# Patient Record
Sex: Male | Born: 1955 | Race: White | Hispanic: No | Smoking: Never smoker
Health system: Southern US, Community
[De-identification: ages and names within clinical notes are randomized; demographics above are authoritative.]

## PROBLEM LIST (undated history)

## (undated) DIAGNOSIS — G893 Neoplasm related pain (acute) (chronic): Secondary | ICD-10-CM

## (undated) DIAGNOSIS — I10 Essential (primary) hypertension: Secondary | ICD-10-CM

## (undated) DIAGNOSIS — M1990 Unspecified osteoarthritis, unspecified site: Secondary | ICD-10-CM

## (undated) DIAGNOSIS — E785 Hyperlipidemia, unspecified: Secondary | ICD-10-CM

## (undated) DIAGNOSIS — G20A1 Parkinson's disease without dyskinesia, without mention of fluctuations: Secondary | ICD-10-CM

## (undated) DIAGNOSIS — F419 Anxiety disorder, unspecified: Secondary | ICD-10-CM

## (undated) DIAGNOSIS — D649 Anemia, unspecified: Secondary | ICD-10-CM

## (undated) DIAGNOSIS — G5600 Carpal tunnel syndrome, unspecified upper limb: Secondary | ICD-10-CM

## (undated) DIAGNOSIS — F32A Depression, unspecified: Secondary | ICD-10-CM

## (undated) DIAGNOSIS — K08199 Complete loss of teeth due to other specified cause, unspecified class: Secondary | ICD-10-CM

## (undated) DIAGNOSIS — E119 Type 2 diabetes mellitus without complications: Secondary | ICD-10-CM

## (undated) DIAGNOSIS — G20 Parkinson's disease: Secondary | ICD-10-CM

## (undated) HISTORY — PX: MANDIBLE FRACTURE SURGERY: SHX706

## (undated) HISTORY — DX: Carpal tunnel syndrome, unspecified upper limb: G56.00

## (undated) HISTORY — PX: KNEE ARTHROSCOPY: SUR90

## (undated) HISTORY — DX: Neoplasm related pain (acute) (chronic): G89.3

## (undated) HISTORY — DX: Hyperlipidemia, unspecified: E78.5

## (undated) HISTORY — DX: Type 2 diabetes mellitus without complications: E11.9

## (undated) HISTORY — DX: Essential (primary) hypertension: I10

---

## 2003-11-19 DIAGNOSIS — G510 Bell's palsy: Secondary | ICD-10-CM | POA: Insufficient documentation

## 2010-11-09 DEATH — deceased

## 2012-10-26 DIAGNOSIS — L219 Seborrheic dermatitis, unspecified: Secondary | ICD-10-CM | POA: Insufficient documentation

## 2012-10-26 DIAGNOSIS — F419 Anxiety disorder, unspecified: Secondary | ICD-10-CM | POA: Insufficient documentation

## 2012-10-26 DIAGNOSIS — F4010 Social phobia, unspecified: Secondary | ICD-10-CM | POA: Insufficient documentation

## 2017-04-23 DIAGNOSIS — D0461 Carcinoma in situ of skin of right upper limb, including shoulder: Secondary | ICD-10-CM | POA: Insufficient documentation

## 2018-06-27 DIAGNOSIS — L309 Dermatitis, unspecified: Secondary | ICD-10-CM | POA: Insufficient documentation

## 2019-03-15 DIAGNOSIS — G5601 Carpal tunnel syndrome, right upper limb: Secondary | ICD-10-CM | POA: Insufficient documentation

## 2019-05-31 DIAGNOSIS — E785 Hyperlipidemia, unspecified: Secondary | ICD-10-CM | POA: Insufficient documentation

## 2019-05-31 DIAGNOSIS — Z872 Personal history of diseases of the skin and subcutaneous tissue: Secondary | ICD-10-CM | POA: Insufficient documentation

## 2019-05-31 DIAGNOSIS — N521 Erectile dysfunction due to diseases classified elsewhere: Secondary | ICD-10-CM | POA: Insufficient documentation

## 2019-10-30 ENCOUNTER — Other Ambulatory Visit: Payer: Self-pay | Admitting: Physical Medicine & Rehabilitation

## 2019-10-30 DIAGNOSIS — M546 Pain in thoracic spine: Secondary | ICD-10-CM

## 2019-10-30 DIAGNOSIS — M5414 Radiculopathy, thoracic region: Secondary | ICD-10-CM

## 2019-10-30 DIAGNOSIS — G8929 Other chronic pain: Secondary | ICD-10-CM

## 2019-11-11 ENCOUNTER — Ambulatory Visit: Payer: Self-pay

## 2019-12-14 DIAGNOSIS — M542 Cervicalgia: Secondary | ICD-10-CM | POA: Insufficient documentation

## 2019-12-14 DIAGNOSIS — M5412 Radiculopathy, cervical region: Secondary | ICD-10-CM | POA: Insufficient documentation

## 2020-01-18 DIAGNOSIS — M4804 Spinal stenosis, thoracic region: Secondary | ICD-10-CM | POA: Insufficient documentation

## 2020-03-06 DIAGNOSIS — F331 Major depressive disorder, recurrent, moderate: Secondary | ICD-10-CM | POA: Insufficient documentation

## 2020-08-21 LAB — COLOGUARD: COLOGUARD: NEGATIVE

## 2020-09-18 ENCOUNTER — Other Ambulatory Visit: Payer: Self-pay | Admitting: Thoracic Surgery

## 2020-09-18 DIAGNOSIS — M5136 Other intervertebral disc degeneration, lumbar region: Secondary | ICD-10-CM

## 2020-09-23 NOTE — Progress Notes (Signed)
Virtual Visit via Video Note  I connected with Ricky [name] on 09/24/20 at  2:00 PM EDT by a video enabled telemedicine application and verified that I am speaking with the correct person using two identifiers.  Location: Patient: home Provider: office Persons participated in the visit- patient, provider   I discussed the limitations of evaluation and management by telemedicine and the availability of in person appointments. The patient expressed understanding and agreed to proceed.   I discussed the assessment and treatment plan with the patient. The patient was provided an opportunity to ask questions and all were answered. The patient agreed with the plan and demonstrated an understanding of the instructions.   The patient was advised to call back or seek an in-person evaluation if the symptoms worsen or if the condition fails to improve as anticipated.  I provided 45 minutes of non-face-to-face time during this encounter.   Neysa Hotter, MD     Psychiatric Initial Adult Assessment   Patient Identification: Ricky [name] MRN:  [id number] Date of Evaluation:  09/24/2020 Referral Source: [name], MD Chief Complaint:   Chief Complaint    Follow-up; Depression    "I'm depressed all my life" Visit Diagnosis:    ICD-10-CM   1. MDD (major depressive disorder), recurrent episode, moderate (HCC)  F33.1     History of Present Illness:   [name] is a 65 y.o. year old male with a history of depression, anxiety, diabetes, hypertension, hyperlipidemia, carpal tunnel syndrome, s/p knee arthroscopy who is referred for depression.   Ricky [name], his fiance presents to the interview. Ricky [name] helps him to elaborate his history.  Ricky [name] states that Ricky [name] has been suffering from depression since 1979. (Ricky [name] later states that Ricky [name] is suffering from depression since born)  It got worse lately, although Ricky [name] cannot tell the reason.  Ricky [name] tends to stay in the house as Ricky [name] does not want to do anything.  Ricky [name] also  complains of back, knee pain, and pain secondary to carpal tunnel syndrome.  Ricky [name] had to quit his work last December due to pain.  Ricky [name] has been feeling frustrated about the situation.  Although Ricky [name] used to enjoy fishing and collecting guns in the past, Ricky [name] does not have any joy from those anymore.  Although Ricky [name] does go to a family gathering, Ricky [name] feels that everybody else has good time.  Ricky [name] has depressive symptoms as in PHQ-9.  Ricky [name] feels anxious and tense.  Ricky [name] feels like Ricky [name] will have panic attacks if Ricky [name] is unable to take clonazepam.  Although Ricky [name] voiced that Ricky [name] wishes to take clonazepam up to 4 times a day, Ricky [name] agrees that this medication would not be uptitrated due to the risk of dependence. Ricky [name] used to drink 12 beers a day. Ricky [name] "just quit" in 2019. Ricky [name] denies craving. Ricky [name] denies drug use or overuse of pain medication.   Ricky [name], his fianc?e presents to the interview.  Ricky [name] states that although Ricky [name] was doing better when Ricky [name] was started on duloxetine and Abilify, this medication almost stopped working.  His family could not believe it when Ricky [name] was able to do things such as planting plants, mowing the yard when Ricky [name] was doing better.  Ricky [name] also feels frustrated as Ricky [name] feels that his doctor is making fun of his pain condition.   Medication- duloxetine 60 mg daily for over an year, bupropion 150 mg daily, Abilify 30 mg daily (at least a few months), clonazepam 1 mg tid, trazodone 50 mg qhs  Daily routine: sit in the chair Exercise: none Employment: unemployed since Dec 2021. Quit due to pain, used to work as a Lawyer since age 58 Support: Ricky [name]/fiance, older son, his parents/age 20s, who live next door Household: fiance Marital status: married (divorced twice) Number of children: 3 children, age 94-41 Education: 12 th grade Ricky [name] was born and grew up in [place]. Ricky [name] has good relationship with his parents as a child, although Ricky [name] suffers from depression and anxiety "since born."  Associated  Signs/Symptoms: Depression Symptoms:  depressed mood, anhedonia, hypersomnia, fatigue, difficulty concentrating, (Hypo) Manic Symptoms:  denies decreased need for sleep, euphoria Anxiety Symptoms:  Excessive Worry, Psychotic Symptoms:  denies AH, VH, paranoia PTSD Symptoms: Negative  Past Psychiatric History:  Outpatient: depression since 1979. Never seen psychiatrist Psychiatry admission: denies Previous suicide attempt: denies  Past trials of medication: duloxetine, venlafaxine, Abilify, clonazepam,  History of violence:   Previous Psychotropic Medications: Yes   Substance Abuse History in the last 12 months:  No.  Consequences of Substance Abuse: NA  Past Medical History:  Past Medical History:  Diagnosis Date  . Carpal tunnel syndrome   . Diabetes (HCC)   . Hyperlipidemia   . Hypertension    History reviewed. No pertinent surgical history.  Family Psychiatric History: as below  Family History:  Family History  Problem Relation Age of Onset  . Depression Sister   . Depression Brother     Social History:   Social History   Socioeconomic History  . Marital status: Single    Spouse name: Not on file  . Number of children: Not on file  . Years of education: Not on file  . Highest education level: Not on file  Occupational History  . Not on file  Tobacco Use  . Smoking status: Not on file  . Smokeless tobacco: Not on file  Substance and Sexual Activity  . Alcohol use: Not on file  . Drug use: Not on file  . Sexual activity: Not on file  Other Topics Concern  . Not on file  Social History Narrative  . Not on file   Social Determinants of Health   Financial Resource Strain: Not on file  Food Insecurity: Not on file  Transportation Needs: Not on file  Physical Activity: Not on file  Stress: Not on file  Social Connections: Not on file    Additional Social History: as above  Allergies:  Not on File  Metabolic Disorder Labs: No results found  for: HGBA1C, MPG No results found for: PROLACTIN No results found for: CHOL, TRIG, HDL, CHOLHDL, VLDL, LDLCALC No results found for: TSH  Therapeutic Level Labs: No results found for: LITHIUM No results found for: CBMZ No results found for: VALPROATE  Current Medications: Current Outpatient Medications  Medication Sig Dispense Refill  . ARIPiprazole (ABILIFY) 30 MG tablet Take 30 mg by mouth daily.    Marland Kitchen buPROPion (WELLBUTRIN XL) 300 MG 24 hr tablet Take 1 tablet (300 mg total) by mouth daily. 30 tablet 1  . clonazePAM (KLONOPIN) 1 MG tablet Take 1 mg by mouth in the morning, at noon, and at bedtime.    . diclofenac (CATAFLAM) 50 MG tablet Take 75 mg by mouth daily as needed.    . DULoxetine (CYMBALTA) 60 MG capsule Take 60 mg by mouth daily.    Marland Kitchen etodolac (LODINE) 500 MG tablet Take 500 mg by mouth 2 (two) times daily.    . sildenafil (VIAGRA) 50 MG tablet Take  50 mg by mouth daily as needed.    . traZODone (DESYREL) 50 MG tablet Take 50 mg by mouth at bedtime.     No current facility-administered medications for this visit.    Musculoskeletal: Strength & Muscle Tone: Poor Gait & Station: N/A Patient leans: N/A  Psychiatric Specialty Exam: Review of Systems  Psychiatric/Behavioral: Positive for decreased concentration, dysphoric mood and sleep disturbance. Negative for agitation, behavioral problems, confusion, hallucinations, self-injury and suicidal ideas. The patient is nervous/anxious. The patient is not hyperactive.   All other systems reviewed and are negative.   There were no vitals taken for this visit.There is no height or weight on file to calculate BMI.  General Appearance: Fairly Groomed  Eye Contact:  Good  Speech:  Clear and Coherent  Volume:  Normal  Mood:  Depressed  Affect:  Appropriate, Congruent and Restricted  Thought Process:  Coherent  Orientation:  Full (Time, Place, and Person)  Thought Content:  Logical  Suicidal Thoughts:  No  Homicidal  Thoughts:  No  Memory:  Immediate;   Good  Judgement:  Good  Insight:  Fair  Psychomotor Activity:  Normal  Concentration:  Concentration: Good and Attention Span: Good  Recall:  Good  Fund of Knowledge:Good  Language: Good  Akathisia:  No  Handed:  Right  AIMS (if indicated):  not done  Assets:  Communication Skills Desire for Improvement  ADL's:  Intact  Cognition: WNL  Sleep:  Poor   Screenings: PHQ2-9   Flowsheet Row [facility]  PHQ-2 Total Score 6  PHQ-9 Total Score 19      Assessment and Plan:  Ricky LoopJames Hauser is a 65 y.o. year old male with a history of depression, anxiety, diabetes, hypertension, hyperlipidemia, carpal tunnel syndrome, s/p knee arthroscopy who is referred for depression.   1. MDD (major depressive disorder), recurrent episode, moderate (HCC) Ricky [name] reports worsening in depressive symptoms in the context of demoralization secondary to pain and unemployment.  Will uptitrate bupropion to optimize treatment for depression.  Discussed potential risk of headache and participation.  Will continue duloxetine and Abilify at the current dose at this time given Ricky [name] originally had good benefit from both of the medication.  Discussed potential risk from Abilify, which includes EPS and metabolic side effect.  Will continue clonazepam at the current dose with the plan to taper it off in the future.  Discussed potential risk of oversedation, dependence, tolerance especially given his history of alcohol use.  Ricky [name] will greatly benefit from CBT; will make referral.   # Insomnia Ricky [name] complains of hypersomnia, fatigue and occasional snoring.  Although Ricky [name] was recommended for sleep evaluation, Ricky [name] wants to hold this at this time.  Will continue trazodone at this time for insomnia.   Plan 1. Continue duloxeitne 60 mg daily  2. Increase bupropion 300 mg daily 3. Continue Abilify 30 mg daily  4. Continue clonazepam 1 mg three  times a day for anxiety- prescribed by PCP 5. Continue trazodone 50 mg at night  6. Next appointment: 6/21 at 10:30 for 30 mins  7. Referral to therapy   The patient demonstrates the following risk factors for suicide: Chronic risk factors for suicide include: psychiatric disorder of depression and chronic pain. Acute risk factors for suicide include: unemployment. Protective factors for this patient include: positive social support, coping skills and hope for the future. Considering these factors, the overall suicide risk at this point appears to be low. Patient is  appropriate for outpatient follow up.   Neysa Hotter, MD 5/17/20222:58 PM

## 2020-09-24 ENCOUNTER — Other Ambulatory Visit: Payer: Self-pay

## 2020-09-24 ENCOUNTER — Ambulatory Visit (INDEPENDENT_AMBULATORY_CARE_PROVIDER_SITE_OTHER): Payer: 59 | Admitting: Psychiatry

## 2020-09-24 ENCOUNTER — Encounter: Payer: Self-pay | Admitting: Psychiatry

## 2020-09-24 DIAGNOSIS — F331 Major depressive disorder, recurrent, moderate: Secondary | ICD-10-CM

## 2020-09-24 MED ORDER — BUPROPION HCL ER (XL) 300 MG PO TB24: 300.0000 mg | ORAL_TABLET | Freq: Every day | ORAL | 1 refills | Status: DC

## 2020-09-24 NOTE — Patient Instructions (Signed)
1. Continue duloxeitne 60 mg daily  2. Increase bupropion 300 mg daily 3. Continue Abilify 30 mg daily  4. Continue clonazepam 1 mg three times a day as needed for anxiety 5. Continue trazodone 50 mg at night  6. Next appointment: 6/21 at 10:30

## 2020-10-04 ENCOUNTER — Ambulatory Visit
Admission: RE | Admit: 2020-10-04 | Discharge: 2020-10-04 | Disposition: A | Discharge status: Home / Self Care | Payer: Disability Insurance | Attending: Thoracic Surgery | Admitting: Thoracic Surgery

## 2020-10-04 ENCOUNTER — Ambulatory Visit
Admission: RE | Admit: 2020-10-04 | Discharge: 2020-10-04 | Disposition: A | Discharge status: Home / Self Care | Payer: Disability Insurance | Source: Ambulatory Visit | Attending: Thoracic Surgery | Admitting: Thoracic Surgery

## 2020-10-04 DIAGNOSIS — M5136 Other intervertebral disc degeneration, lumbar region: Secondary | ICD-10-CM

## 2020-10-09 DIAGNOSIS — R200 Anesthesia of skin: Secondary | ICD-10-CM | POA: Insufficient documentation

## 2020-10-15 ENCOUNTER — Telehealth: Payer: Self-pay | Admitting: Student in an Organized Health Care Education/Training Program

## 2020-10-15 ENCOUNTER — Other Ambulatory Visit: Payer: Self-pay

## 2020-10-15 ENCOUNTER — Encounter: Payer: Self-pay | Admitting: Student in an Organized Health Care Education/Training Program

## 2020-10-15 ENCOUNTER — Ambulatory Visit
Payer: 59 | Attending: Student in an Organized Health Care Education/Training Program | Admitting: Student in an Organized Health Care Education/Training Program

## 2020-10-15 VITALS — BP 123/77 | HR 79 | Temp 97.2°F | Resp 14 | Ht 69.0 in | Wt 190.0 lb

## 2020-10-15 DIAGNOSIS — G8929 Other chronic pain: Secondary | ICD-10-CM | POA: Insufficient documentation

## 2020-10-15 DIAGNOSIS — G894 Chronic pain syndrome: Secondary | ICD-10-CM | POA: Diagnosis present

## 2020-10-15 DIAGNOSIS — M5414 Radiculopathy, thoracic region: Secondary | ICD-10-CM | POA: Diagnosis present

## 2020-10-15 DIAGNOSIS — M545 Low back pain, unspecified: Secondary | ICD-10-CM | POA: Diagnosis not present

## 2020-10-15 DIAGNOSIS — M546 Pain in thoracic spine: Secondary | ICD-10-CM | POA: Diagnosis present

## 2020-10-15 MED ORDER — GABAPENTIN 100 MG PO CAPS: ORAL_CAPSULE | ORAL | 0 refills | Status: DC

## 2020-10-15 NOTE — Progress Notes (Signed)
Safety precautions to be maintained throughout the outpatient stay will include: orient to surroundings, keep bed in low position, maintain call bell within reach at all times, provide assistance with transfer out of bed and ambulation.  

## 2020-10-15 NOTE — Telephone Encounter (Signed)
Patient's Fiance called asking about the gabapentin sent to pharmacy. States the patient tried this before with no results. I did explain that Dr. Cherylann Ratel has written this script to increase dosage as he goes along. She said they would try it. I ask her to call if any further questions.

## 2020-10-15 NOTE — Patient Instructions (Signed)

## 2020-10-15 NOTE — Progress Notes (Signed)
Patient: Ricky [name]  Service Category: E/M  Provider: [name], MD  DOB: 07-03-1955  DOS: 10/15/2020  Referring Provider: Adin Hector, MD  MRN: [id number]  Setting: Ambulatory outpatient  PCP: Adin Hector, MD  Type: New Patient  Specialty: Interventional Pain Management    Location: Office  Delivery: Face-to-face     Primary Reason(s) for Visit: Encounter for initial evaluation of one or more chronic problems (new to examiner) potentially causing chronic pain, and posing a threat to normal musculoskeletal function. (Level of risk: High) CC: Back Pain (Upper, lower)  HPI  Ricky [name] is a 65 y.o. year old, male patient, who comes for the first time to our practice referred by Adin Hector, MD for our initial evaluation of his chronic pain. He has Chronic bilateral thoracic back pain; Lumbar spine pain; Thoracic radiculitis; and Chronic pain syndrome on their problem list. Today he comes in for evaluation of his Back Pain (Upper, lower)  Pain Assessment: Location: Upper,Lower Back Radiating: both upper legs Onset: More than a month ago Duration: Chronic pain Quality: Aching,Dull Severity: 7 /10 (subjective, self-reported pain score)  Effect on ADL: difficulty performing daily activities Timing: Intermittent Modifying factors: sitting in recliner BP: 123/77  HR: 79  Onset and Duration: Gradual and Date of onset: more than 2 years ago Cause of pain: Work related accident or event Severity: Getting worse, NAS-11 at its worse: 10/10, NAS-11 at its best: 8/10, NAS-11 now: 8/10 and NAS-11 on the average: 8/10 Timing: Not influenced by the time of the day, During activity or exercise and After activity or exercise Aggravating Factors: Bending, Climbing, Intercourse (sex), Kneeling, Lifiting, Motion, Prolonged sitting, Prolonged standing, Squatting, Stooping , Twisting, Walking and Working Alleviating Factors: none Associated Problems: Depression, Erectile dysfunction, Fatigue,  Impotence, Inability to concentrate, Personality changes, Sadness, Weakness, Pain that wakes patient up and Pain that does not allow patient to sleep Quality of Pain: Agonizing, Constant, Disabling, Distressing, Exhausting, Getting longer, Horrible and Toothache-like Previous Examinations or Tests: EMG/PNCV, MRI scan and X-rays Previous Treatments: Epidural steroid injections, Physical Therapy and Stretching exercises  Ricky [name] is a pleasant 65 year old male who presents with a chief complaint of mid thoracic and upper lumbar spine pain that occasionally radiates out bilaterally.  Of note he has not done physical therapy in the past.  He has had 3 series of thoracic epidural steroid injections with Dr. Alba Destine with limited response.  He has tried meloxicam, Tylenol, naproxen, tramadol with limited benefit.  He does not recall having tried gabapentin in the past.  He does have a history of depression and anxiety for which she is on a host of psychotropic medications.  I advised against chronic opioid therapy in the context of him being on daily Klonopin which is a benzodiazepine that I explained to him.  I explained to the patient that I do not prescribe opioid medications to patients that are on benzodiazepines given risk of respiratory depression.  He denies any bowel or bladder issues.  Historic Controlled Substance Pharmacotherapy Review  Historical Monitoring: The patient  reports no history of drug use. List of all UDS Test(s): No results found for: MDMA, COCAINSCRNUR, [place], [place], CANNABQUANT, [place], [place] List of other Serum/Urine Drug Screening Test(s):  No results found for: AMPHSCRSER, BARBSCRSER, BENZOSCRSER, COCAINSCRSER, COCAINSCRNUR, PCPSCRSER, PCPQUANT, THCSCRSER, THCU, CANNABQUANT, OPIATESCRSER, OXYSCRSER, PROPOXSCRSER, ETH Historical Background Evaluation: [place] PMP: PDMP not reviewed this encounter. Online review of the past 57-monthperiod conducted.  [place] Department of  public safety, offender search: Editor, commissioning Information) Non-contributory Risk Assessment Profile: Aberrant behavior: None observed or detected today Risk factors for fatal opioid overdose: concomitant use of Benzodiazepines Fatal overdose hazard ratio (HR): Calculation deferred Non-fatal overdose hazard ratio (HR): Calculation deferred Risk of opioid abuse or dependence: 0.7-3.0% with doses ? 36 MME/day and 6.1-26% with doses ? 120 MME/day. Substance use disorder (SUD) risk level: See below Personal History of Substance Abuse (SUD-Substance use disorder):  Alcohol: Negative  Illegal Drugs: Negative  Rx Drugs: Negative  ORT Risk Level calculation: Low Risk  Opioid Risk Tool - 10/15/20 1355      Family History of Substance Abuse   Alcohol Negative    Illegal Drugs Negative    Rx Drugs Negative      Personal History of Substance Abuse   Alcohol Negative    Illegal Drugs Negative    Rx Drugs Negative      Age   Age between 53-45 years  No      History of Preadolescent Sexual Abuse   History of Preadolescent Sexual Abuse Negative or Male      Psychological Disease   Psychological Disease Negative    Depression Negative      Total Score   Opioid Risk Tool Scoring 0    Opioid Risk Interpretation Low Risk          ORT Scoring interpretation table:  Score <3 = Low Risk for SUD  Score between 4-7 = Moderate Risk for SUD  Score >8 = High Risk for Opioid Abuse   PHQ-2 Depression Scale:  Total score: 0  PHQ-2 Scoring interpretation table: (Score and probability of major depressive disorder)  Score 0 = No depression  Score 1 = 15.4% Probability  Score 2 = 21.1% Probability  Score 3 = 38.4% Probability  Score 4 = 45.5% Probability  Score 5 = 56.4% Probability  Score 6 = 78.6% Probability   PHQ-9 Depression Scale:  Total score: 0  PHQ-9 Scoring interpretation table:  Score 0-4 = No depression  Score 5-9 = Mild depression  Score 10-14 = Moderate depression  Score 15-19 =  Moderately severe depression  Score 20-27 = Severe depression (2.4 times higher risk of SUD and 2.89 times higher risk of overuse)   Pharmacologic Plan: As per protocol, I have not taken over any controlled substance management, pending the results of ordered tests and/or consults.            Initial impression: Inform patient that I do not prescribe opioid therapy to patients that are on benzodiazepine therapy.  Patient is currently on Klonopin 3 times a day.  In addition to other psychotropic medications.  Meds   Current Outpatient Medications:  .  ARIPiprazole (ABILIFY) 30 MG tablet, Take 30 mg by mouth daily., Disp: , Rfl:  .  atenolol (TENORMIN) 50 MG tablet, Take 50 mg by mouth daily., Disp: , Rfl:  .  buPROPion (WELLBUTRIN XL) 300 MG 24 hr tablet, Take 1 tablet (300 mg total) by mouth daily., Disp: 30 tablet, Rfl: 1 .  buPROPion (ZYBAN) 150 MG 12 hr tablet, Take 150 mg by mouth daily., Disp: , Rfl:  .  chlorthalidone (HYGROTON) 25 MG tablet, Take 25 mg by mouth daily., Disp: , Rfl:  .  clonazePAM (KLONOPIN) 1 MG tablet, Take 1 mg by mouth in the morning, at noon, and at bedtime., Disp: , Rfl:  .  DULoxetine (CYMBALTA) 60 MG capsule, Take 60 mg by mouth daily.,  Disp: , Rfl:  .  enalapril (VASOTEC) 10 MG tablet, Take by mouth., Disp: , Rfl:  .  etodolac (LODINE) 500 MG tablet, Take 500 mg by mouth 2 (two) times daily., Disp: , Rfl:  .  ezetimibe (ZETIA) 10 MG tablet, Take 10 mg by mouth daily., Disp: , Rfl:  .  gabapentin (NEURONTIN) 100 MG capsule, Take 1 capsule (100 mg total) by mouth at bedtime for 7 days, THEN 2 capsules (200 mg total) at bedtime for 7 days, THEN 3 capsules (300 mg total) at bedtime. Follow written titration schedule., Disp: 159 capsule, Rfl: 0 .  ketoconazole (NIZORAL) 2 % shampoo, Apply topically., Disp: , Rfl:  .  metFORMIN (GLUCOPHAGE) 500 MG tablet, Take 2 tablets by mouth 2 (two) times daily., Disp: , Rfl:  .  simvastatin (ZOCOR) 20 MG tablet, Take 20 mg by  mouth at bedtime., Disp: , Rfl:  .  traZODone (DESYREL) 50 MG tablet, Take 50 mg by mouth at bedtime., Disp: , Rfl:  .  VICTOZA 18 MG/3ML SOPN, Inject 1.2 mg into the skin daily., Disp: , Rfl:  .  diclofenac (CATAFLAM) 50 MG tablet, Take 75 mg by mouth daily as needed. (Patient not taking: Reported on 10/15/2020), Disp: , Rfl:  .  sildenafil (VIAGRA) 50 MG tablet, Take 50 mg by mouth daily as needed. (Patient not taking: Reported on 10/15/2020), Disp: , Rfl:   Imaging Review  Narrative CLINICAL DATA:  Degenerative disc disease changes lumbar spine  EXAM: LUMBAR SPINE - 2-3 VIEW  COMPARISON:  None  FINDINGS: Mild osseous demineralization.  Five non-rib-bearing lumbar vertebra.  Slight disc space narrowing L5-S1 with minimal retrolisthesis.  Scattered tiny endplate spurs.  Vertebral body heights maintained without fracture or additional subluxation.  No bone destruction or gross spondylolysis.  SI joints symmetric.  IMPRESSION: Mild degenerative disc disease changes, greatest at L5-S1 with minimal retrolisthesis.   Electronically Signed By: Lavonia Dana M.D. On: 10/06/2020 11:13   Complexity Note: Imaging results reviewed. Results shared with Ricky [name], using Layman's terms.                         ROS  Cardiovascular: High blood pressure Pulmonary or Respiratory: No reported pulmonary signs or symptoms such as wheezing and difficulty taking a deep full breath (Asthma), difficulty blowing air out (Emphysema), coughing up mucus (Bronchitis), persistent dry cough, or temporary stoppage of breathing during sleep Neurological: No reported neurological signs or symptoms such as seizures, abnormal skin sensations, urinary and/or fecal incontinence, being born with an abnormal open spine and/or a tethered spinal cord Psychological-Psychiatric: Anxiousness, Depressed, Prone to panicking and Difficulty sleeping and or falling asleep Gastrointestinal: No reported gastrointestinal  signs or symptoms such as vomiting or evacuating blood, reflux, heartburn, alternating episodes of diarrhea and constipation, inflamed or scarred liver, or pancreas or irrregular and/or infrequent bowel movements Genitourinary: No reported renal or genitourinary signs or symptoms such as difficulty voiding or producing urine, peeing blood, non-functioning kidney, kidney stones, difficulty emptying the bladder, difficulty controlling the flow of urine, or chronic kidney disease Hematological: No reported hematological signs or symptoms such as prolonged bleeding, low or poor functioning platelets, bruising or bleeding easily, hereditary bleeding problems, low energy levels due to low hemoglobin or being anemic Endocrine: High blood sugar requiring insulin (IDDM) Rheumatologic: Joint aches and or swelling due to excess weight (Osteoarthritis) Musculoskeletal: Negative for myasthenia gravis, muscular dystrophy, multiple sclerosis or malignant hyperthermia Work History: Disabled  Allergies  Ricky [name] is allergic to mirtazapine.  Laboratory Chemistry Profile   Renal No results found for: BUN, CREATININE, LABCREA, BCR, GFR, GFRAA, GFRNONAA, SPECGRAV, PHUR, PROTEINUR   Electrolytes No results found for: NA, K, CL, CALCIUM, MG, PHOS   Hepatic No results found for: AST, ALT, ALBUMIN, ALKPHOS, AMYLASE, LIPASE, AMMONIA   ID No results found for: LYMEIGGIGMAB, HIV, SARSCOV2NAA, STAPHAUREUS, MRSAPCR, HCVAB, PREGTESTUR, RMSFIGG, QFVRPH1IGG, QFVRPH2IGG, LYMEIGGIGMAB   Bone No results found for: VD25OH, [id number], IZ1245YK9, [id number], 25OHVITD1, 25OHVITD2, 25OHVITD3, TESTOFREE, TESTOSTERONE   Endocrine No results found for: GLUCOSE, GLUCOSEU, HGBA1C, TSH, FREET4, TESTOFREE, TESTOSTERONE, SHBG, ESTRADIOL, ESTRADIOLPCT, ESTRADIOLFRE, LABPREG, ACTH, CRTSLPL, UCORFRPERLTR, UCORFRPERDAY, CORTISOLBASE, LABPREG   Neuropathy No results found for: VITAMINB12, FOLATE, HGBA1C, HIV   CNS No results found for:  COLORCSF, APPEARCSF, RBCCOUNTCSF, WBCCSF, POLYSCSF, LYMPHSCSF, EOSCSF, PROTEINCSF, GLUCCSF, JCVIRUS, CSFOLI, IGGCSF, LABACHR, ACETBL, LABACHR, ACETBL   Inflammation (CRP: Acute  ESR: Chronic) No results found for: CRP, ESRSEDRATE, LATICACIDVEN   Rheumatology No results found for: RF, ANA, LABURIC, URICUR, LYMEIGGIGMAB, LYMEABIGMQN, HLAB27   Coagulation No results found for: INR, LABPROT, APTT, PLT, DDIMER, LABHEMA, VITAMINK1, AT3   Cardiovascular No results found for: BNP, CKTOTAL, CKMB, TROPONINI, HGB, HCT, LABVMA, EPIRU, EPINEPH24HUR, NOREPRU, NOREPI24HUR, DOPARU, DOPAM24HRUR   Screening No results found for: SARSCOV2NAA, COVIDSOURCE, STAPHAUREUS, MRSAPCR, HCVAB, HIV, PREGTESTUR   Cancer No results found for: CEA, CA125, LABCA2   Allergens No results found for: ALMOND, APPLE, ASPARAGUS, AVOCADO, BANANA, BARLEY, BASIL, B[place], GREENBEAN, LIMABEAN, WHITEBEAN, BEEFIGE, REDBEET, BLUEBERRY, BROCCOLI, CABBAGE, MELON, CARROT, CASEIN, CASHEWNUT, CAULIFLOWER, CELERY     Note: Lab results reviewed.  [place]  Drug: Ricky [name]  reports no history of drug use. Alcohol:  reports no history of alcohol use. Tobacco:  reports that he has never smoked. He has never used smokeless tobacco. Medical:  has a past medical history of Carpal tunnel syndrome, Diabetes ([place]), Hyperlipidemia, and Hypertension. Family: family history includes Depression in his brother and sister.  No past surgical history on file. Active Ambulatory Problems    Diagnosis Date Noted  . Chronic bilateral thoracic back pain 10/15/2020  . Lumbar spine pain 10/15/2020  . Thoracic radiculitis 10/15/2020  . Chronic pain syndrome 10/15/2020   Resolved Ambulatory Problems    Diagnosis Date Noted  . No Resolved Ambulatory Problems   Past Medical History:  Diagnosis Date  . Carpal tunnel syndrome   . Diabetes ([place])   . Hyperlipidemia   . Hypertension    Constitutional Exam  General appearance: Well nourished, well  developed, and well hydrated. In no apparent acute distress Vitals:   10/15/20 1346  BP: 123/77  Pulse: 79  Resp: 14  Temp: (!) 97.2 F (36.2 C)  TempSrc: Temporal  SpO2: 96%  Weight: 190 lb (86.2 kg)  Height: '5\' 9"'  (1.753 m)   BMI Assessment: Estimated body mass index is 28.06 kg/m as calculated from the following:   Height as of this encounter: '5\' 9"'  (1.753 m).   Weight as of this encounter: 190 lb (86.2 kg).  BMI interpretation table: BMI level Category Range association with higher incidence of chronic pain  <18 kg/m2 Underweight   18.5-24.9 kg/m2 Ideal body weight   25-29.9 kg/m2 Overweight Increased incidence by 20%  30-34.9 kg/m2 Obese (Class I) Increased incidence by 68%  35-39.9 kg/m2 Severe obesity (Class II) Increased incidence by 136%  >40 kg/m2 Extreme obesity (Class III) Increased incidence by 254%   Patient's current BMI Ideal Body weight  Body mass index is 28.06 kg/m. Ideal body  weight: 70.7 kg (155 lb 13.8 oz) Adjusted ideal body weight: 76.9 kg (169 lb 8.3 oz)   BMI Readings from Last 4 Encounters:  10/15/20 28.06 kg/m   Wt Readings from Last 4 Encounters:  10/15/20 190 lb (86.2 kg)    Psych/Mental status: Alert, oriented x 3 (person, place, & time)       Eyes: PERLA Respiratory: No evidence of acute respiratory distress  Mid thoracic pain most pronounced in T10-T12 region Pain with lumbar extension and facet loading 5 out of 5 strength bilateral lower extremity: Plantar flexion, dorsiflexion, knee flexion, knee extension.   Assessment  Primary Diagnosis & Pertinent Problem List: The primary encounter diagnosis was Chronic bilateral thoracic back pain. Diagnoses of Lumbar spine pain, Thoracic radiculitis, and Chronic pain syndrome were also pertinent to this visit.  Visit Diagnosis (New problems to examiner): 1. Chronic bilateral thoracic back pain   2. Lumbar spine pain   3. Thoracic radiculitis   4. Chronic pain syndrome    Plan of Care  (Initial workup plan)  Note: Ricky [name] was reminded that as per protocol, today's visit has been an evaluation only. We have not taken over the patient's controlled substance management.   Lab Orders     Compliance Drug Analysis, Ur  Referral Orders     Ambulatory referral to Physical Therapy Pharmacotherapy (current): Medications ordered:  Meds ordered this encounter  Medications  . gabapentin (NEURONTIN) 100 MG capsule    Sig: Take 1 capsule (100 mg total) by mouth at bedtime for 7 days, THEN 2 capsules (200 mg total) at bedtime for 7 days, THEN 3 capsules (300 mg total) at bedtime. Follow written titration schedule.    Dispense:  159 capsule    Refill:  0    Fill one day early if pharmacy is closed on scheduled refill date. May substitute for generic if available.   Medications administered during this visit: Ricky [name] "Phil" had no medications administered during this visit.   Pharmacological management options:  Opioid Analgesics: Not a candidate, patient is on Klonopin  Membrane stabilizer: Trial of gabapentin.  Future considerations include Lyrica  Muscle relaxant: To be determined at a later time  NSAID: Has tried etodolac, naproxen, ibuprofen  Other analgesic(s): To be determined at a later time   Interventional management options: Ricky [name] was informed that there is no guarantee that he would be a candidate for interventional therapies. The decision will be based on the results of diagnostic studies, as well as Ricky [name]'s risk profile.  Procedure(s) under consideration:  Thoracic facet medial branch nerve blocks  Duration of clinical encounter: 45 minutes Provider-requested follow-up: Return in about 8 weeks (around 12/10/2020) for Medication Management, in person.  Future Appointments  Date Time Provider [place]  10/29/2020 10:30 AM Norman Clay, MD [facility] None  11/07/2020 10:00 AM Conception Chancy [facility]  11/14/2020  9:00 AM Conception Chancy  [facility]  12/05/2020  2:40 PM [name], MD [facility] None    Note by: [name], MD Date: 10/15/2020; Time: 2:46 PM

## 2020-10-22 ENCOUNTER — Other Ambulatory Visit: Payer: Self-pay | Admitting: Student in an Organized Health Care Education/Training Program

## 2020-10-22 ENCOUNTER — Other Ambulatory Visit: Payer: Self-pay | Admitting: *Deleted

## 2020-10-26 NOTE — Progress Notes (Signed)
Virtual Visit via Telephone Note  I connected with Ricky [name] on 10/29/20 at 10:30 AM EDT by telephone and verified that I am speaking with the correct person using two identifiers.  Location: Patient: home Provider: office Persons participated in the visit- patient, provider    I discussed the limitations, risks, security and privacy concerns of performing an evaluation and management service by telephone and the availability of in person appointments. I also discussed with the patient that there may be a patient responsible charge related to this service. The patient expressed understanding and agreed to proceed.    I discussed the assessment and treatment plan with the patient. The patient was provided an opportunity to ask questions and all were answered. The patient agreed with the plan and demonstrated an understanding of the instructions.   The patient was advised to call back or seek an in-person evaluation if the symptoms worsen or if the condition fails to improve as anticipated.  I provided 13 minutes of non-face-to-face time during this encounter.   Neysa Hotter, MD    [facility] MD/PA/NP OP Progress Note  10/29/2020 11:10 AM Ricky [name]  MRN:  [id number]  Chief Complaint:  Chief Complaint   Follow-up; Depression    HPI:  - He was seen by Dr. Cherylann Ratel. He was started on gabapentin for pain This is a follow-up appointment for depression.  He states that he is not quite as depressed, and thinks bupropion might have been helping some.  However, he still does not want to get out.  He tends to just sit down at night during the day.  He denies significant concerns about his pain at this time, stating that he does not have it a lot when he does not do anything.  Although he had slight worsening in pain when he worked in the yard, he was able to manage it well.  He sleeps better.  He has fair appetite.  He denies any weight loss since the last visit, although he did have 10 pounds  weight loss in the past.  He feels fatigue.  He denies SI.  He denies alcohol use or drug use.  He is willing to try a higher dose of bupropion.     Daily routine: sit in the chair Exercise: none Employment: unemployed since Dec 2021. Quit due to pain, used to work as a Lawyer since age 18 Support: [name]/fiance, older son, his parents/age 60s, who live next door Household: fiance Marital status: married (divorced twice) Number of children: 3 children, age 30-41 Education: 12 th grade He was born and grew up in [place]. He has good relationship with his parents as a child, although he suffers from depression and anxiety "since born."  Visit Diagnosis:    ICD-10-CM   1. MDD (major depressive disorder), recurrent episode, moderate (HCC)  F33.1     2. Alcohol use disorder, moderate, in sustained remission (HCC)  F10.21       Past Psychiatric History: Please see initial evaluation for full details. I have reviewed the history. No updates at this time.     Past Medical History:  Past Medical History:  Diagnosis Date   Carpal tunnel syndrome    Diabetes (HCC)    Hyperlipidemia    Hypertension    No past surgical history on file.  Family Psychiatric History: Please see initial evaluation for full details. I have reviewed the history. No updates at this time.     Family History:  Family  History  Problem Relation Age of Onset   Depression Sister    Depression Brother     Social History:  Social History   Socioeconomic History   Marital status: Single    Spouse name: Not on file   Number of children: Not on file   Years of education: Not on file   Highest education level: Not on file  Occupational History   Not on file  Tobacco Use   Smoking status: Never   Smokeless tobacco: Never  Substance and Sexual Activity   Alcohol use: Never   Drug use: Never   Sexual activity: Not on file  Other Topics Concern   Not on file  Social History Narrative    Not on file   Social Determinants of Health   Financial Resource Strain: Not on file  Food Insecurity: Not on file  Transportation Needs: Not on file  Physical Activity: Not on file  Stress: Not on file  Social Connections: Not on file    Allergies:  Allergies  Allergen Reactions   Mirtazapine Rash    Metabolic Disorder Labs: No results found for: HGBA1C, MPG No results found for: PROLACTIN No results found for: CHOL, TRIG, HDL, CHOLHDL, VLDL, LDLCALC No results found for: TSH  Therapeutic Level Labs: No results found for: LITHIUM No results found for: VALPROATE No components found for:  CBMZ  Current Medications: Current Outpatient Medications  Medication Sig Dispense Refill   buPROPion (WELLBUTRIN XL) 150 MG 24 hr tablet Total of 450 mg daily. Take along with 300 mg tab 30 tablet 0   ARIPiprazole (ABILIFY) 30 MG tablet Take 30 mg by mouth daily.     atenolol (TENORMIN) 50 MG tablet Take 50 mg by mouth daily.     buPROPion (WELLBUTRIN XL) 300 MG 24 hr tablet Take 1 tablet (300 mg total) by mouth daily. 30 tablet 1   chlorthalidone (HYGROTON) 25 MG tablet Take 25 mg by mouth daily.     clonazePAM (KLONOPIN) 1 MG tablet Take 1 mg by mouth in the morning, at noon, and at bedtime.     diclofenac (CATAFLAM) 50 MG tablet Take 75 mg by mouth daily as needed. (Patient not taking: Reported on 10/15/2020)     DULoxetine (CYMBALTA) 60 MG capsule Take 120 mg by mouth daily.     enalapril (VASOTEC) 10 MG tablet Take by mouth.     etodolac (LODINE) 500 MG tablet Take 500 mg by mouth 2 (two) times daily.     ezetimibe (ZETIA) 10 MG tablet Take 10 mg by mouth daily.     gabapentin (NEURONTIN) 100 MG capsule Take 1 capsule (100 mg total) by mouth at bedtime for 7 days, THEN 2 capsules (200 mg total) at bedtime for 7 days, THEN 3 capsules (300 mg total) at bedtime. Follow written titration schedule. 159 capsule 0   ketoconazole (NIZORAL) 2 % shampoo Apply topically.     metFORMIN  (GLUCOPHAGE) 500 MG tablet Take 2 tablets by mouth 2 (two) times daily.     sildenafil (VIAGRA) 50 MG tablet Take 50 mg by mouth daily as needed. (Patient not taking: Reported on 10/15/2020)     simvastatin (ZOCOR) 20 MG tablet Take 20 mg by mouth at bedtime.     traZODone (DESYREL) 50 MG tablet Take 50 mg by mouth at bedtime.     VICTOZA 18 MG/3ML SOPN Inject 1.2 mg into the skin daily.     No current facility-administered medications for this visit.  Musculoskeletal: Strength & Muscle Tone:  N/A Gait & Station:  N/A Patient leans: N/A  Psychiatric Specialty Exam: Review of Systems  Psychiatric/Behavioral:  Positive for dysphoric mood. Negative for agitation, behavioral problems, confusion, decreased concentration, hallucinations, self-injury, sleep disturbance and suicidal ideas. The patient is nervous/anxious. The patient is not hyperactive.   All other systems reviewed and are negative.  There were no vitals taken for this visit.There is no height or weight on file to calculate BMI.  General Appearance: NA  Eye Contact:  NA  Speech:  Clear and Coherent  Volume:  Normal  Mood:  Depressed  Affect:  NA  Thought Process:  Coherent  Orientation:  Full (Time, Place, and Person)  Thought Content: Logical   Suicidal Thoughts:  No  Homicidal Thoughts:  No  Memory:  Immediate;   Good  Judgement:  Good  Insight:  Present  Psychomotor Activity:  Normal  Concentration:  Concentration: Good and Attention Span: Good  Recall:  Good  Fund of Knowledge: Good  Language: Good  Akathisia:  No  Handed:  Right  AIMS (if indicated): not done  Assets:  Communication Skills Desire for Improvement  ADL's:  Intact  Cognition: WNL  Sleep:  Good   Screenings: PHQ2-9    Flowsheet Row Office Visit from 10/15/2020 in [facility] Office Visit from 09/24/2020 in [facility]  PHQ-2 Total Score 0 6  PHQ-9 Total Score -- 19       Flowsheet Row Video Visit from 10/29/2020 in [facility]  C-SSRS RISK CATEGORY No Risk        Assessment and Plan:  Ricky [name] is a 65 y.o. year old male with a history of depression, anxiety, diabetes, hypertension, hyperlipidemia, carpal tunnel syndrome, s/p knee arthroscopy , who presents for follow up appointment for below.   1. MDD (major depressive disorder), recurrent episode, moderate (HCC) Although he continues to struggle with depressive symptoms, there has been slight improvement since up titration of bupropion.  Psychosocial stressors includes demoralization secondary to pain and unemployment.  We do further up titration of bupropion to optimize treatment for depression.  Will continue duloxetine and Abilify for depression given he reports good benefit from both of the medication.  Noted that he has been on clonazepam, prescribed by his PCP.  He is recommended to be tapered off this medication in the future especially given his history of alcohol use.   # Alcohol use disorder in sustained remission He denies any alcohol use/craving for alcohol.  Will continue motivational interview.   # Insomnia He complains of hypersomnia, fatigue and occasional snoring.  Although he was recommended for sleep evaluation, he wants to hold this at this time.  Will continue trazodone at this time for insomnia.   This clinician has discussed the side effect associated with medication prescribed during this encounter. Please refer to notes in the previous encounters for more details.     Plan 1. Continue duloxeitne 120 mg daily  2. Increase bupropion 450 mg daily 3. Continue Abilify 30 mg daily 4. Continue clonazepam 1 mg three times a day for anxiety- prescribed by PCP 5. Continue trazodone 50 mg at night 6. Next appointment: 7/18 at 8 AM for in person visit  7. Referral to therapy    The patient demonstrates the following risk factors for suicide: Chronic  risk factors for suicide include: psychiatric disorder of depression and chronic pain. Acute risk factors for suicide include: unemployment.  Protective factors for this patient include: positive social support, coping skills and hope for the future. Considering these factors, the overall suicide risk at this point appears to be low. Patient is appropriate for outpatient follow up.       Neysa Hottereina Shenique Childers, MD 10/29/2020, 11:10 AM

## 2020-10-29 ENCOUNTER — Other Ambulatory Visit: Payer: Self-pay

## 2020-10-29 ENCOUNTER — Telehealth (INDEPENDENT_AMBULATORY_CARE_PROVIDER_SITE_OTHER): Payer: 59 | Admitting: Psychiatry

## 2020-10-29 ENCOUNTER — Encounter: Payer: Self-pay | Admitting: Psychiatry

## 2020-10-29 DIAGNOSIS — F1021 Alcohol dependence, in remission: Secondary | ICD-10-CM

## 2020-10-29 DIAGNOSIS — F331 Major depressive disorder, recurrent, moderate: Secondary | ICD-10-CM

## 2020-10-29 MED ORDER — BUPROPION HCL ER (XL) 150 MG PO TB24: ORAL_TABLET | ORAL | 0 refills | Status: DC

## 2020-10-29 NOTE — Patient Instructions (Signed)
1. Continue duloxeitne 120 mg daily  2. Increase bupropion 450 mg daily 3. Continue Abilify 30 mg daily 4. Continue clonazepam 1 mg three times a day for anxiety 5. Continue trazodone 50 mg at night 6. Next appointment: 7/18 at 8 AM for in person visit

## 2020-10-30 LAB — COMPLIANCE DRUG ANALYSIS, UR

## 2020-11-07 ENCOUNTER — Ambulatory Visit (INDEPENDENT_AMBULATORY_CARE_PROVIDER_SITE_OTHER): Payer: 59 | Admitting: Psychologist

## 2020-11-07 ENCOUNTER — Other Ambulatory Visit: Payer: Self-pay

## 2020-11-07 DIAGNOSIS — F331 Major depressive disorder, recurrent, moderate: Secondary | ICD-10-CM | POA: Diagnosis not present

## 2020-11-07 DIAGNOSIS — F411 Generalized anxiety disorder: Secondary | ICD-10-CM | POA: Diagnosis not present

## 2020-11-08 DIAGNOSIS — G5602 Carpal tunnel syndrome, left upper limb: Secondary | ICD-10-CM | POA: Insufficient documentation

## 2020-11-14 ENCOUNTER — Ambulatory Visit (INDEPENDENT_AMBULATORY_CARE_PROVIDER_SITE_OTHER): Payer: 59 | Admitting: Psychologist

## 2020-11-14 ENCOUNTER — Other Ambulatory Visit: Payer: Self-pay | Admitting: Surgery

## 2020-11-14 DIAGNOSIS — F411 Generalized anxiety disorder: Secondary | ICD-10-CM | POA: Diagnosis not present

## 2020-11-14 DIAGNOSIS — F331 Major depressive disorder, recurrent, moderate: Secondary | ICD-10-CM | POA: Diagnosis not present

## 2020-11-19 NOTE — Progress Notes (Signed)
[facility] MD/PA/NP OP Progress Note  11/25/2020 8:49 AM Ricky [name]  MRN:  [id number]  Chief Complaint:  Chief Complaint   Follow-up; Depression    HPI:  This is a follow up appointment for depression, anxiety.  He states that his mood has been better. However, he is not able to return to work yet due to his anxiety. He complains of random racing thoughts when he tries to do something. Although he used to enjoy planting flowers, he does not have any desire to do this with his wife. Although he does have pain, he hopes those would improve after surgery for his carpel tunnel. He has fair sleep. He continues to feel depressed and has anhedonia. He denies change in appetite. He has difficulty in concentration. He denies SI. He reports anxiety,and feeling tense at times. He denies panic attacks. He denies SI. He denies alcohol use/craving.   Wt Readings from Last 3 Encounters:  11/25/20 196 lb 6.4 oz (89.1 kg)  10/15/20 190 lb (86.2 kg)      Daily routine: sit in the chair Exercise: none Employment: unemployed since Dec 2021. 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 He has good relationship with his parents as a child, although he suffers from depression and anxiety "since born."  Wt Readings from Last 3 Encounters:  11/25/20 196 lb 6.4 oz (89.1 kg)  10/15/20 190 lb (86.2 kg)     Visit Diagnosis:    ICD-10-CM   1. MDD (major depressive disorder), recurrent episode, moderate (HCC)  F33.1     2. Insomnia, unspecified type  G47.00     3. Anxiety state  F41.1       Past Psychiatric History: Please see initial evaluation for full details. I have reviewed the history. No updates at this time.     Past Medical History:   Past Medical History:  Diagnosis Date   Anxiety    Carpal tunnel syndrome    Depression    Diabetes (HCC)    Hyperlipidemia    Hypertension    History reviewed. No pertinent surgical history.  Family Psychiatric History: Please see initial evaluation for full details. I have reviewed the history. No updates at this time.     Family History:  Family History  Problem Relation Age of Onset   Depression Sister    Depression Brother     Social History:  Social History   Socioeconomic History   Marital status: Significant Other    Spouse name: [name]   Number of children: Not on file   Years of education: Not on file   Highest education level: Not on file  Occupational History   Not on file  Tobacco Use   Smoking status: Never   Smokeless tobacco: Never  Substance and Sexual Activity   Alcohol use: Never   Drug use: Never   Sexual activity: Not on file  Other Topics Concern   Not on file  Social History Narrative   Lives with friend   Social Determinants of Health   Financial Resource Strain: Not on file  Food Insecurity: Not on file  Transportation Needs: Not on file  Physical Activity: Not on file  Stress: Not on file  Social Connections: Not on  file    Allergies:  Allergies  Allergen Reactions   Mirtazapine Rash    Metabolic Disorder Labs: No results found for: HGBA1C, MPG No results found for: PROLACTIN No results found for: CHOL, TRIG, HDL, CHOLHDL, VLDL, LDLCALC No results found for: TSH  Therapeutic Level Labs: No results found for: LITHIUM No results found for: VALPROATE No components found for:  CBMZ  Current Medications: Current Outpatient Medications  Medication Sig Dispense Refill   ARIPiprazole (ABILIFY) 30 MG tablet Take 30 mg by mouth daily.     atenolol (TENORMIN) 50 MG tablet Take 50 mg by mouth daily.     chlorthalidone (HYGROTON) 25 MG tablet Take 25 mg by mouth daily.     clonazePAM (KLONOPIN) 1 MG tablet Take 1 mg by mouth in  the morning, at noon, and at bedtime.     DULoxetine (CYMBALTA) 60 MG capsule Take 60 mg by mouth daily.     enalapril (VASOTEC) 10 MG tablet Take 10 mg by mouth daily.     etodolac (LODINE) 500 MG tablet Take 500 mg by mouth 2 (two) times daily.     ezetimibe (ZETIA) 10 MG tablet Take 10 mg by mouth daily.     gabapentin (NEURONTIN) 100 MG capsule Take 1 capsule (100 mg total) by mouth at bedtime for 7 days, THEN 2 capsules (200 mg total) at bedtime for 7 days, THEN 3 capsules (300 mg total) at bedtime. Follow written titration schedule. (Patient taking differently: Take 300 mg at bedtime) 159 capsule 0   ketoconazole (NIZORAL) 2 % shampoo Apply 1 application topically every 3 (three) days.     metFORMIN (GLUCOPHAGE) 500 MG tablet Take 500 mg by mouth 2 (two) times daily.     sildenafil (VIAGRA) 50 MG tablet Take 50 mg by mouth daily as needed for erectile dysfunction.     simvastatin (ZOCOR) 20 MG tablet Take 20 mg by mouth daily.     traZODone (DESYREL) 50 MG tablet Take 50 mg by mouth at bedtime.     VICTOZA 18 MG/3ML SOPN Inject 1.2 mg into the skin daily.     buPROPion (WELLBUTRIN XL) 150 MG 24 hr tablet Total of 450 mg daily. Take along with 300 mg tab 30 tablet 2   buPROPion (WELLBUTRIN XL) 300 MG 24 hr tablet Total of 450 mg daily. Take along with 150 mg tab 30 tablet 2   No current facility-administered medications for this visit.     Musculoskeletal: Strength & Muscle Tone: within normal limits Gait & Station: normal Patient leans: N/A  Psychiatric Specialty Exam: Review of Systems  Psychiatric/Behavioral:  Positive for dysphoric mood. Negative for agitation, behavioral problems, confusion, decreased concentration, hallucinations, self-injury, sleep disturbance and suicidal ideas. The patient is nervous/anxious. The patient is not hyperactive.   All other systems reviewed and are negative.  Blood pressure 123/78, pulse 80, temperature 97.9 F (36.6 C), temperature source  Temporal, weight 196 lb 6.4 oz (89.1 kg).Body mass index is 29 kg/m.  General Appearance: Fairly Groomed  Eye Contact:  Good  Speech:  Clear and Coherent  Volume:  Normal  Mood:   better  Affect:  Appropriate, Congruent, and slightly restricted  Thought Process:  Coherent  Orientation:  Full (Time, Place, and Person)  Thought Content: Logical   Suicidal Thoughts:  No  Homicidal Thoughts:  No  Memory:  Immediate;   Good  Judgement:  Good  Insight:  Good  Psychomotor Activity:  EPS. Cogwheel rigidity on left arm  Concentration:  Concentration: Good and Attention Span: Good  Recall:  Good  Fund of Knowledge: Good  Language: Good  Akathisia:  No  Handed:  Right  AIMS (if indicated): not done  Assets:  Communication Skills Desire for Improvement  ADL's:  Intact  Cognition: WNL  Sleep:  Fair   Screenings: PHQ2-9    Flowsheet Row Office Visit from 10/15/2020 in [facility] Office Visit from 09/24/2020 in [facility]  PHQ-2 Total Score 0 6  PHQ-9 Total Score -- 19      Flowsheet Row Pre-Admission Testing 45 from 11/22/2020 in [facility] Video Visit from 10/29/2020 in [facility]  C-SSRS RISK CATEGORY No Risk No Risk        Assessment and Plan:  Rudolpho Claxton is a 65 y.o. year old male with a history of depression, anxiety, diabetes, hypertension, hyperlipidemia, carpal tunnel syndrome, s/p knee arthroscopy , who presents for follow up appointment for below.   1. MDD (major depressive disorder), recurrent episode, moderate (HCC) # Unspecified anxiety disorder Although he continues to report depressive symptoms and anxiety, there has been overall improvement since uptitration of bupropion.  Psychosocial stressors includes demoralization secondary to pain and unemployment.He reports fair relationship with his wife, and would like to go  back to work when his mood improves. Will continue duloxetine for depression anxiety. Will continue current dose of bupropion to see if it exerts its full benefit for depression. Will continue Abilify for depression at this time. Noted that he has cogwheel rigidity on exam; will plan to lower the dose in the near future as his mood improves. Discussed potential metabolic side effect and EPS from Abilify. Noted that he has been on clonazepam, prescribed by his PCP.  He is recommended to be tapered off this medication in the future especially given his history of alcohol use.  2. Insomnia, unspecified type Improving. He complains of hypersomnia, fatigue and occasional snoring.  Although he was recommended for sleep evaluation, he wants to hold this at this time. Will continue Trazodone at this time for insomnia.   # Alcohol use disorder in sustained remission He denies any alcohol use/craving for alcohol.  Will continue motivational interview.   Plan 1. Continue duloxetine 120 mg daily  2. Continue bupropion 450 mg daily 3. Continue Abilify 30 mg daily- monitor weight gain, cogwheel rigidity on left arm 4. Continue clonazepam 1 mg three times a day for anxiety- prescribed by PCP 5. Continue trazodone 50 mg at night 6. Next appointment: 8/26 at 11:30 for 30 mins, video - he sees a therapist  Past trials of medication: duloxetine, venlafaxine, Abilify, clonazepam,    The patient demonstrates the following risk factors for suicide: Chronic risk factors for suicide include: psychiatric disorder of depression and chronic pain. Acute risk factors for suicide include: unemployment. Protective factors for this patient include: positive social support, coping skills and hope for the future. Considering these factors, the overall suicide risk at this point appears to be low. Patient is appropriate for outpatient follow up.       Neysa Hotter, MD 11/25/2020, 8:49 AM

## 2020-11-22 ENCOUNTER — Other Ambulatory Visit: Payer: Self-pay

## 2020-11-22 ENCOUNTER — Other Ambulatory Visit
Admission: RE | Admit: 2020-11-22 | Discharge: 2020-11-22 | Disposition: A | Discharge status: Home / Self Care | Payer: Disability Insurance | Source: Ambulatory Visit | Attending: Surgery | Admitting: Surgery

## 2020-11-22 HISTORY — DX: Depression, unspecified: F32.A

## 2020-11-22 HISTORY — DX: Anxiety disorder, unspecified: F41.9

## 2020-11-22 NOTE — Patient Instructions (Addendum)
Your procedure is scheduled on: 11/28/20 - Thursday Report to the Registration Desk on the 1st floor of the [facility]. To find out your arrival time, please call [phone] between 1PM - 3PM on: 11/27/20- Wednesday Report to [facility] for Labs/EKG and Bag on 11/26/20 at 11:00 am.  REMEMBER: Instructions that are not followed completely may result in serious medical risk, up to and including death; or upon the discretion of your surgeon and anesthesiologist your surgery may need to be rescheduled.  Do not eat food after midnight the night before surgery.  No gum chewing, lozengers or hard candies.  You may however, drink CLEAR liquids up to 2 hours before you are scheduled to arrive for your surgery. Do not drink anything within 2 hours of your scheduled arrival time. Type 1 and Type 2 diabetics should only drink water.  In addition, your doctor has ordered for you to drink the provided , Gatorade G2 Drinking this carbohydrate drink up to two hours before surgery helps to reduce insulin resistance and improve patient outcomes. Please complete drinking 2 hours prior to scheduled arrival time.  TAKE THESE MEDICATIONS THE MORNING OF SURGERY WITH A SIP OF WATER:  - ARIPiprazole (ABILIFY) 30 MG tablet - atenolol (TENORMIN) 50 MG tablet - buPROPion (WELLBUTRIN XL) 150 MG 24 hr tablet - buPROPion (WELLBUTRIN XL) 300 MG 24 hr tablet - clonazePAM (KLONOPIN) 1 MG tablet - DULoxetine (CYMBALTA) 60 MG capsule - ezetimibe (ZETIA) 10 MG tablet - simvastatin (ZOCOR) 20 MG tablet   STOP Etodolac (LODINE) 500 MG tablet, stop taking 1 week prior to surgery.  Stop Metformin 2 days prior to surgery. Do not take 07/19, 07/20, and do not take the day of surgery.  STOP One week prior to surgery: Stop Anti-inflammatories (NSAIDS) such as Advil, Aleve, Ibuprofen, Motrin, Naproxen, Naprosyn and Aspirin based products such as Excedrin, Goodys Powder, BC Powder.  Stop ANY OVER THE COUNTER  supplements until after surgery.  You may take Tylenol as directed if needed for pain up until the day of surgery.  No Alcohol for 24 hours before or after surgery.  No Smoking including e-cigarettes for 24 hours prior to surgery.  No chewable tobacco products for at least 6 hours prior to surgery.  No nicotine patches on the day of surgery.  Do not use any "recreational" drugs for at least a week prior to your surgery.  Please be advised that the combination of cocaine and anesthesia may have negative outcomes, up to and including death. If you test positive for cocaine, your surgery will be cancelled.  On the morning of surgery brush your teeth with toothpaste and water, you may rinse your mouth with mouthwash if you wish. Do not swallow any toothpaste or mouthwash.  Do not wear jewelry, make-up, hairpins, clips or nail polish.  Do not wear lotions, powders, or perfumes.   Do not shave body from the neck down 48 hours prior to surgery just in case you cut yourself which could leave a site for infection.  Also, freshly shaved skin may become irritated if using the CHG soap.  Contact lenses, hearing aids and dentures may not be worn into surgery.  Do not bring valuables to the hospital. [facility] is not responsible for any missing/lost belongings or valuables.   Use CHG Soap or wipes as directed on instruction sheet.  Notify your doctor if there is any change in your medical condition (cold, fever, infection).  Wear comfortable clothing (specific to your  surgery type) to the hospital.  After surgery, you can help prevent lung complications by doing breathing exercises.  Take deep breaths and cough every 1-2 hours. Your doctor may order a device called an Incentive Spirometer to help you take deep breaths. When coughing or sneezing, hold a pillow firmly against your incision with both hands. This is called "splinting." Doing this helps protect your incision. It also decreases  belly discomfort.  If you are being admitted to the hospital overnight, leave your suitcase in the car. After surgery it may be brought to your room.  If you are being discharged the day of surgery, you will not be allowed to drive home. You will need a responsible adult (18 years or older) to drive you home and stay with you that night.   If you are taking public transportation, you will need to have a responsible adult (18 years or older) with you. Please confirm with your physician that it is acceptable to use public transportation.   Please call the Pre-admissions Testing Dept. at [phone] if you have any questions about these instructions.  Surgery Visitation Policy:  Patients undergoing a surgery or procedure may have one family member or support person with them as long as that person is not COVID-19 positive or experiencing its symptoms.  That person may remain in the waiting area during the procedure.  Inpatient Visitation:    Visiting hours are 7 a.m. to 8 p.m. Inpatients will be allowed two visitors daily. The visitors may change each day during the patient's stay. No visitors under the age of 36. Any visitor under the age of 42 must be accompanied by an adult. The visitor must pass COVID-19 screenings, use hand sanitizer when entering and exiting the patient's room and wear a mask at all times, including in the patient's room. Patients must also wear a mask when staff or their visitor are in the room. Masking is required regardless of vaccination status.

## 2020-11-25 ENCOUNTER — Encounter: Payer: Self-pay | Admitting: Psychiatry

## 2020-11-25 ENCOUNTER — Ambulatory Visit (INDEPENDENT_AMBULATORY_CARE_PROVIDER_SITE_OTHER): Payer: 59 | Admitting: Psychiatry

## 2020-11-25 ENCOUNTER — Other Ambulatory Visit: Payer: Self-pay

## 2020-11-25 VITALS — BP 123/78 | HR 80 | Temp 97.9°F | Wt 196.4 lb

## 2020-11-25 DIAGNOSIS — F411 Generalized anxiety disorder: Secondary | ICD-10-CM

## 2020-11-25 DIAGNOSIS — F331 Major depressive disorder, recurrent, moderate: Secondary | ICD-10-CM

## 2020-11-25 DIAGNOSIS — G4700 Insomnia, unspecified: Secondary | ICD-10-CM

## 2020-11-25 MED ORDER — BUPROPION HCL ER (XL) 300 MG PO TB24: ORAL_TABLET | ORAL | 2 refills | Status: DC

## 2020-11-25 MED ORDER — BUPROPION HCL ER (XL) 150 MG PO TB24: ORAL_TABLET | ORAL | 2 refills | Status: DC

## 2020-11-25 NOTE — Patient Instructions (Signed)
1. Continue duloxeitne 120 mg daily  2. Continue bupropion 450 mg daily 3. Continue Abilify 30 mg daily 5. Continue trazodone 50 mg at night 6. Next appointment: 8/26 at 11:30 for video

## 2020-11-26 ENCOUNTER — Encounter
Admission: RE | Admit: 2020-11-26 | Discharge: 2020-11-26 | Disposition: A | Discharge status: Home / Self Care | Payer: Commercial Managed Care - PPO | Source: Ambulatory Visit | Attending: Surgery | Admitting: Surgery

## 2020-11-26 DIAGNOSIS — Z01818 Encounter for other preprocedural examination: Secondary | ICD-10-CM | POA: Diagnosis present

## 2020-11-26 DIAGNOSIS — I10 Essential (primary) hypertension: Secondary | ICD-10-CM | POA: Diagnosis not present

## 2020-11-26 LAB — CBC
HCT: 42 % (ref 39.0–52.0)
Hemoglobin: 15.7 g/dL (ref 13.0–17.0)
MCH: 32.4 pg (ref 26.0–34.0)
MCHC: 37.4 g/dL — ABNORMAL HIGH (ref 30.0–36.0)
MCV: 86.8 fL (ref 80.0–100.0)
Platelets: 147 10*3/uL — ABNORMAL LOW (ref 150–400)
RBC: 4.84 MIL/uL (ref 4.22–5.81)
RDW: 12.5 % (ref 11.5–15.5)
WBC: 7.8 10*3/uL (ref 4.0–10.5)
nRBC: 0 % (ref 0.0–0.2)

## 2020-11-26 LAB — BASIC METABOLIC PANEL
Anion gap: 10 (ref 5–15)
BUN: 17 mg/dL (ref 8–23)
CO2: 27 mmol/L (ref 22–32)
Calcium: 9.5 mg/dL (ref 8.9–10.3)
Chloride: 96 mmol/L — ABNORMAL LOW (ref 98–111)
Creatinine, Ser: 0.77 mg/dL (ref 0.61–1.24)
GFR, Estimated: >60 mL/min (ref >60)
Glucose, Bld: 145 mg/dL — ABNORMAL HIGH (ref 70–99)
Potassium: 4.1 mmol/L (ref 3.5–5.1)
Sodium: 133 mmol/L — ABNORMAL LOW (ref 135–145)

## 2020-11-27 ENCOUNTER — Ambulatory Visit: Payer: Self-pay | Admitting: Psychologist

## 2020-11-27 MED ORDER — ORAL CARE MOUTH RINSE: 15.0000 mL | Freq: Once | OROMUCOSAL | Status: AC

## 2020-11-27 MED ORDER — CHLORHEXIDINE GLUCONATE 0.12 % MT SOLN
15.0000 mL | Freq: Once | OROMUCOSAL | Status: AC
Start: 2020-11-27 — End: 2020-11-28

## 2020-11-27 MED ORDER — SODIUM CHLORIDE 0.9 % IV SOLN: INTRAVENOUS | Status: DC

## 2020-11-27 MED ORDER — CEFAZOLIN SODIUM-DEXTROSE 2-4 GM/100ML-% IV SOLN
2.0000 g | INTRAVENOUS | Status: AC
  Administered 2020-11-28: 2 g via INTRAVENOUS

## 2020-11-27 MED ORDER — FAMOTIDINE 20 MG PO TABS: 20.0000 mg | ORAL_TABLET | Freq: Once | ORAL | Status: AC

## 2020-11-28 ENCOUNTER — Ambulatory Visit: Payer: Commercial Managed Care - PPO | Admitting: Certified Registered"

## 2020-11-28 ENCOUNTER — Ambulatory Visit
Admission: RE | Admit: 2020-11-28 | Discharge: 2020-11-28 | Disposition: A | Discharge status: Home / Self Care | Payer: Commercial Managed Care - PPO | Attending: Surgery | Admitting: Surgery

## 2020-11-28 ENCOUNTER — Other Ambulatory Visit: Payer: Self-pay

## 2020-11-28 ENCOUNTER — Encounter
Admission: RE | Disposition: A | Discharge status: Home / Self Care | Payer: Self-pay | Source: Home / Self Care | Attending: Surgery

## 2020-11-28 ENCOUNTER — Encounter: Payer: Self-pay | Admitting: Surgery

## 2020-11-28 DIAGNOSIS — Z79899 Other long term (current) drug therapy: Secondary | ICD-10-CM | POA: Insufficient documentation

## 2020-11-28 DIAGNOSIS — Z7984 Long term (current) use of oral hypoglycemic drugs: Secondary | ICD-10-CM | POA: Diagnosis not present

## 2020-11-28 DIAGNOSIS — Z888 Allergy status to other drugs, medicaments and biological substances status: Secondary | ICD-10-CM | POA: Diagnosis not present

## 2020-11-28 DIAGNOSIS — G5603 Carpal tunnel syndrome, bilateral upper limbs: Secondary | ICD-10-CM | POA: Insufficient documentation

## 2020-11-28 DIAGNOSIS — Z791 Long term (current) use of non-steroidal anti-inflammatories (NSAID): Secondary | ICD-10-CM | POA: Diagnosis not present

## 2020-11-28 HISTORY — PX: CARPAL TUNNEL RELEASE: SHX101

## 2020-11-28 LAB — GLUCOSE, CAPILLARY
Glucose-Capillary: 151 mg/dL — ABNORMAL HIGH (ref 70–99)
Glucose-Capillary: 118 mg/dL — ABNORMAL HIGH (ref 70–99)

## 2020-11-28 SURGERY — RELEASE, CARPAL TUNNEL, ENDOSCOPIC
Anesthesia: General | Site: Wrist | Laterality: Right

## 2020-11-28 MED ORDER — FENTANYL CITRATE (PF) 100 MCG/2ML IJ SOLN
INTRAMUSCULAR | Status: AC
  Filled 2020-11-28: qty 2

## 2020-11-28 MED ORDER — GABAPENTIN 100 MG PO CAPS: ORAL_CAPSULE | ORAL | Status: DC

## 2020-11-28 MED ORDER — ONDANSETRON HCL 4 MG/2ML IJ SOLN
INTRAMUSCULAR | Status: DC | PRN
  Administered 2020-11-28: 4 mg via INTRAVENOUS

## 2020-11-28 MED ORDER — 0.9 % SODIUM CHLORIDE (POUR BTL) OPTIME
TOPICAL | Status: DC | PRN
  Administered 2020-11-28: 100 mL

## 2020-11-28 MED ORDER — LIDOCAINE HCL (PF) 2 % IJ SOLN
INTRAMUSCULAR | Status: AC
  Filled 2020-11-28: qty 5

## 2020-11-28 MED ORDER — FAMOTIDINE 20 MG PO TABS
ORAL_TABLET | ORAL | Status: AC
  Administered 2020-11-28: 20 mg via ORAL
  Filled 2020-11-28: qty 1

## 2020-11-28 MED ORDER — FENTANYL CITRATE (PF) 100 MCG/2ML IJ SOLN: 25.0000 ug | INTRAMUSCULAR | Status: DC | PRN

## 2020-11-28 MED ORDER — PROPOFOL 10 MG/ML IV BOLUS
INTRAVENOUS | Status: AC
  Filled 2020-11-28: qty 20

## 2020-11-28 MED ORDER — LIDOCAINE HCL (CARDIAC) PF 100 MG/5ML IV SOSY
PREFILLED_SYRINGE | INTRAVENOUS | Status: DC | PRN
  Administered 2020-11-28: 80 mg via INTRAVENOUS

## 2020-11-28 MED ORDER — MIDAZOLAM HCL 2 MG/2ML IJ SOLN
INTRAMUSCULAR | Status: DC | PRN
  Administered 2020-11-28: 2 mg via INTRAVENOUS

## 2020-11-28 MED ORDER — PROMETHAZINE HCL 25 MG/ML IJ SOLN: 6.2500 mg | INTRAMUSCULAR | Status: DC | PRN

## 2020-11-28 MED ORDER — BUPIVACAINE HCL (PF) 0.5 % IJ SOLN
INTRAMUSCULAR | Status: AC
  Filled 2020-11-28: qty 30

## 2020-11-28 MED ORDER — FENTANYL CITRATE (PF) 100 MCG/2ML IJ SOLN
INTRAMUSCULAR | Status: DC | PRN
  Administered 2020-11-28 (×2): 25 ug via INTRAVENOUS

## 2020-11-28 MED ORDER — BUPIVACAINE HCL (PF) 0.5 % IJ SOLN
INTRAMUSCULAR | Status: DC | PRN
  Administered 2020-11-28: 10 mL

## 2020-11-28 MED ORDER — MIDAZOLAM HCL 2 MG/2ML IJ SOLN
INTRAMUSCULAR | Status: AC
  Filled 2020-11-28: qty 2

## 2020-11-28 MED ORDER — PROPOFOL 10 MG/ML IV BOLUS
INTRAVENOUS | Status: DC | PRN
  Administered 2020-11-28: 170 mg via INTRAVENOUS

## 2020-11-28 MED ORDER — CEFAZOLIN SODIUM-DEXTROSE 2-4 GM/100ML-% IV SOLN
INTRAVENOUS | Status: AC
  Filled 2020-11-28: qty 100

## 2020-11-28 MED ORDER — CHLORHEXIDINE GLUCONATE 0.12 % MT SOLN
OROMUCOSAL | Status: AC
  Administered 2020-11-28: 15 mL via OROMUCOSAL
  Filled 2020-11-28: qty 15

## 2020-11-28 SURGICAL SUPPLY — 33 items
APL PRP STRL LF DISP 70% ISPRP (MISCELLANEOUS) ×1
BNDG COHESIVE 4X5 TAN STRL (GAUZE/BANDAGES/DRESSINGS) ×2 IMPLANT
BNDG ELASTIC 2X5.8 VLCR STR LF (GAUZE/BANDAGES/DRESSINGS) ×2 IMPLANT
BNDG ESMARK 4X12 TAN STRL LF (GAUZE/BANDAGES/DRESSINGS) ×2 IMPLANT
CANISTER SUCT 1200ML W/VALVE (MISCELLANEOUS) IMPLANT
CHLORAPREP W/TINT 26 (MISCELLANEOUS) ×2 IMPLANT
CORD BIP STRL DISP 12FT (MISCELLANEOUS) ×2 IMPLANT
CUFF TOURN SGL QUICK 18X4 (TOURNIQUET CUFF) ×2 IMPLANT
DRAPE SURG 17X11 SM STRL (DRAPES) ×2 IMPLANT
FORCEPS JEWEL BIP 4-3/4 STR (INSTRUMENTS) ×2 IMPLANT
GAUZE 4X4 16PLY ~~LOC~~+RFID DBL (SPONGE) ×2 IMPLANT
GAUZE SPONGE 4X4 12PLY STRL (GAUZE/BANDAGES/DRESSINGS) ×2 IMPLANT
GAUZE XEROFORM 1X8 LF (GAUZE/BANDAGES/DRESSINGS) ×2 IMPLANT
GLOVE SURG ENC MOIS LTX SZ8 (GLOVE) ×2 IMPLANT
GLOVE SURG UNDER LTX SZ8 (GLOVE) ×2 IMPLANT
GOWN STRL REUS W/ TWL LRG LVL3 (GOWN DISPOSABLE) ×1 IMPLANT
GOWN STRL REUS W/ TWL XL LVL3 (GOWN DISPOSABLE) ×1 IMPLANT
GOWN STRL REUS W/TWL LRG LVL3 (GOWN DISPOSABLE) ×2
GOWN STRL REUS W/TWL XL LVL3 (GOWN DISPOSABLE) ×2
KIT CARPAL TUNNEL (MISCELLANEOUS) ×2
KIT ESCP INSRT D SLOT CANN KN (MISCELLANEOUS) ×1 IMPLANT
KIT TURNOVER KIT A (KITS) ×2 IMPLANT
MANIFOLD NEPTUNE II (INSTRUMENTS) ×2 IMPLANT
NS IRRIG 500ML POUR BTL (IV SOLUTION) ×2 IMPLANT
PACK EXTREMITY ARMC (MISCELLANEOUS) ×2 IMPLANT
SPLINT WRIST LG LT TX990309 (SOFTGOODS) IMPLANT
SPLINT WRIST LG RT TX900304 (SOFTGOODS) IMPLANT
SPLINT WRIST M LT TX990308 (SOFTGOODS) IMPLANT
SPLINT WRIST M RT TX990303 (SOFTGOODS) IMPLANT
SPLINT WRIST XL LT TX990310 (SOFTGOODS) IMPLANT
SPLINT WRIST XL RT TX990305 (SOFTGOODS) ×2 IMPLANT
STOCKINETTE IMPERVIOUS 9X36 MD (GAUZE/BANDAGES/DRESSINGS) ×2 IMPLANT
SUT PROLENE 4 0 PS 2 18 (SUTURE) ×2 IMPLANT

## 2020-11-28 NOTE — H&P (Signed)
History of Present Illness:  Ricky [name]. is a 65 y.o. male who presents for evaluation and treatment of his bilateral hand and wrist pain and paresthesias, right more symptomatic than left. The symptoms have been present for several years and developed without any specific cause or injury. The patient has been diagnosed with bilateral carpal tunnel syndrome as confirmed by EMG studies performed in December, 2020 which demonstrated moderate carpal tunnel syndrome on the right and mild carpal tunnel syndrome on the left. He underwent a carpal tunnel injection on the right in [place] without significant benefit. The symptoms have worsened over the past year or so, prompting him to see Van Clines, PA, in April who ordered repeat EMGs and referred him to me for further evaluation and treatment.  The patient complains of constant pain, especially in the palmar aspect of his right hand, with occasional paresthesias to the thumb, index, and long fingers. He is having difficulty making a full fist and grasping or holding objects. He also feels that his hand strength has weakened. His symptoms keep him awake at night. 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  Current Outpatient Medications:  etodolac (LODINE) 500 MG tablet Take 1 tablet (500 mg total) by mouth 2 (two) times daily as needed   ARIPiprazole (ABILIFY) 30 MG tablet Take 1 tablet (30 mg total) by mouth once daily 30 tablet 11   atenolol (TENORMIN) 50 MG tablet Take 50 mg by mouth once daily   buPROPion (WELLBUTRIN SR) 150 MG SR tablet Take 1 tablet (150 mg total) by mouth 2 (two) times daily 180 tablet 3   chlorthalidone 25 MG tablet Take 25 mg by mouth once daily   clonazePAM (KLONOPIN) 1 MG tablet Take 1 tablet (1 mg total) by mouth 3 (three) times daily as needed for Anxiety 90 tablet 5   diclofenac potassium (CATAFLAM) 50 mg tablet TAKE 1.5 TABLETS (75 MG TOTAL) BY MOUTH 2 (TWO) TIMES  DAILY WITH MEALS 60 tablet 0   DULoxetine (CYMBALTA) 30 MG DR capsule Take 2 capsules (60 mg total) by mouth once daily 180 capsule 3   enalapril (VASOTEC) 10 MG tablet Take 10 mg by mouth once daily   ezetimibe (ZETIA) 10 mg tablet Take 10 mg by mouth once daily   gabapentin (NEURONTIN) 100 MG capsule TAKE 1 CAPSULE AT BEDTIME FOR 7 DAYS,2 CAPS AT BEDTIME FOR 7 DAYS,3 CAPSULES AT BEDTIME.   ketoconazole (NIZORAL) 2 % shampoo Apply topically once daily as needed (facial rash) Leave on for 5 minutes, then rinse. 120 mL 5   metFORMIN (GLUCOPHAGE) 500 MG tablet Take 500 mg by mouth 2 (two) times daily with meals 1 tablets by mouth in morning and 1 tablets at night   sildenafiL (VIAGRA) 50 MG tablet Take 1 tablet (50 mg total) by mouth once daily as needed for Erectile Dysfunction 10 tablet 11   simvastatin (ZOCOR) 20 MG tablet Take 20 mg by mouth every evening   traZODone (DESYREL) 50 MG tablet Take 50 mg by mouth nightly   VICTOZA 3-PAK 0.6 mg/0.1 mL (18 mg/3 mL) pen injector INJECT 1.2 MG UNDER THE SKIN ONCE DAILY   Allergies:   Mirtazapine (Bulk) Rash   Past Medical History:   Anxiety disorder   Depression   Diabetes mellitus type 2, uncomplicated (CMS-HCC)   Hyperlipidemia   Hypertension   Past Surgical History:   FRACTURE SURGERY Right Index finger (had  pins inserted and then removed)   KNEE ARTHROSCOPY Right 1975 x 2   Family History:   High blood pressure (Hypertension) Mother   Diabetes type II Mother   Depression Mother   High blood pressure (Hypertension) Father   Depression Sister   Depression Brother   Diabetes type II Brother   Social History:   Socioeconomic History:   Marital status: Single  Occupational History   Occupation: Curator  Tobacco Use   Smoking status: Never Smoker   Smokeless tobacco: Never Used  Building services engineer Use: Never used  Substance and Sexual Activity   Alcohol use: Yes  Alcohol/week: 15.0 standard drinks  Types: 15 Cans of beer  per week   Drug use: No   Sexual activity: Defer   Review of Systems:  A comprehensive 14 point ROS was performed, reviewed, and the pertinent orthopaedic findings are documented in the HPI.  Physical Exam: Vitals:  11/08/20 0858  BP: 120/84  Weight: 89.9 kg (198 lb 3.2 oz)  Height: 175.3 cm (5\' 9" )  PainSc: 6  PainLoc: Hand   General/Constitutional: The patient appears to be well-nourished, well-developed, and in no acute distress. Neuro/Psych: Normal mood and affect, oriented to person, place and time. Eyes: Non-icteric. Pupils are equal, round, and reactive to light, and exhibit synchronous movement. ENT: Unremarkable. Lymphatic: No palpable adenopathy. Respiratory: Lungs clear to auscultation, Normal chest excursion, No wheezes and Non-labored breathing Cardiovascular: Regular rate and rhythm. No murmurs. and No edema, swelling or tenderness, except as noted in detailed exam. Integumentary: No impressive skin lesions present, except as noted in detailed exam. Musculoskeletal: Unremarkable, except as noted in detailed exam.  Right wrist/hand exam: Skin inspection of the right wrist and hand is unremarkable. No swelling, erythema, ecchymosis, abrasions, or other skin abnormalities are identified. He has no tenderness to palpation over the dorsal or palmar aspects of the hand. Wrist motion is smooth and pain-free. He is able to actively flex and extend all digits without any pain or triggering. However, he has difficulty making a full fist, lacking several centimeters from achieving his fingertips to the proximal palmar crease. He is neurovascularly intact to all digits, although grip strength is decreased at 4/5, primarily due to his inability to close his hand fully. Intrinsic muscle testing also is decreased at 4-4+/5. He has intact sensation to the tips of all digits. He has a negative Phalen's test and a positive Tinel's over the right carpal tunnel.  EMG results:  A recent  EMG/NCV of both hands is available for review. By report, the study demonstrates evidence of severe carpal tunnel syndrome of the right wrist and mild carpal tunnel syndrome of the left wrist. These results were reviewed by myself and discussed with the patient and his wife.  Assessment:  Carpal tunnel syndrome, right  Plan: The treatment options were discussed with the patient and his wife. In addition, patient educational materials were provided regarding the diagnosis and treatment options. The patient is quite frustrated by his symptoms and functional limitations, especially as they pertain to his right hand, and would like to consider more aggressive treatment options. Therefore, I have recommended a surgical procedure, specifically an endoscopic right carpal tunnel release. The procedure was discussed with the patient, as were the potential risks (including bleeding, infection, nerve and/or blood vessel injury, persistent or recurrent pain/paresthesias, weakness of grip, persistence and/or worsening of his tremors, need for further surgery, blood clots, strokes, heart attacks and/or arhythmias, pneumonia, etc.) and benefits. The  patient states his/her understanding and wishes to proceed. All of the patient's questions and concerns were answered. He can call any time with further concerns. He will follow up post-surgery, routine.   H&P reviewed and patient re-examined. No changes.

## 2020-11-28 NOTE — Discharge Instructions (Addendum)
Orthopedic discharge instructions: Keep dressing dry and intact. Keep hand elevated above heart level. May shower after dressing removed on postop day 4 (Monday). Cover sutures with Band-Aids after drying off then reapply Velcro splint. Apply ice to affected area frequently. Take etodolac 500 mg BID with meals for 7-10 days, then as necessary. Take ES Tylenol when needed.  Return for follow-up in 10-14 days or as scheduled.AMBULATORY SURGERY  DISCHARGE INSTRUCTIONS   The drugs that you were given will stay in your system until tomorrow so for the next 24 hours you should not:  Drive an automobile Make any legal decisions Drink any alcoholic beverage   You may resume regular meals tomorrow.  Today it is better to start with liquids and gradually work up to solid foods.  You may eat anything you prefer, but it is better to start with liquids, then soup and crackers, and gradually work up to solid foods.   Please notify your doctor immediately if you have any unusual bleeding, trouble breathing, redness and pain at the surgery site, drainage, fever, or pain not relieved by medication.    Additional Instructions:        Please contact your physician with any problems or Same Day Surgery at [phone], Monday through Friday 6 am to 4 pm, or [place] at [facility] number at [phone].

## 2020-11-28 NOTE — Transfer of Care (Signed)
Immediate Anesthesia Transfer of Care Note  Patient: Ricky [name]  Procedure(s) Performed: CARPAL TUNNEL RELEASE ENDOSCOPIC (Right: Wrist)  Patient Location: PACU  Anesthesia Type:General  Level of Consciousness: drowsy  Airway & Oxygen Therapy: Patient spontaneous breathing and connected to face mask  Post-op Assessment: Report given to RN and Post -op Vital signs reviewed and stable  Post vital signs: Reviewed and stable  Last Vitals:  Vitals Value Taken Time  BP 124/74 11/28/20 1356  Temp    Pulse 66 11/28/20 1359  Resp 15 11/28/20 1359  SpO2 98 % 11/28/20 1359  Vitals shown include unvalidated device data.  Last Pain:  Vitals:   11/28/20 1120  TempSrc: Temporal  PainSc: 3          Complications: No notable events documented.

## 2020-11-28 NOTE — Anesthesia Procedure Notes (Signed)
Procedure Name: LMA Insertion Date/Time: 11/28/2020 1:14 PM Performed by: Elmarie Mainland, CRNA Pre-anesthesia Checklist: Patient identified, Emergency Drugs available, Suction available and Patient being monitored Patient Re-evaluated:Patient Re-evaluated prior to induction Oxygen Delivery Method: Circle system utilized Preoxygenation: Pre-oxygenation with 100% oxygen Induction Type: IV induction Ventilation: Mask ventilation without difficulty LMA: LMA inserted LMA Size: 4.5 Number of attempts: 1 Placement Confirmation: positive ETCO2 and breath sounds checked- equal and bilateral Tube secured with: Tape Dental Injury: Teeth and Oropharynx as per pre-operative assessment

## 2020-11-28 NOTE — Anesthesia Postprocedure Evaluation (Signed)
Anesthesia Post Note  Patient: Ricky [name]  Procedure(s) Performed: CARPAL TUNNEL RELEASE ENDOSCOPIC (Right: Wrist)  Patient location during evaluation: PACU Anesthesia Type: General Level of consciousness: awake and alert Pain management: pain level controlled Vital Signs Assessment: post-procedure vital signs reviewed and stable Respiratory status: spontaneous breathing, nonlabored ventilation, respiratory function stable and patient connected to nasal cannula oxygen Cardiovascular status: blood pressure returned to baseline and stable Postop Assessment: no apparent nausea or vomiting Anesthetic complications: no   No notable events documented.   Last Vitals:  Vitals:   11/28/20 1430 11/28/20 1455  BP: 126/77 134/86  Pulse: 72 63  Resp:  20  Temp: (!) 36.2 C (!) 36.3 C  SpO2: 94% 96%    Last Pain:  Vitals:   11/28/20 1455  TempSrc: Temporal  PainSc: 0-No pain                 Lenard Simmer

## 2020-11-28 NOTE — Anesthesia Preprocedure Evaluation (Signed)
Anesthesia Evaluation  Patient identified by MRN, date of birth, ID band Patient awake    Reviewed: Allergy & Precautions, H&P , NPO status , Patient's Chart, lab work & pertinent test results, reviewed documented beta blocker date and time   History of Anesthesia Complications Negative for: history of anesthetic complications  Airway Mallampati: I  TM Distance: >3 FB Neck ROM: full    Dental  (+) Dental Advidsory Given, Teeth Intact   Pulmonary neg pulmonary ROS,    Pulmonary exam normal breath sounds clear to auscultation       Cardiovascular Exercise Tolerance: Good hypertension, (-) angina(-) Past MI and (-) Cardiac Stents Normal cardiovascular exam(-) dysrhythmias (-) Valvular Problems/Murmurs Rhythm:regular Rate:Normal     Neuro/Psych PSYCHIATRIC DISORDERS Anxiety Depression negative neurological ROS     GI/Hepatic negative GI ROS, Neg liver ROS,   Endo/Other  diabetes  Renal/GU negative Renal ROS  negative genitourinary   Musculoskeletal   Abdominal   Peds  Hematology negative hematology ROS (+)   Anesthesia Other Findings Past Medical History: No date: Anxiety No date: Carpal tunnel syndrome No date: Depression No date: Diabetes (HCC) No date: Hyperlipidemia No date: Hypertension   Reproductive/Obstetrics negative OB ROS                             Anesthesia Physical Anesthesia Plan  ASA: 2  Anesthesia Plan: General   Post-op Pain Management:    Induction: Intravenous  PONV Risk Score and Plan: 2 and Ondansetron, Dexamethasone and Treatment may vary due to age or medical condition  Airway Management Planned: LMA  Additional Equipment:   Intra-op Plan:   Post-operative Plan: Extubation in OR  Informed Consent: I have reviewed the patients History and Physical, chart, labs and discussed the procedure including the risks, benefits and alternatives for the  proposed anesthesia with the patient or authorized representative who has indicated his/her understanding and acceptance.     Dental Advisory Given  Plan Discussed with: Anesthesiologist, CRNA and Surgeon  Anesthesia Plan Comments:         Anesthesia Quick Evaluation

## 2020-11-28 NOTE — Op Note (Signed)
11/28/2020  1:53 PM  Patient:   Ricky [name]  Pre-Op Diagnosis:   Right carpal tunnel syndrome.  Post-Op Diagnosis:   Same.  Procedure:   Endoscopic right carpal tunnel release.  Surgeon:   Maryagnes Amos, MD  Assistant:   Amedeo Plenty, PA-S  Anesthesia:   General LMA  Findings:   As above.  Complications:   None  EBL:   0 cc  Fluids:   900 cc crystalloid  TT:   18 minutes at 250 mmHg  Drains:   None  Closure:   4-0 Prolene interrupted sutures  Brief Clinical Note:   The patient is a 65 year old male with a long history of progressively worsening pain, weakness, and paresthesias to his right hand. His symptoms have progressed despite medications, activity modification, etc. His history and examination are consistent with carpal tunnel syndrome, confirmed by EMG. The patient presents at this time for an endoscopic right carpal tunnel release.   Procedure:   The patient was brought into the operating room and lain in the supine position. After adequate general laryngeal mask anesthesia was obtained, the right hand and upper extremity were prepped with ChloraPrep solution before being draped sterilely. Preoperative antibiotics were administered. A timeout was performed to verify the appropriate surgical site before the limb was exsanguinated with an Esmarch and the tourniquet inflated to 250 mmHg. An approximately 1.5-2 cm incision was made over the volar wrist flexion crease, centered over the palmaris longus tendon. The incision was carried down through the subcutaneous tissues with care taken to identify and protect any neurovascular structures. The distal forearm fascia was penetrated just proximal to the transverse carpal ligament. The soft tissues were released off the superficial and deep surfaces of the distal forearm fascia and this was released proximally for 3-4 cm under direct visualization.  Attention was directed distally. The Therapist, nutritional was passed beneath the  transverse carpal ligament along the ulnar aspect of the carpal tunnel and used to release any adhesions as well as to remove any adherent synovial tissue before first the smaller then the larger of the two dilators were passed beneath the transverse carpal ligament along the ulnar margin of the carpal tunnel. The slotted cannula was introduced and the endoscope was placed into the slotted cannula and the undersurface of the transverse carpal ligament visualized. The distal margin of the transverse carpal ligament was marked by placing a 25-gauge needle percutaneously at Kaplan's cardinal point so that it entered the distal portion of the slotted cannula. Under endoscopic visualization, the transverse carpal ligament was released from proximal to distal using the end-cutting blade. A second pass was performed to ensure complete release of the ligament. The adequacy of release was verified both endoscopically and by palpation using the freer elevator.  The wound was irrigated thoroughly with sterile saline solution before being closed using 4-0 Prolene interrupted sutures. A total of 10 cc of 0.5% plain Sensorcaine was injected in and around the incision before a sterile bulky dressing was applied to the wound. The patient was placed into a volar wrist splint before being awakened, extubated, and returned to the recovery room in satisfactory condition after tolerating the procedure well.

## 2020-11-29 ENCOUNTER — Encounter: Payer: Self-pay | Admitting: Surgery

## 2020-12-05 ENCOUNTER — Encounter: Payer: 59 | Admitting: Student in an Organized Health Care Education/Training Program

## 2020-12-11 ENCOUNTER — Other Ambulatory Visit: Payer: Self-pay | Admitting: Student in an Organized Health Care Education/Training Program

## 2020-12-31 ENCOUNTER — Encounter: Payer: Self-pay | Admitting: Student in an Organized Health Care Education/Training Program

## 2020-12-31 ENCOUNTER — Ambulatory Visit
Payer: Commercial Managed Care - PPO | Attending: Student in an Organized Health Care Education/Training Program | Admitting: Student in an Organized Health Care Education/Training Program

## 2020-12-31 ENCOUNTER — Other Ambulatory Visit: Payer: Self-pay

## 2020-12-31 VITALS — BP 107/78 | HR 79 | Temp 97.4°F | Resp 16 | Ht 69.0 in | Wt 190.0 lb

## 2020-12-31 DIAGNOSIS — M5414 Radiculopathy, thoracic region: Secondary | ICD-10-CM | POA: Diagnosis not present

## 2020-12-31 DIAGNOSIS — M546 Pain in thoracic spine: Secondary | ICD-10-CM | POA: Insufficient documentation

## 2020-12-31 DIAGNOSIS — G894 Chronic pain syndrome: Secondary | ICD-10-CM | POA: Diagnosis not present

## 2020-12-31 DIAGNOSIS — G8929 Other chronic pain: Secondary | ICD-10-CM | POA: Insufficient documentation

## 2020-12-31 DIAGNOSIS — M545 Low back pain, unspecified: Secondary | ICD-10-CM | POA: Diagnosis present

## 2020-12-31 MED ORDER — GABAPENTIN 300 MG PO CAPS: 300.0000 mg | ORAL_CAPSULE | Freq: Two times a day (BID) | ORAL | 3 refills | Status: DC

## 2020-12-31 NOTE — Progress Notes (Deleted)
Nursing Pain Medication Assessment:  Safety precautions to be maintained throughout the outpatient stay will include: orient to surroundings, keep bed in low position, maintain call bell within reach at all times, provide assistance with transfer out of bed and ambulation.  Medication Inspection Compliance: Mr. Mclees did not comply with our request to bring his pills to be counted. He was reminded that bringing the medication bottles, even when empty, is a requirement.  Medication: None brought in. Pill/Patch Count: None available to be counted. Bottle Appearance: No container available. Did not bring bottle(s) to appointment. Filled Date: N/A Last Medication intake:  Yesterday

## 2020-12-31 NOTE — Progress Notes (Signed)
PROVIDER NOTE: Information contained herein reflects review and annotations entered in association with encounter. Interpretation of such information and data should be left to medically-trained personnel. Information provided to patient can be located elsewhere in the medical record under "Patient Instructions". Document created using STT-dictation technology, any transcriptional errors that may result from process are unintentional.    Patient: Ricky [name]  Service Category: E/M  Provider: [name], MD  DOB: [birth year]  DOS: 12/31/2020  Specialty: Interventional Pain Management  MRN: [id number]  Setting: Ambulatory outpatient  PCP: Adin Hector, MD  Type: Established Patient    Referring Provider: Adin Hector, MD  Location: Office  Delivery: Face-to-face     HPI  Mr. Ricky [name], a 65 y.o. year old male, is here today because of his Chronic bilateral thoracic back pain [M54.6, G89.29]. Mr. Remmel primary complain today is Back Pain (Upper between shoulder blades. ) Last encounter: My last encounter with him was on 12/11/2020. Pertinent problems: Mr. Rademaker has Chronic bilateral thoracic back pain; Lumbar spine pain; and Thoracic radiculitis on their pertinent problem list. Pain Assessment: Severity of Chronic pain is reported as a 4 /10. Location: Back Upper/denies. Onset: More than a month ago. Quality: Discomfort, Constant, Dull, Aching. Timing: Constant. Modifying factor(s): sitting in recliner wil ease it off.. Vitals:  height is '5\' 9"'  (1.753 m) and weight is 190 lb (86.2 kg). His temporal temperature is 97.4 F (36.3 C) (abnormal). His blood pressure is 107/78 and his pulse is 79. His respiration is 16 and oxygen saturation is 99%.   Reason for encounter: (Second patient visit)  Is finding benefit with gabapentin at 300 mg nightly.  Discussed increase to 300 mg twice daily.  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  Given that patient's pain is overall well managed at this time, can hold off on this.    HPI from initial clinic visit Ricky [name] is a pleasant 65 year old male who presents with a chief complaint of mid thoracic and upper lumbar spine pain that occasionally radiates out bilaterally.  Of note he has not done physical therapy in the past.  He has had 3 series of thoracic epidural steroid injections with Dr. Alba Destine with limited response.  He has tried meloxicam, Tylenol, naproxen, tramadol with limited benefit.  He does not recall having tried gabapentin in the past.  He does have a history of depression and anxiety for which she is on a host of psychotropic medications.  I advised against chronic opioid therapy in the context of him being on daily Klonopin which is a benzodiazepine that I explained to him.  I explained to the patient that I do not prescribe opioid medications to patients that are on benzodiazepines given risk of respiratory depression.  He denies any bowel or bladder issues.  ROS  Constitutional: Denies any fever or chills Gastrointestinal: No reported hemesis, hematochezia, vomiting, or acute GI distress Musculoskeletal: Denies any acute onset joint swelling, redness, loss of ROM, or weakness Neurological: No reported episodes of acute onset apraxia, aphasia, dysarthria, agnosia, amnesia, paralysis, loss of coordination, or loss of consciousness  Medication Review  ARIPiprazole, DULoxetine, atenolol, buPROPion, carbidopa-levodopa, chlorthalidone, clonazePAM, enalapril, etodolac, ezetimibe, gabapentin, ketoconazole, liraglutide, metFORMIN, sildenafil, simvastatin, and traZODone  History Review  Allergy: Mr. Ricky [name] is allergic to mirtazapine. Drug: Mr. Perleberg  reports no history of drug use. Alcohol:  reports no history of alcohol use. Tobacco:  reports that he has  never smoked. He has never used smokeless tobacco. Social: Mr. Ricky [name]  reports that he has never smoked. He  has never used smokeless tobacco. He reports that he does not drink alcohol and does not use drugs. Medical:  has a past medical history of Anxiety, Carpal tunnel syndrome, Depression, Diabetes ([place]), Hyperlipidemia, and Hypertension. Surgical: Mr. Ricky [name]  has a past surgical history that includes Carpal tunnel release (Right, 11/28/2020). Family: family history includes Depression in his brother and sister.  Laboratory Chemistry Profile   Renal Lab Results  Component Value Date   BUN 17 11/26/2020   CREATININE 0.77 11/26/2020   GFRNONAA >60 11/26/2020    Hepatic No results found for: AST, ALT, ALBUMIN, ALKPHOS, HCVAB, AMYLASE, LIPASE, AMMONIA  Electrolytes Lab Results  Component Value Date   NA 133 (L) 11/26/2020   K 4.1 11/26/2020   CL 96 (L) 11/26/2020   CALCIUM 9.5 11/26/2020    Bone No results found for: VD25OH, QM578IO9GEX, BM8413KG4, WN0272ZD6, 25OHVITD1, 25OHVITD2, 25OHVITD3, TESTOFREE, TESTOSTERONE  Inflammation (CRP: Acute Phase) (ESR: Chronic Phase) No results found for: CRP, ESRSEDRATE, LATICACIDVEN       Note: Above Lab results reviewed.  Recent Imaging Review  DG Lumbar Spine 2-3 Views CLINICAL DATA:  Degenerative disc disease changes lumbar spine  EXAM: LUMBAR SPINE - 2-3 VIEW  COMPARISON:  None  FINDINGS: Mild osseous demineralization.  Five non-rib-bearing lumbar vertebra.  Slight disc space narrowing L5-S1 with minimal retrolisthesis.  Scattered tiny endplate spurs.  Vertebral body heights maintained without fracture or additional subluxation.  No bone destruction or gross spondylolysis.  SI joints symmetric.  IMPRESSION: Mild degenerative disc disease changes, greatest at L5-S1 with minimal retrolisthesis.  Electronically Signed   By: Lavonia Dana M.D.   On: 10/06/2020 11:13 Note: Reviewed        Physical Exam  General appearance: Well nourished, well developed, and well hydrated. In no apparent acute distress Mental status: Alert,  oriented x 3 (person, place, & time)       Respiratory: No evidence of acute respiratory distress Eyes: PERLA Vitals: BP 107/78 (BP Location: Right Arm, Patient Position: Sitting, Cuff Size: Normal)   Pulse 79   Temp (!) 97.4 F (36.3 C) (Temporal)   Resp 16   Ht '5\' 9"'  (1.753 m)   Wt 190 lb (86.2 kg)   SpO2 99%   BMI 28.06 kg/m  BMI: Estimated body mass index is 28.06 kg/m as calculated from the following:   Height as of this encounter: '5\' 9"'  (1.753 m).   Weight as of this encounter: 190 lb (86.2 kg). Ideal: Ideal body weight: 70.7 kg (155 lb 13.8 oz) Adjusted ideal body weight: 76.9 kg (169 lb 8.3 oz)  Assessment   Status Diagnosis  Controlled Controlled Controlled 1. Chronic bilateral thoracic back pain   2. Lumbar spine pain   3. Thoracic radiculitis   4. Chronic pain syndrome      Updated Problems: Problem  Chronic Bilateral Thoracic Back Pain  Lumbar Spine Pain  Thoracic Radiculitis  Chronic Pain Syndrome    Plan of Care    Mr. Tryce Surratt has a current medication list which includes the following long-term medication(s): aripiprazole, atenolol, bupropion, bupropion, carbidopa-levodopa, chlorthalidone, clonazepam, duloxetine, enalapril, ezetimibe, gabapentin, metformin, sildenafil, simvastatin, trazodone, and victoza.  Pharmacotherapy (Medications Ordered): Meds ordered this encounter  Medications   gabapentin (NEURONTIN) 300 MG capsule    Sig: Take 1 capsule (300 mg total) by mouth 2 (two) times daily.    Dispense:  60  capsule    Refill:  3   PRN thoracic facet medial branch nerve blocks with thoracic spondylosis Continue with neurology Continue with physical therapy stretching exercises.  Orders:  No orders of the defined types were placed in this encounter.  Follow-up plan:   Return in about 4 months (around 05/02/2021) for Medication Management, in person.        Recent Visits Date Type Provider Dept  10/15/20 Office Visit [name], MD  [facility]  Showing recent visits within past 90 days and meeting all other requirements Today's Visits Date Type Provider Dept  12/31/20 Office Visit [name], MD [facility]  Showing today's visits and meeting all other requirements Future Appointments No visits were found meeting these conditions. Showing future appointments within next 90 days and meeting all other requirements I discussed the assessment and treatment plan with the patient. The patient was provided an opportunity to ask questions and all were answered. The patient agreed with the plan and demonstrated an understanding of the instructions.  Patient advised to call back or seek an in-person evaluation if the symptoms or condition worsens.  Duration of encounter: 89mnutes.  Note by: BGillis Santa MD Date: 12/31/2020; Time: 9:53 AM

## 2020-12-31 NOTE — Progress Notes (Signed)
Safety precautions to be maintained throughout the outpatient stay will include: orient to surroundings, keep bed in low position, maintain call bell within reach at all times, provide assistance with transfer out of bed and ambulation.  

## 2021-01-02 NOTE — Progress Notes (Deleted)
BH MD/PA/NP OP Progress Note  01/02/2021 3:14 PM Ricky [name]  MRN:  [id number]  Chief Complaint:  HPI: *** Visit Diagnosis: No diagnosis found.  Past Psychiatric History: Please see initial evaluation for full details. I have reviewed the history. No updates at this time.     Past Medical History:  Past Medical History:  Diagnosis Date   Anxiety    Carpal tunnel syndrome    Depression    Diabetes (HCC)    Hyperlipidemia    Hypertension     Past Surgical History:  Procedure Laterality Date   CARPAL TUNNEL RELEASE Right 11/28/2020   Procedure: CARPAL TUNNEL RELEASE ENDOSCOPIC;  Surgeon: Christena Flake, MD;  Location: [facility] ORS;  Service: Orthopedics;  Laterality: Right;    Family Psychiatric History: Please see initial evaluation for full details. I have reviewed the history. No updates at this time.     Family History:  Family History  Problem Relation Age of Onset   Depression Sister    Depression Brother     Social History:  Social History   Socioeconomic History   Marital status: Significant Other    Spouse name: [name]   Number of children: Not on file   Years of education: Not on file   Highest education level: Not on file  Occupational History   Not on file  Tobacco Use   Smoking status: Never   Smokeless tobacco: Never  Substance and Sexual Activity   Alcohol use: Never   Drug use: Never   Sexual activity: Not on file  Other Topics Concern   Not on file  Social History Narrative   Lives with friend   Social Determinants of Health   Financial Resource Strain: Not on file  Food Insecurity: Not on file  Transportation Needs: Not on file  Physical Activity: Not on file  Stress: Not on file  Social Connections: Not on file    Allergies:  Allergies  Allergen Reactions   Mirtazapine Rash    Metabolic Disorder Labs: No results found for: HGBA1C, MPG No results found for: PROLACTIN No results found for: CHOL, TRIG, HDL, CHOLHDL, VLDL,  LDLCALC No results found for: TSH  Therapeutic Level Labs: No results found for: LITHIUM No results found for: VALPROATE No components found for:  CBMZ  Current Medications: Current Outpatient Medications  Medication Sig Dispense Refill   ARIPiprazole (ABILIFY) 30 MG tablet Take 30 mg by mouth daily.     atenolol (TENORMIN) 50 MG tablet Take 50 mg by mouth daily.     buPROPion (WELLBUTRIN XL) 150 MG 24 hr tablet Total of 450 mg daily. Take along with 300 mg tab 30 tablet 2   buPROPion (WELLBUTRIN XL) 300 MG 24 hr tablet Total of 450 mg daily. Take along with 150 mg tab 30 tablet 2   carbidopa-levodopa (SINEMET IR) 25-100 MG tablet Take 1 tablet by mouth in the morning, at noon, and at bedtime.     chlorthalidone (HYGROTON) 25 MG tablet Take 25 mg by mouth daily.     clonazePAM (KLONOPIN) 1 MG tablet Take 1 mg by mouth in the morning, at noon, and at bedtime.     DULoxetine (CYMBALTA) 60 MG capsule Take 60 mg by mouth daily.     enalapril (VASOTEC) 10 MG tablet Take 10 mg by mouth daily.     etodolac (LODINE) 500 MG tablet Take 500 mg by mouth 2 (two) times daily.     ezetimibe (ZETIA) 10 MG tablet Take 10  mg by mouth daily.     gabapentin (NEURONTIN) 300 MG capsule Take 1 capsule (300 mg total) by mouth 2 (two) times daily. 60 capsule 3   ketoconazole (NIZORAL) 2 % shampoo Apply 1 application topically every 3 (three) days.     metFORMIN (GLUCOPHAGE) 500 MG tablet Take 500 mg by mouth 2 (two) times daily.     sildenafil (VIAGRA) 50 MG tablet Take 50 mg by mouth daily as needed for erectile dysfunction.     simvastatin (ZOCOR) 20 MG tablet Take 20 mg by mouth daily.     traZODone (DESYREL) 50 MG tablet Take 50 mg by mouth at bedtime.     VICTOZA 18 MG/3ML SOPN Inject 1.2 mg into the skin daily.     No current facility-administered medications for this visit.     Musculoskeletal: Strength & Muscle Tone:  N/A Gait & Station:  N/A Patient leans: N/A  Psychiatric Specialty  Exam: Review of Systems  There were no vitals taken for this visit.There is no height or weight on file to calculate BMI.  General Appearance: {Appearance:22683}  Eye Contact:  {BHH EYE CONTACT:22684}  Speech:  Clear and Coherent  Volume:  Normal  Mood:  {BHH MOOD:22306}  Affect:  {Affect (PAA):22687}  Thought Process:  Coherent  Orientation:  Full (Time, Place, and Person)  Thought Content: Logical   Suicidal Thoughts:  {ST/HT (PAA):22692}  Homicidal Thoughts:  {ST/HT (PAA):22692}  Memory:  Immediate;   Good  Judgement:  {Judgement (PAA):22694}  Insight:  {Insight (PAA):22695}  Psychomotor Activity:  Normal  Concentration:  Concentration: Good and Attention Span: Good  Recall:  Good  Fund of Knowledge: Good  Language: Good  Akathisia:  No  Handed:  Right  AIMS (if indicated): not done  Assets:  Communication Skills Desire for Improvement  ADL's:  Intact  Cognition: WNL  Sleep:  {BHH GOOD/FAIR/POOR:22877}   Screenings: Oceanographer Row Office Visit from 10/15/2020 in [facility] Office Visit from 09/24/2020 in [facility]  PHQ-2 Total Score 0 6  PHQ-9 Total Score -- 19      Flowsheet Row Pre-Admission Testing 45 from 11/22/2020 in [facility] Video Visit from 10/29/2020 in [facility]  C-SSRS RISK CATEGORY No Risk No Risk        Assessment and Plan:  Ricky [name] is a 65 y.o. year old male with a history of depression, anxiety, diabetes, hypertension, hyperlipidemia, carpal tunnel syndrome, s/p knee arthroscopy, who presents for follow up appointment for below.     1. MDD (major depressive disorder), recurrent episode, moderate (HCC) # Unspecified anxiety disorder Although he continues to report depressive symptoms and anxiety, there has been overall improvement since uptitration of bupropion.  Psychosocial stressors  includes demoralization secondary to pain and unemployment.He reports fair relationship with his wife, and would like to go back to work when his mood improves. Will continue duloxetine for depression anxiety. Will continue current dose of bupropion to see if it exerts its full benefit for depression. Will continue Abilify for depression at this time. Noted that he has cogwheel rigidity on exam; will plan to lower the dose in the near future as his mood improves. Discussed potential metabolic side effect and EPS from Abilify. Noted that he has been on clonazepam, prescribed by his PCP.  He is recommended to be tapered off this medication in the future especially given his history of alcohol use.  2. Insomnia, unspecified type Improving. He complains of hypersomnia, fatigue and occasional snoring.  Although he was recommended for sleep evaluation, he wants to hold this at this time. Will continue Trazodone at this time for insomnia.    # Alcohol use disorder in sustained remission He denies any alcohol use/craving for alcohol.  Will continue motivational interview.   Plan 1. Continue duloxetine 120 mg daily  2. Continue bupropion 450 mg daily 3. Continue Abilify 30 mg daily- monitor weight gain, cogwheel rigidity on left arm 4. Continue clonazepam 1 mg three times a day for anxiety- prescribed by PCP 5. Continue trazodone 50 mg at night 6. Next appointment: 8/26 at 11:30 for 30 mins, video - he sees a therapist   Past trials of medication: duloxetine, venlafaxine, Abilify, clonazepam,    The patient demonstrates the following risk factors for suicide: Chronic risk factors for suicide include: psychiatric disorder of depression and chronic pain. Acute risk factors for suicide include: unemployment. Protective factors for this patient include: positive social support, coping skills and hope for the future. Considering these factors, the overall suicide risk at this point appears to be low. Patient  is appropriate for outpatient follow up.              Neysa Hotter, MD 01/02/2021, 3:14 PM

## 2021-01-03 ENCOUNTER — Telehealth: Payer: Self-pay | Admitting: Psychiatry

## 2021-01-03 ENCOUNTER — Other Ambulatory Visit: Payer: Self-pay

## 2021-01-16 ENCOUNTER — Other Ambulatory Visit: Payer: Self-pay | Admitting: Surgery

## 2021-01-21 ENCOUNTER — Encounter
Admission: RE | Admit: 2021-01-21 | Discharge: 2021-01-21 | Disposition: A | Discharge status: Home / Self Care | Payer: Disability Insurance | Source: Ambulatory Visit | Attending: Surgery | Admitting: Surgery

## 2021-01-21 ENCOUNTER — Other Ambulatory Visit: Payer: Self-pay

## 2021-01-21 NOTE — Patient Instructions (Signed)
Your procedure is scheduled on: tomorrow Report to registration desk in the medical mall then Day Surgery. To find out your arrival time please call [phone] between 1PM - 3PM on today.  Remember: Instructions that are not followed completely may result in serious medical risk,  up to and including death, or upon the discretion of your surgeon and anesthesiologist your  surgery may need to be rescheduled.     _X__ 1. Do not eat food after midnight the night before your procedure.                 No chewing gum or hard candies. You may drink clear liquids up to 2 hours                 before you are scheduled to arrive for your surgery- DO not drink clear                 liquids within 2 hours of the start of your surgery.                 Clear Liquids include:  water, Gatorade, G2 or                  Gatorade Zero (avoid Red/Purple/Blue), Black Coffee or Tea (Do not add                 anything to coffee or tea). __x__2.  On the morning of surgery brush your teeth with toothpaste and water, you                may rinse your mouth with mouthwash if you wish.  Do not swallow any toothpaste of mouthwash.     ___ 3.  No Alcohol for 24 hours before or after surgery.   ___ 4.  Do Not Smoke or use e-cigarettes For 24 Hours Prior to Your Surgery.                 Do not use any chewable tobacco products for at least 6 hours prior to                 Surgery.  ___  5.  Do not use any recreational drugs (marijuana, cocaine, heroin, ecstasy, MDMA or other) For at least one week prior to your surgery.    Combination of these drugs with anesthesia may have life threatening results.  ____  6.  Bring all medications with you on the day of surgery if instructed.   _x___  7.  Notify your doctor if there is any change in your medical condition      (cold, fever, infections).     Do not wear jewelry, Do not wear lotions, You may wear deodorant. Do not shave 48 hours prior to  surgery. Men may shave face and neck. Do not bring valuables to the hospital.    [facility] is not responsible for any belongings or valuables.  Contacts, dentures or bridgework may not be worn into surgery. Leave your suitcase in the car. After surgery it may be brought to your room. For patients admitted to the hospital, discharge time is determined by your treatment team.   Patients discharged the day of surgery will not be allowed to drive home.   Make arrangements for someone to be with you for the first 24 hours of your Same Day Discharge.    Please read over the following fact sheets that you were given:  __x__ Take these medicines the morning of surgery with A SIP OF WATER:    1. atenolol (TENORMIN) 50 MG tablet  2. buPROPion (WELLBUTRIN XL) 150 MG 24 hr tabl  3. carbidopa-levodopa (SINEMET IR) 25-100 MG tablet  4.clonazePAM (KLONOPIN) 1 MG tablet  5.DULoxetine (CYMBALTA) 60 MG capsule  6.ezetimibe (ZETIA) 10 MG tablet             7.gabapentin (NEURONTIN) 300 MG capsule             8.simvastatin (ZOCOR) 20 MG tablet  Do not take Enalapril or Chlorthalidone tomorrow  ____ Fleet Enema (as directed)   __x__ shower tonight and tomorrow morning put clean sheets on your bed.Use CHG Soap (or wipes) as directed  ____ Use Benzoyl Peroxide Gel as instructed  ____ Use inhalers on the day of surgery  __x__ Stop metformin now.  May take Victoza today but not tomorrow   ____ Take 1/2 of usual insulin dose the night before surgery. No insulin the morning          of surgery.   ____ Stop Coumadin/Plavix/aspirin on   __x__ Stop Anti-inflammatories etodolac (LODINE) 500 MG tablet, ibuprofen and aleve today  May take tylenol    ____ Stop supplements until after surgery.    ____ Bring C-Pap to the hospital.   If you have any questions regarding your pre-procedure instructions,  Please call Pre-admit Testing at  [phone]

## 2021-01-22 ENCOUNTER — Ambulatory Visit: Payer: Commercial Managed Care - PPO | Admitting: Anesthesiology

## 2021-01-22 ENCOUNTER — Ambulatory Visit
Admission: RE | Admit: 2021-01-22 | Discharge: 2021-01-22 | Disposition: A | Discharge status: Home / Self Care | Payer: Commercial Managed Care - PPO | Attending: Surgery | Admitting: Surgery

## 2021-01-22 ENCOUNTER — Encounter
Admission: RE | Disposition: A | Discharge status: Home / Self Care | Payer: Self-pay | Source: Home / Self Care | Attending: Surgery

## 2021-01-22 ENCOUNTER — Encounter: Payer: Self-pay | Admitting: Surgery

## 2021-01-22 DIAGNOSIS — G5602 Carpal tunnel syndrome, left upper limb: Secondary | ICD-10-CM | POA: Diagnosis not present

## 2021-01-22 DIAGNOSIS — Z79899 Other long term (current) drug therapy: Secondary | ICD-10-CM | POA: Diagnosis not present

## 2021-01-22 DIAGNOSIS — Z7984 Long term (current) use of oral hypoglycemic drugs: Secondary | ICD-10-CM | POA: Diagnosis not present

## 2021-01-22 HISTORY — PX: CARPAL TUNNEL RELEASE: SHX101

## 2021-01-22 LAB — GLUCOSE, CAPILLARY
Glucose-Capillary: 176 mg/dL — ABNORMAL HIGH (ref 70–99)
Glucose-Capillary: 171 mg/dL — ABNORMAL HIGH (ref 70–99)

## 2021-01-22 SURGERY — RELEASE, CARPAL TUNNEL, ENDOSCOPIC
Anesthesia: General | Site: Wrist | Laterality: Left

## 2021-01-22 MED ORDER — KETOROLAC TROMETHAMINE 30 MG/ML IJ SOLN
INTRAMUSCULAR | Status: AC
  Filled 2021-01-22: qty 1

## 2021-01-22 MED ORDER — CHLORHEXIDINE GLUCONATE 0.12 % MT SOLN: 15.0000 mL | Freq: Once | OROMUCOSAL | Status: AC

## 2021-01-22 MED ORDER — LIDOCAINE HCL (CARDIAC) PF 100 MG/5ML IV SOSY
PREFILLED_SYRINGE | INTRAVENOUS | Status: DC | PRN
  Administered 2021-01-22: 60 mg via INTRAVENOUS

## 2021-01-22 MED ORDER — FAMOTIDINE 20 MG PO TABS: 20.0000 mg | ORAL_TABLET | Freq: Once | ORAL | Status: AC

## 2021-01-22 MED ORDER — FAMOTIDINE 20 MG PO TABS
ORAL_TABLET | ORAL | Status: AC
  Administered 2021-01-22: 20 mg via ORAL
  Filled 2021-01-22: qty 1

## 2021-01-22 MED ORDER — MIDAZOLAM HCL 2 MG/2ML IJ SOLN
INTRAMUSCULAR | Status: AC
  Filled 2021-01-22: qty 2

## 2021-01-22 MED ORDER — PROPOFOL 10 MG/ML IV BOLUS
INTRAVENOUS | Status: DC | PRN
  Administered 2021-01-22: 150 mg via INTRAVENOUS
  Administered 2021-01-22: 30 mg via INTRAVENOUS

## 2021-01-22 MED ORDER — KETOROLAC TROMETHAMINE 30 MG/ML IJ SOLN
INTRAMUSCULAR | Status: DC | PRN
  Administered 2021-01-22: 30 mg via INTRAVENOUS

## 2021-01-22 MED ORDER — FENTANYL CITRATE (PF) 100 MCG/2ML IJ SOLN
INTRAMUSCULAR | Status: AC
  Filled 2021-01-22: qty 2

## 2021-01-22 MED ORDER — SODIUM CHLORIDE 0.9 % IV SOLN: INTRAVENOUS | Status: DC

## 2021-01-22 MED ORDER — ONDANSETRON HCL 4 MG/2ML IJ SOLN
INTRAMUSCULAR | Status: DC | PRN
  Administered 2021-01-22: 4 mg via INTRAVENOUS

## 2021-01-22 MED ORDER — DEXAMETHASONE SODIUM PHOSPHATE 10 MG/ML IJ SOLN
INTRAMUSCULAR | Status: AC
  Filled 2021-01-22: qty 1

## 2021-01-22 MED ORDER — 0.9 % SODIUM CHLORIDE (POUR BTL) OPTIME
TOPICAL | Status: DC | PRN
  Administered 2021-01-22: 250 mL

## 2021-01-22 MED ORDER — ONDANSETRON HCL 4 MG PO TABS: 4.0000 mg | ORAL_TABLET | Freq: Four times a day (QID) | ORAL | Status: DC | PRN

## 2021-01-22 MED ORDER — ONDANSETRON HCL 4 MG/2ML IJ SOLN
INTRAMUSCULAR | Status: AC
  Filled 2021-01-22: qty 2

## 2021-01-22 MED ORDER — METOCLOPRAMIDE HCL 5 MG/ML IJ SOLN: 5.0000 mg | Freq: Three times a day (TID) | INTRAMUSCULAR | Status: DC | PRN

## 2021-01-22 MED ORDER — MIDAZOLAM HCL 2 MG/2ML IJ SOLN
INTRAMUSCULAR | Status: DC | PRN
  Administered 2021-01-22: 2 mg via INTRAVENOUS

## 2021-01-22 MED ORDER — PROPOFOL 10 MG/ML IV BOLUS
INTRAVENOUS | Status: AC
  Filled 2021-01-22: qty 20

## 2021-01-22 MED ORDER — CEFAZOLIN SODIUM-DEXTROSE 2-4 GM/100ML-% IV SOLN
INTRAVENOUS | Status: AC
  Filled 2021-01-22: qty 100

## 2021-01-22 MED ORDER — ORAL CARE MOUTH RINSE: 15.0000 mL | Freq: Once | OROMUCOSAL | Status: AC

## 2021-01-22 MED ORDER — METOCLOPRAMIDE HCL 10 MG PO TABS: 5.0000 mg | ORAL_TABLET | Freq: Three times a day (TID) | ORAL | Status: DC | PRN

## 2021-01-22 MED ORDER — CHLORHEXIDINE GLUCONATE 0.12 % MT SOLN
OROMUCOSAL | Status: AC
  Administered 2021-01-22: 15 mL via OROMUCOSAL
  Filled 2021-01-22: qty 15

## 2021-01-22 MED ORDER — LIDOCAINE HCL (PF) 2 % IJ SOLN
INTRAMUSCULAR | Status: AC
  Filled 2021-01-22: qty 5

## 2021-01-22 MED ORDER — DEXAMETHASONE SODIUM PHOSPHATE 10 MG/ML IJ SOLN
INTRAMUSCULAR | Status: DC | PRN
  Administered 2021-01-22: 10 mg via INTRAVENOUS

## 2021-01-22 MED ORDER — BUPIVACAINE HCL (PF) 0.5 % IJ SOLN
INTRAMUSCULAR | Status: AC
  Filled 2021-01-22: qty 30

## 2021-01-22 MED ORDER — CEFAZOLIN SODIUM-DEXTROSE 2-4 GM/100ML-% IV SOLN
2.0000 g | INTRAVENOUS | Status: AC
  Administered 2021-01-22: 2 g via INTRAVENOUS

## 2021-01-22 MED ORDER — ONDANSETRON HCL 4 MG/2ML IJ SOLN: 4.0000 mg | Freq: Four times a day (QID) | INTRAMUSCULAR | Status: DC | PRN

## 2021-01-22 MED ORDER — BUPIVACAINE HCL (PF) 0.5 % IJ SOLN
INTRAMUSCULAR | Status: DC | PRN
  Administered 2021-01-22: 10 mL

## 2021-01-22 MED ORDER — FENTANYL CITRATE (PF) 100 MCG/2ML IJ SOLN
INTRAMUSCULAR | Status: DC | PRN
  Administered 2021-01-22 (×4): 25 ug via INTRAVENOUS

## 2021-01-22 SURGICAL SUPPLY — 33 items
APL PRP STRL LF DISP 70% ISPRP (MISCELLANEOUS) ×1
BNDG COHESIVE 4X5 TAN ST LF (GAUZE/BANDAGES/DRESSINGS) ×2 IMPLANT
BNDG ELASTIC 2X5.8 VLCR STR LF (GAUZE/BANDAGES/DRESSINGS) ×2 IMPLANT
BNDG ESMARK 4X12 TAN STRL LF (GAUZE/BANDAGES/DRESSINGS) ×2 IMPLANT
CHLORAPREP W/TINT 26 (MISCELLANEOUS) ×2 IMPLANT
CORD BIP STRL DISP 12FT (MISCELLANEOUS) ×2 IMPLANT
CUFF TOURN SGL QUICK 18X4 (TOURNIQUET CUFF) IMPLANT
DRAPE SURG 17X11 SM STRL (DRAPES) ×2 IMPLANT
FORCEPS JEWEL BIP 4-3/4 STR (INSTRUMENTS) ×2 IMPLANT
GAUZE 4X4 16PLY ~~LOC~~+RFID DBL (SPONGE) ×2 IMPLANT
GAUZE SPONGE 4X4 12PLY STRL (GAUZE/BANDAGES/DRESSINGS) ×2 IMPLANT
GAUZE XEROFORM 1X8 LF (GAUZE/BANDAGES/DRESSINGS) ×2 IMPLANT
GLOVE SURG ENC MOIS LTX SZ8 (GLOVE) ×12 IMPLANT
GLOVE SURG UNDER LTX SZ8 (GLOVE) ×6 IMPLANT
GOWN STRL REUS W/ TWL LRG LVL3 (GOWN DISPOSABLE) ×1 IMPLANT
GOWN STRL REUS W/ TWL XL LVL3 (GOWN DISPOSABLE) ×1 IMPLANT
GOWN STRL REUS W/TWL LRG LVL3 (GOWN DISPOSABLE) ×2
GOWN STRL REUS W/TWL XL LVL3 (GOWN DISPOSABLE) ×2
KIT CARPAL TUNNEL (MISCELLANEOUS) ×2
KIT ESCP INSRT D SLOT CANN KN (MISCELLANEOUS) ×1 IMPLANT
KIT TURNOVER KIT A (KITS) ×2 IMPLANT
MANIFOLD NEPTUNE II (INSTRUMENTS) ×2 IMPLANT
NS IRRIG 500ML POUR BTL (IV SOLUTION) ×2 IMPLANT
PACK EXTREMITY ARMC (MISCELLANEOUS) ×2 IMPLANT
SPLINT WRIST LG LT TX990309 (SOFTGOODS) ×2 IMPLANT
SPLINT WRIST LG RT TX900304 (SOFTGOODS) IMPLANT
SPLINT WRIST M LT TX990308 (SOFTGOODS) IMPLANT
SPLINT WRIST M RT TX990303 (SOFTGOODS) IMPLANT
SPLINT WRIST XL LT TX990310 (SOFTGOODS) IMPLANT
SPLINT WRIST XL RT TX990305 (SOFTGOODS) IMPLANT
STOCKINETTE IMPERVIOUS 9X36 MD (GAUZE/BANDAGES/DRESSINGS) ×2 IMPLANT
SUT PROLENE 4 0 PS 2 18 (SUTURE) ×2 IMPLANT
WATER STERILE IRR 500ML POUR (IV SOLUTION) ×2 IMPLANT

## 2021-01-22 NOTE — Discharge Instructions (Addendum)
Orthopedic discharge instructions: Keep dressing dry and intact. Keep hand elevated above heart level. May shower after dressing removed on postop day 4 (Sunday). Cover sutures with Band-Aids after drying off then reapply Velcro splint. Apply ice to affected area frequently. Take ibuprofen 600-800 mg TID with meals for 7-10 days, then as necessary. Take ES Tylenol or pain medication as prescribed when needed.  Return for follow-up in 10-14 days or as scheduled.   AMBULATORY SURGERY  DISCHARGE INSTRUCTIONS   The drugs that you were given will stay in your system until tomorrow so for the next 24 hours you should not:  Drive an automobile Make any legal decisions Drink any alcoholic beverage   You may resume regular meals tomorrow.  Today it is better to start with liquids and gradually work up to solid foods.  You may eat anything you prefer, but it is better to start with liquids, then soup and crackers, and gradually work up to solid foods.   Please notify your doctor immediately if you have any unusual bleeding, trouble breathing, redness and pain at the surgery site, drainage, fever, or pain not relieved by medication.    Additional Instructions:        Please contact your physician with any problems or Same Day Surgery at [phone], Monday through Friday 6 am to 4 pm, or [place] at [facility] number at [phone].

## 2021-01-22 NOTE — Transfer of Care (Signed)
Immediate Anesthesia Transfer of Care Note  Patient: Ricky [name]  Procedure(s) Performed: CARPAL TUNNEL RELEASE ENDOSCOPIC (Left: Wrist)  Patient Location: PACU  Anesthesia Type:General  Level of Consciousness: drowsy and responds to stimulation  Airway & Oxygen Therapy: Patient Spontanous Breathing  Post-op Assessment: Report given to RN  Post vital signs: Reviewed  Last Vitals:  Vitals Value Taken Time  BP 116/76 01/22/21 0954  Temp    Pulse 72 01/22/21 0957  Resp 16 01/22/21 0957  SpO2 98 % 01/22/21 0957  Vitals shown include unvalidated device data.  Last Pain:  Vitals:   01/22/21 0714  TempSrc: Temporal  PainSc: 0-No pain         Complications: No notable events documented.

## 2021-01-22 NOTE — H&P (Signed)
History of Present Illness:  Ricky [name]. is a 65 y.o. male who presents for evaluation and treatment of persistent numbness and paresthesias to his left hand. The symptoms will awaken him from sleep at night. He has been taking Lodine on a daily basis with limited benefit. He has difficulty with repetitive grasping activities and with grasping objects. He is ready to consider more aggressive treatment options for these symptoms.  Current Outpatient Medications:  albuterol 90 mcg/actuation inhaler Inhale 2 inhalations into the lungs every 6 (six) hours as needed for Wheezing 18 g 1   ARIPiprazole (ABILIFY) 30 MG tablet Take 1 tablet (30 mg total) by mouth once daily 30 tablet 11   atenolol (TENORMIN) 50 MG tablet Take 50 mg by mouth once daily   buPROPion (WELLBUTRIN SR) 150 MG SR tablet Take 1 tablet (150 mg total) by mouth 2 (two) times daily 180 tablet 3   carbidopa-levodopa (SINEMET) 25-100 mg tablet Take 1 tablet three time a day 270 tablet 0   chlorthalidone 25 MG tablet Take 25 mg by mouth once daily   clonazePAM (KLONOPIN) 1 MG tablet TAKE 1 TABLET BY MOUTH 3 TIMES DAILY AS NEEDED FOR ANXIETY. 90 tablet 5   diclofenac potassium (CATAFLAM) 50 mg tablet TAKE 1.5 TABLETS (75 MG TOTAL) BY MOUTH 2 (TWO) TIMES DAILY WITH MEALS 60 tablet 0   DULoxetine (CYMBALTA) 30 MG DR capsule Take 2 capsules (60 mg total) by mouth once daily 180 capsule 3   enalapril (VASOTEC) 10 MG tablet Take 10 mg by mouth once daily   etodolac (LODINE) 500 MG tablet Take 1 tablet (500 mg total) by mouth 2 (two) times daily as needed   ezetimibe (ZETIA) 10 mg tablet Take 10 mg by mouth once daily   gabapentin (NEURONTIN) 100 MG capsule TAKE 1 CAPSULE AT BEDTIME FOR 7 DAYS,2 CAPS AT BEDTIME FOR 7 DAYS,3 CAPSULES AT BEDTIME.   hydrocodone-chlorpheniramine (TUSSIONEX) 10-8 mg/5 mL ER suspension Take 5 mLs by mouth every 12 (twelve) hours as needed for Cough (Patient not taking: Reported on 12/26/2020) 115 mL 0    ketoconazole (NIZORAL) 2 % shampoo Apply topically once daily as needed (facial rash) Leave on for 5 minutes, then rinse. 120 mL 5   metFORMIN (GLUCOPHAGE) 500 MG tablet Take 500 mg by mouth 2 (two) times daily with meals 1 tablets by mouth in morning and 1 tablets at night   sildenafiL (VIAGRA) 50 MG tablet Take 1 tablet (50 mg total) by mouth once daily as needed for Erectile Dysfunction 10 tablet 11   simvastatin (ZOCOR) 20 MG tablet Take 20 mg by mouth every evening   traZODone (DESYREL) 50 MG tablet Take 50 mg by mouth nightly   VICTOZA 3-PAK 0.6 mg/0.1 mL (18 mg/3 mL) pen injector INJECT 1.2 MG UNDER THE SKIN ONCE DAILY   Allergies:   Mirtazapine (Bulk) Rash   Past Medical History:   Anxiety disorder   Depression   Diabetes mellitus type 2, uncomplicated (CMS-HCC)   Hyperlipidemia   Hypertension   Past Surgical History:   Endoscopic right carpal tunnel release Right 11/28/2020 (Dr. Joice Lofts)   FRACTURE SURGERY Right (Index finger had pins inserted then removed)   KNEE ARTHROSCOPY Right 1975 x 2   Family History:   High blood pressure (Hypertension) Mother   Diabetes type II Mother   Depression Mother   High blood pressure (Hypertension) Father   Depression Sister   Depression Brother   Diabetes type II Brother   Social  History:   Socioeconomic History:   Marital status: Single  Occupational History   Occupation: Curator  Tobacco Use   Smoking status: Never Smoker   Smokeless tobacco: Never Used  Building services engineer Use: Never used  Substance and Sexual Activity   Alcohol use: Yes  Alcohol/week: 15.0 standard drinks  Types: 15 Cans of beer per week   Drug use: No   Sexual activity: Defer   Review of Systems:  A comprehensive 14 point ROS was performed, reviewed, and the pertinent orthopaedic findings are documented in the HPI.  Physical Exam: Vitals:  01/15/21 1014  BP: 128/86  Weight: 87.3 kg (192 lb 6.4 oz)  Height: 175.3 cm (5\' 9" )  PainSc: 1  PainLoc:  Wrist   General/Constitutional: The patient appears to be well-nourished, well-developed, and in no acute distress. Neuro/Psych: Normal mood and affect, oriented to person, place and time.  Eyes: Non-icteric. Pupils are equal, round, and reactive to light, and exhibit synchronous movement. ENT: Unremarkable. Lymphatic: No palpable adenopathy. Respiratory: Lungs clear to auscultation, Normal chest excursion, No wheezes and Non-labored breathing Cardiovascular: Regular rate and rhythm. No murmurs. and No edema, swelling or tenderness, except as noted in detailed exam. Integumentary: No impressive skin lesions present, except as noted in detailed exam. Musculoskeletal: Unremarkable, except as noted in detailed exam.  Left wrist/hand exam: Skin inspection of the left wrist and hand remains unremarkable. No swelling, erythema, ecchymosis, abrasions, or other skin abnormalities are identified. There is no tenderness to palpation over the dorsal or palmar aspects of the hand. Wrist motion is smooth and pain-free. He is able to actively flex and extend all digits without any pain or triggering. He experiences difficulty making a full fist with this hand, lacking 1-2 centimeters from achieving his fingertips to the proximal palmar crease. He again is neurovascularly intact to all digits, although grip strength is decreased at 4/5, primarily due to his inability to close his hand fully. Intrinsic muscle testing also is decreased at 4-4+/5. He has intact sensation to the tips of all digits. He exhibits a negative Phalen's test and a negative Tinel's over the left carpal tunnel.  Assessment:   Carpal tunnel syndrome, left   Plan: The treatment options were discussed with the patient. In addition, patient educational materials were provided regarding the diagnosis and treatment options. Regarding his left wrist and hand symptoms, the patient is frustrated by the persistent symptoms and functional limitations,  and is ready to consider more aggressive treatment options. Therefore, I have recommended a surgical procedure, specifically an endoscopic left carpal tunnel release. The procedure was discussed with the patient, as were the potential risks (including bleeding, infection, nerve and/or blood vessel injury, persistent or recurrent pain/paresthesias, weakness of grip, need for further surgery, blood clots, strokes, heart attacks and/or arhythmias, pneumonia, etc.) and benefits. The patient states his/her understanding and wishes to proceed. All of the patient's questions and concerns were answered. He can call any time with further concerns. He will follow up post-surgery, routine.   H&P reviewed and patient re-examined. No changes.

## 2021-01-22 NOTE — Anesthesia Preprocedure Evaluation (Signed)
Anesthesia Evaluation  Patient identified by MRN, date of birth, ID band Patient awake    Reviewed: Allergy & Precautions, H&P , NPO status , Patient's Chart, lab work & pertinent test results, reviewed documented beta blocker date and time   Airway Mallampati: II  TM Distance: >3 FB Neck ROM: full    Dental  (+) Teeth Intact   Pulmonary neg pulmonary ROS,    Pulmonary exam normal        Cardiovascular Exercise Tolerance: Good hypertension, On Medications negative cardio ROS Normal cardiovascular exam Rate:Normal     Neuro/Psych negative neurological ROS  negative psych ROS   GI/Hepatic negative GI ROS, Neg liver ROS,   Endo/Other  negative endocrine ROSdiabetes  Renal/GU negative Renal ROS  negative genitourinary   Musculoskeletal   Abdominal   Peds  Hematology negative hematology ROS (+)   Anesthesia Other Findings   Reproductive/Obstetrics negative OB ROS                             Anesthesia Physical Anesthesia Plan  ASA: 2  Anesthesia Plan: General LMA   Post-op Pain Management:    Induction:   PONV Risk Score and Plan:   Airway Management Planned:   Additional Equipment:   Intra-op Plan:   Post-operative Plan:   Informed Consent: I have reviewed the patients History and Physical, chart, labs and discussed the procedure including the risks, benefits and alternatives for the proposed anesthesia with the patient or authorized representative who has indicated his/her understanding and acceptance.       Plan Discussed with: CRNA  Anesthesia Plan Comments:         Anesthesia Quick Evaluation

## 2021-01-22 NOTE — Op Note (Signed)
01/22/2021  10:14 AM  Patient:   Ricky [name]  Pre-Op Diagnosis:   Left carpal tunnel syndrome.  Post-Op Diagnosis:   Same.  Procedure:   Endoscopic left carpal tunnel release.  Surgeon:   Maryagnes Amos, MD  Assistant:   Frederic Jericho, PA-S  Anesthesia:   General LMA  Findings:   As above.  Complications:   None  EBL:   0 cc  Fluids:   400 cc crystalloid  TT:   28 minutes at 250 mmHg  Drains:   None  Closure:   4-0 Prolene interrupted sutures  Brief Clinical Note:   The patient is a 65 year old male with a long history of progressively worsening pain and paresthesias to his left hand. His symptoms have progressed despite medications, activity modification, etc. His history and examination are consistent with carpal tunnel syndrome, confirmed by EMG. He is status post an endoscopic right carpal tunnel release several months ago, from which she has done quite well. The patient presents at this time for an endoscopic left carpal tunnel release.   Procedure:   The patient was brought into the operating room and lain in the supine position. After adequate general laryngeal mask anesthesia was obtained, the left hand and upper extremity were prepped with ChloraPrep solution before being draped sterilely. Preoperative antibiotics were administered. A timeout was performed to verify the appropriate surgical site before the limb was exsanguinated with an Esmarch and the tourniquet inflated to 250 mmHg. An approximately 1.5-2 cm incision was made over the volar wrist flexion crease, centered over the palmaris longus tendon. The incision was carried down through the subcutaneous tissues with care taken to identify and protect any neurovascular structures. The distal forearm fascia was penetrated just proximal to the transverse carpal ligament. The soft tissues were released off the superficial and deep surfaces of the distal forearm fascia and this was released proximally for 3-4 cm under direct  visualization.  Attention was directed distally. The Therapist, nutritional was passed beneath the transverse carpal ligament along the ulnar aspect of the carpal tunnel and used to release any adhesions as well as to remove any adherent synovial tissue before first the smaller then the larger of the two dilators were passed beneath the transverse carpal ligament along the ulnar margin of the carpal tunnel. The slotted cannula was introduced and the endoscope was placed into the slotted cannula and the undersurface of the transverse carpal ligament visualized. The distal margin of the transverse carpal ligament was marked by placing a 25-gauge needle percutaneously at Kaplan's cardinal point so that it entered the distal portion of the slotted cannula. Under endoscopic visualization, the transverse carpal ligament was released from proximal to distal using the end-cutting blade. A second pass was performed to ensure complete release of the ligament. The adequacy of release was verified both endoscopically and by palpation using the freer elevator.  The wound was irrigated thoroughly with sterile saline solution before being closed using 4-0 Prolene interrupted sutures. A total of 10 cc of 0.5% plain Sensorcaine was injected in and around the incision before a sterile bulky dressing was applied to the wound. The patient was placed into a volar wrist splint before being awakened, extubated, and returned to the recovery room in satisfactory condition after tolerating the procedure well.

## 2021-01-22 NOTE — Anesthesia Procedure Notes (Signed)
Procedure Name: LMA Insertion Date/Time: 01/22/2021 9:01 AM Performed by: Weber Cooks, CRNA Pre-anesthesia Checklist: Patient identified, Patient being monitored, Timeout performed, Emergency Drugs available and Suction available Patient Re-evaluated:Patient Re-evaluated prior to induction Oxygen Delivery Method: Circle system utilized Preoxygenation: Pre-oxygenation with 100% oxygen Induction Type: IV induction Ventilation: Mask ventilation without difficulty LMA: LMA inserted LMA Size: 4.5 Tube type: Oral Number of attempts: 1 Placement Confirmation: positive ETCO2 and breath sounds checked- equal and bilateral Tube secured with: Tape Dental Injury: Teeth and Oropharynx as per pre-operative assessment

## 2021-01-24 NOTE — Anesthesia Postprocedure Evaluation (Signed)
Anesthesia Post Note  Patient: Ricky [name]  Procedure(s) Performed: CARPAL TUNNEL RELEASE ENDOSCOPIC (Left: Wrist)  Patient location during evaluation: PACU Anesthesia Type: General Level of consciousness: awake and alert Pain management: pain level controlled Vital Signs Assessment: post-procedure vital signs reviewed and stable Respiratory status: spontaneous breathing, nonlabored ventilation, respiratory function stable and patient connected to nasal cannula oxygen Cardiovascular status: blood pressure returned to baseline and stable Postop Assessment: no apparent nausea or vomiting Anesthetic complications: no   No notable events documented.   Last Vitals:  Vitals:   01/22/21 1030 01/22/21 1038  BP: 133/86 (!) 143/75  Pulse: 78 75  Resp: 14 16  Temp: (!) 36.3 C (!) 36.2 C  SpO2: 95% 95%    Last Pain:  Vitals:   01/23/21 0843  TempSrc:   PainSc: 0-No pain                 Yevette Edwards

## 2021-02-14 NOTE — Progress Notes (Deleted)
BH MD/PA/NP OP Progress Note  02/14/2021 12:05 PM Ricky [name]  MRN:  [id number]  Chief Complaint:  HPI: *** Visit Diagnosis: No diagnosis found.  Past Psychiatric History: Please see initial evaluation for full details. I have reviewed the history. No updates at this time.     Past Medical History:  Past Medical History:  Diagnosis Date   Anxiety    Carpal tunnel syndrome    Depression    Diabetes (HCC)    Hyperlipidemia    Hypertension     Past Surgical History:  Procedure Laterality Date   CARPAL TUNNEL RELEASE Right 11/28/2020   Procedure: CARPAL TUNNEL RELEASE ENDOSCOPIC;  Surgeon: Christena Flake, MD;  Location: [facility] ORS;  Service: Orthopedics;  Laterality: Right;   CARPAL TUNNEL RELEASE Left 01/22/2021   Procedure: CARPAL TUNNEL RELEASE ENDOSCOPIC;  Surgeon: Christena Flake, MD;  Location: [facility] ORS;  Service: Orthopedics;  Laterality: Left;   KNEE ARTHROSCOPY Right     Family Psychiatric History: Please see initial evaluation for full details. I have reviewed the history. No updates at this time.     Family History:  Family History  Problem Relation Age of Onset   Depression Sister    Depression Brother     Social History:  Social History   Socioeconomic History   Marital status: Significant Other    Spouse name: [name]   Number of children: Not on file   Years of education: Not on file   Highest education level: Not on file  Occupational History   Not on file  Tobacco Use   Smoking status: Never   Smokeless tobacco: Never  Vaping Use   Vaping Use: Never used  Substance and Sexual Activity   Alcohol use: Never   Drug use: Never   Sexual activity: Not on file  Other Topics Concern   Not on file  Social History Narrative   Lives with friend   Social Determinants of Health   Financial Resource Strain: Not on file  Food Insecurity: Not on file  Transportation Needs: Not on file  Physical Activity: Not on file  Stress: Not on file  Social  Connections: Not on file    Allergies:  Allergies  Allergen Reactions   Mirtazapine Rash    Metabolic Disorder Labs: No results found for: HGBA1C, MPG No results found for: PROLACTIN No results found for: CHOL, TRIG, HDL, CHOLHDL, VLDL, LDLCALC No results found for: TSH  Therapeutic Level Labs: No results found for: LITHIUM No results found for: VALPROATE No components found for:  CBMZ  Current Medications: Current Outpatient Medications  Medication Sig Dispense Refill   ARIPiprazole (ABILIFY) 30 MG tablet Take 30 mg by mouth daily.     atenolol (TENORMIN) 50 MG tablet Take 50 mg by mouth daily.     buPROPion (WELLBUTRIN XL) 150 MG 24 hr tablet Total of 450 mg daily. Take along with 300 mg tab 30 tablet 2   buPROPion (WELLBUTRIN XL) 300 MG 24 hr tablet Total of 450 mg daily. Take along with 150 mg tab 30 tablet 2   carbidopa-levodopa (SINEMET IR) 25-100 MG tablet Take 1 tablet by mouth in the morning, at noon, and at bedtime.     chlorthalidone (HYGROTON) 25 MG tablet Take 25 mg by mouth daily.     clonazePAM (KLONOPIN) 1 MG tablet Take 1 mg by mouth in the morning, at noon, and at bedtime.     DULoxetine (CYMBALTA) 60 MG capsule Take 60 mg by mouth  daily.     enalapril (VASOTEC) 10 MG tablet Take 10 mg by mouth daily.     etodolac (LODINE) 500 MG tablet Take 500 mg by mouth 2 (two) times daily.     ezetimibe (ZETIA) 10 MG tablet Take 10 mg by mouth daily.     gabapentin (NEURONTIN) 300 MG capsule Take 1 capsule (300 mg total) by mouth 2 (two) times daily. 60 capsule 3   ketoconazole (NIZORAL) 2 % shampoo Apply 1 application topically every other day.     metFORMIN (GLUCOPHAGE) 500 MG tablet Take 500 mg by mouth 2 (two) times daily.     sildenafil (VIAGRA) 50 MG tablet Take 50 mg by mouth daily as needed for erectile dysfunction.     simvastatin (ZOCOR) 20 MG tablet Take 20 mg by mouth daily.     traZODone (DESYREL) 50 MG tablet Take 50 mg by mouth at bedtime.     VICTOZA 18  MG/3ML SOPN Inject 1.2 mg into the skin daily.     No current facility-administered medications for this visit.     Musculoskeletal: Strength & Muscle Tone:  N/A Gait & Station:  N/A Patient leans: N/A  Psychiatric Specialty Exam: Review of Systems  There were no vitals taken for this visit.There is no height or weight on file to calculate BMI.  General Appearance: Fairly Groomed  Eye Contact:  Good  Speech:  Clear and Coherent  Volume:  Normal  Mood:  {BHH MOOD:22306}  Affect:  {Affect (PAA):22687}  Thought Process:  Coherent  Orientation:  Full (Time, Place, and Person)  Thought Content: Logical   Suicidal Thoughts:  {ST/HT (PAA):22692}  Homicidal Thoughts:  {ST/HT (PAA):22692}  Memory:  Immediate;   Good  Judgement:  {Judgement (PAA):22694}  Insight:  {Insight (PAA):22695}  Psychomotor Activity:  Normal  Concentration:  Concentration: Good and Attention Span: Good  Recall:  Good  Fund of Knowledge: Good  Language: Good  Akathisia:  No  Handed:  Right  AIMS (if indicated): not done  Assets:  Communication Skills Desire for Improvement  ADL's:  Intact  Cognition: WNL  Sleep:  {BHH GOOD/FAIR/POOR:22877}   Screenings: Oceanographer Row Office Visit from 10/15/2020 in [facility] Office Visit from 09/24/2020 in [facility]  PHQ-2 Total Score 0 6  PHQ-9 Total Score -- 19      Flowsheet Row Pre-Admission Testing 45 from 01/21/2021 in [facility] PRE [facility]MISS[facility] TEST[facility] Pre-Admission Testing 45 from 11/22/2020 in [facility] Video Visit from 10/29/2020 in [facility]  C-SSRS RISK CATEGORY No Risk No Risk No Risk        Assessment and Plan:  Ricky [name] is a 65 y.o. year old male with a history of depression, anxiety, diabetes, hypertension, hyperlipidemia, carpal tunnel syndrome, s/p knee arthroscopy,  who presents for follow up appointment for below.     1. MDD (major depressive disorder), recurrent episode, moderate (HCC) # Unspecified anxiety disorder Although he continues to report depressive symptoms and anxiety, there has been overall improvement since uptitration of bupropion.  Psychosocial stressors includes demoralization secondary to pain and unemployment.He reports fair relationship with his wife, and would like to go back to work when his mood improves. Will continue duloxetine for depression anxiety. Will continue current dose of bupropion to see if it exerts its full benefit for depression. Will continue Abilify for depression at this time. Noted that he has  cogwheel rigidity on exam; will plan to lower the dose in the near future as his mood improves. Discussed potential metabolic side effect and EPS from Abilify. Noted that he has been on clonazepam, prescribed by his PCP.  He is recommended to be tapered off this medication in the future especially given his history of alcohol use.   2. Insomnia, unspecified type Improving. He complains of hypersomnia, fatigue and occasional snoring.  Although he was recommended for sleep evaluation, he wants to hold this at this time. Will continue Trazodone at this time for insomnia.    # Alcohol use disorder in sustained remission He denies any alcohol use/craving for alcohol.  Will continue motivational interview.   Plan 1. Continue duloxetine 120 mg daily  2. Continue bupropion 450 mg daily 3. Continue Abilify 30 mg daily- monitor weight gain, cogwheel rigidity on left arm 4. Continue clonazepam 1 mg three times a day for anxiety- prescribed by PCP 5. Continue trazodone 50 mg at night 6. Next appointment: 8/26 at 11:30 for 30 mins, video - he sees a therapist   Past trials of medication: duloxetine, venlafaxine, Abilify, clonazepam,    The patient demonstrates the following risk factors for suicide: Chronic risk factors for suicide  include: psychiatric disorder of depression and chronic pain. Acute risk factors for suicide include: unemployment. Protective factors for this patient include: positive social support, coping skills and hope for the future. Considering these factors, the overall suicide risk at this point appears to be low. Patient is appropriate for outpatient follow up.            Neysa Hotter, MD 02/14/2021, 12:05 PM

## 2021-02-17 ENCOUNTER — Telehealth: Payer: Commercial Managed Care - PPO | Admitting: Psychiatry

## 2021-02-25 ENCOUNTER — Telehealth: Payer: Self-pay

## 2021-02-25 ENCOUNTER — Other Ambulatory Visit: Payer: Self-pay | Admitting: Psychiatry

## 2021-02-25 MED ORDER — BUPROPION HCL ER (XL) 300 MG PO TB24: 300.0000 mg | ORAL_TABLET | Freq: Every day | ORAL | 0 refills | Status: DC

## 2021-02-25 MED ORDER — BUPROPION HCL ER (XL) 150 MG PO TB24: 150.0000 mg | ORAL_TABLET | Freq: Every day | ORAL | 0 refills | Status: DC

## 2021-02-25 NOTE — Telephone Encounter (Signed)
Ordered both bupropion. What are the other medication? I believe I have not prescribed other than these meds.

## 2021-02-25 NOTE — Telephone Encounter (Signed)
received fax to refill the burpropion xl 300mg 

## 2021-02-25 NOTE — Telephone Encounter (Signed)
pt needs refils on all his medications

## 2021-02-25 NOTE — Telephone Encounter (Signed)
received fax that burpion 150mg  needed refills   so both the bupripion 300 and 150 needs to be refilled. And the others ones also

## 2021-03-05 NOTE — Progress Notes (Deleted)
BH MD/PA/NP OP Progress Note  03/05/2021 4:22 PM Ricky [name]  MRN:  [id number]  Chief Complaint:  HPI: *** Visit Diagnosis: No diagnosis found.  Past Psychiatric History: Please see initial evaluation for full details. I have reviewed the history. No updates at this time.     Past Medical History:  Past Medical History:  Diagnosis Date   Anxiety    Carpal tunnel syndrome    Depression    Diabetes (HCC)    Hyperlipidemia    Hypertension     Past Surgical History:  Procedure Laterality Date   CARPAL TUNNEL RELEASE Right 11/28/2020   Procedure: CARPAL TUNNEL RELEASE ENDOSCOPIC;  Surgeon: Christena Flake, MD;  Location: [facility];  Service: Orthopedics;  Laterality: Right;   CARPAL TUNNEL RELEASE Left 01/22/2021   Procedure: CARPAL TUNNEL RELEASE ENDOSCOPIC;  Surgeon: Christena Flake, MD;  Location: [facility];  Service: Orthopedics;  Laterality: Left;   KNEE ARTHROSCOPY Right     Family Psychiatric History: Please see initial evaluation for full details. I have reviewed the history. No updates at this time.     Family History:  Family History  Problem Relation Age of Onset   Depression Sister    Depression Brother     Social History:  Social History   Socioeconomic History   Marital status: Significant Other    Spouse name: Ricky [name]   Number of children: Not on file   Years of education: Not on file   Highest education level: Not on file  Occupational History   Not on file  Tobacco Use   Smoking status: Never   Smokeless tobacco: Never  Vaping Use   Vaping Use: Never used  Substance and Sexual Activity   Alcohol use: Never   Drug use: Never   Sexual activity: Not on file  Other Topics Concern   Not on file  Social History Narrative   Lives with friend   Social Determinants of Health   Financial Resource Strain: Not on file  Food Insecurity: Not on file  Transportation Needs: Not on file  Physical Activity: Not on file  Stress: Not on file  Social  Connections: Not on file    Allergies:  Allergies  Allergen Reactions   Mirtazapine Rash    Metabolic Disorder Labs: No results found for: HGBA1C, MPG No results found for: PROLACTIN No results found for: CHOL, TRIG, HDL, CHOLHDL, VLDL, LDLCALC No results found for: TSH  Therapeutic Level Labs: No results found for: LITHIUM No results found for: VALPROATE No components found for:  CBMZ  Current Medications: Current Outpatient Medications  Medication Sig Dispense Refill   ARIPiprazole (ABILIFY) 30 MG tablet Take 30 mg by mouth daily.     atenolol (TENORMIN) 50 MG tablet Take 50 mg by mouth daily.     buPROPion (WELLBUTRIN XL) 150 MG 24 hr tablet Take 1 tablet (150 mg total) by mouth daily. Total of 450 mg daily. Take along with 300 mg tab 30 tablet 0   buPROPion (WELLBUTRIN XL) 300 MG 24 hr tablet Take 1 tablet (300 mg total) by mouth daily. Total of 450 mg daily. Take along with 150 mg tab 30 tablet 0   carbidopa-levodopa (SINEMET IR) 25-100 MG tablet Take 1 tablet by mouth in the morning, at noon, and at bedtime.     chlorthalidone (HYGROTON) 25 MG tablet Take 25 mg by mouth daily.     clonazePAM (KLONOPIN) 1 MG tablet Take 1 mg by mouth in the morning, at  noon, and at bedtime.     DULoxetine (CYMBALTA) 60 MG capsule Take 60 mg by mouth daily.     enalapril (VASOTEC) 10 MG tablet Take 10 mg by mouth daily.     etodolac (LODINE) 500 MG tablet Take 500 mg by mouth 2 (two) times daily.     ezetimibe (ZETIA) 10 MG tablet Take 10 mg by mouth daily.     gabapentin (NEURONTIN) 300 MG capsule Take 1 capsule (300 mg total) by mouth 2 (two) times daily. 60 capsule 3   ketoconazole (NIZORAL) 2 % shampoo Apply 1 application topically every other day.     metFORMIN (GLUCOPHAGE) 500 MG tablet Take 500 mg by mouth 2 (two) times daily.     sildenafil (VIAGRA) 50 MG tablet Take 50 mg by mouth daily as needed for erectile dysfunction.     simvastatin (ZOCOR) 20 MG tablet Take 20 mg by mouth  daily.     traZODone (DESYREL) 50 MG tablet Take 50 mg by mouth at bedtime.     VICTOZA 18 MG/3ML SOPN Inject 1.2 mg into the skin daily.     No current facility-administered medications for this visit.     Musculoskeletal: Strength & Muscle Tone:  N/A Gait & Station:  N/A Patient leans: N/A  Psychiatric Specialty Exam: Review of Systems  There were no vitals taken for this visit.There is no height or weight on file to calculate BMI.  General Appearance: {Appearance:22683}  Eye Contact:  {BHH EYE CONTACT:22684}  Speech:  Clear and Coherent  Volume:  Normal  Mood:  {BHH MOOD:22306}  Affect:  {Affect (PAA):22687}  Thought Process:  Coherent  Orientation:  Full (Time, Place, and Person)  Thought Content: Logical   Suicidal Thoughts:  {ST/HT (PAA):22692}  Homicidal Thoughts:  {ST/HT (PAA):22692}  Memory:  Immediate;   Good  Judgement:  {Judgement (PAA):22694}  Insight:  {Insight (PAA):22695}  Psychomotor Activity:  Normal  Concentration:  Concentration: Good and Attention Span: Good  Recall:  Good  Fund of Knowledge: Good  Language: Good  Akathisia:  No  Handed:  Right  AIMS (if indicated): not done  Assets:  Communication Skills Desire for Improvement  ADL's:  Intact  Cognition: WNL  Sleep:  {BHH GOOD/FAIR/POOR:22877}   Screenings: Oceanographer Row Office Visit from 10/15/2020 in [facility] Office Visit from 09/24/2020 in [facility]  PHQ-2 Total Score 0 6  PHQ-9 Total Score -- 19      Flowsheet Row Pre-Admission Testing 45 from 01/21/2021 in [facility] Pre-Admission Testing 45 from 11/22/2020 in [facility] Video Visit from 10/29/2020 in [facility]  C-SSRS RISK CATEGORY No Risk No Risk No Risk        Assessment and Plan:  Ricky [name] is a 65 y.o. year old male with a  history of depression, anxiety, diabetes, hypertension, hyperlipidemia, carpal tunnel syndrome, s/p knee arthroscopy, who presents for follow up appointment for below.    1. MDD (major depressive disorder), recurrent episode, moderate (HCC) # Unspecified anxiety disorder Although he continues to report depressive symptoms and anxiety, there has been overall improvement since uptitration of bupropion.  Psychosocial stressors includes demoralization secondary to pain and unemployment.He reports fair relationship with his wife, and would like to go back to work when his mood improves. Will continue duloxetine for depression anxiety. Will continue current dose of bupropion to see if it  exerts its full benefit for depression. Will continue Abilify for depression at this time. Noted that he has cogwheel rigidity on exam; will plan to lower the dose in the near future as his mood improves. Discussed potential metabolic side effect and EPS from Abilify. Noted that he has been on clonazepam, prescribed by his PCP.  He is recommended to be tapered off this medication in the future especially given his history of alcohol use.   2. Insomnia, unspecified type Improving. He complains of hypersomnia, fatigue and occasional snoring.  Although he was recommended for sleep evaluation, he wants to hold this at this time. Will continue Trazodone at this time for insomnia.    # Alcohol use disorder in sustained remission He denies any alcohol use/craving for alcohol.  Will continue motivational interview.   Plan 1. Continue duloxetine 120 mg daily  2. Continue bupropion 450 mg daily 3. Continue Abilify 30 mg daily- monitor weight gain, cogwheel rigidity on left arm 4. Continue clonazepam 1 mg three times a day for anxiety- prescribed by PCP 5. Continue trazodone 50 mg at night 6. Next appointment: 8/26 at 11:30 for 30 mins, video - he sees a therapist   Past trials of medication: duloxetine, venlafaxine, Abilify,  clonazepam,    The patient demonstrates the following risk factors for suicide: Chronic risk factors for suicide include: psychiatric disorder of depression and chronic pain. Acute risk factors for suicide include: unemployment. Protective factors for this patient include: positive social support, coping skills and hope for the future. Considering these factors, the overall suicide risk at this point appears to be low. Patient is appropriate for outpatient follow up.  Neysa Hotter, MD 03/05/2021, 4:22 PM

## 2021-03-10 ENCOUNTER — Ambulatory Visit: Payer: Commercial Managed Care - PPO | Admitting: Psychiatry

## 2021-03-31 NOTE — Progress Notes (Signed)
BH MD/PA/NP OP Progress Note  04/07/2021 11:32 AM Ricky [name]  MRN:  [id number]  Chief Complaint:  Chief Complaint   Follow-up    HPI:  This is a follow-up appointment for depression.  He has not seen since July.  - per chart review,  he was seen by neurologist for hand tremors. "symptoms possibly consistent with Parkinsonian tremor vs drug induced Parkinson's. Less likely essential tremor." He was started on Sinemet by his neurologist.   He states that he does not want to do anything.  He has depressive symptoms as in PHQ-9.  He sits in a chair most of the time.  Although he thought he was doing better on bupropion, he tends to feel depressed lately.  He feels anxious at times.  He denies panic attacks as long as he takes clonazepam.  He denies alcohol use or drug use.   [name], his wife presents to the interview, and helps majority of the history.  He is depressed, and stays in the house most of the time.  [name] states that he was doing very well when he was first started on Abilify. He was diagnosed with Parkinson disease by his neurologist.  He also underwent surgery for carpal tunnel syndrome.  [name] tries to help him so that everything is ready when he needs while she is trying to give him a safe space.  Upon reviewing his medication, he has been taking duloxetine 60 mg instead of 120 mg.   Daily routine: sit in the chair Exercise: none Employment: unemployed since Dec 2021. Quit due to pain, used to work as a Lawyer since age 59 Support: [name]/fiance, older son, his parents/age 66s, who live next door Household: fiance Marital status: married (divorced twice) Number of children: 3 children, age 15-41 Education: 12 th grade He was born and grew up in [place]. He has good relationship with his parents as a child, although he suffers from depression and anxiety "since born."    Wt Readings from Last 3 Encounters:  04/07/21 190 lb 6.4 oz (86.4 kg)  01/22/21 190  lb 0.6 oz (86.2 kg)  01/21/21 190 lb 0.6 oz (86.2 kg)     Daily routine: sit in the chair Exercise: none Employment: unemployed since Dec 2021. Quit due to pain, used to work as a Lawyer since age 71 Support: [name]/fiance, older son, his parents/age 67s, who live next door Household: fiance Marital status: married (divorced twice) Number of children: 3 children, age 28-41 Education: 12 th grade He was born and grew up in [place]. He has good relationship with his parents as a child, although he suffers from depression and anxiety "since born."  Visit Diagnosis:    ICD-10-CM   1. MDD (major depressive disorder), recurrent episode, moderate (HCC)  F33.1     2. Insomnia, unspecified type  G47.00       Past Psychiatric History: Please see initial evaluation for full details. I have reviewed the history. No updates at this time.     Past Medical History:  Past Medical History:  Diagnosis Date   Anxiety    Carpal tunnel syndrome    Depression    Diabetes (HCC)    Hyperlipidemia    Hypertension     Past Surgical History:  Procedure Laterality Date   CARPAL TUNNEL RELEASE Right 11/28/2020   Procedure: CARPAL TUNNEL RELEASE ENDOSCOPIC;  Surgeon: Christena Flake, MD;  Location: [facility];  Service: Orthopedics;  Laterality: Right;  CARPAL TUNNEL RELEASE Left 01/22/2021   Procedure: CARPAL TUNNEL RELEASE ENDOSCOPIC;  Surgeon: Christena Flake, MD;  Location: [facility];  Service: Orthopedics;  Laterality: Left;   KNEE ARTHROSCOPY Right     Family Psychiatric History: Please see initial evaluation for full details. I have reviewed the history. No updates at this time.     Family History:  Family History  Problem Relation Age of Onset   Depression Sister    Depression Brother     Social History:  Social History   Socioeconomic History   Marital status: Significant Other    Spouse name: [name]   Number of children: Not on file   Years of education: Not on  file   Highest education level: Not on file  Occupational History   Not on file  Tobacco Use   Smoking status: Never   Smokeless tobacco: Never  Vaping Use   Vaping Use: Never used  Substance and Sexual Activity   Alcohol use: Never   Drug use: Never   Sexual activity: Not on file  Other Topics Concern   Not on file  Social History Narrative   Lives with friend   Social Determinants of Health   Financial Resource Strain: Not on file  Food Insecurity: Not on file  Transportation Needs: Not on file  Physical Activity: Not on file  Stress: Not on file  Social Connections: Not on file    Allergies:  Allergies  Allergen Reactions   Mirtazapine Rash    Metabolic Disorder Labs: No results found for: HGBA1C, MPG No results found for: PROLACTIN No results found for: CHOL, TRIG, HDL, CHOLHDL, VLDL, LDLCALC No results found for: TSH  Therapeutic Level Labs: No results found for: LITHIUM No results found for: VALPROATE No components found for:  CBMZ  Current Medications: Current Outpatient Medications  Medication Sig Dispense Refill   amoxicillin (AMOXIL) 500 MG capsule Take by mouth.     ARIPiprazole (ABILIFY) 30 MG tablet Take 30 mg by mouth daily.     atenolol (TENORMIN) 50 MG tablet Take 50 mg by mouth daily.     carbidopa-levodopa (SINEMET IR) 25-100 MG tablet Take 1 tablet by mouth in the morning, at noon, and at bedtime.     chlorthalidone (HYGROTON) 25 MG tablet Take 25 mg by mouth daily.     clonazePAM (KLONOPIN) 1 MG tablet Take 1 mg by mouth in the morning, at noon, and at bedtime.     DULoxetine (CYMBALTA) 60 MG capsule Take 60 mg by mouth daily.     enalapril (VASOTEC) 10 MG tablet Take 10 mg by mouth daily.     etodolac (LODINE) 500 MG tablet Take 500 mg by mouth 2 (two) times daily.     ezetimibe (ZETIA) 10 MG tablet Take 10 mg by mouth daily.     gabapentin (NEURONTIN) 100 MG capsule 300 mg at bedtime     ketoconazole (NIZORAL) 2 % shampoo Apply 1  application topically every other day.     metFORMIN (GLUCOPHAGE) 500 MG tablet Take 500 mg by mouth 2 (two) times daily.     metFORMIN (GLUCOPHAGE) 500 MG tablet Take by mouth.     rosuvastatin (CRESTOR) 10 MG tablet Take 1 tablet by mouth daily.     sildenafil (VIAGRA) 50 MG tablet Take 50 mg by mouth daily as needed for erectile dysfunction.     simvastatin (ZOCOR) 20 MG tablet Take 20 mg by mouth daily.     traZODone (DESYREL) 50  MG tablet Take 50 mg by mouth at bedtime.     VICTOZA 18 MG/3ML SOPN Inject 1.2 mg into the skin daily.     buPROPion (WELLBUTRIN XL) 150 MG 24 hr tablet Take 1 tablet (150 mg total) by mouth daily. Total of 450 mg daily. Take along with 300 mg tab 30 tablet 0   buPROPion (WELLBUTRIN XL) 300 MG 24 hr tablet Take 1 tablet (300 mg total) by mouth daily. Total of 450 mg daily. Take along with 150 mg tab 30 tablet 0   No current facility-administered medications for this visit.     Musculoskeletal: Strength & Muscle Tone:  normal Gait & Station: normal Patient leans: N/A  Psychiatric Specialty Exam: Review of Systems  Psychiatric/Behavioral:  Positive for decreased concentration, dysphoric mood and sleep disturbance. Negative for agitation, behavioral problems, confusion, hallucinations, self-injury and suicidal ideas. The patient is nervous/anxious. The patient is not hyperactive.   All other systems reviewed and are negative.  Blood pressure 126/84, pulse 70, temperature 98.1 F (36.7 C), temperature source Temporal, weight 190 lb 6.4 oz (86.4 kg).Body mass index is 28.12 kg/m.  General Appearance: Fairly Groomed  Eye Contact:  Good  Speech:  Clear and Coherent  Volume:  Normal  Mood:  Depressed  Affect:  Appropriate, Congruent, and Restricted  Thought Process:  Coherent  Orientation:  Full (Time, Place, and Person)  Thought Content: Logical   Suicidal Thoughts:  No  Homicidal Thoughts:  No  Memory:  Immediate;   Good  Judgement:  Good  Insight:   Good  Psychomotor Activity:  Normal to slow, significant rigidity on left>right arm. Occasional resting tremors, dysdiadochokinesis+. No oral dyskinesia  Concentration:  Concentration: Good and Attention Span: Good  Recall:  Good  Fund of Knowledge: Good  Language: Good  Akathisia:  No  Handed:  Right  AIMS (if indicated): 4.   Assets:  Communication Skills Desire for Improvement  ADL's:  Intact  Cognition: WNL  Sleep:  Poor   Screenings: PHQ2-9    Flowsheet Row Office Visit from 04/07/2021 in [facility] Office Visit from 10/15/2020 in [facility] Office Visit from 09/24/2020 in [facility]  PHQ-2 Total Score 6 0 6  PHQ-9 Total Score 22 -- 19      Flowsheet Row Office Visit from 04/07/2021 in [facility] Pre-Admission Testing 45 from 01/21/2021 in [facility] Pre-Admission Testing 45 from 11/22/2020 in [facility]  C-SSRS RISK CATEGORY No Risk No Risk No Risk        Assessment and Plan:  Shannan Emanuel is a 65 y.o. year old male with a history of depression, anxiety, diabetes, hypertension, hyperlipidemia, carpal tunnel syndrome, s/p knee arthroscopy , who presents for follow up appointment for below.    1. MDD (major depressive disorder), recurrent episode, moderate (HCC)  There has been slight worsening in depressive symptoms since the last visit. Psychosocial stressors includes demoralization secondary to pain and unemployment.  Upon reviewing his medication , he has been taking duloxetine at lower dose. Will do further up titration of duloxetine to optimize treatment for depression.  Will decrease Abilify to see if it helps to mitigate his parkinsonian symptoms/while monitoring for any worsening in his mood symptoms.  Discussed treatment option of switching to  Lexapro/referral to ECT in the future if he has limited benefit from up titration of duloxetine.  He continues to  be on clonazepam for anxiety, prescribed by his PCP.   # Parkinsonism Exam is notable for significant rigidity on his arms, and this is significant worsening since in person visit in July.  Although Abilify could certainly cause his current symptoms, he was diagnosed with Parkinson disease by neurologist as well.  Will taper down Abilify as described above, and will plan to discontinue this medication in the near future.  Will not try other antipsychotics at this time to avoid any potential side effect.   2. Insomnia, unspecified type Improvement in his insomnia.  We will continue trazodone as needed for insomnia.  Noted that although he reports hypersomnia/fatigue and an occasional snoring, he is not interested in a sleep evaluation.   # Alcohol use disorder in sustained remission Unchanged. He denies any alcohol use/craving for alcohol.  Will continue motivational interview.   Plan Increase duloxetine 90 mg daily Decrease Abilify 15 mg daily  3.  Continue bupropion 450 mg daily 4.  Continue clonazepam 1 mg three times a day for anxiety- prescribed by PCP 5. Continue trazodone 50 mg at night 6. Next appointment: 12/15 at 8 AM for 30 mins, video and 1/30 at 9 Am for in person - he sees a therapist   Past trials of medication: duloxetine, venlafaxine, Abilify, clonazepam,    The patient demonstrates the following risk factors for suicide: Chronic risk factors for suicide include: psychiatric disorder of depression and chronic pain. Acute risk factors for suicide include: unemployment. Protective factors for this patient include: positive social support, coping skills and hope for the future. Considering these factors, the overall suicide risk at this point appears to be low. Patient is appropriate for outpatient follow up.      Neysa Hotter, MD 04/07/2021, 11:32 AM

## 2021-04-07 ENCOUNTER — Other Ambulatory Visit: Payer: Self-pay

## 2021-04-07 ENCOUNTER — Encounter: Payer: Self-pay | Admitting: Psychiatry

## 2021-04-07 ENCOUNTER — Ambulatory Visit (INDEPENDENT_AMBULATORY_CARE_PROVIDER_SITE_OTHER): Payer: Commercial Managed Care - PPO | Admitting: Psychiatry

## 2021-04-07 VITALS — BP 126/84 | HR 70 | Temp 98.1°F | Wt 190.4 lb

## 2021-04-07 DIAGNOSIS — G20 Parkinson's disease: Secondary | ICD-10-CM

## 2021-04-07 DIAGNOSIS — G4700 Insomnia, unspecified: Secondary | ICD-10-CM

## 2021-04-07 DIAGNOSIS — F331 Major depressive disorder, recurrent, moderate: Secondary | ICD-10-CM

## 2021-04-07 MED ORDER — DULOXETINE HCL 30 MG PO CPEP: 90.0000 mg | ORAL_CAPSULE | Freq: Every day | ORAL | 1 refills | Status: DC

## 2021-04-07 NOTE — Patient Instructions (Addendum)
Increase duloxetine 90 mg daily Decrease abilify 15 mg daily  3.  Continue bupropion 450 mg daily 4.  Continue clonazepam 1 mg three times a day for anxiety 5. Continue trazodone 50 mg at night 6. Next appointment: 12/15 at 8 AM, video and 1/30 at 9 Am for in person

## 2021-04-16 ENCOUNTER — Other Ambulatory Visit: Payer: Self-pay | Admitting: Student in an Organized Health Care Education/Training Program

## 2021-04-16 ENCOUNTER — Other Ambulatory Visit: Payer: Self-pay | Admitting: Psychiatry

## 2021-04-17 NOTE — Progress Notes (Signed)
Virtual Visit via Video Note  I connected with Debroah Loop on 04/24/21 at  8:00 AM EST by a video enabled telemedicine application and verified that I am speaking with the correct person using two identifiers.  Location: Patient: home Provider: office Persons participated in the visit- patient, provider    I discussed the limitations of evaluation and management by telemedicine and the availability of in person appointments. The patient expressed understanding and agreed to proceed.    I discussed the assessment and treatment plan with the patient. The patient was provided an opportunity to ask questions and all were answered. The patient agreed with the plan and demonstrated an understanding of the instructions.   The patient was advised to call back or seek an in-person evaluation if the symptoms worsen or if the condition fails to improve as anticipated.  I provided 21 minutes of non-face-to-face time during this encounter.   Neysa Hotter, MD    [facility] MD/PA/NP OP Progress Note  04/24/2021 8:40 AM Debroah Loop  MRN:  [id number]  Chief Complaint:  Chief Complaint   Follow-up; Depression    HPI:  This is a follow-up appointment for depression and insomnia.  He states that he has not noticed any difference since tapering down Abilify.  He feels about the same, feeling depressed.  He continues to stay in the house most of the time, "not doing anything." He sleeps at 10:30 pm and wakes up at lunch time. He talks with his mother almost every day, and enjoys the time. He had a "fine" day on Thanksgiving at his mother's house.  He states that he does not think he has been keeping collection with his family as much as he should , and is willing to work on this .he is also willing to take a walk regularly.  He has good appetite. He denies SI. He feels less anxious, and it has been more manageable lately.  He denies alcohol use or any craving.   [name], his wife presents to the interview.   She states that he tends to be in the bed most of the time.  While discussing the referral for sleep evaluation due to his symptoms, she states that she does not think he has it.  She hears snoring only when he is asleep deeply.  She also states that he used to be seen by a professor at [facility], and this provider never mentioned about sleep issues.  [name] agrees to contact this clinician if they are not interested in sleep evaluation.  [name] thinks he is not anxious as he does not go outside lately.    Daily routine: sit in the chair Exercise: none Employment: unemployed since Dec 2021. Quit due to pain, used to work as a Lawyer since age 24 Support: [name]/fiance, older son, his parents/age 16s, who live next door Household: fiance Marital status: married (divorced twice) Number of children: 3 children, age 86-41 Education: 12 th grade  Visit Diagnosis:    ICD-10-CM   1. MDD (major depressive disorder), recurrent episode, moderate (HCC)  F33.1     2. Insomnia, unspecified type  G47.00     3. Parkinsonism, unspecified Parkinsonism type (HCC)  G20       Past Psychiatric History: Please see initial evaluation for full details. I have reviewed the history. No updates at this time.     Past Medical History:  Past Medical History:  Diagnosis Date   Anxiety    Carpal tunnel syndrome  Depression    Diabetes (HCC)    Hyperlipidemia    Hypertension     Past Surgical History:  Procedure Laterality Date   CARPAL TUNNEL RELEASE Right 11/28/2020   Procedure: CARPAL TUNNEL RELEASE ENDOSCOPIC;  Surgeon: Christena Flake, MD;  Location: [facility] ORS;  Service: Orthopedics;  Laterality: Right;   CARPAL TUNNEL RELEASE Left 01/22/2021   Procedure: CARPAL TUNNEL RELEASE ENDOSCOPIC;  Surgeon: Christena Flake, MD;  Location: [facility] ORS;  Service: Orthopedics;  Laterality: Left;   KNEE ARTHROSCOPY Right     Family Psychiatric History: Please see initial evaluation for full details. I have  reviewed the history. No updates at this time.     Family History:  Family History  Problem Relation Age of Onset   Depression Sister    Depression Brother     Social History:  Social History   Socioeconomic History   Marital status: Significant Other    Spouse name: [name]   Number of children: Not on file   Years of education: Not on file   Highest education level: Not on file  Occupational History   Not on file  Tobacco Use   Smoking status: Never   Smokeless tobacco: Never  Vaping Use   Vaping Use: Never used  Substance and Sexual Activity   Alcohol use: Never   Drug use: Never   Sexual activity: Not on file  Other Topics Concern   Not on file  Social History Narrative   Lives with friend   Social Determinants of Health   Financial Resource Strain: Not on file  Food Insecurity: Not on file  Transportation Needs: Not on file  Physical Activity: Not on file  Stress: Not on file  Social Connections: Not on file    Allergies:  Allergies  Allergen Reactions   Mirtazapine Rash    Metabolic Disorder Labs: No results found for: HGBA1C, MPG No results found for: PROLACTIN No results found for: CHOL, TRIG, HDL, CHOLHDL, VLDL, LDLCALC No results found for: TSH  Therapeutic Level Labs: No results found for: LITHIUM No results found for: VALPROATE No components found for:  CBMZ  Current Medications: Current Outpatient Medications  Medication Sig Dispense Refill   ARIPiprazole (ABILIFY) 10 MG tablet Take 1 tablet (10 mg total) by mouth daily. 30 tablet 1   amoxicillin (AMOXIL) 500 MG capsule Take by mouth.     atenolol (TENORMIN) 50 MG tablet Take 50 mg by mouth daily.     buPROPion (WELLBUTRIN XL) 150 MG 24 hr tablet Take 1 tablet (150 mg total) by mouth daily. Total of 450 mg daily. Take along with 300 mg tab 30 tablet 0   buPROPion (WELLBUTRIN XL) 300 MG 24 hr tablet Take 1 tablet (300 mg total) by mouth daily. Total of 450 mg daily. Take along with 150 mg  tab 30 tablet 0   carbidopa-levodopa (SINEMET IR) 25-100 MG tablet Take 1 tablet by mouth 5 (five) times daily.     chlorthalidone (HYGROTON) 25 MG tablet Take 25 mg by mouth daily.     clonazePAM (KLONOPIN) 1 MG tablet Take 1 mg by mouth in the morning, at noon, and at bedtime.     DULoxetine (CYMBALTA) 30 MG capsule Take 3 capsules (90 mg total) by mouth daily. 90 capsule 1   enalapril (VASOTEC) 10 MG tablet Take 10 mg by mouth daily.     etodolac (LODINE) 500 MG tablet Take 500 mg by mouth 2 (two) times daily.  ezetimibe (ZETIA) 10 MG tablet Take 10 mg by mouth daily.     gabapentin (NEURONTIN) 100 MG capsule 300 mg at bedtime     ketoconazole (NIZORAL) 2 % shampoo Apply 1 application topically every other day.     metFORMIN (GLUCOPHAGE) 500 MG tablet Take 500 mg by mouth 2 (two) times daily.     metFORMIN (GLUCOPHAGE) 500 MG tablet Take by mouth.     rosuvastatin (CRESTOR) 10 MG tablet Take 1 tablet by mouth daily.     sildenafil (VIAGRA) 50 MG tablet Take 50 mg by mouth daily as needed for erectile dysfunction.     simvastatin (ZOCOR) 20 MG tablet Take 20 mg by mouth daily.     traZODone (DESYREL) 50 MG tablet Take 50 mg by mouth at bedtime.     VICTOZA 18 MG/3ML SOPN Inject 1.2 mg into the skin daily.     No current facility-administered medications for this visit.     Musculoskeletal: Strength & Muscle Tone:  N/A Gait & Station:  N/A Patient leans: N/A  Psychiatric Specialty Exam: Review of Systems  Psychiatric/Behavioral:  Positive for decreased concentration, dysphoric mood and sleep disturbance. Negative for agitation, behavioral problems, confusion, hallucinations, self-injury and suicidal ideas. The patient is nervous/anxious. The patient is not hyperactive.   All other systems reviewed and are negative.  There were no vitals taken for this visit.There is no height or weight on file to calculate BMI.  General Appearance: Fairly Groomed  Eye Contact:  Good  Speech:   Clear and Coherent  Volume:  Normal  Mood:   "same"  Affect:  Appropriate, Congruent, and Restricted  Thought Process:  Coherent  Orientation:  Full (Time, Place, and Person)  Thought Content: Logical   Suicidal Thoughts:  No  Homicidal Thoughts:  No  Memory:  Immediate;   Good  Judgement:  Good  Insight:  Good  Psychomotor Activity:  Normal  Concentration:  Concentration: Good and Attention Span: Good  Recall:  Good  Fund of Knowledge: Good  Language: Good  Akathisia:  No  Handed:  Right  AIMS (if indicated): not done  Assets:  Communication Skills Desire for Improvement  ADL's:  Intact  Cognition: WNL  Sleep:  Poor   Screenings: PHQ2-9    Flowsheet Row Office Visit from 04/07/2021 in [facility] Office Visit from 10/15/2020 in [facility] Office Visit from 09/24/2020 in [facility]  PHQ-2 Total Score 6 0 6  PHQ-9 Total Score 22 -- 19      Flowsheet Row Office Visit from 04/07/2021 in [facility] Pre-Admission Testing 45 from 01/21/2021 in [facility] Pre-Admission Testing 45 from 11/22/2020 in [facility]  C-SSRS RISK CATEGORY No Risk No Risk No Risk        Assessment and Plan:  Kiran Carline is a 65 y.o. year old male with a history of depression, anxiety, diabetes, hypertension, hyperlipidemia, carpal tunnel syndrome, s/p knee arthroscopy, who presents for follow up appointment for below.    1. MDD (major depressive disorder), recurrent episode, moderate (HCC) Although he continues to report depressive symptoms, he denies any significant change since lowering the medication due to concern of parkinsonian symptoms. Psychosocial stressors includes demoralization secondary to pain and unemployment.  Will do further tapering down to avoid its potential impact on  his Parkinson symptoms.  Will continue current dose of duloxetine at this time  to see if it exerts its full benefit given it has been recently uptitrated.  Previously discussed treatment option of switching to Lexapro/referral to ECT in the future if he has limited benefit from current medication regimen.  Noted that he has been on clonazepam for anxiety, prescribed by his PCP.   2. Insomnia, unspecified type He has profound fatigue, hypersomnia.  Although he was reportedly having snoring when he sleeps deeply, his wife is not interested in having sleep evaluation with the thought that he does not have this condition.  They are informed to notify this clinician if interested in making a referral.   3. Parkinsonism, unspecified Parkinsonism type (HCC) He was diagnosed with Parkinson disease by his neurologist.  Will try to taper off Abilify as described above due to its potential side effect.    # Alcohol use disorder in sustained remission Unchanged. He denies any alcohol use/craving for alcohol.  Will continue motivational interview.   Plan Continue duloxetine 90 mg daily (filled on 12/1) Decrease Abilify 10 mg daily  3.  Continue bupropion 450 mg daily 4.  Continue clonazepam 1 mg three times a day for anxiety- prescribed by PCP 5. Continue trazodone 50 mg at night 6. Next appointment: 12/15 at 8 AM for 30 mins, video and 1/30 at 9 Am for in person - he sees a therapist  Addendum: contacted the pharmacy. Bupropion 150/300 mg was filled on 10/18 for 30 days With the information above, this clinician contacted the patient again.  They stated that they have been taking the bupropion regularly.  There is a bottle of medication, which was prescribed in September for a month.  Both agreed to continue taking the current dose of bupropion regularly.    Past trials of medication: duloxetine, venlafaxine, Abilify, clonazepam,    The patient demonstrates the following risk factors for suicide:  Chronic risk factors for suicide include: psychiatric disorder of depression and chronic pain. Acute risk factors for suicide include: unemployment. Protective factors for this patient include: positive social support, coping skills and hope for the future. Considering these factors, the overall suicide risk at this point appears to be low. Patient is appropriate for outpatient follow up.  Neysa Hotter, MD 04/24/2021, 8:40 AM

## 2021-04-24 ENCOUNTER — Encounter: Payer: Self-pay | Admitting: Psychiatry

## 2021-04-24 ENCOUNTER — Other Ambulatory Visit: Payer: Self-pay

## 2021-04-24 ENCOUNTER — Telehealth (INDEPENDENT_AMBULATORY_CARE_PROVIDER_SITE_OTHER): Payer: Self-pay | Admitting: Psychiatry

## 2021-04-24 DIAGNOSIS — G20 Parkinson's disease: Secondary | ICD-10-CM

## 2021-04-24 DIAGNOSIS — F331 Major depressive disorder, recurrent, moderate: Secondary | ICD-10-CM

## 2021-04-24 DIAGNOSIS — G4700 Insomnia, unspecified: Secondary | ICD-10-CM

## 2021-04-24 MED ORDER — BUPROPION HCL ER (XL) 150 MG PO TB24: 150.0000 mg | ORAL_TABLET | Freq: Every day | ORAL | 3 refills | Status: DC

## 2021-04-24 MED ORDER — ARIPIPRAZOLE 10 MG PO TABS: 10.0000 mg | ORAL_TABLET | Freq: Every day | ORAL | 1 refills | Status: DC

## 2021-04-24 MED ORDER — BUPROPION HCL ER (XL) 300 MG PO TB24: 300.0000 mg | ORAL_TABLET | Freq: Every day | ORAL | 3 refills | Status: DC

## 2021-04-24 NOTE — Patient Instructions (Signed)
Continue duloxetine 90 mg daily  Decrease Abilify 10 mg daily  3.  Continue bupropion 450 mg daily 4.  Continue clonazepam 1 mg three times a day for anxiety 5. Continue trazodone 50 mg at night 6. Next appointment: 1/30 at 9 Am for in person  The next visit will be in person visit. Please arrive 15 mins before the scheduled time.   [facility]  Address: [address], [place], [place] [address]

## 2021-04-29 ENCOUNTER — Encounter: Payer: Disability Insurance | Admitting: Student in an Organized Health Care Education/Training Program

## 2021-05-02 ENCOUNTER — Other Ambulatory Visit: Payer: Self-pay | Admitting: Psychiatry

## 2021-05-19 ENCOUNTER — Other Ambulatory Visit: Payer: Self-pay | Admitting: Psychiatry

## 2021-05-27 ENCOUNTER — Other Ambulatory Visit: Payer: Self-pay | Admitting: Psychiatry

## 2021-06-04 NOTE — Progress Notes (Signed)
[facility] MD/PA/NP OP Progress Note  06/09/2021 10:27 AM Ricky [name]  MRN:  [id number]  Chief Complaint:  Chief Complaint   Follow-up    HPI:  This is a follow-up appointment for depression.  The majority of the history is obtained by his wife, who presents to the interview.  He states that there has been no change since the last visit.  He tends to stay in the house, watching TV.  He does not have any motivation to do anything.  He had a good holiday with his family.  He has not noticed any difference since lowering the dose of Abilify.  He denies any change in his Parkinson symptoms.  His wife is concerned about him as he does not do anything.  She states that he does not have any life.  He had extraction of his 9 tooth, and will have a denture.  He has fair sleep.  He has good appetite.  He has difficulty in concentration when he is not interested.  He denies SI.  He denies any hallucinations.  He and his wife does not recognize any memory issues.  Although he tends to feel anxious when he tries to go outside, he does not think it is related to his motivation.  He denies panic attacks.  His wife is willing to pursue ECT referral.   Daily routine: sit in the chair, watching TV Exercise: none Employment: unemployed since Dec 2021. Quit due to pain, used to work as a Lawyerbody shop technician since age 66 Support: [name]/fiance, older son, his parents/age 9680s, who live next door Household: fiance Marital status: married (divorced twice) Number of children: 3 children, age 64-41 Education: 12 th grade   Wt Readings from Last 3 Encounters:  06/09/21 189 lb 3.2 oz (85.8 kg)  04/07/21 190 lb 6.4 oz (86.4 kg)  01/22/21 190 lb 0.6 oz (86.2 kg)     Visit Diagnosis:    ICD-10-CM   1. MDD (major depressive disorder), recurrent episode, moderate (HCC)  F33.1     2. Insomnia, unspecified type  G47.00       Past Psychiatric History: Please see initial evaluation for full details. I have reviewed the  history. No updates at this time.     Past Medical History:  Past Medical History:  Diagnosis Date   Anxiety    Carpal tunnel syndrome    Depression    Diabetes (HCC)    Hyperlipidemia    Hypertension     Past Surgical History:  Procedure Laterality Date   CARPAL TUNNEL RELEASE Right 11/28/2020   Procedure: CARPAL TUNNEL RELEASE ENDOSCOPIC;  Surgeon: Christena FlakePoggi, John J, MD;  Location: [facility];  Service: Orthopedics;  Laterality: Right;   CARPAL TUNNEL RELEASE Left 01/22/2021   Procedure: CARPAL TUNNEL RELEASE ENDOSCOPIC;  Surgeon: Christena FlakePoggi, John J, MD;  Location: [facility];  Service: Orthopedics;  Laterality: Left;   KNEE ARTHROSCOPY Right     Family Psychiatric History: Please see initial evaluation for full details. I have reviewed the history. No updates at this time.     Family History:  Family History  Problem Relation Age of Onset   Depression Sister    Depression Brother     Social History:  Social History   Socioeconomic History   Marital status: Significant Other    Spouse name: [name]   Number of children: Not on file   Years of education: Not on file   Highest education level: Not on file  Occupational History  Not on file  Tobacco Use   Smoking status: Never   Smokeless tobacco: Never  Vaping Use   Vaping Use: Never used  Substance and Sexual Activity   Alcohol use: Never   Drug use: Never   Sexual activity: Not on file  Other Topics Concern   Not on file  Social History Narrative   Lives with friend   Social Determinants of Health   Financial Resource Strain: Not on file  Food Insecurity: Not on file  Transportation Needs: Not on file  Physical Activity: Not on file  Stress: Not on file  Social Connections: Not on file    Allergies:  Allergies  Allergen Reactions   Mirtazapine Rash    Metabolic Disorder Labs: No results found for: HGBA1C, MPG No results found for: PROLACTIN No results found for: CHOL, TRIG, HDL, CHOLHDL, VLDL,  LDLCALC No results found for: TSH  Therapeutic Level Labs: No results found for: LITHIUM No results found for: VALPROATE No components found for:  CBMZ  Current Medications: Current Outpatient Medications  Medication Sig Dispense Refill   amoxicillin (AMOXIL) 500 MG capsule Take by mouth.     atenolol (TENORMIN) 50 MG tablet Take 50 mg by mouth daily.     buPROPion (WELLBUTRIN XL) 150 MG 24 hr tablet Take 1 tablet (150 mg total) by mouth daily. Total of 450 mg daily. Take along with 300 mg tab 90 tablet 0   buPROPion (WELLBUTRIN XL) 300 MG 24 hr tablet Take 1 tablet (300 mg total) by mouth daily. Total of 450 mg daily. Take along with 150 mg tab 90 tablet 0   carbidopa-levodopa (SINEMET IR) 25-100 MG tablet Take 1 tablet by mouth 5 (five) times daily.     chlorthalidone (HYGROTON) 25 MG tablet Take 25 mg by mouth daily.     clonazePAM (KLONOPIN) 1 MG tablet Take 1 mg by mouth in the morning, at noon, and at bedtime.     DULoxetine (CYMBALTA) 30 MG capsule Take 3 capsules (90 mg total) by mouth daily. 270 capsule 0   enalapril (VASOTEC) 10 MG tablet Take 10 mg by mouth daily.     etodolac (LODINE) 500 MG tablet Take 500 mg by mouth 2 (two) times daily.     ezetimibe (ZETIA) 10 MG tablet Take 10 mg by mouth daily.     gabapentin (NEURONTIN) 100 MG capsule 300 mg at bedtime     ketoconazole (NIZORAL) 2 % shampoo Apply 1 application topically every other day.     metFORMIN (GLUCOPHAGE) 500 MG tablet Take 500 mg by mouth 2 (two) times daily.     metFORMIN (GLUCOPHAGE) 500 MG tablet Take by mouth.     rosuvastatin (CRESTOR) 10 MG tablet Take 1 tablet by mouth daily.     sildenafil (VIAGRA) 50 MG tablet Take 50 mg by mouth daily as needed for erectile dysfunction.     simvastatin (ZOCOR) 20 MG tablet Take 20 mg by mouth daily.     traZODone (DESYREL) 50 MG tablet Take 50 mg by mouth at bedtime.     VICTOZA 18 MG/3ML SOPN Inject 1.2 mg into the skin daily.     No current facility-administered  medications for this visit.     Musculoskeletal: Strength & Muscle Tone:  N/A Gait & Station:  N/A Patient leans: N/A  Psychiatric Specialty Exam: Review of Systems  Psychiatric/Behavioral:  Positive for decreased concentration and dysphoric mood. Negative for agitation, behavioral problems, confusion, hallucinations, self-injury, sleep disturbance and suicidal ideas.  The patient is nervous/anxious. The patient is not hyperactive.   All other systems reviewed and are negative.  Blood pressure 124/80, pulse 85, temperature 98.3 F (36.8 C), temperature source Temporal, weight 189 lb 3.2 oz (85.8 kg).Body mass index is 27.94 kg/m.  General Appearance: Fairly Groomed  Eye Contact:  Good  Speech:  Clear and Coherent  Volume:  Normal  Mood:  Depressed  Affect:  Appropriate, Congruent, and Restricted  Thought Process:  Coherent  Orientation:  Full (Time, Place, and Person)  Thought Content: Logical   Suicidal Thoughts:  No  Homicidal Thoughts:  No  Memory:  Immediate;   Good  Judgement:  Good  Insight:  Good  Psychomotor Activity:  Normal cogwheel rigidity on bilateral arms, no resting tremors  Concentration:  Concentration: Good and Attention Span: Good  Recall:  Good  Fund of Knowledge: Good  Language: Good  Akathisia:  No  Handed:  Right  AIMS (if indicated): not done  Assets:  Communication Skills Desire for Improvement  ADL's:  Intact  Cognition: WNL  Sleep:  Fair   Screenings: PHQ2-9    Flowsheet Row Office Visit from 04/07/2021 in [facility] Office Visit from 10/15/2020 in [facility] Office Visit from 09[facility]24/2020 in [facility]  PHQ-2 Total Score 6 0 6  PHQ-9 Total Score 22 -- 19      Flowsheet Row Office Visit from 04/07/2021 in [facility] Pre-Admission Testing 45 from 01/21/2021 in [facility] Pre-Admission Testing 45 from 11/22/2020 in [facility]  C-SSRS RISK CATEGORY No Risk No Risk No Risk        Assessment and Plan:  Ricky [name] is a 66 y.o. year old male with a history of depression, anxiety, diabetes, hypertension, hyperlipidemia, carpal tunnel syndrome, s/p knee arthroscopy, who presents for follow up appointment for below.    1. MDD (major depressive disorder), recurrent episode, moderate (HCC) He continues to report depressive symptoms since the last visit.  Psychosocial stressors includes demoralization secondary to pain and unemployment.  Will switch from Abilify to quetiapine to see if it would be more beneficial for depression, anxiety.  Discussed potential risk of worsening in EPS and metabolic side effect.  Will continue current dose of duloxetine at this time for depression.  Will consider switching to other antidepressant in the future if he has limited benefit from quetiapine.  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  Will make referral.  Noted that although his depressive symptoms are more prominent with apathy, both the patient and his wife denies any apparent cognitive issues to be concerned of neuropsychiatric symptoms related to cognitive impairment. Will continue to monitor.   2. Insomnia, unspecified type He has profound fatigue, hypersomnia.  Although he was reportedly having snoring when he sleeps deeply, his wife is not interested in having sleep evaluation with the thought that he does not have this condition.  They are informed to notify this clinician if interested in making a referral.    # Alcohol use disorder in sustained remission Unchanged. He denies any alcohol use/craving for alcohol.  Will continue motivational interview.   Plan Continue duloxetine 90 mg daily  (filled on 12/1) Decrease Abilify 5 mg daily for one week, then discontinue Start quetiapine 25 mg at night  Continue bupropion 450 mg daily Continue trazodone 50 mg at night  Continue clonazepam 1 mg three times a day for anxiety- prescribed by PCP Next appointment- 2/28 at 4 PM for 30 mins, video, and 3/27 at 10 AM for 30 mins, in person Referral to ECT - he sees a therapist   Addendum: contacted the pharmacy. Bupropion 150/300 mg was filled on 10/18 for 30 days With the information above, this clinician contacted the patient again.  They stated that they have been taking the bupropion regularly.  There is a bottle of medication, which was prescribed in September for a month.  Both agreed to continue taking the current dose of bupropion regularly.    Past trials of medication: duloxetine, venlafaxine, Abilify, clonazepam,    The patient demonstrates the following risk factors for suicide: Chronic risk factors for suicide include: psychiatric disorder of depression and chronic pain. Acute risk factors for suicide include: unemployment. Protective factors for this patient include: positive social support, coping skills and hope for the future. Considering these factors, the overall suicide risk at this point appears to be low. Patient is appropriate for outpatient follow up.  Neysa Hotter, MD 06/09/2021, 10:27 AM

## 2021-06-09 ENCOUNTER — Encounter: Payer: Self-pay | Admitting: Psychiatry

## 2021-06-09 ENCOUNTER — Other Ambulatory Visit: Payer: Self-pay

## 2021-06-09 ENCOUNTER — Ambulatory Visit (INDEPENDENT_AMBULATORY_CARE_PROVIDER_SITE_OTHER): Payer: 59 | Admitting: Psychiatry

## 2021-06-09 VITALS — BP 124/80 | HR 85 | Temp 98.3°F | Wt 189.2 lb

## 2021-06-09 DIAGNOSIS — G4700 Insomnia, unspecified: Secondary | ICD-10-CM | POA: Diagnosis not present

## 2021-06-09 DIAGNOSIS — F331 Major depressive disorder, recurrent, moderate: Secondary | ICD-10-CM | POA: Diagnosis not present

## 2021-06-09 NOTE — Patient Instructions (Signed)
Continue duloxetine 90 mg daily  Decrease Abilify 5 mg daily for one week, then discontinue Start quetiapine 25 mg at night  Continue bupropion 450 mg daily Continue trazodone 50 mg at night  Continue clonazepam 1 mg three times a day for anxiety- prescribed by PCP Next appointment- 2/28 at 4 PM, video, and 3/27 at 10 AM, in person Referral to ECT

## 2021-06-11 ENCOUNTER — Telehealth: Payer: Self-pay

## 2021-06-11 ENCOUNTER — Other Ambulatory Visit: Payer: Self-pay | Admitting: Psychiatry

## 2021-06-11 MED ORDER — QUETIAPINE FUMARATE 25 MG PO TABS: 25.0000 mg | ORAL_TABLET | Freq: Every day | ORAL | 0 refills | Status: DC

## 2021-06-11 NOTE — Telephone Encounter (Signed)
I apologize. Ordered quetiapine.

## 2021-06-11 NOTE — Telephone Encounter (Signed)
pt wife called states that the new medication he was suppose to start was not sent to the pharmacy. pharmacy did not receive.

## 2021-07-03 ENCOUNTER — Other Ambulatory Visit: Payer: Self-pay | Admitting: Psychiatry

## 2021-07-07 NOTE — Progress Notes (Signed)
Virtual Visit via Video Note  I connected with Ricky [name] on 07/08/21 at  4:00 PM EST by a video enabled telemedicine application and verified that I am speaking with the correct person using two identifiers.  Location: Patient: home Provider: office Persons participated in the visit- patient, provider    I discussed the limitations of evaluation and management by telemedicine and the availability of in person appointments. The patient expressed understanding and agreed to proceed.   I discussed the assessment and treatment plan with the patient. The patient was provided an opportunity to ask questions and all were answered. The patient agreed with the plan and demonstrated an understanding of the instructions.   The patient was advised to call back or seek an in-person evaluation if the symptoms worsen or if the condition fails to improve as anticipated.  I provided 16 minutes of non-face-to-face time during this encounter.   Neysa Hotter, MD   [facility] MD/PA/NP OP Progress Note  07/08/2021 4:34 PM Ricky [name]  MRN:  [id number]  Chief Complaint:  Chief Complaint  Patient presents with   Follow-up   Depression   HPI:  This is a follow-up appointment for depression.  The majority of the history was obtained with the help of his wife.  He states that he feels about the same.  He has not been able to start quetiapine as he could not get medication at the pharmacy.  He tends to stay in the bed most of the time.  He stopped taking Abilify as instructed.  He feels "no worse, no better" since then.  There is slight improvement in tremor since discontinuation of Abilify, although this was a subtle change.  Noted that he has temporary denture, and he is trying to get used to talking while having this denture.  He feels tired and depressed.  Although he may enjoy watching TV, he feels tired of doing this as well.  His wife brings him out for his appointments.  He denies change in appetite or  weight.  He denies SI.  He denies anxiety or hallucinations.  He is willing to try quetiapine at this time.    Daily routine: sit in the chair, watching TV Exercise: none Employment: unemployed since Dec 2021. Quit due to pain, used to work as a Lawyer since age 32 Support: [name]/fiance, older son, his parents/age 38s, who live next door Household: fiance Marital status: married (divorced twice) Number of children: 3 children, age 4-41 Education: 12 th grade    Visit Diagnosis:    ICD-10-CM   1. MDD (major depressive disorder), recurrent episode, moderate (HCC)  F33.1     2. Insomnia, unspecified type  G47.00       Past Psychiatric History: Please see initial evaluation for full details. I have reviewed the history. No updates at this time.     Past Medical History:  Past Medical History:  Diagnosis Date   Anxiety    Carpal tunnel syndrome    Depression    Diabetes (HCC)    Hyperlipidemia    Hypertension     Past Surgical History:  Procedure Laterality Date   CARPAL TUNNEL RELEASE Right 11/28/2020   Procedure: CARPAL TUNNEL RELEASE ENDOSCOPIC;  Surgeon: Christena Flake, MD;  Location: [facility] ORS;  Service: Orthopedics;  Laterality: Right;   CARPAL TUNNEL RELEASE Left 01/22/2021   Procedure: CARPAL TUNNEL RELEASE ENDOSCOPIC;  Surgeon: Christena Flake, MD;  Location: [facility] ORS;  Service: Orthopedics;  Laterality: Left;  KNEE ARTHROSCOPY Right     Family Psychiatric History: Please see initial evaluation for full details. I have reviewed the history. No updates at this time.     Family History:  Family History  Problem Relation Age of Onset   Depression Sister    Depression Brother     Social History:  Social History   Socioeconomic History   Marital status: Significant Other    Spouse name: [name]   Number of children: Not on file   Years of education: Not on file   Highest education level: Not on file  Occupational History   Not on file  Tobacco  Use   Smoking status: Never   Smokeless tobacco: Never  Vaping Use   Vaping Use: Never used  Substance and Sexual Activity   Alcohol use: Never   Drug use: Never   Sexual activity: Not on file  Other Topics Concern   Not on file  Social History Narrative   Lives with friend   Social Determinants of Health   Financial Resource Strain: Not on file  Food Insecurity: Not on file  Transportation Needs: Not on file  Physical Activity: Not on file  Stress: Not on file  Social Connections: Not on file    Allergies:  Allergies  Allergen Reactions   Mirtazapine Rash    Metabolic Disorder Labs: No results found for: HGBA1C, MPG No results found for: PROLACTIN No results found for: CHOL, TRIG, HDL, CHOLHDL, VLDL, LDLCALC No results found for: TSH  Therapeutic Level Labs: No results found for: LITHIUM No results found for: VALPROATE No components found for:  CBMZ  Current Medications: Current Outpatient Medications  Medication Sig Dispense Refill   QUEtiapine (SEROQUEL) 25 MG tablet Take 1 tablet (25 mg total) by mouth at bedtime. 30 tablet 0   amoxicillin (AMOXIL) 500 MG capsule Take by mouth.     atenolol (TENORMIN) 50 MG tablet Take 50 mg by mouth daily.     buPROPion (WELLBUTRIN XL) 150 MG 24 hr tablet Take 1 tablet (150 mg total) by mouth daily. Total of 450 mg daily. Take along with 300 mg tab 90 tablet 0   buPROPion (WELLBUTRIN XL) 300 MG 24 hr tablet Take 1 tablet (300 mg total) by mouth daily. Total of 450 mg daily. Take along with 150 mg tab 90 tablet 0   carbidopa-levodopa (SINEMET IR) 25-100 MG tablet Take 1 tablet by mouth 5 (five) times daily.     chlorthalidone (HYGROTON) 25 MG tablet Take 25 mg by mouth daily.     clonazePAM (KLONOPIN) 1 MG tablet Take 1 mg by mouth in the morning, at noon, and at bedtime.     DULoxetine (CYMBALTA) 30 MG capsule Take 3 capsules (90 mg total) by mouth daily. 270 capsule 0   enalapril (VASOTEC) 10 MG tablet Take 10 mg by mouth  daily.     etodolac (LODINE) 500 MG tablet Take 500 mg by mouth 2 (two) times daily.     ezetimibe (ZETIA) 10 MG tablet Take 10 mg by mouth daily.     gabapentin (NEURONTIN) 100 MG capsule 300 mg at bedtime     ketoconazole (NIZORAL) 2 % shampoo Apply 1 application topically every other day.     metFORMIN (GLUCOPHAGE) 500 MG tablet Take 500 mg by mouth 2 (two) times daily.     metFORMIN (GLUCOPHAGE) 500 MG tablet Take by mouth.     QUEtiapine (SEROQUEL) 25 MG tablet Take 1 tablet (25 mg total) by  mouth at bedtime. 30 tablet 0   rosuvastatin (CRESTOR) 10 MG tablet Take 1 tablet by mouth daily.     sildenafil (VIAGRA) 50 MG tablet Take 50 mg by mouth daily as needed for erectile dysfunction.     simvastatin (ZOCOR) 20 MG tablet Take 20 mg by mouth daily.     traZODone (DESYREL) 50 MG tablet Take 50 mg by mouth at bedtime.     VICTOZA 18 MG/3ML SOPN Inject 1.2 mg into the skin daily.     No current facility-administered medications for this visit.     Musculoskeletal: Strength & Muscle Tone:  N/A Gait & Station:  N/A Patient leans: N/A  Psychiatric Specialty Exam: Review of Systems  Psychiatric/Behavioral:  Positive for decreased concentration, dysphoric mood and sleep disturbance. Negative for agitation, behavioral problems, confusion, hallucinations, self-injury and suicidal ideas. The patient is nervous/anxious. The patient is not hyperactive.   All other systems reviewed and are negative.  There were no vitals taken for this visit.There is no height or weight on file to calculate BMI.  General Appearance: Fairly Groomed  Eye Contact:  Good  Speech:  Clear and Coherent  Volume:  Normal  Mood:  Depressed  Affect:  Appropriate, Congruent, and Restricted  Thought Process:  Coherent  Orientation:  Full (Time, Place, and Person)  Thought Content: Logical   Suicidal Thoughts:  No  Homicidal Thoughts:  No  Memory:  Immediate;   Good  Judgement:  Good  Insight:  Fair  Psychomotor  Activity:  Normal  Concentration:  Concentration: Good and Attention Span: Good  Recall:  Good  Fund of Knowledge: Good  Language: Good  Akathisia:  No  Handed:  Right  AIMS (if indicated): not done  Assets:  Communication Skills Desire for Improvement  ADL's:  Intact  Cognition: WNL  Sleep:  Fair   Screenings: PHQ2-9    Flowsheet Row Office Visit from 04/07/20[facility] Office Visit from 09/24/2020 in [facility]  PHQ-2 Total Score 6 0 6  PHQ-9 Total Score 22 -- 19      Flowsheet Row Office Visit from 04/07/2021 in [facility] Pre-Admission Testing 45 from 01/21/2021 in [facility]MISS[facility] TEST[facility] Pre-Admission Testing 45 from [facility]22/2020 [facility]  C-SSRS RISK CATEGORY No Risk No Risk No Risk        Assessment and Plan:  Ricky LoopJames [name] is a 66 y.o. year old male with a history of depression, anxiety, parkinsonism on levodopa, diabetes, hypertension, hyperlipidemia, carpal tunnel syndrome, s/p knee arthroscopy , who presents for follow up appointment for below.   1. MDD (major depressive disorder), recurrent episode, moderate (HCC) He continues to report depressive symptoms since the last visit. Psychosocial stressors includes demoralization secondary to pain and unemployment.  He was unable to receive and quetiapine, and has not been able to start this medication as discussed.  He is willing to try this medication as adjunctive treatment for depression.  Discussed potential metabolic side effect, and the worsening in and EPS.  Will continue current dose of duloxetine to target depression.  Will consider switching this to other antidepressant if he has limited benefit from quetiapine.  Referral for ECT was done given patient/his wife's  strongly interest.  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  Noted that although his depressive symptoms are more  prominent with apathy, both the patient and his wife denies any apparent cognitive issues to be concerned of neuropsychiatric symptoms related to cognitive impairment. Will continue to monitor.   2. Insomnia, unspecified type Unchanged. He reports profound fatigue and hypersomnia. Although he was reportedly having snoring when he sleeps deeply, his wife is not interested in having sleep evaluation with the thought that he does not have this condition.  They are informed to notify this clinician if interested in making a referral.    # Alcohol use disorder in sustained remission Unchanged. He denies any alcohol use/craving for alcohol.  Will continue motivational interview.   Plan Continue duloxetine 90 mg daily (filled on 12/1) Start quetiapine 25 mg at night  Continue bupropion 450 mg daily Continue trazodone 50 mg at night  Continue clonazepam 1 mg three times a day for anxiety- prescribed by PCP Next appointment- 3/27 at 10 AM for 30 mins, in person Referred to ECT - he sees a therapist   Addendum: contacted the pharmacy. Bupropion 150/300 mg was filled on 10/18 for 30 days With the information above, this clinician contacted the patient again.  They stated that they have been taking the bupropion regularly.  There is a bottle of medication, which was prescribed in September for a month.  Both agreed to continue taking the current dose of bupropion regularly.    Past trials of medication: duloxetine, venlafaxine, Abilify, clonazepam,    The patient demonstrates the following risk factors for suicide: Chronic risk factors for suicide include: psychiatric disorder of depression and chronic pain. Acute risk factors for suicide include: unemployment. Protective factors for this patient include: positive social support, coping skills and hope for the  future. Considering these factors, the overall suicide risk at this point appears to be low. Patient is appropriate for outpatient follow up.       Collaboration of Care: Collaboration of Care: Other N/A   Consent: Patient/Guardian gives verbal consent for treatment and assignment of benefits for services provided during this visit. Patient/Guardian expressed understanding and agreed to proceed.    Neysa Hotter, MD 07/08/2021, 4:34 PM

## 2021-07-08 ENCOUNTER — Other Ambulatory Visit: Payer: Self-pay

## 2021-07-08 ENCOUNTER — Telehealth (INDEPENDENT_AMBULATORY_CARE_PROVIDER_SITE_OTHER): Payer: 59 | Admitting: Psychiatry

## 2021-07-08 ENCOUNTER — Encounter: Payer: Self-pay | Admitting: Psychiatry

## 2021-07-08 DIAGNOSIS — F331 Major depressive disorder, recurrent, moderate: Secondary | ICD-10-CM

## 2021-07-08 DIAGNOSIS — G4700 Insomnia, unspecified: Secondary | ICD-10-CM

## 2021-07-08 MED ORDER — QUETIAPINE FUMARATE 25 MG PO TABS: 25.0000 mg | ORAL_TABLET | Freq: Every day | ORAL | 0 refills | Status: DC

## 2021-07-08 NOTE — Patient Instructions (Signed)
Continue duloxetine 90 mg daily Start quetiapine 25 mg at night  Continue bupropion 450 mg daily Continue trazodone 50 mg at night  Continue clonazepam 1 mg three times a day for anxiety Next appointment- 3/27 at 10 AM

## 2021-07-10 ENCOUNTER — Telehealth: Payer: 59 | Admitting: Psychiatry

## 2021-07-10 ENCOUNTER — Other Ambulatory Visit: Payer: Self-pay

## 2021-07-14 ENCOUNTER — Other Ambulatory Visit: Payer: Self-pay

## 2021-07-14 ENCOUNTER — Telehealth (INDEPENDENT_AMBULATORY_CARE_PROVIDER_SITE_OTHER): Payer: 59 | Admitting: Psychiatry

## 2021-07-14 DIAGNOSIS — F331 Major depressive disorder, recurrent, moderate: Secondary | ICD-10-CM

## 2021-07-14 NOTE — Progress Notes (Signed)
Virtual Visit via Telephone Note ? ?I connected with Ricky [name] on 07/14/21 at  2:00 PM EST by telephone and verified that I am speaking with the correct person using two identifiers. ? ?Location: ?Patient: Home ?Provider: Hospital ?  ?I discussed the limitations, risks, security and privacy concerns of performing an evaluation and management service by telephone and the availability of in person appointments. I also discussed with the patient that there may be a patient responsible charge related to this service. The patient expressed understanding and agreed to proceed. ? ? ?History of Present Illness: Patient reached by telephone.  Spoke with the patient and Ricky [name] at Ricky request together.  66 year old man referred to me by Dr. Vanetta [name] for ECT consult.  Patient reports that he has had depression since he was a teenager but the last 2 years it has been much worse.  Ricky chief complaint is that he has no energy and no motivation and no desire to do anything.  [name] confirms this.  Sits around the house doing nothing all the time.  Mood feels down and negative and hopeless.  Sleeps excessively.  This despite the fact that Ricky diabetes is actually under better control.  Appetite is decreased.  Patient denies suicidal ideation although later clarifies that at times has hopeless thoughts like that but would never act on it because of religious believes.  Denies any psychosis or hallucinations.  Currently on 450 mg a day of Wellbutrin.  Has recently been switched from Cymbalta which was not effective.  Not drinking or using any drugs. ? ?Patient has a past history of depression going back to age 64.  Feels like symptoms have been present all the time although admits there have been extended periods in the past when he was functioning much better.  Denies ever having had psychotic symptoms.  Denies manic symptoms.  Denies ever being hospitalized.  Denies any suicide attempts.  Multiple medicines recalled including  Wellbutrin and Cymbalta Abilify ? ?Patient has diabetes very well controlled on oral medicine and Victoza.  Elevated cholesterol.  No history of heart disease.  No history of stroke and no history of cranial surgery.  Has had general anesthesia in the past for orthopedic surgery with no complication to the anesthesia. ? ?Patient is married and lives with Ricky [name].  Reports good relationship at home. ? ?  ?Observations/Objective: Patient was alert oriented and appropriate during the interview.  Used [name] to fill in information as well.  The 2 of them appeared to be an appropriate agreement.  Endorse depressed mood but denied any suicidal ideation.  Denied hallucinations or paranoia.  Did not sound disorganized or confused or psychotic. ? ? ?Assessment and Plan: This is a 66 year old man with recurrent severe chronic depression worse the last 2 years nonresponsive to medication and therapy.  No contraindications to ECT.  Patient presents as an appropriate candidate for ECT treatment.  I spent time describing in detail ECT both as a general procedure and the procedure here at our hospital.  Discussed risks and benefits including side effects such as headache nausea short-term memory impairment confusion.  Patient and [name] given full opportunity to ask questions as much as they wanted.  Patient states that he would like to pursue ECT.  We agreed that I will pass on Ricky information to the ECT team and utilization review to begin work on possible scheduling for right unilateral treatment soon. ? ? ?Follow Up Instructions: ? ?  ?I discussed the assessment  and treatment plan with the patient. The patient was provided an opportunity to ask questions and all were answered. The patient agreed with the plan and demonstrated an understanding of the instructions. ?  ?The patient was advised to call back or seek an in-person evaluation if the symptoms worsen or if the condition fails to improve as anticipated. ? ?I provided 45  minutes of non-face-to-face time during this encounter. ? ? ?Ricky Rasmussen, MD  ?

## 2021-07-18 ENCOUNTER — Other Ambulatory Visit: Payer: Self-pay | Admitting: Psychiatry

## 2021-07-22 ENCOUNTER — Encounter
Admission: RE | Admit: 2021-07-22 | Discharge: 2021-07-22 | Disposition: A | Discharge status: Home / Self Care | Payer: 59 | Source: Ambulatory Visit | Attending: Psychiatry | Admitting: Psychiatry

## 2021-07-22 HISTORY — DX: Parkinson's disease: G20

## 2021-07-22 HISTORY — DX: Unspecified osteoarthritis, unspecified site: M19.90

## 2021-07-22 HISTORY — DX: Complete loss of teeth due to other specified cause, unspecified class: K08.199

## 2021-07-22 HISTORY — DX: Parkinson's disease without dyskinesia, without mention of fluctuations: G20.A1

## 2021-07-22 NOTE — Pre-Procedure Instructions (Signed)
Pre admission called done with Ricky [name],Ricky [name], allergies, medications and medical history reviewed. Patient will come for labs 07/23/21 as scheduled. ?

## 2021-07-23 ENCOUNTER — Other Ambulatory Visit: Payer: Self-pay

## 2021-07-23 ENCOUNTER — Ambulatory Visit
Admission: RE | Admit: 2021-07-23 | Discharge: 2021-07-23 | Disposition: A | Discharge status: Home / Self Care | Payer: 59 | Source: Ambulatory Visit | Attending: Psychiatry | Admitting: Psychiatry

## 2021-07-23 ENCOUNTER — Other Ambulatory Visit
Admission: RE | Admit: 2021-07-23 | Discharge: 2021-07-23 | Disposition: A | Discharge status: Home / Self Care | Payer: 59 | Source: Ambulatory Visit | Attending: Psychiatry | Admitting: Psychiatry

## 2021-07-23 ENCOUNTER — Other Ambulatory Visit: Payer: Self-pay | Admitting: Psychiatry

## 2021-07-23 DIAGNOSIS — Z01818 Encounter for other preprocedural examination: Secondary | ICD-10-CM | POA: Insufficient documentation

## 2021-07-23 DIAGNOSIS — R200 Anesthesia of skin: Secondary | ICD-10-CM

## 2021-07-23 DIAGNOSIS — Z5181 Encounter for therapeutic drug level monitoring: Secondary | ICD-10-CM | POA: Diagnosis not present

## 2021-07-23 LAB — BASIC METABOLIC PANEL
Anion gap: 7 (ref 5–15)
BUN: 13 mg/dL (ref 8–23)
CO2: 28 mmol/L (ref 22–32)
Calcium: 9.6 mg/dL (ref 8.9–10.3)
Chloride: 100 mmol/L (ref 98–111)
Creatinine, Ser: 0.97 mg/dL (ref 0.61–1.24)
GFR, Estimated: >60 mL/min (ref >60)
Glucose, Bld: 163 mg/dL — ABNORMAL HIGH (ref 70–99)
Potassium: 3.7 mmol/L (ref 3.5–5.1)
Sodium: 135 mmol/L (ref 135–145)

## 2021-07-23 LAB — CBC
HCT: 43.5 % (ref 39.0–52.0)
Hemoglobin: 14.7 g/dL (ref 13.0–17.0)
MCH: 29.9 pg (ref 26.0–34.0)
MCHC: 33.8 g/dL (ref 30.0–36.0)
MCV: 88.6 fL (ref 80.0–100.0)
Platelets: 165 10*3/uL (ref 150–400)
RBC: 4.91 MIL/uL (ref 4.22–5.81)
RDW: 12.8 % (ref 11.5–15.5)
WBC: 6.9 10*3/uL (ref 4.0–10.5)
nRBC: 0 % (ref 0.0–0.2)

## 2021-07-25 ENCOUNTER — Other Ambulatory Visit: Payer: Self-pay | Admitting: Psychiatry

## 2021-07-28 ENCOUNTER — Ambulatory Visit: Payer: Self-pay | Admitting: Certified Registered"

## 2021-07-28 ENCOUNTER — Encounter: Payer: Self-pay | Admitting: Certified Registered"

## 2021-07-28 ENCOUNTER — Encounter
Admission: RE | Admit: 2021-07-28 | Discharge: 2021-07-28 | Disposition: A | Discharge status: Home / Self Care | Payer: 59 | Source: Ambulatory Visit | Attending: Psychiatry | Admitting: Psychiatry

## 2021-07-28 ENCOUNTER — Other Ambulatory Visit: Payer: Self-pay

## 2021-07-28 DIAGNOSIS — F332 Major depressive disorder, recurrent severe without psychotic features: Secondary | ICD-10-CM | POA: Diagnosis present

## 2021-07-28 DIAGNOSIS — F419 Anxiety disorder, unspecified: Secondary | ICD-10-CM | POA: Insufficient documentation

## 2021-07-28 DIAGNOSIS — R413 Other amnesia: Secondary | ICD-10-CM | POA: Diagnosis not present

## 2021-07-28 LAB — GLUCOSE, CAPILLARY
Glucose-Capillary: 106 mg/dL — ABNORMAL HIGH (ref 70–99)
Glucose-Capillary: 111 mg/dL — ABNORMAL HIGH (ref 70–99)

## 2021-07-28 MED ORDER — SUCCINYLCHOLINE CHLORIDE 200 MG/10ML IV SOSY
PREFILLED_SYRINGE | INTRAVENOUS | Status: DC | PRN
Start: 2021-07-28 — End: 2021-07-28
  Administered 2021-07-28: 100 mg via INTRAVENOUS

## 2021-07-28 MED ORDER — MIDAZOLAM HCL 2 MG/2ML IJ SOLN: 2.0000 mg | Freq: Once | INTRAMUSCULAR | Status: DC

## 2021-07-28 MED ORDER — METHOHEXITAL SODIUM 100 MG/10ML IV SOSY
PREFILLED_SYRINGE | INTRAVENOUS | Status: DC | PRN
  Administered 2021-07-28: 80 mg via INTRAVENOUS

## 2021-07-28 MED ORDER — GLYCOPYRROLATE 0.2 MG/ML IJ SOLN: 0.1000 mg | Freq: Once | INTRAMUSCULAR | Status: DC

## 2021-07-28 MED ORDER — SODIUM CHLORIDE 0.9 % IV SOLN: 500.0000 mL | Freq: Once | INTRAVENOUS | Status: AC

## 2021-07-28 NOTE — H&P (Signed)
Ricky [name] is an 66 y.o. male.   ?Chief Complaint: Patient with recurrent severe depression and anxiety unresponsive to medication but not psychotic ?HPI: History of recurrent depression ? ?Past Medical History:  ?Diagnosis Date  ? Anxiety   ? Arthritis   ? Carpal tunnel syndrome   ? Depression   ? Diabetes (HCC)   ? Hyperlipidemia   ? Hypertension   ? Loss of teeth due to extraction   ? Parkinson's disease (HCC)   ? ? ?Past Surgical History:  ?Procedure Laterality Date  ? CARPAL TUNNEL RELEASE Right 11/28/2020  ? Procedure: CARPAL TUNNEL RELEASE ENDOSCOPIC;  Surgeon: Christena Flake, MD;  Location: [facility] ORS;  Service: Orthopedics;  Laterality: Right;  ? CARPAL TUNNEL RELEASE Left 01/22/2021  ? Procedure: CARPAL TUNNEL RELEASE ENDOSCOPIC;  Surgeon: Christena Flake, MD;  Location: [facility] ORS;  Service: Orthopedics;  Laterality: Left;  ? KNEE ARTHROSCOPY Right   ? ? ?Family History  ?Problem Relation Age of Onset  ? Depression Sister   ? Depression Brother   ? ?Social History:  reports that he has never smoked. He has never used smokeless tobacco. He reports that he does not drink alcohol and does not use drugs. ? ?Allergies:  ?Allergies  ?Allergen Reactions  ? Mirtazapine Rash  ? ? ?(Not in a hospital admission) ? ? ?Results for orders placed or performed during the hospital encounter of 07/28/21 (from the past 48 hour(s))  ?Glucose, capillary     Status: Abnormal  ? Collection Time: 07/28/21  7:00 AM  ?Result Value Ref Range  ? Glucose-Capillary 106 (H) 70 - 99 mg/dL  ?  Comment: Glucose reference range applies only to samples taken after fasting for at least 8 hours.  ? ?No results found. ? ?Review of Systems  ?Constitutional: Negative.   ?HENT: Negative.    ?Eyes: Negative.   ?Respiratory: Negative.    ?Cardiovascular: Negative.   ?Gastrointestinal: Negative.   ?Musculoskeletal: Negative.   ?Skin: Negative.   ?Neurological: Negative.   ?Psychiatric/Behavioral:  Positive for dysphoric mood. The patient is  nervous/anxious.   ? ?Blood pressure 131/79, pulse (!) 59, temperature 97.6 ?F (36.4 ?C), temperature source Tympanic, resp. rate 16, height 5\' 9"  (1.753 m), weight 81.6 kg, SpO2 98 %. ?Physical Exam ?Vitals and nursing note reviewed.  ?Constitutional:   ?   Appearance: He is well-developed.  ?HENT:  ?   Head: Normocephalic and atraumatic.  ?Eyes:  ?   Conjunctiva/sclera: Conjunctivae normal.  ?   Pupils: Pupils are equal, round, and reactive to light.  ?Cardiovascular:  ?   Heart sounds: Normal heart sounds.  ?Pulmonary:  ?   Effort: Pulmonary effort is normal.  ?Abdominal:  ?   Palpations: Abdomen is soft.  ?Musculoskeletal:     ?   General: Normal range of motion.  ?   Cervical back: Normal range of motion.  ?Skin: ?   General: Skin is warm and dry.  ?Neurological:  ?   General: No focal deficit present.  ?   Mental Status: He is alert.  ?Psychiatric:     ?   Attention and Perception: Attention normal.     ?   Mood and Affect: Mood is anxious and depressed.     ?   Speech: Speech normal.     ?   Behavior: Behavior is cooperative.     ?   Thought Content: Thought content does not include suicidal ideation.     ?   Cognition and Memory:  Cognition normal.     ?   Judgment: Judgment normal.  ?  ? ?Assessment/Plan ?Initiating ECT treatment starting today using right unilateral treatment for depression ? ?Mordecai Rasmussen, MD ?07/28/2021, 7:20 AM ? ? ? ?

## 2021-07-28 NOTE — Anesthesia Postprocedure Evaluation (Signed)
Anesthesia Post Note ? ?Patient: Ricky [name] ? ?Procedure(s) Performed: ECT TX ? ?Patient location during evaluation: PACU ?Anesthesia Type: General ?Level of consciousness: awake and alert ?Pain management: pain level controlled ?Vital Signs Assessment: post-procedure vital signs reviewed and stable ?Respiratory status: spontaneous breathing, nonlabored ventilation, respiratory function stable and patient connected to nasal cannula oxygen ?Cardiovascular status: blood pressure returned to baseline and stable ?Postop Assessment: no apparent nausea or vomiting ?Anesthetic complications: no ? ? ?No notable events documented. ? ? ?Last Vitals:  ?Vitals:  ? 07/28/21 0810 07/28/21 0900  ?BP: 116/78 123/74  ?Pulse: 66 64  ?Resp: 14 16  ?Temp:  36.5 ?C  ?SpO2: 100%   ?  ?Last Pain:  ?Vitals:  ? 07/28/21 0900  ?TempSrc: Oral  ?PainSc: 0-No pain  ? ? ?  ?  ?  ?  ?  ?  ? ?Cleda Mccreedy Haitham Dolinsky ? ? ? ? ?

## 2021-07-28 NOTE — Anesthesia Preprocedure Evaluation (Signed)
Anesthesia Evaluation  ?Patient identified by MRN, date of birth, ID band ?Patient awake ? ? ? ?Reviewed: ?Allergy & Precautions, NPO status , Patient's Chart, lab work & pertinent test results ? ?History of Anesthesia Complications ?Negative for: history of anesthetic complications ? ?Airway ?Mallampati: III ? ?TM Distance: >3 FB ?Neck ROM: full ? ? ? Dental ? ?(+) Chipped, Poor Dentition, Missing ?  ?Pulmonary ?neg shortness of breath,  ?  ?Pulmonary exam normal ? ? ? ? ? ? ? Cardiovascular ?hypertension, (-) angina(-) Past MI and (-) DOE Normal cardiovascular exam ? ? ?  ?Neuro/Psych ? Neuromuscular disease negative psych ROS  ? GI/Hepatic ?negative GI ROS, Neg liver ROS, neg GERD  ,  ?Endo/Other  ?diabetes, Type 2 ? Renal/GU ?negative Renal ROS  ?negative genitourinary ?  ?Musculoskeletal ? ? Abdominal ?  ?Peds ? Hematology ?negative hematology ROS ?(+)   ?Anesthesia Other Findings ?Past Medical History: ?No date: Anxiety ?No date: Arthritis ?No date: Carpal tunnel syndrome ?No date: Depression ?No date: Diabetes [facility]) ?No date: Hyperlipidemia ?No date: Hypertension ?No date: Loss of teeth due to extraction ?No date: Parkinson's disease (HCC) ? ?Past Surgical History: ?11/28/2020: CARPAL TUNNEL RELEASE; Right ?    Comment:  Procedure: CARPAL TUNNEL RELEASE ENDOSCOPIC;  Surgeon:  ?             Christena Flake, MD;  Location: [facility] ORS;  Service:  ?             Orthopedics;  Laterality: Right; ?01/22/2021: CARPAL TUNNEL RELEASE; Left ?    Comment:  Procedure: CARPAL TUNNEL RELEASE ENDOSCOPIC;  Surgeon:  ?             Christena Flake, MD;  Location: [facility] ORS;  Service:  ?             Orthopedics;  Laterality: Left; ?No date: KNEE ARTHROSCOPY; Right ? ?BMI   ? Body Mass Index: 26.58 kg/m?  ?  ? ? Reproductive/Obstetrics ?negative OB ROS ? ?  ? ? ? ? ? ? ? ? ? ? ? ? ? ?  ?  ? ? ? ? ? ? ? ? ?Anesthesia Physical ?Anesthesia Plan ? ?ASA: 3 ? ?Anesthesia Plan: General  ? ?Post-op Pain  Management:   ? ?Induction: Intravenous ? ?PONV Risk Score and Plan: Propofol infusion and TIVA ? ?Airway Management Planned: Mask ? ?Additional Equipment:  ? ?Intra-op Plan:  ? ?Post-operative Plan:  ? ?Informed Consent: I have reviewed the patients History and Physical, chart, labs and discussed the procedure including the risks, benefits and alternatives for the proposed anesthesia with the patient or authorized representative who has indicated his/her understanding and acceptance.  ? ? ? ?Dental Advisory Given ? ?Plan Discussed with: Anesthesiologist, CRNA and Surgeon ? ?Anesthesia Plan Comments: (Patient consented for risks of anesthesia including but not limited to:  ?- adverse reactions to medications ?- risk of airway placement if required ?- damage to eyes, teeth, lips or other oral mucosa ?- nerve damage due to positioning  ?- sore throat or hoarseness ?- Damage to heart, brain, nerves, lungs, other parts of body or loss of life ? ?Patient voiced understanding.)  ? ? ? ? ? ? ?Anesthesia Quick Evaluation ? ?

## 2021-07-28 NOTE — Procedures (Signed)
ECT SERVICES ?Physician?s Interval Evaluation & Treatment Note ? ?Patient Identification: Ricky [name] ?MRN:  [id number] ?Date of Evaluation:  07/28/2021 ?TX #: 1 ? ?MADRS:  ? ?MMSE:  ? ?P.E. Findings: ? ?Appears to be largely healthy.  Heart and lungs normal.  Normal physical. ? ?Psychiatric Interval Note: ? ?Depressed withdrawn and flat ? ?Subjective: ? ?Patient is a 66 y.o. male seen for evaluation for Electroconvulsive Therapy. ?Feeling anxious and depressed ? ?Treatment Summary: ? ? [x]   Right Unilateral             []  Bilateral ?  ?% Energy : 0.3 ms 65% ? ? Impedance: 1750 ohms ? ?Seizure Energy Index: 1058 ?V squared ? ?Postictal Suppression Index: No reading ? ?Seizure Concordance Index: 81% ? ?Medications ? ?Pre Shock: Brevital 80 mg succinylcholine 100 mg ? ?Post Shock:   ? ?Seizure Duration: 10 seconds EMG 11 seconds EEG ? ? ?Comments: ?Extremely short seizure.  We will need to double energy next time or at least go up to 100% if this is not working he will need to switch to ketamine ? ?Lungs: ? ?[x]   Clear to auscultation              ? ?[]  Other:  ? ?Heart: ?  ? [x]   Regular rhythm             []  irregular rhythm ? ? ? [x]   Previous H&P reviewed, patient examined and there are NO CHANGES              ?  ? []   Previous H&P reviewed, patient examined and there are changes noted. ? ? ? , MD ?3/20/20234:26 PM ? ?  ?

## 2021-07-28 NOTE — Transfer of Care (Signed)
Immediate Anesthesia Transfer of Care Note ? ?Patient: Ricky [name] ? ?Procedure(s) Performed: ECT TX ? ?Patient Location: PACU ? ?Anesthesia Type:General ? ?Level of Consciousness: drowsy ? ?Airway & Oxygen Therapy: Patient Spontanous Breathing and Patient connected to nasal cannula oxygen ? ?Post-op Assessment: Report given to RN ? ?Post vital signs: stable ? ?Last Vitals:  ?Vitals Value Taken Time  ?BP    ?Temp    ?Pulse 71 07/28/21 0754  ?Resp    ?SpO2 98 % 07/28/21 0754  ?Vitals shown include unvalidated device data. ? ?Last Pain:  ?Vitals:  ? 07/28/21 0700  ?TempSrc:   ?PainSc: 0-No pain  ?   ? ?  ? ?Complications: No notable events documented. ?

## 2021-07-29 ENCOUNTER — Other Ambulatory Visit: Payer: Self-pay | Admitting: Psychiatry

## 2021-07-30 ENCOUNTER — Encounter: Payer: Self-pay | Admitting: Anesthesiology

## 2021-07-30 ENCOUNTER — Other Ambulatory Visit: Payer: Self-pay | Admitting: Psychiatry

## 2021-07-30 ENCOUNTER — Encounter (HOSPITAL_BASED_OUTPATIENT_CLINIC_OR_DEPARTMENT_OTHER)
Admission: RE | Admit: 2021-07-30 | Discharge: 2021-07-30 | Disposition: A | Discharge status: Home / Self Care | Payer: 59 | Source: Ambulatory Visit | Attending: Psychiatry | Admitting: Psychiatry

## 2021-07-30 ENCOUNTER — Ambulatory Visit: Payer: Self-pay | Admitting: Anesthesiology

## 2021-07-30 DIAGNOSIS — F332 Major depressive disorder, recurrent severe without psychotic features: Secondary | ICD-10-CM | POA: Diagnosis not present

## 2021-07-30 LAB — GLUCOSE, CAPILLARY
Glucose-Capillary: 134 mg/dL — ABNORMAL HIGH (ref 70–99)
Glucose-Capillary: 134 mg/dL — ABNORMAL HIGH (ref 70–99)

## 2021-07-30 MED ORDER — SUCCINYLCHOLINE CHLORIDE 200 MG/10ML IV SOSY
PREFILLED_SYRINGE | INTRAVENOUS | Status: DC | PRN
  Administered 2021-07-30: 100 mg via INTRAVENOUS

## 2021-07-30 MED ORDER — ACETAMINOPHEN 160 MG/5ML PO SOLN
325.0000 mg | ORAL | Status: DC | PRN
  Filled 2021-07-30: qty 20.3

## 2021-07-30 MED ORDER — ACETAMINOPHEN 325 MG PO TABS: 650.0000 mg | ORAL_TABLET | Freq: Once | ORAL | Status: DC | PRN

## 2021-07-30 MED ORDER — METHOHEXITAL SODIUM 100 MG/10ML IV SOSY
PREFILLED_SYRINGE | INTRAVENOUS | Status: DC | PRN
  Administered 2021-07-30: 80 mg via INTRAVENOUS

## 2021-07-30 MED ORDER — SODIUM CHLORIDE 0.9 % IV SOLN: INTRAVENOUS | Status: DC | PRN

## 2021-07-30 MED ORDER — KETOROLAC TROMETHAMINE 30 MG/ML IJ SOLN: 30.0000 mg | Freq: Once | INTRAMUSCULAR | Status: DC

## 2021-07-30 MED ORDER — SODIUM CHLORIDE 0.9 % IV SOLN
500.0000 mL | Freq: Once | INTRAVENOUS | Status: AC
  Administered 2021-07-30: 500 mL via INTRAVENOUS

## 2021-07-30 MED ORDER — ONDANSETRON HCL 4 MG/2ML IJ SOLN: 4.0000 mg | Freq: Once | INTRAMUSCULAR | Status: DC | PRN

## 2021-07-30 MED ORDER — KETOROLAC TROMETHAMINE 30 MG/ML IJ SOLN
INTRAMUSCULAR | Status: AC
  Administered 2021-07-30: 30 mg
  Filled 2021-07-30: qty 1

## 2021-07-30 NOTE — Transfer of Care (Signed)
Immediate Anesthesia Transfer of Care Note ? ?Patient: Ricky [name] ? ?Procedure(s) Performed: ECT TX ? ?Patient Location: PACU ? ?Anesthesia Type:General ? ?Level of Consciousness: awake and sedated ? ?Airway & Oxygen Therapy: Patient Spontanous Breathing and Patient connected to face mask oxygen ? ?Post-op Assessment: Report given to RN and Post -op Vital signs reviewed and stable ? ?Post vital signs: Reviewed and stable ? ?Last Vitals:  ?Vitals Value Taken Time  ?BP 169/95 07/30/21 1234  ?Temp    ?Pulse 71 07/30/21 1236  ?Resp 15 07/30/21 1236  ?SpO2 100 % 07/30/21 1236  ?Vitals shown include unvalidated device data. ? ?Last Pain:  ?Vitals:  ? 07/30/21 1000  ?TempSrc: Tympanic  ?PainSc: 0-No pain  ?   ? ?  ? ?Complications: No notable events documented. ?

## 2021-07-30 NOTE — Progress Notes (Deleted)
BH MD/PA/NP OP Progress Note ? ?07/30/2021 8:57 AM ?Ricky [name]  ?MRN:  [id number] ? ?Chief Complaint: No chief complaint on file. ? ?HPI:  ?- started ECT ? ?Visit Diagnosis: No diagnosis found. ? ?Past Psychiatric History: Please see initial evaluation for full details. I have reviewed the history. No updates at this time.  ?  ? ?Past Medical History:  ?Past Medical History:  ?Diagnosis Date  ? Anxiety   ? Arthritis   ? Carpal tunnel syndrome   ? Depression   ? Diabetes ([place])   ? Hyperlipidemia   ? Hypertension   ? Loss of teeth due to extraction   ? Parkinson's disease ([place])   ?  ?Past Surgical History:  ?Procedure Laterality Date  ? CARPAL TUNNEL RELEASE Right 11/28/2020  ? Procedure: CARPAL TUNNEL RELEASE ENDOSCOPIC;  Surgeon: Corky Mull, MD;  Location: [facility] ORS;  Service: Orthopedics;  Laterality: Right;  ? CARPAL TUNNEL RELEASE Left 01/22/2021  ? Procedure: CARPAL TUNNEL RELEASE ENDOSCOPIC;  Surgeon: Corky Mull, MD;  Location: [facility] ORS;  Service: Orthopedics;  Laterality: Left;  ? KNEE ARTHROSCOPY Right   ? ? ?Family Psychiatric History: Please see initial evaluation for full details. I have reviewed the history. No updates at this time.  ?  ? ?Family History:  ?Family History  ?Problem Relation Age of Onset  ? Depression Sister   ? Depression Brother   ? ? ?Social History:  ?Social History  ? ?Socioeconomic History  ? Marital status: Significant Other  ?  Spouse name: Butch Penny  ? Number of children: Not on file  ? Years of education: Not on file  ? Highest education level: Not on file  ?Occupational History  ? Not on file  ?Tobacco Use  ? Smoking status: Never  ? Smokeless tobacco: Never  ?Vaping Use  ? Vaping Use: Never used  ?Substance and Sexual Activity  ? Alcohol use: Never  ? Drug use: Never  ? Sexual activity: Not on file  ?Other Topics Concern  ? Not on file  ?Social History Narrative  ? Lives with friend  ? ?Social Determinants of Health  ? ?Financial Resource Strain: Not on file  ?Food Insecurity:  Not on file  ?Transportation Needs: Not on file  ?Physical Activity: Not on file  ?Stress: Not on file  ?Social Connections: Not on file  ? ? ?Allergies:  ?Allergies  ?Allergen Reactions  ? Mirtazapine Rash  ? ? ?Metabolic Disorder Labs: ?No results found for: HGBA1C, MPG ?No results found for: PROLACTIN ?No results found for: CHOL, TRIG, HDL, CHOLHDL, VLDL, LDLCALC ?No results found for: TSH ? ?Therapeutic Level Labs: ?No results found for: LITHIUM ?No results found for: VALPROATE ?No components found for:  CBMZ ? ?Current Medications: ?Current Outpatient Medications  ?Medication Sig Dispense Refill  ? amoxicillin (AMOXIL) 500 MG capsule Take by mouth.    ? atenolol (TENORMIN) 50 MG tablet Take 50 mg by mouth daily.    ? buPROPion (WELLBUTRIN XL) 150 MG 24 hr tablet Take 1 tablet (150 mg total) by mouth daily. Total of 450 mg daily. Take along with 300 mg tab 90 tablet 0  ? buPROPion (WELLBUTRIN XL) 300 MG 24 hr tablet Take 1 tablet (300 mg total) by mouth daily. Total of 450 mg daily. Take along with 150 mg tab 90 tablet 0  ? carbidopa-levodopa (SINEMET IR) 25-100 MG tablet Take 1 tablet by mouth 5 (five) times daily.    ? chlorthalidone (HYGROTON) 25 MG tablet Take 25 mg  by mouth daily.    ? clonazePAM (KLONOPIN) 1 MG tablet Take 1 mg by mouth in the morning, at noon, and at bedtime.    ? DULoxetine (CYMBALTA) 30 MG capsule Take 3 capsules (90 mg total) by mouth daily. 270 capsule 0  ? enalapril (VASOTEC) 10 MG tablet Take 10 mg by mouth daily.    ? etodolac (LODINE) 500 MG tablet Take 500 mg by mouth as needed.    ? ezetimibe (ZETIA) 10 MG tablet Take 10 mg by mouth daily.    ? gabapentin (NEURONTIN) 100 MG capsule 300 mg at bedtime    ? ketoconazole (NIZORAL) 2 % shampoo Apply 1 application topically every other day.    ? metFORMIN (GLUCOPHAGE) 500 MG tablet Take 500 mg by mouth 2 (two) times daily.    ? metFORMIN (GLUCOPHAGE) 500 MG tablet Take by mouth.    ? QUEtiapine (SEROQUEL) 25 MG tablet Take 1 tablet  (25 mg total) by mouth at bedtime. 30 tablet 0  ? QUEtiapine (SEROQUEL) 25 MG tablet Take 1 tablet (25 mg total) by mouth at bedtime. 30 tablet 0  ? rosuvastatin (CRESTOR) 10 MG tablet Take 1 tablet by mouth daily.    ? sildenafil (VIAGRA) 50 MG tablet Take 50 mg by mouth daily as needed for erectile dysfunction.    ? simvastatin (ZOCOR) 20 MG tablet Take 20 mg by mouth daily.    ? traZODone (DESYREL) 50 MG tablet Take 50 mg by mouth at bedtime.    ? VICTOZA 18 MG/3ML SOPN Inject 1.2 mg into the skin daily.    ? ?No current facility-administered medications for this visit.  ? ? ? ?Musculoskeletal: ?Strength & Muscle Tone: within normal limits ?Gait & Station: {PE GAIT ED NATL:22525} ?Patient leans: N/A ? ?Psychiatric Specialty Exam: ?Review of Systems  ?There were no vitals taken for this visit.There is no height or weight on file to calculate BMI.  ?General Appearance: {Appearance:22683}  ?Eye Contact:  {BHH EYE CONTACT:22684}  ?Speech:  Clear and Coherent  ?Volume:  Normal  ?Mood:  {BHH MOOD:22306}  ?Affect:  {Affect (PAA):22687}  ?Thought Process:  Coherent  ?Orientation:  Full (Time, Place, and Person)  ?Thought Content: Logical   ?Suicidal Thoughts:  {ST/HT (PAA):22692}  ?Homicidal Thoughts:  {ST/HT (PAA):22692}  ?Memory:  Immediate;   Good  ?Judgement:  {Judgement (PAA):22694}  ?Insight:  {Insight (PAA):22695}  ?Psychomotor Activity:  Normal  ?Concentration:  Concentration: Good and Attention Span: Good  ?Recall:  Good  ?Fund of Knowledge: Good  ?Language: Good  ?Akathisia:  No  ?Handed:  Right  ?AIMS (if indicated): not done  ?Assets:  Communication Skills ?Desire for Improvement  ?ADL's:  Intact  ?Cognition: WNL  ?Sleep:  {BHH GOOD/FAIR/POOR:22877}  ? ?Screenings: ?ECT-MADRS   ? ?Flowsheet Row ECT Treatment from 07/28/2021 in [place]  ?MADRS Total Score 41  ? ?  ? ?Mini-Mental   ? ?Flowsheet Row ECT Treatment from 07/28/2021 in [place]   ?Total Score (max 30 points ) 30  ? ?  ? ?PHQ2-9   ? ?[place] Office Visit from 04/07/2021 in [place] Office Visit from 10/15/2020 in [place] Office Visit from 09/24/2020 in [place]  ?PHQ-2 Total Score 6 0 6  ?PHQ-9 Total Score 22 -- 19  ? ?  ? ?Flowsheet Row ECT Treatment from 07/28/2021 in [place] Office Visit from  04/07/2021 in [place] [facility] from 01/21/2021 in [place]  ?C-SSRS RISK CATEGORY No Risk No Risk No Risk  ? ?  ? ? ? ?Assessment and Plan:  ?Ricky [name] is a 66 y.o. year old male with a history of depression, anxiety, parkinsonism on levodopa, diabetes, hypertension, hyperlipidemia, carpal tunnel syndrome, s/p knee arthroscopy, who presents for follow up appointment for below.  ?  ?1. MDD (major depressive disorder), recurrent episode, moderate ([place]) ?He continues to report depressive symptoms since the last visit. Psychosocial stressors includes demoralization secondary to pain and unemployment.  He was unable to receive and quetiapine, and has not been able to start this medication as discussed.  He is willing to try this medication as adjunctive treatment for depression.  Discussed potential metabolic side effect, and the worsening in and EPS.  Will continue current dose of duloxetine to target depression.  Will consider switching this to other antidepressant if he has limited benefit from quetiapine.  Referral for ECT was done given patient/his wife's strongly interest.  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  Noted that although his depressive symptoms are more prominent with apathy, both the patient and his wife denies any apparent cognitive issues to be concerned of neuropsychiatric symptoms related to cognitive  impairment. Will continue to monitor.  ?  ?2. Insomnia, unspecified type ?Unchanged. He reports profound fatigue and hypersomnia. Although he was reportedly having snoring when he sleeps deeply, his wife is not in

## 2021-07-30 NOTE — Anesthesia Preprocedure Evaluation (Addendum)
Anesthesia Evaluation  ?Patient identified by MRN, date of birth, ID band ?Patient awake ? ? ? ?Reviewed: ?Allergy & Precautions, NPO status , Patient's Chart, lab work & pertinent test results ? ?History of Anesthesia Complications ?Negative for: history of anesthetic complications ? ?Airway ?Mallampati: IV ? ? ?Neck ROM: Full ? ? ? Dental ? ?(+) Edentulous Upper, Edentulous Lower ?  ?Pulmonary ?neg pulmonary ROS,  ?  ?Pulmonary exam normal ?breath sounds clear to auscultation ? ? ? ? ? ? Cardiovascular ?hypertension, Normal cardiovascular exam ?Rhythm:Regular Rate:Normal ? ?ECG 07/23/21: NSR, inferolateral T wave flattening ?  ?Neuro/Psych ?PSYCHIATRIC DISORDERS Anxiety Depression  Neuromuscular disease (Parkinson disease)   ? GI/Hepatic ?negative GI ROS,   ?Endo/Other  ?diabetes, Type 2 ? Renal/GU ?negative Renal ROS  ? ?  ?Musculoskeletal ? ?(+) Arthritis ,  ? Abdominal ?  ?Peds ? Hematology ?negative hematology ROS ?(+)   ?Anesthesia Other Findings ? ? Reproductive/Obstetrics ? ?  ? ? ? ? ? ? ? ? ? ? ? ? ? ?  ?  ? ? ? ? ? ? ? ?Anesthesia Physical ?Anesthesia Plan ? ?ASA: 2 ? ?Anesthesia Plan: General  ? ?Post-op Pain Management:   ? ?Induction: Intravenous ? ?PONV Risk Score and Plan: 2 and TIVA and Treatment may vary due to age or medical condition ? ?Airway Management Planned: Natural Airway ? ?Additional Equipment:  ? ?Intra-op Plan:  ? ?Post-operative Plan:  ? ?Informed Consent: I have reviewed the patients History and Physical, chart, labs and discussed the procedure including the risks, benefits and alternatives for the proposed anesthesia with the patient or authorized representative who has indicated his/her understanding and acceptance.  ? ? ? ? ? ?Plan Discussed with: CRNA ? ?Anesthesia Plan Comments: (LMA/GETA backup discussed.  Serial consent on chart.  Patient consented for risks of anesthesia including but not limited to:  ?- adverse reactions to medications ?-  damage to eyes, teeth, lips or other oral mucosa ?- nerve damage due to positioning  ?- sore throat or hoarseness ?- damage to heart, brain, nerves, lungs, other parts of body or loss of life ? ?Informed patient about role of CRNA in peri- and intra-operative care.  Patient voiced understanding.)  ? ? ? ? ? ? ?Anesthesia Quick Evaluation ? ?

## 2021-07-30 NOTE — H&P (Signed)
Ricky [name] is an 66 y.o. male.   ?Chief Complaint: Continues depressed no change from previously ?HPI: History of recurrent severe depression ? ?Past Medical History:  ?Diagnosis Date  ? Anxiety   ? Arthritis   ? Carpal tunnel syndrome   ? Depression   ? Diabetes (HCC)   ? Hyperlipidemia   ? Hypertension   ? Loss of teeth due to extraction   ? Parkinson's disease (HCC)   ? ? ?Past Surgical History:  ?Procedure Laterality Date  ? CARPAL TUNNEL RELEASE Right 11/28/2020  ? Procedure: CARPAL TUNNEL RELEASE ENDOSCOPIC;  Surgeon: Christena Flake, MD;  Location: [facility] ORS;  Service: Orthopedics;  Laterality: Right;  ? CARPAL TUNNEL RELEASE Left 01/22/2021  ? Procedure: CARPAL TUNNEL RELEASE ENDOSCOPIC;  Surgeon: Christena Flake, MD;  Location: [facility] ORS;  Service: Orthopedics;  Laterality: Left;  ? KNEE ARTHROSCOPY Right   ? ? ?Family History  ?Problem Relation Age of Onset  ? Depression Sister   ? Depression Brother   ? ?Social History:  reports that he has never smoked. He has never used smokeless tobacco. He reports that he does not drink alcohol and does not use drugs. ? ?Allergies:  ?Allergies  ?Allergen Reactions  ? Mirtazapine Rash  ? ? ?(Not in a hospital admission) ? ? ?Results for orders placed or performed during the hospital encounter of 07/30/21 (from the past 48 hour(s))  ?Glucose, capillary     Status: Abnormal  ? Collection Time: 07/30/21 11:09 AM  ?Result Value Ref Range  ? Glucose-Capillary 134 (H) 70 - 99 mg/dL  ?  Comment: Glucose reference range applies only to samples taken after fasting for at least 8 hours.  ?Glucose, capillary     Status: Abnormal  ? Collection Time: 07/30/21 12:40 PM  ?Result Value Ref Range  ? Glucose-Capillary 134 (H) 70 - 99 mg/dL  ?  Comment: Glucose reference range applies only to samples taken after fasting for at least 8 hours.  ? ?No results found. ? ?Review of Systems  ?Constitutional: Negative.   ?HENT: Negative.    ?Eyes: Negative.   ?Respiratory: Negative.     ?Cardiovascular: Negative.   ?Gastrointestinal: Negative.   ?Musculoskeletal: Negative.   ?Skin: Negative.   ?Neurological: Negative.   ?Psychiatric/Behavioral:  Positive for dysphoric mood.   ? ?Blood pressure 123/82, pulse 77, temperature (!) 97.4 ?F (36.3 ?C), temperature source Tympanic, resp. rate 18, height 5\' 9"  (1.753 m), weight 81.6 kg, SpO2 100 %. ?Physical Exam ?Vitals and nursing note reviewed.  ?Constitutional:   ?   Appearance: He is well-developed.  ?HENT:  ?   Head: Normocephalic and atraumatic.  ?Eyes:  ?   Conjunctiva/sclera: Conjunctivae normal.  ?   Pupils: Pupils are equal, round, and reactive to light.  ?Cardiovascular:  ?   Heart sounds: Normal heart sounds.  ?Pulmonary:  ?   Effort: Pulmonary effort is normal.  ?Abdominal:  ?   Palpations: Abdomen is soft.  ?Musculoskeletal:     ?   General: Normal range of motion.  ?   Cervical back: Normal range of motion.  ?Skin: ?   General: Skin is warm and dry.  ?Neurological:  ?   General: No focal deficit present.  ?   Mental Status: He is alert.  ?Psychiatric:     ?   Attention and Perception: Attention normal.     ?   Mood and Affect: Mood is depressed.     ?   Speech: Speech is delayed.     ?  Behavior: Behavior is slowed.     ?   Thought Content: Thought content does not include suicidal ideation.  ?  ? ?Assessment/Plan ?We are hoping to continue the course as this is treatment #2.  Right unilateral treatment. ? ?Mordecai Rasmussen, MD ?07/30/2021, 5:19 PM ? ? ? ?

## 2021-07-30 NOTE — Anesthesia Procedure Notes (Signed)
Date/Time: 07/30/2021 12:15 PM ?Performed by: Nelda Marseille, CRNA ?Oxygen Delivery Method: Circle system utilized ?Preoxygenation: Pre-oxygenation with 100% oxygen ?Ventilation: Mask ventilation throughout procedure ? ? ? ? ?

## 2021-07-30 NOTE — Anesthesia Postprocedure Evaluation (Signed)
Anesthesia Post Note ? ?Patient: Ricky [name] ? ?Procedure(s) Performed: ECT TX ? ?Patient location during evaluation: PACU ?Anesthesia Type: General ?Level of consciousness: awake and alert, oriented and patient cooperative ?Pain management: pain level controlled ?Vital Signs Assessment: post-procedure vital signs reviewed and stable ?Respiratory status: spontaneous breathing, nonlabored ventilation and respiratory function stable ?Cardiovascular status: blood pressure returned to baseline and stable ?Postop Assessment: adequate PO intake ?Anesthetic complications: no ? ? ?No notable events documented. ? ? ?Last Vitals:  ?Vitals:  ? 07/30/21 1248 07/30/21 1258  ?BP: 134/85 126/85  ?Pulse: 77 77  ?Resp: 18 19  ?Temp: (!) 36.2 ?C (!) 36.3 ?C  ?SpO2:  100%  ?  ?Last Pain:  ?Vitals:  ? 07/30/21 1248  ?TempSrc: Temporal  ?PainSc: 0-No pain  ? ? ?  ?  ?  ?  ?  ?  ? ?Reed Breech ? ? ? ? ?

## 2021-07-30 NOTE — Progress Notes (Signed)
Patient awake/alert x4. Talkative no c/o's   ?

## 2021-07-30 NOTE — Procedures (Signed)
ECT SERVICES ?Physician?s Interval Evaluation & Treatment Note ? ?Patient Identification: Ricky [name] ?MRN:  [id number] ?Date of Evaluation:  07/30/2021 ?TX #: 2 ? ?MADRS:  ? ?MMSE:  ? ?P.E. Findings: ? ?No change to physical exam. ? ?Psychiatric Interval Note: ? ?Somewhat less nervous I think than previously ? ?Subjective: ? ?Patient is a 66 y.o. male seen for evaluation for Electroconvulsive Therapy. ?No complaint ? ?Treatment Summary: ? ? [x]   Right Unilateral             []  Bilateral ?  ?% Energy : 0.3 ms 100% ? ? Impedance: 1350 ohms ? ?Seizure Energy Index: 1040 ?V squared ? ?Postictal Suppression Index: 52% ? ?Seizure Concordance Index: 85% ? ?Medications ? ?Pre Shock: Toradol 30 mg Brevital 80 mg succinylcholine 100 mg ? ?Post Shock:   ? ?Seizure Duration: 33 seconds EMG 33 seconds EEG ? ? ?Comments: ?Better quality seizure.  Continue current plan Next treatment Friday ? ?Lungs: ? ?[x]   Clear to auscultation              ? ?[]  Other:  ? ?Heart: ?  ? [x]   Regular rhythm             []  irregular rhythm ? ? ? [x]   Previous H&P reviewed, patient examined and there are NO CHANGES              ?  ? []   Previous H&P reviewed, patient examined and there are changes noted. ? ? ? , MD ?3/22/20235:20 PM ? ?  ?

## 2021-07-31 ENCOUNTER — Other Ambulatory Visit: Payer: Self-pay | Admitting: Psychiatry

## 2021-08-01 ENCOUNTER — Other Ambulatory Visit: Payer: Self-pay | Admitting: Psychiatry

## 2021-08-01 ENCOUNTER — Other Ambulatory Visit: Payer: Self-pay

## 2021-08-01 ENCOUNTER — Encounter: Payer: Self-pay | Admitting: Registered Nurse

## 2021-08-01 ENCOUNTER — Encounter (HOSPITAL_BASED_OUTPATIENT_CLINIC_OR_DEPARTMENT_OTHER)
Admission: RE | Admit: 2021-08-01 | Discharge: 2021-08-01 | Disposition: A | Discharge status: Home / Self Care | Payer: 59 | Source: Ambulatory Visit | Attending: Psychiatry | Admitting: Psychiatry

## 2021-08-01 DIAGNOSIS — F332 Major depressive disorder, recurrent severe without psychotic features: Secondary | ICD-10-CM | POA: Diagnosis not present

## 2021-08-01 MED ORDER — SODIUM CHLORIDE 0.9 % IV SOLN: 500.0000 mL | Freq: Once | INTRAVENOUS | Status: DC

## 2021-08-01 MED ORDER — GLYCOPYRROLATE 0.2 MG/ML IJ SOLN
INTRAMUSCULAR | Status: AC
  Administered 2021-08-01: 0.1 mg via INTRAVENOUS
  Filled 2021-08-01: qty 1

## 2021-08-01 MED ORDER — SODIUM CHLORIDE 0.9 % IV SOLN: INTRAVENOUS | Status: DC | PRN

## 2021-08-01 MED ORDER — ONDANSETRON HCL 4 MG/2ML IJ SOLN: 4.0000 mg | Freq: Once | INTRAMUSCULAR | Status: DC | PRN

## 2021-08-01 MED ORDER — METHOHEXITAL SODIUM 100 MG/10ML IV SOSY
PREFILLED_SYRINGE | INTRAVENOUS | Status: DC | PRN
Start: 2021-08-01 — End: 2021-08-01
  Administered 2021-08-01: 80 mg via INTRAVENOUS

## 2021-08-01 MED ORDER — SUCCINYLCHOLINE CHLORIDE 200 MG/10ML IV SOSY
PREFILLED_SYRINGE | INTRAVENOUS | Status: DC | PRN
  Administered 2021-08-01: 100 mg via INTRAVENOUS

## 2021-08-01 MED ORDER — ACETAMINOPHEN 325 MG PO TABS
650.0000 mg | ORAL_TABLET | Freq: Once | ORAL | Status: DC | PRN
Start: 2021-08-01 — End: 2021-08-02

## 2021-08-01 MED ORDER — GLYCOPYRROLATE 0.2 MG/ML IJ SOLN: 0.1000 mg | Freq: Once | INTRAMUSCULAR | Status: AC

## 2021-08-01 MED ORDER — KETOROLAC TROMETHAMINE 30 MG/ML IJ SOLN
INTRAMUSCULAR | Status: AC
  Administered 2021-08-01: 30 mg via INTRAVENOUS
  Filled 2021-08-01: qty 1

## 2021-08-01 MED ORDER — MIDAZOLAM HCL 2 MG/2ML IJ SOLN
2.0000 mg | Freq: Once | INTRAMUSCULAR | Status: DC
Start: 2021-08-01 — End: 2021-08-02

## 2021-08-01 MED ORDER — ACETAMINOPHEN 160 MG/5ML PO SOLN
325.0000 mg | ORAL | Status: DC | PRN
  Filled 2021-08-01: qty 20.3

## 2021-08-01 MED ORDER — KETOROLAC TROMETHAMINE 30 MG/ML IJ SOLN: 30.0000 mg | Freq: Once | INTRAMUSCULAR | Status: AC

## 2021-08-01 NOTE — Procedures (Signed)
ECT SERVICES ?Physician?s Interval Evaluation & Treatment Note ? ?Patient Identification: Ricky [name] ?MRN:  [id number] ?Date of Evaluation:  08/01/2021 ?TX #: 3 ? ?MADRS:  ? ?MMSE:  ? ?P.E. Findings: ? ?No change to physical exam ? ?Psychiatric Interval Note: ? ?Affect a little calmer.  No suicidal ideation ? ?Subjective: ? ?Patient is a 66 y.o. male seen for evaluation for Electroconvulsive Therapy. ?Patient not necessarily feeling any different ? ?Treatment Summary: ? ? [x]   Right Unilateral             []  Bilateral ?  ?% Energy : 0.3 ms 100% ? ? Impedance: 990 ohms ? ?Seizure Energy Index: 1833 ?V squared ? ?Postictal Suppression Index: 49% ? ?Seizure Concordance Index: 81% ? ?Medications ? ?Pre Shock: Toradol 30 mg Brevital 80 mg succinylcholine 100 mg ? ?Post Shock:   ? ?Seizure Duration: 36 seconds EMG 36 seconds EEG ? ? ?Comments: ?Follow-up all through next week. ? ?Lungs: ? ?[x]   Clear to auscultation              ? ?[]  Other:  ? ?Heart: ?  ? [x]   Regular rhythm             []  irregular rhythm ? ? ? [x]   Previous H&P reviewed, patient examined and there are NO CHANGES              ?  ? []   Previous H&P reviewed, patient examined and there are changes noted. ? ? ? , MD ?3/24/20235:35 PM ? ?  ?

## 2021-08-01 NOTE — Anesthesia Preprocedure Evaluation (Signed)
Anesthesia Evaluation  ?Patient identified by MRN, date of birth, ID band ?Patient awake ? ? ? ?Reviewed: ?Allergy & Precautions, NPO status , Patient's Chart, lab work & pertinent test results ? ?History of Anesthesia Complications ?Negative for: history of anesthetic complications ? ?Airway ?Mallampati: IV ? ? ?Neck ROM: Full ? ? ? Dental ? ?(+) Edentulous Upper, Edentulous Lower ?  ?Pulmonary ?neg pulmonary ROS,  ?  ?Pulmonary exam normal ?breath sounds clear to auscultation ? ? ? ? ? ? Cardiovascular ?hypertension, Normal cardiovascular exam ?Rhythm:Regular Rate:Normal ? ?ECG 07/23/21: NSR, inferolateral T wave flattening ?  ?Neuro/Psych ?PSYCHIATRIC DISORDERS Anxiety Depression  Neuromuscular disease (Parkinson disease)   ? GI/Hepatic ?negative GI ROS,   ?Endo/Other  ?diabetes, Type 2 ? Renal/GU ?negative Renal ROS  ? ?  ?Musculoskeletal ? ?(+) Arthritis ,  ? Abdominal ?  ?Peds ? Hematology ?negative hematology ROS ?(+)   ?Anesthesia Other Findings ? ? Reproductive/Obstetrics ? ?  ? ? ? ? ? ? ? ? ? ? ? ? ? ?  ?  ? ? ? ? ? ? ? ? ?Anesthesia Physical ? ?Anesthesia Plan ? ?ASA: 2 ? ?Anesthesia Plan: General  ? ?Post-op Pain Management:   ? ?Induction: Intravenous ? ?PONV Risk Score and Plan: 2 and TIVA and Treatment may vary due to age or medical condition ? ?Airway Management Planned: Natural Airway ? ?Additional Equipment:  ? ?Intra-op Plan:  ? ?Post-operative Plan:  ? ?Informed Consent: I have reviewed the patients History and Physical, chart, labs and discussed the procedure including the risks, benefits and alternatives for the proposed anesthesia with the patient or authorized representative who has indicated his/her understanding and acceptance.  ? ? ? ? ? ?Plan Discussed with: CRNA ? ?Anesthesia Plan Comments: (LMA/GETA backup discussed.  Serial consent on chart.  Patient consented for risks of anesthesia including but not limited to:  ?- adverse reactions to medications ?-  damage to eyes, teeth, lips or other oral mucosa ?- nerve damage due to positioning  ?- sore throat or hoarseness ?- damage to heart, brain, nerves, lungs, other parts of body or loss of life ? ?Informed patient about role of CRNA in peri- and intra-operative care.  Patient voiced understanding.)  ? ? ? ? ? ? ?Anesthesia Quick Evaluation ? ?

## 2021-08-01 NOTE — H&P (Signed)
Ricky [name] is an 66 y.o. male.   ?Chief Complaint: Patient himself has no new complaint.  Some memory impairment.  Wife has noticed improvement in affect and energy ?HPI: History of recurrent depression ? ?Past Medical History:  ?Diagnosis Date  ? Anxiety   ? Arthritis   ? Carpal tunnel syndrome   ? Depression   ? Diabetes (HCC)   ? Hyperlipidemia   ? Hypertension   ? Loss of teeth due to extraction   ? Parkinson's disease (HCC)   ? ? ?Past Surgical History:  ?Procedure Laterality Date  ? CARPAL TUNNEL RELEASE Right 11/28/2020  ? Procedure: CARPAL TUNNEL RELEASE ENDOSCOPIC;  Surgeon: Christena Flake, MD;  Location: [facility] ORS;  Service: Orthopedics;  Laterality: Right;  ? CARPAL TUNNEL RELEASE Left 01/22/2021  ? Procedure: CARPAL TUNNEL RELEASE ENDOSCOPIC;  Surgeon: Christena Flake, MD;  Location: [facility] OR[facility];  Service: Orthopedics;  Laterality: Left;  ? KNEE ARTHROSCOPY Right   ? ? ?Family History  ?Problem Relation Age of Onset  ? Depression Sister   ? Depression Brother   ? ?Social History:  reports that he has never smoked. He has never used smokeless tobacco. He reports that he does not drink alcohol and does not use drugs. ? ?Allergies:  ?Allergies  ?Allergen Reactions  ? Mirtazapine Rash  ? ? ?(Not in a hospital admission) ? ? ?No results found for this or any previous visit (from the past 48 hour(s)). ?No results found. ? ?Review of Systems  ?Constitutional: Negative.   ?HENT: Negative.    ?Eyes: Negative.   ?Respiratory: Negative.    ?Cardiovascular: Negative.   ?Gastrointestinal: Negative.   ?Musculoskeletal: Negative.   ?Skin: Negative.   ?Neurological: Negative.   ?Psychiatric/Behavioral:  Positive for confusion, decreased concentration and dysphoric mood. Negative for agitation, behavioral problems and hallucinations.   ? ?Blood pressure 132/85, pulse 78, temperature 98.2 ?F (36.8 ?C), temperature source Tympanic, resp. rate 16, height 5\' 9"  (1.753 m), weight 81.6 kg, SpO2 98 %. ?Physical Exam ?Vitals and  nursing note reviewed.  ?Constitutional:   ?   Appearance: He is well-developed.  ?HENT:  ?   Head: Normocephalic and atraumatic.  ?Eyes:  ?   Conjunctiva/sclera: Conjunctivae normal.  ?   Pupils: Pupils are equal, round, and reactive to light.  ?Cardiovascular:  ?   Heart sounds: Normal heart sounds.  ?Pulmonary:  ?   Effort: Pulmonary effort is normal.  ?Abdominal:  ?   Palpations: Abdomen is soft.  ?Musculoskeletal:     ?   General: Normal range of motion.  ?   Cervical back: Normal range of motion.  ?Skin: ?   General: Skin is warm and dry.  ?Neurological:  ?   General: No focal deficit present.  ?   Mental Status: He is alert.  ?Psychiatric:     ?   Attention and Perception: He is inattentive.     ?   Mood and Affect: Affect is blunt.     ?   Speech: Speech is delayed.     ?   Behavior: Behavior is slowed.     ?   Thought Content: Thought content does not include suicidal ideation.     ?   Cognition and Memory: Memory is impaired.  ?  ? ?Assessment/Plan ?ECT treatment #3 today with plan to continue index course next week ? ? , MD ?08/01/2021, 5:34 PM ? ? ? ?

## 2021-08-01 NOTE — Anesthesia Postprocedure Evaluation (Signed)
Anesthesia Post Note ? ?Patient: Ricky [name] ? ?Procedure(s) Performed: ECT TX ? ?Patient location during evaluation: PACU ?Anesthesia Type: General ?Level of consciousness: awake and alert, oriented and patient cooperative ?Pain management: pain level controlled ?Vital Signs Assessment: post-procedure vital signs reviewed and stable ?Respiratory status: spontaneous breathing, nonlabored ventilation and respiratory function stable ?Cardiovascular status: blood pressure returned to baseline and stable ?Postop Assessment: adequate PO intake ?Anesthetic complications: no ? ? ?No notable events documented. ? ? ?Last Vitals:  ?Vitals:  ? 08/01/21 1052 08/01/21 1105  ?BP: (!) 167/85 137/83  ?Pulse: 85 78  ?Resp: 13 (!) 9  ?Temp: 37.1 ?C 36.8 ?C  ?SpO2: 99% 98%  ?  ?Last Pain:  ?Vitals:  ? 08/01/21 1105  ?TempSrc:   ?PainSc: 0-No pain  ? ? ?  ?  ?  ?  ?  ?  ? ?Darrin Nipper ? ? ? ? ?

## 2021-08-01 NOTE — Transfer of Care (Signed)
Immediate Anesthesia Transfer of Care Note ? ?Patient: Ricky [name] ? ?Procedure(s) Performed: ECT TX ? ?Patient Location: PACU ? ?Anesthesia Type:General ? ?Level of Consciousness: awake and alert  ? ?Airway & Oxygen Therapy: Patient Spontanous Breathing ? ?Post-op Assessment: Report given to RN and Post -op Vital signs reviewed and stable ? ?Post vital signs: Reviewed and stable ? ?Last Vitals:  ?Vitals Value Taken Time  ?BP 167/85 08/01/21 1052  ?Temp 37.1 ?C 08/01/21 1052  ?Pulse 90 08/01/21 1052  ?Resp 12 08/01/21 1052  ?SpO2 99 % 08/01/21 1052  ?Vitals shown include unvalidated device data. ? ?Last Pain:  ?Vitals:  ? 08/01/21 1033  ?TempSrc:   ?PainSc: 0-No pain  ?   ? ?  ? ?Complications: No notable events documented. ?

## 2021-08-04 ENCOUNTER — Encounter (HOSPITAL_BASED_OUTPATIENT_CLINIC_OR_DEPARTMENT_OTHER)
Admission: RE | Admit: 2021-08-04 | Discharge: 2021-08-04 | Disposition: A | Discharge status: Home / Self Care | Payer: 59 | Source: Ambulatory Visit | Attending: Psychiatry | Admitting: Psychiatry

## 2021-08-04 ENCOUNTER — Encounter: Payer: Self-pay | Admitting: Certified Registered"

## 2021-08-04 ENCOUNTER — Ambulatory Visit: Payer: 59 | Admitting: Psychiatry

## 2021-08-04 DIAGNOSIS — F332 Major depressive disorder, recurrent severe without psychotic features: Secondary | ICD-10-CM | POA: Diagnosis not present

## 2021-08-04 MED ORDER — SUCCINYLCHOLINE CHLORIDE 200 MG/10ML IV SOSY
PREFILLED_SYRINGE | INTRAVENOUS | Status: DC | PRN
  Administered 2021-08-04: 100 mg via INTRAVENOUS

## 2021-08-04 MED ORDER — SODIUM CHLORIDE 0.9 % IV SOLN: 500.0000 mL | Freq: Once | INTRAVENOUS | Status: DC

## 2021-08-04 MED ORDER — METHOHEXITAL SODIUM 100 MG/10ML IV SOSY
PREFILLED_SYRINGE | INTRAVENOUS | Status: DC | PRN
Start: 2021-08-04 — End: 2021-08-04
  Administered 2021-08-04: 80 mg via INTRAVENOUS

## 2021-08-04 MED ORDER — GLYCOPYRROLATE 0.2 MG/ML IJ SOLN: 0.1000 mg | Freq: Once | INTRAMUSCULAR | Status: DC

## 2021-08-04 MED ORDER — KETOROLAC TROMETHAMINE 30 MG/ML IJ SOLN: 30.0000 mg | Freq: Once | INTRAMUSCULAR | Status: DC

## 2021-08-04 NOTE — Anesthesia Procedure Notes (Signed)
Procedure Name: General with mask airway ?Date/Time: 08/04/2021 11:06 AM ?Performed by: Elmarie Mainland, CRNA ?Pre-anesthesia Checklist: Patient identified, Emergency Drugs available, Suction available and Patient being monitored ?Patient Re-evaluated:Patient Re-evaluated prior to induction ?Oxygen Delivery Method: Circle system utilized ?Preoxygenation: Pre-oxygenation with 100% oxygen ? ? ? ? ?

## 2021-08-04 NOTE — Anesthesia Preprocedure Evaluation (Signed)
Anesthesia Evaluation  ?Patient identified by MRN, date of birth, ID band ?Patient awake ? ? ? ?Reviewed: ?Allergy & Precautions, NPO status , Patient's Chart, lab work & pertinent test results ? ?History of Anesthesia Complications ?Negative for: history of anesthetic complications ? ?Airway ?Mallampati: IV ? ? ?Neck ROM: Full ? ? ? Dental ? ?(+) Edentulous Upper, Edentulous Lower ?  ?Pulmonary ?neg pulmonary ROS,  ?  ?Pulmonary exam normal ?breath sounds clear to auscultation ? ? ? ? ? ? Cardiovascular ?hypertension, Normal cardiovascular exam ?Rhythm:Regular Rate:Normal ? ?ECG 07/23/21: NSR, inferolateral T wave flattening ?  ?Neuro/Psych ?PSYCHIATRIC DISORDERS Anxiety Depression  Neuromuscular disease (Parkinson disease)   ? GI/Hepatic ?negative GI ROS,   ?Endo/Other  ?diabetes, Type 2 ? Renal/GU ?negative Renal ROS  ? ?  ?Musculoskeletal ? ?(+) Arthritis ,  ? Abdominal ?  ?Peds ? Hematology ?negative hematology ROS ?(+)   ?Anesthesia Other Findings ? ? Reproductive/Obstetrics ? ?  ? ? ? ? ? ? ? ? ? ? ? ? ? ?  ?  ? ? ? ? ? ? ? ? ?Anesthesia Physical ? ?Anesthesia Plan ? ?ASA: 2 ? ?Anesthesia Plan: General  ? ?Post-op Pain Management:   ? ?Induction: Intravenous ? ?PONV Risk Score and Plan: 2 and TIVA and Treatment may vary due to age or medical condition ? ?Airway Management Planned: Natural Airway ? ?Additional Equipment:  ? ?Intra-op Plan:  ? ?Post-operative Plan:  ? ?Informed Consent: I have reviewed the patients History and Physical, chart, labs and discussed the procedure including the risks, benefits and alternatives for the proposed anesthesia with the patient or authorized representative who has indicated his/her understanding and acceptance.  ? ? ? ? ? ?Plan Discussed with: CRNA ? ?Anesthesia Plan Comments: (LMA/GETA backup discussed.  Serial consent on chart.  Patient consented for risks of anesthesia including but not limited to:  ?- adverse reactions to medications ?-  damage to eyes, teeth, lips or other oral mucosa ?- nerve damage due to positioning  ?- sore throat or hoarseness ?- damage to heart, brain, nerves, lungs, other parts of body or loss of life ? ?Informed patient about role of CRNA in peri- and intra-operative care.  Patient voiced understanding.)  ? ? ? ? ? ? ?Anesthesia Quick Evaluation ? ?

## 2021-08-04 NOTE — Anesthesia Postprocedure Evaluation (Signed)
Anesthesia Post Note ? ?Patient: Ricky [name] ? ?Procedure(s) Performed: ECT TX ? ?Patient location during evaluation: PACU ?Anesthesia Type: General ?Level of consciousness: awake and alert ?Pain management: pain level controlled ?Vital Signs Assessment: post-procedure vital signs reviewed and stable ?Respiratory status: spontaneous breathing, nonlabored ventilation, respiratory function stable and patient connected to nasal cannula oxygen ?Cardiovascular status: blood pressure returned to baseline and stable ?Postop Assessment: no apparent nausea or vomiting ?Anesthetic complications: no ? ? ?No notable events documented. ? ? ?Last Vitals:  ?Vitals:  ? 08/04/21 1140 08/04/21 1150  ?BP: 132/82 134/77  ?Pulse: 78 64  ?Resp: 10 11  ?Temp:  (!) 36.2 ?C  ?SpO2: 100% 100%  ?  ?Last Pain:  ?Vitals:  ? 08/04/21 1150  ?TempSrc:   ?PainSc: 0-No pain  ? ? ?  ?  ?  ?  ?  ?  ? ?Martha Clan ? ? ? ? ?

## 2021-08-04 NOTE — Transfer of Care (Signed)
Immediate Anesthesia Transfer of Care Note ? ?Patient: Ricky [name] ? ?Procedure(s) Performed: ECT TX ? ?Patient Location: PACU ? ?Anesthesia Type:General ? ?Level of Consciousness: drowsy and patient cooperative ? ?Airway & Oxygen Therapy: Patient Spontanous Breathing and Patient connected to face mask oxygen ? ?Post-op Assessment: Report given to RN and Post -op Vital signs reviewed and stable ? ?Post vital signs: Reviewed and stable ? ?Last Vitals:  ?Vitals Value Taken Time  ?BP 159/88 08/04/21 1121  ?Temp 36.4 ?C 08/04/21 1122  ?Pulse 73 08/04/21 1122  ?Resp 14 08/04/21 1122  ?SpO2 100 % 08/04/21 1122  ?Vitals shown include unvalidated device data. ? ?Last Pain:  ?Vitals:  ? 08/04/21 1047  ?TempSrc: Oral  ?   ? ?  ? ?Complications: No notable events documented. ?

## 2021-08-04 NOTE — Procedures (Signed)
ECT SERVICES ?Physician?s Interval Evaluation & Treatment Note ? ?Patient Identification: Ricky [name] ?MRN:  [id number] ?Date of Evaluation:  08/04/2021 ?TX #: 4 ? ?MADRS:  ? ?MMSE:  ? ?P.E. Findings: ? ?No change to physical exam ? ?Psychiatric Interval Note: ? ?Patient reports mood is a little better wife is even more positive ? ?Subjective: ? ?Patient is a 66 y.o. male seen for evaluation for Electroconvulsive Therapy. ?Feeling less depressed ? ?Treatment Summary: ? ? [x]   Right Unilateral             []  Bilateral ?  ?% Energy : 0.3 ms 100% ? ? Impedance: 1370 ohms ? ?Seizure Energy Index: 3281 ?V squared ? ?Postictal Suppression Index: 69% ? ?Seizure Concordance Index: 79% ? ?Medications ? ?Pre Shock: Toradol 30 mg Brevital 80 mg succinylcholine 100 mg ? ?Post Shock:   ? ?Seizure Duration: 10 seconds EMG 15 seconds EEG ? ? ?Comments: ?Patient needs to switch to ketamine with next treatment ? ?Lungs: ? ?[x]   Clear to auscultation              ? ?[]  Other:  ? ?Heart: ?  ? [x]   Regular rhythm             []  irregular rhythm ? ? ? [x]   Previous H&P reviewed, patient examined and there are NO CHANGES              ?  ? []   Previous H&P reviewed, patient examined and there are changes noted. ? ? ? , MD ?3/27/20234:06 PM ? ? ? ?

## 2021-08-05 ENCOUNTER — Other Ambulatory Visit: Payer: Self-pay | Admitting: Psychiatry

## 2021-08-06 ENCOUNTER — Encounter: Payer: Self-pay | Admitting: Anesthesiology

## 2021-08-06 ENCOUNTER — Other Ambulatory Visit: Payer: Self-pay | Admitting: Psychiatry

## 2021-08-06 ENCOUNTER — Encounter
Admission: RE | Admit: 2021-08-06 | Discharge: 2021-08-06 | Disposition: A | Discharge status: Home / Self Care | Payer: 59 | Source: Ambulatory Visit | Attending: Psychiatry | Admitting: Psychiatry

## 2021-08-06 ENCOUNTER — Other Ambulatory Visit: Payer: Self-pay

## 2021-08-06 DIAGNOSIS — F332 Major depressive disorder, recurrent severe without psychotic features: Secondary | ICD-10-CM | POA: Diagnosis not present

## 2021-08-06 MED ORDER — ONDANSETRON HCL 4 MG/2ML IJ SOLN: 4.0000 mg | Freq: Once | INTRAMUSCULAR | Status: DC

## 2021-08-06 MED ORDER — SODIUM CHLORIDE 0.9 % IV SOLN: 500.0000 mL | Freq: Once | INTRAVENOUS | Status: DC

## 2021-08-06 MED ORDER — LABETALOL HCL 5 MG/ML IV SOLN
INTRAVENOUS | Status: AC
  Filled 2021-08-06: qty 4

## 2021-08-06 NOTE — Consult Note (Signed)
?  Patient was on time and prepared for ECT today however anesthesia prefer that cardiology consult before we proceeded with treatment given the patient's episode of reported ST elevation.  Cardiology consultation was obtained but not before patient was advised to go home as we did not know when it would be completed.  We canceled treatment today but will be picking up again on Friday ?

## 2021-08-07 ENCOUNTER — Other Ambulatory Visit: Payer: Self-pay | Admitting: Psychiatry

## 2021-08-08 ENCOUNTER — Encounter: Payer: Self-pay | Admitting: Anesthesiology

## 2021-08-08 ENCOUNTER — Ambulatory Visit: Payer: Self-pay | Admitting: Anesthesiology

## 2021-08-08 ENCOUNTER — Encounter (HOSPITAL_BASED_OUTPATIENT_CLINIC_OR_DEPARTMENT_OTHER)
Admission: RE | Admit: 2021-08-08 | Discharge: 2021-08-08 | Disposition: A | Discharge status: Home / Self Care | Payer: 59 | Source: Ambulatory Visit | Attending: Psychiatry | Admitting: Psychiatry

## 2021-08-08 DIAGNOSIS — F332 Major depressive disorder, recurrent severe without psychotic features: Secondary | ICD-10-CM | POA: Diagnosis not present

## 2021-08-08 LAB — GLUCOSE, CAPILLARY: Glucose-Capillary: 108 mg/dL — ABNORMAL HIGH (ref 70–99)

## 2021-08-08 MED ORDER — KETAMINE HCL 10 MG/ML IJ SOLN
INTRAMUSCULAR | Status: DC | PRN
Start: 2021-08-08 — End: 2021-08-08
  Administered 2021-08-08: 100 mg via INTRAVENOUS

## 2021-08-08 MED ORDER — SODIUM CHLORIDE 0.9 % IV SOLN: 500.0000 mL | Freq: Once | INTRAVENOUS | Status: AC

## 2021-08-08 MED ORDER — KETAMINE HCL 50 MG/5ML IJ SOSY
PREFILLED_SYRINGE | INTRAMUSCULAR | Status: AC
  Filled 2021-08-08: qty 5

## 2021-08-08 MED ORDER — GLYCOPYRROLATE 0.2 MG/ML IJ SOLN
INTRAMUSCULAR | Status: AC
Start: 2021-08-08 — End: 2021-08-08
  Filled 2021-08-08: qty 1

## 2021-08-08 MED ORDER — KETOROLAC TROMETHAMINE 30 MG/ML IJ SOLN
INTRAMUSCULAR | Status: AC
Start: 2021-08-08 — End: 2021-08-08
  Administered 2021-08-08: 30 mg via INTRAVENOUS
  Filled 2021-08-08: qty 1

## 2021-08-08 MED ORDER — ONDANSETRON HCL 4 MG/2ML IJ SOLN: 4.0000 mg | Freq: Once | INTRAMUSCULAR | Status: DC | PRN

## 2021-08-08 MED ORDER — KETOROLAC TROMETHAMINE 30 MG/ML IJ SOLN: 30.0000 mg | Freq: Once | INTRAMUSCULAR | Status: AC

## 2021-08-08 MED ORDER — SUCCINYLCHOLINE CHLORIDE 200 MG/10ML IV SOSY
PREFILLED_SYRINGE | INTRAVENOUS | Status: DC | PRN
Start: 2021-08-08 — End: 2021-08-08
  Administered 2021-08-08: 100 mg via INTRAVENOUS

## 2021-08-08 MED ORDER — GLYCOPYRROLATE 0.2 MG/ML IJ SOLN
0.1000 mg | Freq: Once | INTRAMUSCULAR | Status: AC
  Administered 2021-08-08: 0.1 mg via INTRAVENOUS

## 2021-08-08 MED ORDER — SUCCINYLCHOLINE CHLORIDE 200 MG/10ML IV SOSY
PREFILLED_SYRINGE | INTRAVENOUS | Status: AC
  Filled 2021-08-08: qty 10

## 2021-08-08 NOTE — Transfer of Care (Signed)
Immediate Anesthesia Transfer of Care Note ? ?Patient: Ricky [name] ? ?Procedure(s) Performed: ECT TX ? ?Patient Location: PACU ? ?Anesthesia Type:General ? ?Level of Consciousness: drowsy and patient cooperative ? ?Airway & Oxygen Therapy: Patient Spontanous Breathing ? ?Post-op Assessment: Report given to RN and Post -op Vital signs reviewed and stable ? ?Post vital signs: Reviewed and stable ? ?Last Vitals:  ?Vitals Value Taken Time  ?BP 159/90 08/08/21 1324  ?Temp    ?Pulse 74 08/08/21 1324  ?Resp 11 08/08/21 1324  ?SpO2 98 % 08/08/21 1324  ?Vitals shown include unvalidated device data. ? ?Last Pain:  ?Vitals:  ? 08/08/21 1052  ?TempSrc:   ?PainSc: 0-No pain  ?   ? ?  ? ?Complications: No notable events documented. ?

## 2021-08-08 NOTE — H&P (Signed)
Ricky [name] is an 66 y.o. male.   ?Chief Complaint: anxiety ?HPI: recurrent anxiety ? ?Past Medical History:  ?Diagnosis Date  ? Anxiety   ? Arthritis   ? Carpal tunnel syndrome   ? Depression   ? Diabetes (HCC)   ? Hyperlipidemia   ? Hypertension   ? Loss of teeth due to extraction   ? Parkinson's disease (HCC)   ? ? ?Past Surgical History:  ?Procedure Laterality Date  ? CARPAL TUNNEL RELEASE Right 11/28/2020  ? Procedure: CARPAL TUNNEL RELEASE ENDOSCOPIC;  Surgeon: Christena Flake, MD;  Location: [facility] OR[facility];  Service: Orthopedics;  Laterality: Right;  ? CARPAL TUNNEL RELEASE Left 01/22/2021  ? Procedure: CARPAL TUNNEL RELEASE ENDOSCOPIC;  Surgeon: Christena Flake, MD;  Location: [facility] OR[facility];  Service: Orthopedics;  Laterality: Left;  ? KNEE ARTHROSCOPY Right   ? ? ?Family History  ?Problem Relation Age of Onset  ? Depression Sister   ? Depression Brother   ? ?Social History:  reports that he has never smoked. He has never used smokeless tobacco. He reports that he does not drink alcohol and does not use drugs. ? ?Allergies:  ?Allergies  ?Allergen Reactions  ? Mirtazapine Rash  ? ? ?(Not in a hospital admission) ? ? ?No results found for this or any previous visit (from the past 48 hour(s)). ?No results found. ? ?Review of Systems  ?Constitutional: Negative.   ?HENT: Negative.    ?Eyes: Negative.   ?Respiratory: Negative.    ?Cardiovascular: Negative.   ?Gastrointestinal: Negative.   ?Musculoskeletal: Negative.   ?Skin: Negative.   ?Neurological: Negative.   ?Psychiatric/Behavioral:  The patient is nervous/anxious.   ?All other systems reviewed and are negative. ? ?Blood pressure 115/78, pulse 75, temperature 98.5 ?F (36.9 ?C), temperature source Oral, resp. rate 16, height 5\' 9"  (1.753 m), weight 81.6 kg, SpO2 98 %. ?Physical Exam ?Vitals and nursing note reviewed.  ?Constitutional:   ?   Appearance: He is well-developed.  ?HENT:  ?   Head: Normocephalic and atraumatic.  ?Eyes:  ?   Conjunctiva/sclera: Conjunctivae normal.   ?   Pupils: Pupils are equal, round, and reactive to light.  ?Cardiovascular:  ?   Heart sounds: Normal heart sounds.  ?Pulmonary:  ?   Effort: Pulmonary effort is normal.  ?Abdominal:  ?   Palpations: Abdomen is soft.  ?Musculoskeletal:     ?   General: Normal range of motion.  ?   Cervical back: Normal range of motion.  ?Skin: ?   General: Skin is warm and dry.  ?Neurological:  ?   General: No focal deficit present.  ?   Mental Status: He is alert.  ?Psychiatric:     ?   Mood and Affect: Mood normal.     ?   Behavior: Behavior normal.     ?   Thought Content: Thought content normal.  ?  ? ?Assessment/Plan ?Continue index for now ? ? , MD ?08/08/2021, 12:10 PM ? ? ? ?

## 2021-08-08 NOTE — Anesthesia Preprocedure Evaluation (Signed)
Anesthesia Evaluation  ?Patient identified by MRN, date of birth, ID band ?Patient awake ? ? ? ?Reviewed: ?Allergy & Precautions, NPO status , Patient's Chart, lab work & pertinent test results ? ?History of Anesthesia Complications ?Negative for: history of anesthetic complications ? ?Airway ?Mallampati: IV ? ? ?Neck ROM: Full ? ? ? Dental ? ?(+) Edentulous Upper, Edentulous Lower ?  ?Pulmonary ?neg pulmonary ROS,  ?  ?Pulmonary exam normal ?breath sounds clear to auscultation ? ? ? ? ? ? Cardiovascular ?hypertension, Normal cardiovascular exam ?Rhythm:Regular Rate:Normal ? ?ECG 07/23/21: NSR, inferolateral T wave flattening ?  ?Neuro/Psych ?PSYCHIATRIC DISORDERS Anxiety Depression  Neuromuscular disease (Parkinson disease)   ? GI/Hepatic ?negative GI ROS,   ?Endo/Other  ?diabetes, Type 2 ? Renal/GU ?negative Renal ROS  ? ?  ?Musculoskeletal ? ?(+) Arthritis ,  ? Abdominal ?  ?Peds ? Hematology ?negative hematology ROS ?(+)   ?Anesthesia Other Findings ?Past Medical History: ?No date: Anxiety ?No date: Arthritis ?No date: Carpal tunnel syndrome ?No date: Depression ?No date: Diabetes [facility]) ?No date: Hyperlipidemia ?No date: Hypertension ?No date: Loss of teeth due to extraction ?No date: Parkinson's disease ([place]) ? ? Reproductive/Obstetrics ? ?  ? ? ? ? ? ? ? ? ? ? ? ? ? ?  ?  ? ? ? ? ? ? ? ? ?Anesthesia Physical ? ?Anesthesia Plan ? ?ASA: 2 ? ?Anesthesia Plan: General  ? ?Post-op Pain Management:   ? ?Induction: Intravenous ? ?PONV Risk Score and Plan: 2 and TIVA and Treatment may vary due to age or medical condition ? ?Airway Management Planned: Natural Airway ? ?Additional Equipment: None ? ?Intra-op Plan:  ? ?Post-operative Plan:  ? ?Informed Consent: I have reviewed the patients History and Physical, chart, labs and discussed the procedure including the risks, benefits and alternatives for the proposed anesthesia with the patient or authorized representative who has indicated  his/her understanding and acceptance.  ? ? ? ?Dental advisory given ? ?Plan Discussed with: CRNA ? ?Anesthesia Plan Comments: (LMA/GETA backup discussed.  Serial consent on chart.  Patient consented for risks of anesthesia including but not limited to:  ?- adverse reactions to medications ?- damage to eyes, teeth, lips or other oral mucosa ?- nerve damage due to positioning  ?- sore throat or hoarseness ?- damage to heart, brain, nerves, lungs, other parts of body or loss of life ? ?Informed patient about role of CRNA in peri- and intra-operative care.  Patient voiced understanding.)  ? ? ? ? ? ? ?Anesthesia Quick Evaluation ? ?

## 2021-08-08 NOTE — Anesthesia Postprocedure Evaluation (Signed)
Anesthesia Post Note ? ?Patient: Musab Wingard ? ?Procedure(s) Performed: ECT TX ? ?Patient location during evaluation: PACU ?Anesthesia Type: General ?Level of consciousness: awake and alert, oriented and patient cooperative ?Pain management: pain level controlled ?Vital Signs Assessment: post-procedure vital signs reviewed and stable ?Respiratory status: spontaneous breathing, nonlabored ventilation and respiratory function stable ?Cardiovascular status: blood pressure returned to baseline and stable ?Postop Assessment: adequate PO intake ?Anesthetic complications: no ? ? ?No notable events documented. ? ? ?Last Vitals:  ?Vitals:  ? 08/08/21 1350 08/08/21 1400  ?BP: 134/83 135/83  ?Pulse: 68 68  ?Resp: 10 10  ?Temp:  37.1 ?C  ?SpO2: 98% 97%  ?  ?Last Pain:  ?Vitals:  ? 08/08/21 1400  ?TempSrc:   ?PainSc: 0-No pain  ? ? ?  ?  ?  ?  ?  ?  ? ?Reed Breech ? ? ? ? ?

## 2021-08-08 NOTE — Procedures (Signed)
ECT SERVICES ?Physician?s Interval Evaluation & Treatment Note ? ?Patient Identification: Ricky [name] ?MRN:  [id number] ?Date of Evaluation:  08/08/2021 ?TX #: 5 ? ?MADRS:  ? ?MMSE:  ? ?P.E. Findings: ? ?No real change to physical exam ? ?Psychiatric Interval Note: ? ?He missed treatment on Wednesday because of concern about cardiac issues but that was eventually resolved when cardiology felt it was safe to continue after reviewing the situation.  He does not feel much different and made it clear to me that he was still primarily complaining of anxiety ? ?Subjective: ? ?Patient is a 66 y.o. male seen for evaluation for Electroconvulsive Therapy. ?Anxious and in fact he admitted to me he had taken clonazepam on the way in today ? ?Treatment Summary: ? ? [x]   Right Unilateral             []  Bilateral ?  ?% Energy : 0.3 ms 100% ? ? Impedance: 690 ohms ? ?Seizure Energy Index: 10,921 ?V squared ? ?Postictal Suppression Index: 82% ? ?Seizure Concordance Index: 95% ? ?Medications ? ?Pre Shock: Toradol 30 mg ketamine 100 mg succinylcholine 100 mg ? ?Post Shock:   ? ?Seizure Duration: 18 seconds EMG 22 seconds EEG ? ? ?Comments: ?Just barely adequate seizure even with ketamine probably because he is taking the Klonopin on the way in.  I am starting to wonder if he is primarily anxiety but wife has stated she sees some improvement and for that reason I recommend at least going until Monday with another treatment ? ?Lungs: ? ?[x]   Clear to auscultation              ? ?[]  Other:  ? ?Heart: ?  ? [x]   Regular rhythm             []  irregular rhythm ? ? ? [x]   Previous H&P reviewed, patient examined and there are NO CHANGES              ?  ? []   Previous H&P reviewed, patient examined and there are changes noted. ? ? ? , MD ?3/31/20235:09 PM ? ?  ?

## 2021-08-11 ENCOUNTER — Other Ambulatory Visit: Payer: Self-pay | Admitting: Psychiatry

## 2021-08-11 ENCOUNTER — Encounter
Admission: RE | Admit: 2021-08-11 | Discharge: 2021-08-11 | Disposition: A | Discharge status: Home / Self Care | Payer: 59 | Source: Ambulatory Visit | Attending: Psychiatry | Admitting: Psychiatry

## 2021-08-11 ENCOUNTER — Encounter: Payer: Self-pay | Admitting: Anesthesiology

## 2021-08-11 ENCOUNTER — Ambulatory Visit: Payer: Self-pay | Admitting: Anesthesiology

## 2021-08-11 DIAGNOSIS — F339 Major depressive disorder, recurrent, unspecified: Secondary | ICD-10-CM | POA: Diagnosis not present

## 2021-08-11 DIAGNOSIS — F419 Anxiety disorder, unspecified: Secondary | ICD-10-CM | POA: Diagnosis present

## 2021-08-11 DIAGNOSIS — Z818 Family history of other mental and behavioral disorders: Secondary | ICD-10-CM | POA: Insufficient documentation

## 2021-08-11 DIAGNOSIS — F332 Major depressive disorder, recurrent severe without psychotic features: Secondary | ICD-10-CM

## 2021-08-11 LAB — GLUCOSE, CAPILLARY
Glucose-Capillary: 121 mg/dL — ABNORMAL HIGH (ref 70–99)
Glucose-Capillary: 115 mg/dL — ABNORMAL HIGH (ref 70–99)

## 2021-08-11 MED ORDER — GLYCOPYRROLATE 0.2 MG/ML IJ SOLN
INTRAMUSCULAR | Status: AC
  Administered 2021-08-11: 0.2 mg via INTRAVENOUS
  Filled 2021-08-11: qty 1

## 2021-08-11 MED ORDER — GLYCOPYRROLATE 0.2 MG/ML IJ SOLN: 0.1000 mg | Freq: Once | INTRAMUSCULAR | Status: AC

## 2021-08-11 MED ORDER — KETAMINE HCL 50 MG/5ML IJ SOSY
PREFILLED_SYRINGE | INTRAMUSCULAR | Status: AC
  Filled 2021-08-11: qty 5

## 2021-08-11 MED ORDER — KETOROLAC TROMETHAMINE 30 MG/ML IJ SOLN
INTRAMUSCULAR | Status: AC
  Filled 2021-08-11: qty 1

## 2021-08-11 MED ORDER — LABETALOL HCL 5 MG/ML IV SOLN
INTRAVENOUS | Status: DC | PRN
  Administered 2021-08-11 (×2): 5 mg via INTRAVENOUS

## 2021-08-11 MED ORDER — FENTANYL CITRATE (PF) 100 MCG/2ML IJ SOLN: 25.0000 ug | INTRAMUSCULAR | Status: DC | PRN

## 2021-08-11 MED ORDER — ONDANSETRON HCL 4 MG/2ML IJ SOLN: 4.0000 mg | Freq: Once | INTRAMUSCULAR | Status: DC | PRN

## 2021-08-11 MED ORDER — SODIUM CHLORIDE 0.9 % IV SOLN: 500.0000 mL | Freq: Once | INTRAVENOUS | Status: AC

## 2021-08-11 MED ORDER — KETOROLAC TROMETHAMINE 30 MG/ML IJ SOLN
30.0000 mg | Freq: Once | INTRAMUSCULAR | Status: AC
  Administered 2021-08-11: 30 mg via INTRAVENOUS

## 2021-08-11 MED ORDER — KETAMINE HCL 10 MG/ML IJ SOLN
INTRAMUSCULAR | Status: DC | PRN
  Administered 2021-08-11: 100 mg via INTRAVENOUS

## 2021-08-11 MED ORDER — SUCCINYLCHOLINE CHLORIDE 200 MG/10ML IV SOSY
PREFILLED_SYRINGE | INTRAVENOUS | Status: DC | PRN
  Administered 2021-08-11: 100 mg via INTRAVENOUS

## 2021-08-11 NOTE — Anesthesia Preprocedure Evaluation (Signed)
Anesthesia Evaluation  ?Patient identified by MRN, date of birth, ID band ?Patient awake ? ? ? ?Reviewed: ?Allergy & Precautions, H&P , NPO status , Patient's Chart, lab work & pertinent test results, reviewed documented beta blocker date and time  ? ?Airway ?Mallampati: II ? ? ?Neck ROM: full ? ? ? Dental ? ?(+) Poor Dentition ?  ?Pulmonary ?neg pulmonary ROS,  ?  ?Pulmonary exam normal ? ? ? ? ? ? ? Cardiovascular ?Exercise Tolerance: Good ?hypertension, On Medications ?negative cardio ROS ?Normal cardiovascular exam ?Rhythm:regular Rate:Normal ? ? ?  ?Neuro/Psych ?PSYCHIATRIC DISORDERS Anxiety Depression  Neuromuscular disease   ? GI/Hepatic ?negative GI ROS, Neg liver ROS,   ?Endo/Other  ?negative endocrine ROSdiabetes, Well Controlled, Type 2 ? Renal/GU ?negative Renal ROS  ?negative genitourinary ?  ?Musculoskeletal ? ? Abdominal ?  ?Peds ? Hematology ?negative hematology ROS ?(+)   ?Anesthesia Other Findings ?Past Medical History: ?No date: Anxiety ?No date: Arthritis ?No date: Carpal tunnel syndrome ?No date: Depression ?No date: Diabetes [facility]) ?No date: Hyperlipidemia ?No date: Hypertension ?No date: Loss of teeth due to extraction ?No date: Parkinson's disease (HCC) ?Past Surgical History: ?11/28/2020: CARPAL TUNNEL RELEASE; Right ?    Comment:  Procedure: CARPAL TUNNEL RELEASE ENDOSCOPIC;  Surgeon:  ?             Christena Flake, MD;  Location: [facility] ORS;  Service:  ?             Orthopedics;  Laterality: Right; ?01/22/2021: CARPAL TUNNEL RELEASE; Left ?    Comment:  Procedure: CARPAL TUNNEL RELEASE ENDOSCOPIC;  Surgeon:  ?             Christena Flake, MD;  Location: [facility] ORS;  Service:  ?             Orthopedics;  Laterality: Left; ?No date: KNEE ARTHROSCOPY; Right ?BMI   ? Body Mass Index: 26.58 kg/m?  ?  ? Reproductive/Obstetrics ?negative OB ROS ? ?  ? ? ? ? ? ? ? ? ? ? ? ? ? ?  ?  ? ? ? ? ? ? ? ? ?Anesthesia Physical ?Anesthesia Plan ? ?ASA: 3 ? ?Anesthesia Plan: General   ? ?Post-op Pain Management:   ? ?Induction:  ? ?PONV Risk Score and Plan:  ? ?Airway Management Planned:  ? ?Additional Equipment:  ? ?Intra-op Plan:  ? ?Post-operative Plan:  ? ?Informed Consent: I have reviewed the patients History and Physical, chart, labs and discussed the procedure including the risks, benefits and alternatives for the proposed anesthesia with the patient or authorized representative who has indicated his/her understanding and acceptance.  ? ? ? ?Dental Advisory Given ? ?Plan Discussed with: CRNA ? ?Anesthesia Plan Comments:   ? ? ? ? ? ? ?Anesthesia Quick Evaluation ? ?

## 2021-08-11 NOTE — Procedures (Signed)
ECT SERVICES ?Physician?s Interval Evaluation & Treatment Note ? ?Patient Identification: Ricky [name] ?MRN:  [id number] ?Date of Evaluation:  08/11/2021 ?[place] #: [id number] ? ?MADRS:  ? ?MMSE:  ? ?P.E. Findings: ? ?No change to physical exam ? ?Psychiatric Interval Note: ? ?Still having some memory impairment but feels that his mood might be slightly better ? ?Subjective: ? ?Patient is a 66 y.o. male seen for evaluation for Electroconvulsive Therapy. ?Some improvement ? ?Treatment Summary: ? ? [x]   Right Unilateral             []  Bilateral ?  ?% Energy : 0.3 ms 100% ? ? Impedance: 1560 ohms ? ?Seizure Energy Index: [id number] ?V squared ? ?Postictal Suppression Index: 83% ? ?Seizure Concordance Index: 74% ? ?Medications ? ?Pre Shock: Toradol 30 mg ketamine 100 mg succinylcholine 100 mg ? ?Post Shock:   ? ?Seizure Duration: EMG 12 seconds EEG 13 seconds ? ? ?Comments: ?Unfortunately despite switching anesthetics and having the machine turned all the way up we are not able to get an adequate seizure today.  At this point it seems not worth it to continue doing index course given the low chance of improvement.  Explained the situation to patient we will discontinue ECT and recommend continued outpatient treatment by his primary psychiatrist ? ?Lungs: ? ?[x]   Clear to auscultation              ? ?[]  Other:  ? ?Heart: ?  ? [x]   Regular rhythm             []  irregular rhythm ? ? ? [x]   Previous H&P reviewed, patient examined and there are NO CHANGES              ?  ? []   Previous H&P reviewed, patient examined and there are changes noted. ? ? ?Alethia Berthold, MD ?4/3/20235:13 PM ? ?  ?

## 2021-08-11 NOTE — H&P (Signed)
Ricky [name] is an 66 y.o. male.   ?Chief Complaint: Patient thinks maybe he is feeling slightly better.  Anxiety is still dominant. ?HPI: History of chronic recurrent depression and anxiety ? ?Past Medical History:  ?Diagnosis Date  ? Anxiety   ? Arthritis   ? Carpal tunnel syndrome   ? Depression   ? Diabetes (HCC)   ? Hyperlipidemia   ? Hypertension   ? Loss of teeth due to extraction   ? Parkinson's disease (HCC)   ? ? ?Past Surgical History:  ?Procedure Laterality Date  ? CARPAL TUNNEL RELEASE Right 11/28/2020  ? Procedure: CARPAL TUNNEL RELEASE ENDOSCOPIC;  Surgeon: Christena Flake, MD;  Location: [facility];  Service: Orthopedics;  Laterality: Right;  ? CARPAL TUNNEL RELEASE Left 01/22/2021  ? Procedure: CARPAL TUNNEL RELEASE ENDOSCOPIC;  Surgeon: Christena Flake, MD;  Location: [facility] OR[facility];  Service: Orthopedics;  Laterality: Left;  ? KNEE ARTHROSCOPY Right   ? ? ?Family History  ?Problem Relation Age of Onset  ? Depression Sister   ? Depression Brother   ? ?Social History:  reports that he has never smoked. He has never used smokeless tobacco. He reports that he does not drink alcohol and does not use drugs. ? ?Allergies:  ?Allergies  ?Allergen Reactions  ? Mirtazapine Rash  ? ? ?(Not in a hospital admission) ? ? ?Results for orders placed or performed during the hospital encounter of 08/11/21 (from the past 48 hour(s))  ?Glucose, capillary     Status: Abnormal  ? Collection Time: 08/11/21 11:03 AM  ?Result Value Ref Range  ? Glucose-Capillary 121 (H) 70 - 99 mg/dL  ?  Comment: Glucose reference range applies only to samples taken after fasting for at least 8 hours.  ?Glucose, capillary     Status: Abnormal  ? Collection Time: 08/11/21  1:06 PM  ?Result Value Ref Range  ? Glucose-Capillary 115 (H) 70 - 99 mg/dL  ?  Comment: Glucose reference range applies only to samples taken after fasting for at least 8 hours.  ? ?No results found. ? ?Review of Systems  ?Constitutional: Negative.   ?HENT: Negative.    ?Eyes:  Negative.   ?Respiratory: Negative.    ?Cardiovascular: Negative.   ?Gastrointestinal: Negative.   ?Musculoskeletal: Negative.   ?Skin: Negative.   ?Neurological: Negative.   ?Psychiatric/Behavioral:  Negative for agitation, behavioral problems and hallucinations. The patient is nervous/anxious.   ? ?Blood pressure 138/80, pulse 60, temperature 98.2 ?F (36.8 ?C), temperature source Tympanic, resp. rate 12, height 5\' 9"  (1.753 m), weight 81.6 kg, SpO2 97 %. ?Physical Exam ?Vitals and nursing note reviewed.  ?Constitutional:   ?   Appearance: He is well-developed.  ?HENT:  ?   Head: Normocephalic and atraumatic.  ?Eyes:  ?   Conjunctiva/sclera: Conjunctivae normal.  ?   Pupils: Pupils are equal, round, and reactive to light.  ?Cardiovascular:  ?   Heart sounds: Normal heart sounds.  ?Pulmonary:  ?   Effort: Pulmonary effort is normal.  ?Abdominal:  ?   Palpations: Abdomen is soft.  ?Musculoskeletal:     ?   General: Normal range of motion.  ?   Cervical back: Normal range of motion.  ?Skin: ?   General: Skin is warm and dry.  ?Neurological:  ?   General: No focal deficit present.  ?   Mental Status: He is alert.  ?Psychiatric:     ?   Attention and Perception: Attention normal.     ?   Mood  and Affect: Mood is anxious.     ?   Speech: Speech normal.     ?   Behavior: Behavior is cooperative.     ?   Thought Content: Thought content normal.     ?   Cognition and Memory: Memory is impaired.     ?   Judgment: Judgment normal.  ?  ? ?Assessment/Plan ?We are trying to continue ECT but struggling to have adequate length seizures.  Treat again today with ketamine to see if we can continue this course. ? ?Mordecai Rasmussen, MD ?08/11/2021, 5:11 PM ? ? ? ?

## 2021-08-11 NOTE — Transfer of Care (Signed)
Immediate Anesthesia Transfer of Care Note ? ?Patient: Ricky [name] ? ?Procedure(s) Performed: ECT TX ? ?Patient Location: PACU ? ?Anesthesia Type:General ? ?Level of Consciousness: drowsy ? ?Airway & Oxygen Therapy: Patient Spontanous Breathing ? ?Post-op Assessment: Report given to RN and Post -op Vital signs reviewed and stable ? ?Post vital signs: Reviewed and stable ? ?Last Vitals:  ?Vitals Value Taken Time  ?BP 184/104 08/11/21 1256  ?Temp    ?Pulse 87 08/11/21 1258  ?Resp 17 08/11/21 1258  ?SpO2 93 % 08/11/21 1258  ?Vitals shown include unvalidated device data. ? ?Last Pain:  ?Vitals:  ? 08/11/21 1031  ?TempSrc: Tympanic  ?PainSc: 0-No pain  ?   ? ?  ? ?Complications: No notable events documented. ?

## 2021-08-12 NOTE — Anesthesia Postprocedure Evaluation (Signed)
Anesthesia Post Note ? ?Patient: Ricky [name] ? ?Procedure(s) Performed: ECT TX ? ?Patient location during evaluation: PACU ?Anesthesia Type: General ?Level of consciousness: awake and alert ?Pain management: pain level controlled ?Vital Signs Assessment: post-procedure vital signs reviewed and stable ?Respiratory status: spontaneous breathing, nonlabored ventilation, respiratory function stable and patient connected to nasal cannula oxygen ?Cardiovascular status: blood pressure returned to baseline and stable ?Postop Assessment: no apparent nausea or vomiting ?Anesthetic complications: no ? ? ?No notable events documented. ? ? ?Last Vitals:  ?Vitals:  ? 08/11/21 1330 08/11/21 1425  ?BP: (!) 141/83 138/80  ?Pulse: 60 60  ?Resp: 10 12  ?Temp:  36.8 ?C  ?SpO2: 97%   ?  ?Last Pain:  ?Vitals:  ? 08/11/21 1425  ?TempSrc: Tympanic  ?PainSc: 0-No pain  ? ? ?  ?  ?  ?  ?  ?  ? ?Yevette Edwards ? ? ? ? ?

## 2021-08-18 ENCOUNTER — Telehealth (INDEPENDENT_AMBULATORY_CARE_PROVIDER_SITE_OTHER): Payer: 59 | Admitting: Psychiatry

## 2021-08-18 ENCOUNTER — Telehealth: Payer: Self-pay

## 2021-08-18 ENCOUNTER — Encounter: Payer: Self-pay | Admitting: Psychiatry

## 2021-08-18 DIAGNOSIS — G4700 Insomnia, unspecified: Secondary | ICD-10-CM | POA: Diagnosis not present

## 2021-08-18 DIAGNOSIS — F331 Major depressive disorder, recurrent, moderate: Secondary | ICD-10-CM

## 2021-08-18 MED ORDER — QUETIAPINE FUMARATE 50 MG PO TABS: 50.0000 mg | ORAL_TABLET | Freq: Every day | ORAL | 0 refills | Status: DC

## 2021-08-18 NOTE — Telephone Encounter (Signed)
Talked with his wife, [name]. (Called the patient phone, and she answered it as he left his phone at home.) ?He is "acting strange."  He mixed up the date, and perseverates to get ready for the appointment with his oral surgeon. He turned on all lights inside and outside of the house, stating that he needs to go to this appointment. He is up all night.  He is talking to people who does not exist.  This morning, he states that he will go to her car to get the paperwork from ECT.  His wife recognized that he drove to somewhere. She denies any recent change in his medication. She denies any SI/HI/safety concern.  She agrees to bring him to the emergency room if any concerns. Informed petition process if needed. She agrees to make a follow up appointment- 5/11 at 9 Am for in person visit.

## 2021-08-18 NOTE — Progress Notes (Signed)
Virtual Visit via Video Note ? ?I connected with Ricky [name] on 08/18/21 at  9:30 AM EDT by a video enabled telemedicine application and verified that I am speaking with the correct person using two identifiers. ? ?Location: ?Patient: home ?Provider: office ?Persons participated in the visit- patient, provider  ?  ?I discussed the limitations of evaluation and management by telemedicine and the availability of in person appointments. The patient expressed understanding and agreed to proceed. ? ?  ?I discussed the assessment and treatment plan with the patient. The patient was provided an opportunity to ask questions and all were answered. The patient agreed with the plan and demonstrated an understanding of the instructions. ?  ?The patient was advised to call back or seek an in-person evaluation if the symptoms worsen or if the condition fails to improve as anticipated. ? ?I provided 16 minutes of non-face-to-face time during this encounter. ? ? ?Neysa Hottereina Jilliam Bellmore, MD ? ? ? ?BH MD/PA/NP OP Progress Note ? ?08/18/2021 10:01 AM ?Ricky [name]  ?MRN:  [id number] ? ?Chief Complaint:  ?Chief Complaint  ?Patient presents with  ? Follow-up  ? Depression  ? ?HPI:  ?This is a follow-up appointment for depression and anxiety. This appointment was initiated after his wife contacted us.  ? ?According to donna:  ?He is "acting strange."  He mixed up the date, and perseverates to get ready for the appointment with his oral surgeon. He turned on all lights inside and outside of the house, stating that he needs to go to this appointment. He is up all night.  He is talking to people who does not exist.  This morning, he states that he will go to her car to get the paperwork from ECT.  His wife recognized that he drove to somewhere. She denies any recent change in his medication. She denies any SI/HI/safety concern.   ? ?He states that he woke up, talking to people who does not exist.  He recognized that after Ricky LeashDonna inform him that they do  not exist.  He feels confused, and it has been difficult for him to keep up with his medication Ricky [name]([name] takes care of his medication).  He tends to stay in the bed or in the chair.  He does not feel depressed anymore. (According to Ricky LeashDonna, he has been engaging more vocally, although he does not get up physically).  He wishes to do things such as working on yard and fishing, although he is unable to do so as he does not have good energy to do it.  He denies any change in his Parkinson symptoms including tremors. He feels less anxious since the last visit.  He denies change in appetite.  He denies SI.   He denies paranoia or ideas of reference.  He denies any side effects since starting quetiapine. He is oriented x4.  ? ? ?Visit Diagnosis:  ?  ICD-10-CM   ?1. MDD (major depressive disorder), recurrent episode, moderate (HCC)  F33.1   ?  ?2. Insomnia, unspecified type  G47.00   ?  ? ? ?Past Psychiatric History: Please see initial evaluation for full details. I have reviewed the history. No updates at this time.  ?  ? ?Past Medical History:  ?Past Medical History:  ?Diagnosis Date  ? Anxiety   ? Arthritis   ? Carpal tunnel syndrome   ? Depression   ? Diabetes (HCC)   ? Hyperlipidemia   ? Hypertension   ? Loss of teeth due to extraction   ?  Parkinson's disease (HCC)   ?  ?Past Surgical History:  ?Procedure Laterality Date  ? CARPAL TUNNEL RELEASE Right 11/28/2020  ? Procedure: CARPAL TUNNEL RELEASE ENDOSCOPIC;  Surgeon: Christena Flake, MD;  Location: [facility];  Service: Orthopedics;  Laterality: Right;  ? CARPAL TUNNEL RELEASE Left 01/22/2021  ? Procedure: CARPAL TUNNEL RELEASE ENDOSCOPIC;  Surgeon: Christena Flake, MD;  Location: [facility];  Service: Orthopedics;  Laterality: Left;  ? KNEE ARTHROSCOPY Right   ? ? ?Family Psychiatric History: Please see initial evaluation for full details. I have reviewed the history. No updates at this time.  ?  ? ?Family History:  ?Family History  ?Problem Relation Age of Onset  ? Depression  Sister   ? Depression Brother   ? ? ?Social History:  ?Social History  ? ?Socioeconomic History  ? Marital status: Significant Other  ?  Spouse name: Ricky [name]  ? Number of children: Not on file  ? Years of education: Not on file  ? Highest education level: Not on file  ?Occupational History  ? Not on file  ?Tobacco Use  ? Smoking status: Never  ? Smokeless tobacco: Never  ?Vaping Use  ? Vaping Use: Never used  ?Substance and Sexual Activity  ? Alcohol use: Never  ? Drug use: Never  ? Sexual activity: Not on file  ?Other Topics Concern  ? Not on file  ?Social History Narrative  ? Lives with friend  ? ?Social Determinants of Health  ? ?Financial Resource Strain: Not on file  ?Food Insecurity: Not on file  ?Transportation Needs: Not on file  ?Physical Activity: Not on file  ?Stress: Not on file  ?Social Connections: Not on file  ? ? ?Allergies:  ?Allergies  ?Allergen Reactions  ? Mirtazapine Rash  ? ? ?Metabolic Disorder Labs: ?No results found for: HGBA1C, MPG ?No results found for: PROLACTIN ?No results found for: CHOL, TRIG, HDL, CHOLHDL, VLDL, LDLCALC ?No results found for: TSH ? ?Therapeutic Level Labs: ?No results found for: LITHIUM ?No results found for: VALPROATE ?No components found for:  CBMZ ? ?Current Medications: ?Current Outpatient Medications  ?Medication Sig Dispense Refill  ? amoxicillin (AMOXIL) 500 MG capsule Take by mouth.    ? atenolol (TENORMIN) 50 MG tablet Take 50 mg by mouth daily.    ? buPROPion (WELLBUTRIN XL) 150 MG 24 hr tablet Take 1 tablet (150 mg total) by mouth daily. Total of 450 mg daily. Take along with 300 mg tab 90 tablet 0  ? buPROPion (WELLBUTRIN XL) 300 MG 24 hr tablet Take 1 tablet (300 mg total) by mouth daily. Total of 450 mg daily. Take along with 150 mg tab 90 tablet 0  ? carbidopa-levodopa (SINEMET IR) 25-100 MG tablet Take 1 tablet by mouth 5 (five) times daily.    ? chlorthalidone (HYGROTON) 25 MG tablet Take 25 mg by mouth daily.    ? clonazePAM (KLONOPIN) 1 MG tablet Take  1 mg by mouth in the morning, at noon, and at bedtime.    ? DULoxetine (CYMBALTA) 30 MG capsule Take 3 capsules (90 mg total) by mouth daily. 270 capsule 0  ? enalapril (VASOTEC) 10 MG tablet Take 10 mg by mouth daily.    ? etodolac (LODINE) 500 MG tablet Take 500 mg by mouth as needed.    ? ezetimibe (ZETIA) 10 MG tablet Take 10 mg by mouth daily.    ? gabapentin (NEURONTIN) 100 MG capsule 300 mg at bedtime    ? ketoconazole (NIZORAL) 2 %  shampoo Apply 1 application topically every other day.    ? metFORMIN (GLUCOPHAGE) 500 MG tablet Take 500 mg by mouth 2 (two) times daily.    ? metFORMIN (GLUCOPHAGE) 500 MG tablet Take by mouth.    ? [START ON 08/19/2021] QUEtiapine (SEROQUEL) 50 MG tablet Take 1 tablet (50 mg total) by mouth at bedtime. 30 tablet 0  ? rosuvastatin (CRESTOR) 10 MG tablet Take 1 tablet by mouth daily.    ? sildenafil (VIAGRA) 50 MG tablet Take 50 mg by mouth daily as needed for erectile dysfunction.    ? simvastatin (ZOCOR) 20 MG tablet Take 20 mg by mouth daily.    ? traZODone (DESYREL) 50 MG tablet Take 50 mg by mouth at bedtime.    ? VICTOZA 18 MG/3ML SOPN Inject 1.2 mg into the skin daily.    ? ?No current facility-administered medications for this visit.  ? ? ? ?Musculoskeletal: ?Strength & Muscle Tone:  N/A ?Gait & Station:  N/A ?Patient leans: N/A ? ?Psychiatric Specialty Exam: ?Review of Systems  ?Psychiatric/Behavioral:  Positive for decreased concentration and sleep disturbance. Negative for agitation, behavioral problems, confusion, dysphoric mood, hallucinations, self-injury and suicidal ideas. The patient is not nervous/anxious and is not hyperactive.   ?All other systems reviewed and are negative.  ?There were no vitals taken for this visit.There is no height or weight on file to calculate BMI.  ?General Appearance: Fairly Groomed  ?Eye Contact:  Fair  ?Speech:  Clear and Coherent  ?Volume:  Normal  ?Mood:   "not depressed"  ?Affect:  Appropriate, Congruent, and Restricted  ?Thought  Process:  Coherent  ?Orientation:  Full (Time, Place, and Person)  ?Thought Content: Logical   ?Suicidal Thoughts:  No  ?Homicidal Thoughts:  No  ?Memory:  Immediate;   Good  ?Judgement:  Good  ?Insi

## 2021-08-18 NOTE — Patient Instructions (Signed)
Continue duloxetine 90 mg daily  ?Increase quetiapine 50 mg at night  ?Continue bupropion 450 mg daily ?Continue trazodone 50 mg at night ? Continue clonazepam 1 mg three times a day for anxiety ?Next appointment- 5/11 at 9 AM f ?

## 2021-08-18 NOTE — Telephone Encounter (Signed)
when you have time can you call this patient back.  Lavonia Drafts called states that he has days mixed up. states that he thinks he got appt and he getting ready and she told him that his has appt on Tuesday.  she also talk she feel like to voices in his head.  she states he acting strange.   ? ?Offered her the 9:00 slot but she states he took off in the car and said he going to [place] and she doesn't know if he should be driving .  ?

## 2021-08-25 ENCOUNTER — Other Ambulatory Visit: Payer: Self-pay

## 2021-08-25 ENCOUNTER — Encounter: Payer: Self-pay | Admitting: Internal Medicine

## 2021-08-25 ENCOUNTER — Observation Stay
Admission: EM | Admit: 2021-08-25 | Discharge: 2021-08-28 | Disposition: A | Discharge status: Home Health | Payer: 59 | Attending: Internal Medicine | Admitting: Internal Medicine

## 2021-08-25 ENCOUNTER — Emergency Department: Payer: 59

## 2021-08-25 DIAGNOSIS — R4182 Altered mental status, unspecified: Secondary | ICD-10-CM | POA: Diagnosis present

## 2021-08-25 DIAGNOSIS — E1169 Type 2 diabetes mellitus with other specified complication: Secondary | ICD-10-CM | POA: Diagnosis present

## 2021-08-25 DIAGNOSIS — E871 Hypo-osmolality and hyponatremia: Secondary | ICD-10-CM | POA: Diagnosis not present

## 2021-08-25 DIAGNOSIS — I10 Essential (primary) hypertension: Secondary | ICD-10-CM | POA: Diagnosis not present

## 2021-08-25 DIAGNOSIS — Z6829 Body mass index (BMI) 29.0-29.9, adult: Secondary | ICD-10-CM | POA: Diagnosis not present

## 2021-08-25 DIAGNOSIS — E785 Hyperlipidemia, unspecified: Secondary | ICD-10-CM | POA: Diagnosis not present

## 2021-08-25 DIAGNOSIS — E663 Overweight: Secondary | ICD-10-CM | POA: Diagnosis present

## 2021-08-25 DIAGNOSIS — Z7984 Long term (current) use of oral hypoglycemic drugs: Secondary | ICD-10-CM | POA: Insufficient documentation

## 2021-08-25 DIAGNOSIS — F329 Major depressive disorder, single episode, unspecified: Secondary | ICD-10-CM | POA: Diagnosis present

## 2021-08-25 DIAGNOSIS — G20 Parkinson's disease: Secondary | ICD-10-CM | POA: Diagnosis not present

## 2021-08-25 DIAGNOSIS — I951 Orthostatic hypotension: Secondary | ICD-10-CM | POA: Diagnosis present

## 2021-08-25 DIAGNOSIS — Z79899 Other long term (current) drug therapy: Secondary | ICD-10-CM | POA: Insufficient documentation

## 2021-08-25 DIAGNOSIS — R441 Visual hallucinations: Secondary | ICD-10-CM | POA: Diagnosis not present

## 2021-08-25 DIAGNOSIS — G20A1 Parkinson's disease without dyskinesia, without mention of fluctuations: Secondary | ICD-10-CM | POA: Diagnosis present

## 2021-08-25 LAB — COMPREHENSIVE METABOLIC PANEL
ALT: 30 U/L (ref 0–44)
AST: 24 U/L (ref 15–41)
Albumin: 4.7 g/dL (ref 3.5–5.0)
Alkaline Phosphatase: 55 U/L (ref 38–126)
Anion gap: 10 (ref 5–15)
BUN: 22 mg/dL (ref 8–23)
CO2: 27 mmol/L (ref 22–32)
Calcium: 9.7 mg/dL (ref 8.9–10.3)
Chloride: 92 mmol/L — ABNORMAL LOW (ref 98–111)
Creatinine, Ser: 1.11 mg/dL (ref 0.61–1.24)
GFR, Estimated: >60 mL/min (ref >60)
Glucose, Bld: 227 mg/dL — ABNORMAL HIGH (ref 70–99)
Potassium: 4.1 mmol/L (ref 3.5–5.1)
Sodium: 129 mmol/L — ABNORMAL LOW (ref 135–145)
Total Bilirubin: 1.3 mg/dL — ABNORMAL HIGH (ref 0.3–1.2)
Total Protein: 7.8 g/dL (ref 6.5–8.1)

## 2021-08-25 LAB — URINALYSIS, ROUTINE W REFLEX MICROSCOPIC
Bacteria, UA: NONE SEEN
Bilirubin Urine: NEGATIVE
Glucose, UA: NEGATIVE mg/dL
Hgb urine dipstick: NEGATIVE
Ketones, ur: NEGATIVE mg/dL
Leukocytes,Ua: NEGATIVE
Nitrite: NEGATIVE
Protein, ur: NEGATIVE mg/dL
Specific Gravity, Urine: 1.016 (ref 1.005–1.030)
pH: 6 (ref 5.0–8.0)

## 2021-08-25 LAB — CBC
HCT: 47.9 % (ref 39.0–52.0)
Hemoglobin: 16.1 g/dL (ref 13.0–17.0)
MCH: 29.2 pg (ref 26.0–34.0)
MCHC: 33.6 g/dL (ref 30.0–36.0)
MCV: 86.9 fL (ref 80.0–100.0)
Platelets: 190 10*3/uL (ref 150–400)
RBC: 5.51 MIL/uL (ref 4.22–5.81)
RDW: 12.7 % (ref 11.5–15.5)
WBC: 6.9 10*3/uL (ref 4.0–10.5)
nRBC: 0 % (ref 0.0–0.2)

## 2021-08-25 LAB — GLUCOSE, CAPILLARY
Glucose-Capillary: 122 mg/dL — ABNORMAL HIGH (ref 70–99)
Glucose-Capillary: 111 mg/dL — ABNORMAL HIGH (ref 70–99)

## 2021-08-25 LAB — HEMOGLOBIN A1C
Hgb A1c MFr Bld: 5.2 % (ref 4.8–5.6)
Mean Plasma Glucose: 102.54 mg/dL

## 2021-08-25 LAB — SODIUM, URINE, RANDOM: Sodium, Ur: 86 mmol/L

## 2021-08-25 LAB — OSMOLALITY, URINE: Osmolality, Ur: 509 mOsm/kg (ref 300–900)

## 2021-08-25 LAB — TSH: TSH: 3.886 u[IU]/mL (ref 0.350–4.500)

## 2021-08-25 LAB — AMMONIA: Ammonia: 26 umol/L (ref 9–35)

## 2021-08-25 MED ORDER — FOLIC ACID 1 MG PO TABS
1.0000 mg | ORAL_TABLET | Freq: Every day | ORAL | Status: DC
  Administered 2021-08-25 – 2021-08-28 (×4): 1 mg via ORAL
  Filled 2021-08-25 (×4): qty 1

## 2021-08-25 MED ORDER — SODIUM CHLORIDE 0.9 % IV BOLUS
500.0000 mL | Freq: Once | INTRAVENOUS | Status: AC
  Administered 2021-08-25: 500 mL via INTRAVENOUS

## 2021-08-25 MED ORDER — TRAZODONE HCL 50 MG PO TABS
50.0000 mg | ORAL_TABLET | Freq: Every day | ORAL | Status: DC
  Administered 2021-08-25 – 2021-08-27 (×3): 50 mg via ORAL
  Filled 2021-08-25 (×4): qty 1

## 2021-08-25 MED ORDER — ONDANSETRON HCL 4 MG/2ML IJ SOLN
4.0000 mg | Freq: Four times a day (QID) | INTRAMUSCULAR | Status: DC | PRN
Start: 2021-08-25 — End: 2021-08-28

## 2021-08-25 MED ORDER — ROSUVASTATIN CALCIUM 10 MG PO TABS
20.0000 mg | ORAL_TABLET | Freq: Every evening | ORAL | Status: DC
  Administered 2021-08-27 – 2021-08-28 (×2): 20 mg via ORAL
  Filled 2021-08-25: qty 2
  Filled 2021-08-25: qty 1
  Filled 2021-08-25 (×2): qty 2

## 2021-08-25 MED ORDER — SODIUM CHLORIDE 0.9 % IV SOLN: INTRAVENOUS | Status: DC

## 2021-08-25 MED ORDER — INSULIN ASPART 100 UNIT/ML IJ SOLN
0.0000 [IU] | Freq: Three times a day (TID) | INTRAMUSCULAR | Status: DC
  Administered 2021-08-25: 1 [IU] via SUBCUTANEOUS
  Administered 2021-08-26 (×2): 2 [IU] via SUBCUTANEOUS
  Administered 2021-08-26 – 2021-08-27 (×3): 1 [IU] via SUBCUTANEOUS
  Administered 2021-08-27 – 2021-08-28 (×2): 2 [IU] via SUBCUTANEOUS
  Filled 2021-08-25 (×8): qty 1

## 2021-08-25 MED ORDER — CHLORTHALIDONE 25 MG PO TABS: 12.5000 mg | ORAL_TABLET | Freq: Every day | ORAL | Status: DC

## 2021-08-25 MED ORDER — ACETAMINOPHEN 650 MG RE SUPP: 650.0000 mg | Freq: Four times a day (QID) | RECTAL | Status: DC | PRN

## 2021-08-25 MED ORDER — ENOXAPARIN SODIUM 40 MG/0.4ML IJ SOSY
40.0000 mg | PREFILLED_SYRINGE | INTRAMUSCULAR | Status: DC
  Administered 2021-08-25 – 2021-08-27 (×2): 40 mg via SUBCUTANEOUS
  Filled 2021-08-25 (×3): qty 0.4

## 2021-08-25 MED ORDER — METFORMIN HCL 500 MG PO TABS
1000.0000 mg | ORAL_TABLET | Freq: Two times a day (BID) | ORAL | Status: DC
  Administered 2021-08-25 – 2021-08-26 (×2): 1000 mg via ORAL
  Filled 2021-08-25 (×2): qty 2

## 2021-08-25 MED ORDER — ARIPIPRAZOLE 2 MG PO TABS
10.0000 mg | ORAL_TABLET | Freq: Every day | ORAL | Status: DC
  Administered 2021-08-25 – 2021-08-28 (×4): 10 mg via ORAL
  Filled 2021-08-25: qty 1
  Filled 2021-08-25 (×3): qty 5

## 2021-08-25 MED ORDER — THIAMINE HCL 100 MG/ML IJ SOLN
100.0000 mg | Freq: Every day | INTRAMUSCULAR | Status: DC
  Administered 2021-08-25 – 2021-08-28 (×4): 100 mg via INTRAVENOUS
  Filled 2021-08-25 (×4): qty 2

## 2021-08-25 MED ORDER — DULOXETINE HCL 30 MG PO CPEP
90.0000 mg | ORAL_CAPSULE | Freq: Every day | ORAL | Status: DC
  Administered 2021-08-25 – 2021-08-28 (×4): 90 mg via ORAL
  Filled 2021-08-25 (×5): qty 3

## 2021-08-25 MED ORDER — ACETAMINOPHEN 325 MG PO TABS: 650.0000 mg | ORAL_TABLET | Freq: Four times a day (QID) | ORAL | Status: DC | PRN

## 2021-08-25 MED ORDER — CLONAZEPAM 1 MG PO TABS
1.0000 mg | ORAL_TABLET | Freq: Three times a day (TID) | ORAL | Status: DC
  Administered 2021-08-25 – 2021-08-26 (×3): 1 mg via ORAL
  Filled 2021-08-25: qty 2
  Filled 2021-08-25 (×2): qty 1

## 2021-08-25 MED ORDER — INSULIN ASPART 100 UNIT/ML IJ SOLN: 0.0000 [IU] | Freq: Every day | INTRAMUSCULAR | Status: DC

## 2021-08-25 MED ORDER — CARBIDOPA-LEVODOPA 25-100 MG PO TABS
1.0000 | ORAL_TABLET | Freq: Every day | ORAL | Status: DC
  Administered 2021-08-25 – 2021-08-28 (×15): 1 via ORAL
  Filled 2021-08-25 (×15): qty 1

## 2021-08-25 MED ORDER — BUPROPION HCL ER (XL) 150 MG PO TB24
450.0000 mg | ORAL_TABLET | Freq: Every day | ORAL | Status: DC
  Administered 2021-08-25 – 2021-08-28 (×4): 450 mg via ORAL
  Filled 2021-08-25 (×4): qty 3

## 2021-08-25 MED ORDER — ONDANSETRON HCL 4 MG PO TABS: 4.0000 mg | ORAL_TABLET | Freq: Four times a day (QID) | ORAL | Status: DC | PRN

## 2021-08-25 MED ORDER — QUETIAPINE FUMARATE 25 MG PO TABS
25.0000 mg | ORAL_TABLET | Freq: Every day | ORAL | Status: DC
  Administered 2021-08-25: 25 mg via ORAL
  Filled 2021-08-25: qty 1

## 2021-08-25 NOTE — Plan of Care (Signed)

## 2021-08-25 NOTE — Evaluation (Signed)
Physical Therapy Evaluation ?Patient Details ?Name: Ricky [name] ?MRN: [id number] ?DOB: April 15, 1956 ?Today's Date: 08/25/2021 ? ?History of Present Illness ? presented to ER secondary to weakness, AMS, visual hallucinations; CTH negative for acute changes.  ?Clinical Impression ? Patient resting in bed upon arrival to room; alert and oriented to basic information (name, location, reason for admission, month and year).  Follows simple commands, but does display intermittent confusion and difficulty sustaining attention to task/conversation at times.  Denies pain.  Bilat UE/LE strength and ROM grossly symmetrical and WFL for basic transfers and gait; no focal weakness appreciated.  Mild tremors R > L UE; unchanged from baseline per patient.  Able to complete bed mobility with mod indep; sit/stand, basic transfers and gait (120') without assist device, min assist; gait (100') with RW, cga/min assist.  Demonstrates improved gait symmetry, fluidity and overall stability with use of assist device; do recommend continued use of RW at this time.  Patient voiced understanding and agreement. ?Of note, orthostatic assessment completed/documented. Noted mild drop in BP with sustained standing/gait, but asymptomatic and does recover with return to seated position ?Would benefit from skilled PT to address above deficits and promote optimal return to PLOF.; Recommend transition to HHPT upon discharge from acute hospitalization. ? ?   ? ?Recommendations for follow up therapy are one component of a multi-disciplinary discharge planning process, led by the attending physician.  Recommendations may be updated based on patient status, additional functional criteria and insurance authorization. ? ?Follow Up Recommendations Home health PT ? ?  ?Assistance Recommended at Discharge PRN  ?Patient can return home with the following ? A little help with walking and/or transfers;A little help with bathing/dressing/bathroom ? ?  ?Equipment  Recommendations Rolling walker (2 wheels)  ?Recommendations for Other Services ?    ?  ?Functional Status Assessment Patient has had a recent decline in their functional status and demonstrates the ability to make significant improvements in function in a reasonable and predictable amount of time.  ? ?  ?Precautions / Restrictions Precautions ?Precautions: Fall ?Restrictions ?Weight Bearing Restrictions: No  ? ?  ? ?Mobility ? Bed Mobility ?Overal bed mobility: Modified Independent ?  ?  ?  ?  ?  ?  ?  ?  ? ?Transfers ?Overall transfer level: Needs assistance ?Equipment used: Rolling walker (2 wheels) ?Transfers: Sit to/from Stand ?Sit to Stand: Min guard, Min assist ?  ?  ?  ?  ?  ?General transfer comment: multiple attempts, increased use of momentum for anterior weight translation and lift off ?  ? ?Ambulation/Gait ?Ambulation/Gait assistance: Min guard, Min assist ?Gait Distance (Feet): 120 Feet ?Assistive device: Rolling walker (2 wheels) ?  ?  ?  ?  ?General Gait Details: reciprocal stepping pattern with fair step height/length, mild R LE IR throughout gait cycle; variable cadence and gait speed; mild sway with dynamic gait components.  Optimal safety/indep with [place] of RW ? ?Stairs ?  ?  ?  ?  ?  ? ?Wheelchair Mobility ?  ? ?Modified Rankin (Stroke Patients Only) ?  ? ?  ? ?Balance Overall balance assessment: Needs assistance ?Sitting-balance support: No upper extremity supported, Feet supported ?Sitting balance-Leahy Scale: Good ?  ?  ?Standing balance support: No upper extremity supported ?Standing balance-Leahy Scale: Fair ?  ?  ?  ?  ?  ?  ?  ?  ?  ?  ?  ?  ?   ? ? ? ?Pertinent Vitals/Pain Pain Assessment ?Pain Assessment: No/denies pain  ? ? ?  Home Living Family/patient expects to be discharged to:: Private residence ?Living Arrangements: Spouse/significant other ?Available Help at Discharge: Family ?Type of Home: House ?Home Access: Stairs to enter ?Entrance Stairs-Rails: Right ?Entrance Stairs-Number of  Steps: 5-7 ?  ?Home Layout: One level ?  ?   ?  ?Prior Function Prior Level of Function : Independent/Modified Independent ?  ?  ?  ?  ?  ?  ?Mobility Comments: Indep with ADLs, household mobilization; denies recent fall history ?  ?  ? ? ?Hand Dominance  ? Dominant Hand: Right ? ?  ?Extremity/Trunk Assessment  ? Upper Extremity Assessment ?Upper Extremity Assessment: Overall WFL for tasks assessed (grossly 4/5 throughout, mild tremors R > L) ?  ? ?Lower Extremity Assessment ?Lower Extremity Assessment: Overall WFL for tasks assessed (grossl 4 to 4+/5 throughout) ?  ? ?   ?Communication  ? Communication: No difficulties  ?Cognition Arousal/Alertness: Awake/alert ?Behavior During Therapy: [facility] for tasks assessed/performed ?Overall Cognitive Status: Within Functional Limits for tasks assessed ?  ?  ?  ?  ?  ?  ?  ?  ?  ?  ?  ?  ?  ?  ?  ?  ?  ?  ?  ? ?  ?General Comments   ? ?  ?Exercises Other Exercises ?Other Exercises: 100' with RW, cga-improved gait symmetry, fluidity and overall stability with use of assist device; do recommend continued use of RW at this time.  Patient voiced understanding and agreement. ?Other Exercises: Unsupported sitting edge of bed, set up/sup to don bilat shoes; fair/good movement outside immediate BOS, good trunk control/stability; limited LE flexibility ?Other Exercises: Orthostatic assessment-see vitals flowsheet for details. Mild drop in BP with sustained standing/gait, but asymptomatic and does recover with return to seated position  ? ?Assessment/Plan  ?  ?PT Assessment Patient needs continued PT services  ?PT Problem List Decreased strength;Decreased activity tolerance;Decreased balance;Decreased mobility;Decreased cognition;Decreased safety awareness;Decreased knowledge of precautions ? ?   ?  ?PT Treatment Interventions DME instruction;Gait training;Stair training;Functional mobility training;Therapeutic activities;Therapeutic exercise;Balance training;Patient/family  education;Cognitive remediation   ? ?PT Goals (Current goals can be found in the Care Plan section)  ?Acute Rehab PT Goals ?Patient Stated Goal: to return home ?PT Goal Formulation: With patient ?Time For Goal Achievement: 09/08/21 ?Potential to Achieve Goals: Good ? ?  ?Frequency Min 2X/week ?  ? ? ?Co-evaluation   ?  ?  ?  ?  ? ? ?  ?AM-PAC PT "6 Clicks" Mobility  ?Outcome Measure Help needed turning from your back to your side while in a flat bed without using bedrails?: None ?Help needed moving from lying on your back to sitting on the side of a flat bed without using bedrails?: None ?Help needed moving to and from a bed to a chair (including a wheelchair)?: A Little ?Help needed standing up from a chair using your arms (e.g., wheelchair or bedside chair)?: A Little ?Help needed to walk in hospital room?: A Little ?Help needed climbing 3-5 steps with a railing? : A Little ?6 Click Score: 20 ? ?  ?End of Session Equipment Utilized During Treatment: Gait belt ?Activity Tolerance: Patient tolerated treatment well ?Patient left: in bed;with call bell/phone within reach ?Nurse Communication: Mobility status ?PT Visit Diagnosis: Muscle weakness (generalized) (M62.81);Difficulty in walking, not elsewhere classified (R26.2) ?  ? ?Time: [id number] ?PT Time Calculation (min) (ACUTE ONLY): 33 min ? ? ?Charges:   PT Evaluation ?$PT Eval Moderate Complexity: 1 Mod ?PT Treatments ?$Therapeutic Activity: 8-22 mins ?  ?   ? ?  Daleah Coulson H. Manson Passey, PT, DPT, NCS ?08/25/21, 3:47 PM ?[phone] ? ?

## 2021-08-25 NOTE — Consult Note (Signed)
[facility] Face-to-Face Psychiatry Consult  ? ?Reason for Consult:  Visual hallucinations ?Referring Physician:  Renae Gloss ?Patient Identification: Ricky [name] ?MRN:  [id number] ?Principal Diagnosis: Visual hallucinations ?Diagnosis:  Principal Problem: ?  Visual hallucinations ? ? ?Total Time spent with patient: 30 minutes ? ?Subjective:  He describes "psychodelic people" only when he wakes, once yesterday and once today.  ?Ricky [name] is a 66 y.o. male patient admitted with visual hallucinations.  ?HPI:  Patient seen and chart reviewed. Patient is alert and oriented x 3, but is somewhat confused about timing of events.  ?Patient is under the impression that he will continue ECT treatments because he says that it is helping his depression.  ?We were interrupted when transport came to take patient to inpatient room.  Will continue when patient gets to his room.  ? ?1800: Saw pt in his room. partner at bedside.  Patient is alert.  He remembers that I was with him earlier.  History is a little sketchy, but it appears as though he may have had these hallucinations when he was off the Seroquel. He states that he is feeling better now that he has had something to eat.  Of note, patient's sodium level is 129, chloride 92.  UDS has not been obtained yet.  Patient's wife was concerned about his "weakness."  Discussed various reasons.  Will check on patient tomorrow.  He is not have any further visual hallucinations today.  He denies suicidal or homicidal ideations.Does not appear anxious. Calm, pleasant, cooperative.   Patient appears very tired.  We will check on patient tomorrow.  Patient requested that Dr. Toni Amend come by to see him tomorrow as well.  ? ?Past Psychiatric History: Anxiety, depression.  Recent ECT  ? ?Risk to Self:   ?Risk to Others:   ?Prior Inpatient Therapy:   ?Prior Outpatient Therapy:   ? ?Past Medical History:  ?Past Medical History:  ?Diagnosis Date  ? Anxiety   ? Arthritis   ? Carpal tunnel syndrome   ?  Depression   ? Diabetes (HCC)   ? Hyperlipidemia   ? Hypertension   ? Loss of teeth due to extraction   ? Parkinson's disease (HCC)   ?  ?Past Surgical History:  ?Procedure Laterality Date  ? CARPAL TUNNEL RELEASE Right 11/28/2020  ? Procedure: CARPAL TUNNEL RELEASE ENDOSCOPIC;  Surgeon: Christena Flake, MD;  Location: [facility] ORS;  Service: Orthopedics;  Laterality: Right;  ? CARPAL TUNNEL RELEASE Left 01/22/2021  ? Procedure: CARPAL TUNNEL RELEASE ENDOSCOPIC;  Surgeon: Christena Flake, MD;  Location: [facility] ORS;  Service: Orthopedics;  Laterality: Left;  ? KNEE ARTHROSCOPY Right   ? ?Family History:  ?Family History  ?Problem Relation Age of Onset  ? Diabetes Mother   ? Hypertension Mother   ? CVA Father   ? Heart attack Father   ? Depression Sister   ? Depression Brother   ? ?Family Psychiatric  History: none reported ? ?Social History:  ?Social History  ? ?Substance and Sexual Activity  ?Alcohol Use Never  ?   ?Social History  ? ?Substance and Sexual Activity  ?Drug Use Never  ?  ?Social History  ? ?Socioeconomic History  ? Marital status: Single  ?  Spouse name: Ricky [name]  ? Number of children: Not on file  ? Years of education: Not on file  ? Highest education level: Not on file  ?Occupational History  ? Not on file  ?Tobacco Use  ? Smoking status: Never  ? Smokeless tobacco:  Never  ?Vaping Use  ? Vaping Use: Never used  ?Substance and Sexual Activity  ? Alcohol use: Never  ? Drug use: Never  ? Sexual activity: Not on file  ?Other Topics Concern  ? Not on file  ?Social History Narrative  ? Lives with friend  ? ?Social Determinants of Health  ? ?Financial Resource Strain: Not on file  ?Food Insecurity: Not on file  ?Transportation Needs: Not on file  ?Physical Activity: Not on file  ?Stress: Not on file  ?Social Connections: Not on file  ? ?Additional Social History: ?  ? ?Allergies:   ?Allergies  ?Allergen Reactions  ? Mirtazapine Rash  ? ? ?Labs:  ?Results for orders placed or performed during the hospital encounter of  08/25/21 (from the past 48 hour(s))  ?Comprehensive metabolic panel     Status: Abnormal  ? Collection Time: 08/25/21 10:02 AM  ?Result Value Ref Range  ? Sodium 129 (L) 135 - 145 mmol/L  ? Potassium 4.1 3.5 - 5.1 mmol/L  ? Chloride 92 (L) 98 - 111 mmol/L  ? CO2 27 22 - 32 mmol/L  ? Glucose, Bld 227 (H) 70 - 99 mg/dL  ?  Comment: Glucose reference range applies only to samples taken after fasting for at least 8 hours.  ? BUN 22 8 - 23 mg/dL  ? Creatinine, Ser 1.11 0.61 - 1.24 mg/dL  ? Calcium 9.7 8.9 - 10.3 mg/dL  ? Total Protein 7.8 6.5 - 8.1 g/dL  ? Albumin 4.7 3.5 - 5.0 g/dL  ? AST 24 15 - 41 U/L  ? ALT 30 0 - 44 U/L  ? Alkaline Phosphatase 55 38 - 126 U/L  ? Total Bilirubin 1.3 (H) 0.3 - 1.2 mg/dL  ? GFR, Estimated >60 >60 mL/min  ?  Comment: (NOTE) ?Calculated using the CKD-EPI Creatinine Equation (2021) ?  ? Anion gap 10 5 - 15  ?  Comment: Performed at [facility], [address] [place], [place] [address]  ?CBC     Status: None  ? Collection Time: 08/25/21 10:02 AM  ?Result Value Ref Range  ? WBC 6.9 4.0 - 10.5 K/uL  ? RBC 5.51 4.22 - 5.81 MIL/uL  ? Hemoglobin 16.1 13.0 - 17.0 g/dL  ? HCT 47.9 39.0 - 52.0 %  ? MCV 86.9 80.0 - 100.0 fL  ? MCH 29.2 26.0 - 34.0 pg  ? MCHC 33.6 30.0 - 36.0 g/dL  ? RDW 12.7 11.5 - 15.5 %  ? Platelets 190 150 - 400 K/uL  ? nRBC 0.0 0.0 - 0.2 %  ?  Comment: Performed at [facility], [address] [place], [place] [address]  ?TSH     Status: None  ? Collection Time: 08/25/21 10:02 AM  ?Result Value Ref Range  ? TSH 3.886 0.350 - 4.500 uIU/mL  ?  Comment: Performed by a 3rd Generation assay with a functional sensitivity of <=0.01 uIU/mL. ?Performed at [facility], [address] [place], [place] [address] ?  ?Hemoglobin A1c     Status: None  ? Collection Time: 08/25/21 10:02 AM  ?Result Value Ref Range  ? Hgb A1c MFr Bld 5.2 4.8 - 5.6 %  ?  Comment: (NOTE) ?Pre diabetes:          5.7%-6.4% ? ?Diabetes:              >6.4% ? ?Glycemic control for    <7.0% ?adults with diabetes ?  ? Mean Plasma Glucose 102.54 mg/dL  ?  Comment: Performed at [facility], [address], [place], [place] [address]  ?Urinalysis, Routine w reflex microscopic     Status: Abnormal  ? Collection Time: 08/25/21 10:05 AM  ?Result Value Ref Range  ? Color, Urine YELLOW (A) YELLOW  ? APPearance CLEAR (A) CLEAR  ? Specific Gravity, Urine 1.016 1.005 - 1.030  ? pH 6.0 5.0 - 8.0  ? Glucose, UA NEGATIVE NEGATIVE mg/dL  ? Hgb urine dipstick NEGATIVE NEGATIVE  ? Bilirubin Urine NEGATIVE NEGATIVE  ? Ketones, ur NEGATIVE NEGATIVE mg/dL  ? Protein, ur NEGATIVE NEGATIVE mg/dL  ? Nitrite NEGATIVE NEGATIVE  ? Leukocytes,Ua NEGATIVE NEGATIVE  ? WBC, UA 0-5 0 - 5 WBC/hpf  ? Bacteria, UA NONE SEEN NONE SEEN  ? Squamous Epithelial / LPF 0-5 0 - 5  ? Mucus PRESENT   ? Hyaline Casts, UA PRESENT   ?  Comment: Performed at [facility], [address], [place], [place] [address]  ?Sodium, urine, random     Status: None  ? Collection Time: 08/25/21 10:05 AM  ?Result Value Ref Range  ? Sodium, Ur 86 mmol/L  ?  Comment: Performed at [facility], [address] [place], [place] [address]  ?Osmolality, urine     Status: None  ? Collection Time: 08/25/21 10:05 AM  ?Result Value Ref Range  ? Osmolality, Ur 509 300 - 900 mOsm/kg  ?  Comment: REPEATED TO VERIFY ?Performed at [facility], [address], [place], [place] [address] ?  ?Ammonia     Status: None  ? Collection Time: 08/25/21 10:59 AM  ?Result Value Ref Range  ? Ammonia 26 9 - 35 umol/L  ?  Comment: Performed at [facility], [address], [place], [place] [address]  ?Glucose, capillary     Status: Abnormal  ? Collection Time: 08/25/21  4:54 PM  ?Result Value Ref Range  ? Glucose-Capillary 122 (H) 70 - 99 mg/dL  ?  Comment: Glucose reference range applies only to samples taken after fasting for at least 8 hours.  ? ? ?Current Facility-Administered Medications  ?Medication Dose Route Frequency Provider Last  Rate Last Admin  ? 0.9 %  sodium chloride infusion   Intravenous Continuous Alford Highland, MD 50 mL/hr at 08/25/21 1751 New Bag at 08/25/21 1751  ? acetaminophen (TYLENOL) tablet 650 mg  650 mg Oral Q6

## 2021-08-25 NOTE — ED Provider Notes (Signed)
? ?[facility] ?Provider Note ? ? ? Event Date/Time  ? First MD Initiated Contact with Patient 08/25/21 1033   ?  (approximate) ? ? ?History  ? ?Altered Mental Status ? ? ?HPI ? ?Josten Warmuth is a 66 y.o. male with a past medical history depression undergoing outpatient ECT therapy, DM, HTN, HDL, Parkinson's, anxiety and arthritis who presents coming by self with concerns for increased weakness and confusion and hallucinations.  Patient's last ECT therapy was last week but it sounds like the plans are to stop as this is not been effective.  He had a increased dose of his quetiapine and is also being treated with bupropion, carbidopa levodopa, clonazepam and trazodone.  No recent falls although patient has come very close to falling couple times it seems in the last week.  No headache, vision changes, fevers, cough, shortness of breath, abdominal pain, vomiting, diarrhea or any rashes.  The only medication change they can think of is that the quetiapine was increased. ? ? ? ?Past Medical History:  ?Diagnosis Date  ? Anxiety   ? Arthritis   ? Carpal tunnel syndrome   ? Depression   ? Diabetes (HCC)   ? Hyperlipidemia   ? Hypertension   ? Loss of teeth due to extraction   ? Parkinson's disease (HCC)   ? ? ? ?  ? ? ?Physical Exam  ?Triage Vital Signs: ?ED Triage Vitals  ?Enc Vitals Group  ?   BP 08/25/21 0952 111/82  ?   Pulse Rate 08/25/21 0952 77  ?   Resp 08/25/21 0952 (!) 178  ?   Temp 08/25/21 1003 97.6 ?F (36.4 ?C)  ?   Temp Source 08/25/21 1003 Oral  ?   SpO2 08/25/21 0952 100 %  ?   Weight 08/25/21 0952 185 lb (83.9 kg)  ?   Height 08/25/21 0952 5\' 9"  (1.753 m)  ?   Head Circumference --   ?   Peak Flow --   ?   Pain Score 08/25/21 0952 3  ?   Pain Loc --   ?   Pain Edu? --   ?   Excl. in GC? --   ? ? ?Most recent vital signs: ?Vitals:  ? 08/25/21 1003 08/25/21 1100  ?BP:  128/82  ?Pulse:  76  ?Resp:  11  ?Temp: 97.6 ?F (36.4 ?C)   ?SpO2:  99%  ? ? ?General: Awake, no distress.  ?CV:  Good  peripheral perfusion.  ?Resp:  Normal effort.  ?Abd:  No distention.  ?Other:  Cranial nerves II through XII grossly intact.  No pronator drift.  No finger dysmetria.  Symmetric 5/5 strength of all extremities.  Sensation intact to light touch in all extremities.  U bilateral upper extremity tremor noted.  Patient is oriented to year only stating at this moment may. ? ? ?ED Results / Procedures / Treatments  ?Labs ?(all labs ordered are listed, but only abnormal results are displayed) ?Labs Reviewed  ?COMPREHENSIVE METABOLIC PANEL - Abnormal; Notable for the following components:  ?    Result Value  ? Sodium 129 (*)   ? Chloride 92 (*)   ? Glucose, Bld 227 (*)   ? Total Bilirubin 1.3 (*)   ? All other components within normal limits  ?URINALYSIS, ROUTINE W REFLEX MICROSCOPIC - Abnormal; Notable for the following components:  ? Color, Urine YELLOW (*)   ? APPearance CLEAR (*)   ? All other components within normal limits  ?  CBC  ?TSH  ?AMMONIA  ?CBG MONITORING, ED  ? ? ? ?EKG ? ?ECGs remarkable sinus rhythm with a ventricular rate of 75, normal axis, unremarkable intervals without clear evidence of acute ischemia or significant arrhythmia there is some artifact in V3. ? ? ?RADIOLOGY ?CT head on my interpretation without evidence of acute ischemia, hemorrhage, edema, hydrocephalus or other clear acute process.  I also reviewed radiology interpretation and agree to findings of small vessel chronic ischemic disease and generalized atrophy without acute process.  ? ? ?PROCEDURES: ? ?Critical Care performed: No ? ?.1-3 Lead EKG Interpretation ?Performed by: Gilles Chiquito, MD ?Authorized by: Gilles Chiquito, MD  ? ?  Interpretation: normal   ?  ECG rate assessment: normal   ?  Rhythm: sinus rhythm   ?  Ectopy: none   ?  Conduction: normal   ? ?The patient is on the cardiac monitor to evaluate for evidence of arrhythmia and/or significant heart rate changes. ? ? ?MEDICATIONS ORDERED IN ED: ?Medications - No data to  display ? ? ?IMPRESSION / MDM / ASSESSMENT AND PLAN / ED COURSE  ?I reviewed the triage vital signs and the nursing notes. ?             ?               ? ?Differential diagnosis includes, but is not limited to CVA although seems less likely given there are no clear focal deficits, polypharmacy, metabolic derangements, acute infectious process, progression of Parkinson's, endocrine derangements, arrhythmia and anemia. ? ?ECGs remarkable sinus rhythm with a ventricular rate of 75, normal axis, unremarkable intervals without clear evidence of acute ischemia or significant arrhythmia there is some artifact in V3. ? ?CT head on my interpretation without evidence of acute ischemia, hemorrhage, edema, hydrocephalus or other clear acute process.  I also reviewed radiology interpretation and agree to findings of small vessel chronic ischemic disease and generalized atrophy without acute process.  ? ?CMP is remarkable for glucose of 227 and a sodium of 129 without any other significant electrolyte or metabolic derangements.  Ammonia is unremarkable.  CBC without leukocytosis or acute anemia.  TSH is unremarkable.  UA is unremarkable. ? ?Unclear etiology for patient's hallucinations and worsening weakness and confusion over the last couple days.  I will admit to medicine service for further evaluation and management. ? ? ? ? ?FINAL CLINICAL IMPRESSION(S) / ED DIAGNOSES  ? ?Final diagnoses:  ?Altered mental status, unspecified altered mental status type  ? ? ? ?Rx / DC Orders  ? ?ED Discharge Orders   ? ? None  ? ?  ? ? ? ?Note:  This document was prepared using Dragon voice recognition software and may include unintentional dictation errors. ?  ?Gilles Chiquito, MD ?08/25/21 1145 ? ?

## 2021-08-25 NOTE — ED Triage Notes (Signed)
Pt here with AMS. Pt was sent her by Dr. Jens Som due to his increased confusion. Pt has been getting CMT treatments and pt has been having hallucinations. Pt able to answer orientation questions but pt states that he feels weak and dizzy.  ?

## 2021-08-25 NOTE — Progress Notes (Signed)
Spoke with psychiatry team and they believe that the hallucinations or not secondary to the Seroquel and may be secondary to Parkinson's hallucination..  Will restart a lower dose Seroquel tonight and see what happens. ?

## 2021-08-25 NOTE — H&P (Signed)
?History and Physical  ? ? ?Patient: Ricky [name] DOB: 1955-08-04 ?DOA: 08/25/2021 ?DOS: the patient was seen and examined on 08/25/2021 ?PCP: Adin Hector, MD  ?Patient coming from: Home ? ?Chief Complaint:  ?Chief Complaint  ?Patient presents with  ? Altered Mental Status  ? ?HPI: Ricky [name] is a 66 y.o. male with medical history significant of major depression.  He recently had ECT treatment and had his Seroquel increased to 50 mg.  The patient has had very wild dreams and wakes up reaching out trying to grab things and seeing people and talking to people that are not there.  He is also very weak and almost had a fall.  The patient currently feels okay now.  Family concerned about his strength and his visual hallucinations.  The patient denies any auditory hallucinations.  Family states that they only happen when he wakes up from sleep. ? ?Review of Systems: Review of Systems  ?Constitutional:  Positive for malaise/fatigue. Negative for chills, fever and weight loss.  ?HENT:  Negative for sore throat.   ?Eyes:  Positive for blurred vision.  ?Respiratory:  Negative for cough and shortness of breath.   ?Cardiovascular:  Negative for chest pain.  ?Gastrointestinal:  Negative for abdominal pain, constipation, diarrhea, nausea and vomiting.  ?Genitourinary:  Negative for dysuria.  ?Musculoskeletal:  Negative for joint pain and myalgias.  ?Skin:  Negative for rash.  ?Neurological:  Negative for dizziness and focal weakness.  ?Psychiatric/Behavioral:  Positive for depression. Negative for suicidal ideas.    ?Past Medical History:  ?Diagnosis Date  ? Anxiety   ? Arthritis   ? Carpal tunnel syndrome   ? Depression   ? Diabetes ([place])   ? Hyperlipidemia   ? Hypertension   ? Loss of teeth due to extraction   ? Parkinson's disease ([place])   ? ?Past Surgical History:  ?Procedure Laterality Date  ? CARPAL TUNNEL RELEASE Right 11/28/2020  ? Procedure: CARPAL TUNNEL RELEASE ENDOSCOPIC;  Surgeon: Corky Mull, MD;   Location: [facility] ORS;  Service: Orthopedics;  Laterality: Right;  ? CARPAL TUNNEL RELEASE Left 01/22/2021  ? Procedure: CARPAL TUNNEL RELEASE ENDOSCOPIC;  Surgeon: Corky Mull, MD;  Location: [facility] ORS;  Service: Orthopedics;  Laterality: Left;  ? KNEE ARTHROSCOPY Right   ? ?Social History:  reports that he has never smoked. He has never used smokeless tobacco. He reports that he does not drink alcohol and does not use drugs. ? ?Allergies  ?Allergen Reactions  ? Mirtazapine Rash  ? ? ?Family History  ?Problem Relation Age of Onset  ? Diabetes Mother   ? Hypertension Mother   ? CVA Father   ? Heart attack Father   ? Depression Sister   ? Depression Brother   ? ? ?Prior to Admission medications   ?Medication Sig Start Date End Date Taking? Authorizing Provider  ?ARIPiprazole (ABILIFY) 10 MG tablet Take 10 mg by mouth daily.   Yes [provider]  ?atenolol (TENORMIN) 50 MG tablet Take 50 mg by mouth daily. 06/14/20  Yes [provider]  ?buPROPion (WELLBUTRIN XL) 150 MG 24 hr tablet Take 1 tablet (150 mg total) by mouth daily. Total of 450 mg daily. Take along with 300 mg tab 05/25/21 08/25/21 Yes Hisada, Elie Goody, MD  ?buPROPion (WELLBUTRIN XL) 300 MG 24 hr tablet Take 1 tablet (300 mg total) by mouth daily. Total of 450 mg daily. Take along with 150 mg tab 05/25/21 08/25/21 Yes Hisada, Elie Goody, MD  ?carbidopa-levodopa (SINEMET  IR) 25-100 MG tablet Take 1 tablet by mouth 5 (five) times daily. 12/30/20  Yes [provider]  ?chlorthalidone (HYGROTON) 25 MG tablet Take 25 mg by mouth daily. 08/12/20  Yes [provider]  ?DULoxetine (CYMBALTA) 30 MG capsule Take 3 capsules (90 mg total) by mouth daily. 05/02/21 08/25/21 Yes Hisada, Elie Goody, MD  ?enalapril (VASOTEC) 10 MG tablet Take 10 mg by mouth daily. 04/01/16  Yes [provider]  ?ketoconazole (NIZORAL) 2 % shampoo Apply 1 application topically every other day. 05/17/20  Yes [provider]  ?metFORMIN (GLUCOPHAGE) 500 MG tablet  Take 1,000 mg by mouth 2 (two) times daily with a meal. 03/10/21 03/05/22 Yes [provider]  ?QUEtiapine (SEROQUEL) 50 MG tablet Take 50 mg by mouth at bedtime.   Yes [provider]  ?rosuvastatin (CRESTOR) 20 MG tablet Take 20 mg by mouth daily. 04/21/21  Yes [provider]  ?traZODone (DESYREL) 50 MG tablet Take 50 mg by mouth at bedtime.   Yes [provider]  ?VICTOZA 18 MG/3ML SOPN Inject 1.2 mg into the skin daily. 07/31/20  Yes [provider]  ?clonazePAM (KLONOPIN) 1 MG tablet Take 1 mg by mouth in the morning, at noon, and at bedtime.    [provider]  ?sildenafil (VIAGRA) 50 MG tablet Take 50 mg by mouth daily as needed for erectile dysfunction.    [provider]  ? ? ?Physical Exam: ?Vitals:  ? 08/25/21 0952 08/25/21 1003 08/25/21 1100 08/25/21 1200  ?BP: 111/82  128/82 132/86  ?Pulse: 77  76 78  ?Resp: (!) 178  11 12  ?Temp:  97.6 ?F (36.4 ?C)    ?TempSrc:  Oral    ?SpO2: 100%  99% 99%  ?Weight: 83.9 kg     ?Height: 5\' 9"  (1.753 m)     ? ?Physical Exam ?HENT:  ?   Head: Normocephalic.  ?   Mouth/Throat:  ?   Pharynx: No oropharyngeal exudate.  ?Eyes:  ?   General: Lids are normal.  ?   Conjunctiva/sclera: Conjunctivae normal.  ?Cardiovascular:  ?   Rate and Rhythm: Normal rate and regular rhythm.  ?   Heart sounds: Normal heart sounds, S1 normal and S2 normal.  ?Pulmonary:  ?   Breath sounds: Normal breath sounds. No decreased breath sounds, wheezing, rhonchi or rales.  ?Abdominal:  ?   Palpations: Abdomen is soft.  ?   Tenderness: There is no abdominal tenderness.  ?Musculoskeletal:  ?   Right lower leg: No swelling.  ?   Left lower leg: No swelling.  ?Skin: ?   General: Skin is warm.  ?   Findings: No rash.  ?Neurological:  ?   Mental Status: He is alert.  ?   Comments: Answers questions appropriately.  Able to straight leg raise bilaterally.  Slight tremor right arm resting.  Power 5 out of 5 bilaterally.  ?  ?Data  Reviewed: ?Laboratory and radiological data reviewed.  Sodium of 129.  Creatinine 1.11.  CBC within normal limits.  TSH normal limits.  Urine analysis and chest x-ray negative.  CT scan of the head shows chronic small vessel ischemic disease. ? ?Assessment and Plan: ?1.  Visual hallucinations.  Patient recently had ECT treatment and increase in Seroquel dose.  We will get psychiatry evaluation.  Continue other psychiatric medications for now but hold Seroquel.  Since the patient having these hallucinations when he wakes up from sleep likely secondary to nighttime medications. ?2.  Hyponatremia.  We will give IV fluid bolus and gentle IV fluids.  Send off urine osmolarity and urine sodium.  Hold chlorthalidone. ?3.  Parkinson's disease continue Sinemet.  Check orthostatic vital signs. ?4.  Essential hypertension.  Holding blood pressure medications for now until checking orthostatic vital signs.  Hold chlorthalidone with hyponatremia.  May end up restarting one of the other 2 medications if blood pressure starts to rise. ?5.  Type 2 diabetes mellitus with hyperlipidemia.  We will have her on sliding scale.  Checking hemoglobin A1c.  Continue metformin.  Continue Crestor. ? ? ? Advance Care Planning:   Code Status: Full Code  ? ?Consults: Psychiatry ? ?Family Communication: Spoke with fianc? and mother at the bedside ? ?Severity of Illness: ?The appropriate patient status for this patient is OBSERVATION. Observation status is judged to be reasonable and necessary in order to provide the required intensity of service to ensure the patient's safety. The patient's presenting symptoms, physical exam findings, and initial radiographic and laboratory data in the context of their medical condition is felt to place them at decreased risk for further clinical deterioration. Furthermore, it is anticipated that the patient will be medically stable for discharge from the hospital within 2 midnights of admission.   ? ?Author: ?Loletha Grayer, MD ?08/25/2021 1:28 PM ? ?For on call review [email or web address].  ?

## 2021-08-26 ENCOUNTER — Observation Stay: Payer: 59

## 2021-08-26 DIAGNOSIS — R441 Visual hallucinations: Secondary | ICD-10-CM | POA: Diagnosis not present

## 2021-08-26 DIAGNOSIS — F329 Major depressive disorder, single episode, unspecified: Secondary | ICD-10-CM | POA: Diagnosis present

## 2021-08-26 DIAGNOSIS — F331 Major depressive disorder, recurrent, moderate: Secondary | ICD-10-CM | POA: Diagnosis not present

## 2021-08-26 DIAGNOSIS — I951 Orthostatic hypotension: Secondary | ICD-10-CM | POA: Diagnosis not present

## 2021-08-26 DIAGNOSIS — E871 Hypo-osmolality and hyponatremia: Secondary | ICD-10-CM | POA: Diagnosis not present

## 2021-08-26 LAB — GLUCOSE, CAPILLARY
Glucose-Capillary: 166 mg/dL — ABNORMAL HIGH (ref 70–99)
Glucose-Capillary: 126 mg/dL — ABNORMAL HIGH (ref 70–99)
Glucose-Capillary: 162 mg/dL — ABNORMAL HIGH (ref 70–99)

## 2021-08-26 LAB — URINE DRUG SCREEN, QUALITATIVE (ARMC ONLY)
Amphetamines, Ur Screen: NOT DETECTED
Barbiturates, Ur Screen: NOT DETECTED
Benzodiazepine, Ur Scrn: NOT DETECTED
Cannabinoid 50 Ng, Ur ~~LOC~~: NOT DETECTED
Cocaine Metabolite,Ur ~~LOC~~: NOT DETECTED
MDMA (Ecstasy)Ur Screen: NOT DETECTED
Methadone Scn, Ur: NOT DETECTED
Opiate, Ur Screen: NOT DETECTED
Phencyclidine (PCP) Ur S: NOT DETECTED
Tricyclic, Ur Screen: NOT DETECTED

## 2021-08-26 LAB — HIV ANTIBODY (ROUTINE TESTING W REFLEX): HIV Screen 4th Generation wRfx: NONREACTIVE

## 2021-08-26 LAB — CBC
HCT: 40.4 % (ref 39.0–52.0)
Hemoglobin: 14.4 g/dL (ref 13.0–17.0)
MCH: 29.9 pg (ref 26.0–34.0)
MCHC: 35.6 g/dL (ref 30.0–36.0)
MCV: 83.8 fL (ref 80.0–100.0)
Platelets: 141 10*3/uL — ABNORMAL LOW (ref 150–400)
RBC: 4.82 MIL/uL (ref 4.22–5.81)
RDW: 12 % (ref 11.5–15.5)
WBC: 6 10*3/uL (ref 4.0–10.5)
nRBC: 0 % (ref 0.0–0.2)

## 2021-08-26 LAB — BASIC METABOLIC PANEL
Anion gap: 10 (ref 5–15)
BUN: 13 mg/dL (ref 8–23)
CO2: 24 mmol/L (ref 22–32)
Calcium: 8.8 mg/dL — ABNORMAL LOW (ref 8.9–10.3)
Chloride: 91 mmol/L — ABNORMAL LOW (ref 98–111)
Creatinine, Ser: 0.81 mg/dL (ref 0.61–1.24)
GFR, Estimated: >60 mL/min (ref >60)
Glucose, Bld: 163 mg/dL — ABNORMAL HIGH (ref 70–99)
Potassium: 3.4 mmol/L — ABNORMAL LOW (ref 3.5–5.1)
Sodium: 125 mmol/L — ABNORMAL LOW (ref 135–145)

## 2021-08-26 LAB — SODIUM: Sodium: 128 mmol/L — ABNORMAL LOW (ref 135–145)

## 2021-08-26 LAB — CK: Total CK: 110 U/L (ref 49–397)

## 2021-08-26 LAB — VITAMIN B12: Vitamin B-12: 324 pg/mL (ref 180–914)

## 2021-08-26 MED ORDER — CLONAZEPAM 0.5 MG PO TABS
0.5000 mg | ORAL_TABLET | Freq: Three times a day (TID) | ORAL | Status: DC
  Administered 2021-08-26 – 2021-08-28 (×6): 0.5 mg via ORAL
  Filled 2021-08-26 (×6): qty 1

## 2021-08-26 MED ORDER — LORAZEPAM 2 MG/ML IJ SOLN
0.5000 mg | Freq: Once | INTRAMUSCULAR | Status: AC | PRN
  Administered 2021-08-26: 0.5 mg via INTRAVENOUS
  Filled 2021-08-26: qty 1

## 2021-08-26 MED ORDER — POTASSIUM CHLORIDE CRYS ER 20 MEQ PO TBCR
40.0000 meq | EXTENDED_RELEASE_TABLET | Freq: Once | ORAL | Status: AC
  Administered 2021-08-26: 40 meq via ORAL

## 2021-08-26 NOTE — Assessment & Plan Note (Addendum)
Psychiatry does not believe that these are secondary to the Seroquel dose increase.  Family still concerned about the Seroquel and would like to discontinue this medication at this time.  Suspect secondary to hyponatremia and sleep deprivation.  Resolved following improvement in sodium correction and sleep. ?

## 2021-08-26 NOTE — Assessment & Plan Note (Addendum)
Continue psychiatric medications.  Patient previously had been taking Klonopin 1 mg 3 times daily as needed.  Since hospitalization, he has been on 0.5 mg 3 times daily scheduled and is actually felt much better both in terms of mood, anxiety and sleep.  We will plan to recommend this change for patient.  He is already enrolled in cognitive therapy for the past 4 months. ?

## 2021-08-26 NOTE — Progress Notes (Signed)
?Progress Note ? ? ?Patient: Ricky [name] IZT:[id number] DOB: Jun 19, 1955 DOA: 08/25/2021     0 ?DOS: the patient was seen and examined on 08/26/2021 ?  ?Brief hospital course: ?66 year old man with history of major depression, Parkinson, diabetes, hyperlipidemia and hypertension.  He recently had ECT treatment and had a Seroquel increased to 50 mg.  He has been having some vivid dreams and waking up and having hallucinations with picking at things and talking to people that are not there.  Family very concerned and brought him in for further evaluation. ? ?Assessment and Plan: ?* Visual hallucinations ?Psychiatry does not believe that these are secondary to the Seroquel dose increase.  Family still concerned about the Seroquel and would like to discontinue this medication at this time. ? ?Hyponatremia ?With orthostatic hypotension I did give IV fluids yesterday.  These were discontinued with sodium dropping down from 129 down to 125.  I believe the low sodium is secondary to chlorthalidone.  We will give fluid restriction.  Continue to hold chlorthalidone. ? ?Orthostatic hypotension ?Continue to hold antihypertensive medications.  Given fluid bolus yesterday.  Awaiting for orthostatic vital signs today.  The patient did have a witnessed fall while getting from the bed to the chair.  Did not hit his head. ? ?Type 2 diabetes mellitus with hyperlipidemia ([facility]) ?Hemoglobin A1c low at 5.2.  Continue to monitor sugars.  We will discontinue metformin.  Continue Crestor. ? ?Major depression ?Continue psychiatric medications.  Decrease dose of clonazepam down to 0.5 mg 3 times a day. ? ?Parkinson's disease ([facility]) ?Continue Sinemet ? ? ? ? ?  ? ?Subjective: Patient feeling okay wants to get out of the hospital soon as possible.  Patient was agreeable for further lab testing and seeing how tonight goes again.  Patient's wife states that he had another rough night last night with confusion. ? ?Physical Exam: ?Vitals:  ? 08/25/21  2258 08/26/21 0209 08/26/21 0804 08/26/21 1341  ?BP: 122/78 113/79 137/86 118/87  ?Pulse: 92 80 79 98  ?Resp: 15 16 18 18   ?Temp: 98.5 ?F (36.9 ?C) 97.8 ?F (36.6 ?C) 97.8 ?F (36.6 ?C) 98 ?F (36.7 ?C)  ?TempSrc: Oral Oral  Oral  ?SpO2: 97% 98% 100%   ?Weight:      ?Height:      ? ?Physical Exam ?HENT:  ?   Head: Normocephalic.  ?   Mouth/Throat:  ?   Pharynx: No oropharyngeal exudate.  ?Eyes:  ?   General: Lids are normal.  ?   Conjunctiva/sclera: Conjunctivae normal.  ?Cardiovascular:  ?   Rate and Rhythm: Normal rate and regular rhythm.  ?   Heart sounds: Normal heart sounds, S1 normal and S2 normal.  ?Pulmonary:  ?   Breath sounds: Normal breath sounds. No decreased breath sounds, wheezing, rhonchi or rales.  ?Abdominal:  ?   Palpations: Abdomen is soft.  ?   Tenderness: There is no abdominal tenderness.  ?Musculoskeletal:  ?   Right lower leg: No swelling.  ?   Left lower leg: No swelling.  ?Skin: ?   General: Skin is warm.  ?   Findings: No rash.  ?Neurological:  ?   Mental Status: He is alert.  ?   Comments: Answers questions appropriately.  Able to straight leg raise bilaterally.  Slight tremor right arm resting.  Power 5 out of 5 bilaterally.  ?  ?Data Reviewed: ?Sodium dropped down to 125, creatinine 0.81, potassium 3.4, platelet count 141, hemoglobin A1c 5.2 ?MRI of the brain  showed no acute intracranial abnormality ? ?Family Communication: Updated patient's wife on the phone ? ?Disposition: ?Status is: Observation ?With sodium dropping down to 125 will observe overnight again.  Afternoon labs not drawn yet.  Check RPR TSH and vitamin B12. ? ?Planned Discharge Destination: Home with Home Health ? ? ?Author: ?Alford Highland, MD ?08/26/2021 3:46 PM ? ?For on call review [email or web address].  ?

## 2021-08-26 NOTE — Assessment & Plan Note (Addendum)
Resolved.  Better following initial fluid hydration. ?

## 2021-08-26 NOTE — Hospital Course (Addendum)
66 year old man with history of major depression, Parkinson, diabetes, hyperlipidemia and hypertension.  He recently had ECT treatment and had a Seroquel increased to 50 mg.  He has been having some vivid dreams and waking up and having hallucinations with picking at things and talking to people that are not there.  Patient was brought in to the emergency room on 4/17.  He was found to have a sodium of 129 initially, but by following morning, had dropped to 125 after receiving IV fluids..  This was felt to be medication related and chlorthalidone held.  Patient placed on fluid restriction and since then his sodium has trended up well, and by day of discharge at 130. ?

## 2021-08-26 NOTE — Progress Notes (Signed)
Met with the patient in the room, ?He lives at home with his spouse ?He stated that he has a rolling walker at home ?I explained that [facility] was recommended however non of the agencies I work with are INN with his Insurance, I asked if he would like for me to set up Outpatient aPT, he stated that he did not feel he would need it, I explained that if he changes his mind then we can set up or he can have his PCP set nit up ? ?He stated understanding ?TOC will monitor for needs ?

## 2021-08-26 NOTE — Assessment & Plan Note (Addendum)
Hemoglobin A1c low at 5.2.  Continue to monitor sugars.  We will discontinue metformin.  Continue Crestor. ?

## 2021-08-26 NOTE — Assessment & Plan Note (Addendum)
With orthostatic hypotension I did give IV fluids yesterday.  These were discontinued with sodium dropping down from 129 down to 125.  I believe the low sodium is secondary to chlorthalidone.  Responded to fluid restriction and his sodium trending back upward, currently at 130 by day of discharge.  Will recommend patient discontinue chlorthalidone and stay on fluid restriction ?

## 2021-08-26 NOTE — Assessment & Plan Note (Signed)
Continue Sinemet ?

## 2021-08-26 NOTE — Plan of Care (Signed)

## 2021-08-27 DIAGNOSIS — E663 Overweight: Secondary | ICD-10-CM | POA: Diagnosis not present

## 2021-08-27 DIAGNOSIS — R441 Visual hallucinations: Secondary | ICD-10-CM | POA: Diagnosis not present

## 2021-08-27 DIAGNOSIS — F331 Major depressive disorder, recurrent, moderate: Secondary | ICD-10-CM | POA: Diagnosis not present

## 2021-08-27 DIAGNOSIS — E871 Hypo-osmolality and hyponatremia: Secondary | ICD-10-CM | POA: Diagnosis not present

## 2021-08-27 LAB — CBC
HCT: 41.2 % (ref 39.0–52.0)
Hemoglobin: 14.5 g/dL (ref 13.0–17.0)
MCH: 29.4 pg (ref 26.0–34.0)
MCHC: 35.2 g/dL (ref 30.0–36.0)
MCV: 83.6 fL (ref 80.0–100.0)
Platelets: 161 10*3/uL (ref 150–400)
RBC: 4.93 MIL/uL (ref 4.22–5.81)
RDW: 11.8 % (ref 11.5–15.5)
WBC: 8.6 10*3/uL (ref 4.0–10.5)
nRBC: 0 % (ref 0.0–0.2)

## 2021-08-27 LAB — BASIC METABOLIC PANEL
Anion gap: 9 (ref 5–15)
BUN: 14 mg/dL (ref 8–23)
CO2: 27 mmol/L (ref 22–32)
Calcium: 9.1 mg/dL (ref 8.9–10.3)
Chloride: 93 mmol/L — ABNORMAL LOW (ref 98–111)
Creatinine, Ser: 0.75 mg/dL (ref 0.61–1.24)
GFR, Estimated: >60 mL/min (ref >60)
Glucose, Bld: 142 mg/dL — ABNORMAL HIGH (ref 70–99)
Potassium: 3.6 mmol/L (ref 3.5–5.1)
Sodium: 129 mmol/L — ABNORMAL LOW (ref 135–145)

## 2021-08-27 LAB — GLUCOSE, CAPILLARY
Glucose-Capillary: 138 mg/dL — ABNORMAL HIGH (ref 70–99)
Glucose-Capillary: 174 mg/dL — ABNORMAL HIGH (ref 70–99)
Glucose-Capillary: 143 mg/dL — ABNORMAL HIGH (ref 70–99)
Glucose-Capillary: 159 mg/dL — ABNORMAL HIGH (ref 70–99)

## 2021-08-27 LAB — RPR: RPR Ser Ql: NONREACTIVE

## 2021-08-27 NOTE — Assessment & Plan Note (Signed)
Meets criteria BMI greater than 25 

## 2021-08-27 NOTE — Progress Notes (Signed)
[facility] Progress Note ? ?Patient: Ricky [name]    [id number]  DOA: 08/25/2021    ?Date of Service: the patient was seen and examined on 08/27/2021 ? ?Brief hospital course: ?66 year old man with history of major depression, Parkinson, diabetes, hyperlipidemia and hypertension.  He recently had ECT treatment and had a Seroquel increased to 50 mg.  He has been having some vivid dreams and waking up and having hallucinations with picking at things and talking to people that are not there.  Patient was brought in to the emergency room on 4/17.  He was found to have a sodium of 129 initially, but by following morning, had dropped to 125 after receiving IV fluids..  This was felt to be medication related and chlorthalidone held.  Patient placed on fluid restriction and since then his sodium has been trending up nicely, today at 129. ? ?Assessment and Plan: ?Assessment and Plan: ?* Visual hallucinations ?Psychiatry does not believe that these are secondary to the Seroquel dose increase.  Family still concerned about the Seroquel and would like to discontinue this medication at this time.  Suspect secondary to hyponatremia and sleep deprivation ? ?Hyponatremia ?With orthostatic hypotension I did give IV fluids yesterday.  These were discontinued with sodium dropping down from 129 down to 125.  I believe the low sodium is secondary to chlorthalidone.  Responded to fluid restriction and his sodium trending back upward, currently at 129.  Continue to hold chlorthalidone. ? ?Orthostatic hypotension ?Continue to hold antihypertensive medications.  Given fluid bolus yesterday.  Awaiting for orthostatic vital signs today.  The patient did have a witnessed fall while getting from the bed to the chair.  Did not hit his head. ? ?Type 2 diabetes mellitus with hyperlipidemia ([place]) ?Hemoglobin A1c low at 5.2.  Continue to monitor sugars.  We will discontinue metformin.  Continue Crestor. ? ?Parkinson's disease  ([place]) ?Continue Sinemet ? ?Major depression ?Continue psychiatric medications.  Decrease dose of clonazepam down to 0.5 mg 3 times a day. ? ?Overweight (BMI 25.0-29.9) ?Meets criteria BMI greater than 25 ? ? ? ? ? ? ?Body mass index is 27.32 kg/m?.  ?  ?   ? ?Consultants: ?Psychiatry ? ?Procedures: ?None ? ?Antimicrobials: ?None ? ?Code Status: Full code ? ? ?Subjective: Patient doing okay, complains of being tired ? ?Objective: ?Vital signs were reviewed and unremarkable. ?Vitals:  ? 08/27/21 1219 08/27/21 1616  ?BP: 124/79 126/89  ?Pulse: 73 79  ?Resp: 16 18  ?Temp:  97.9 ?F (36.6 ?C)  ?SpO2: 99% 94%  ? ? ?Intake/Output Summary (Last 24 hours) at 08/27/2021 1629 ?Last data filed at 08/27/2021 1418 ?Gross per 24 hour  ?Intake 720 ml  ?Output --  ?Net 720 ml  ? ?Filed Weights  ? 08/25/21 0952  ?Weight: 83.9 kg  ? ?Body mass index is 27.32 kg/m?. ? ?Exam: ? ?General: Alert and oriented x1-2, no acute distress ?HEENT: Normocephalic, atraumatic, mucous membranes are moist ?Cardiovascular: Regular rate and rhythm, S1-S2 ?Respiratory: Lungs: Clear to auscultation bilaterally ?Abdomen: Soft, nontender, nondistended, positive bowel sounds  ?Extremities: No clubbing or cyanosis or edema  ?skin: No skin breaks, tears or lesions ?Psychiatry: Still slightly confused although improved ?Neurology: No focal deficits ? ?Data Reviewed: ?Improving sodium to 129 ? ?Disposition:  ?Status is: Observation ? ?  ? ?Anticipated discharge date: 4/20 ? ?Remaining issues to be resolved so that patient can be discharged: Improving sodium and better mentation ? ? ?Family Communication: Wife at the bedside ?DVT Prophylaxis: ?enoxaparin (LOVENOX) injection  40 mg Start: 08/25/21 2200 ? ? ? ?Author: ?Annita Brod ,MD ?08/27/2021 4:29 PM ? ?To reach On-call, see care teams to locate the attending and reach out via [email or web address]. ?Between 7PM-7AM, please contact night-coverage ?If you still have difficulty reaching the attending provider, please  page the [facility] (Director on Call) for [facility] on amion for assistance. ? ?

## 2021-08-27 NOTE — Progress Notes (Signed)
Pt laying in bed with parents at bedside. Pt was reaching for his father when he attempted to transfer from the bed to the chair and missed the chair. Alarms on for bed, vss were obtained, MD notified pt did not hit his head or sustain and injures. Pt then gotten back into bed form floor max assist. ?

## 2021-08-27 NOTE — Progress Notes (Signed)
Physical Therapy Treatment ?Patient Details ?Name: Ricky [name] ?MRN: [id number] ?DOB: [birth year] ?Today's Date: 08/27/2021 ? ? ?History of Present Illness presented to ER secondary to weakness, AMS, visual hallucinations; CTH negative for acute changes. ? ?  ?PT Comments  ? ? Patient received in bed, sitter and fiance present. Sitter reports he got up to bathroom with her assist. Patient in bed in just underwear. He is agreeable to getting up and sitting in recliner for lunch. Patient is independent with bed mobility and performed sit to stand with supervision. He ambulated in room 35 feet without ad, no lob. Patient will continue to benefit from skilled PT while here to improve safety and independence.  ?     ?Recommendations for follow up therapy are one component of a multi-disciplinary discharge planning process, led by the attending physician.  Recommendations may be updated based on patient status, additional functional criteria and insurance authorization. ? ?Follow Up Recommendations ? Home health PT ?  ?  ?Assistance Recommended at Discharge Intermittent Supervision/Assistance  ?Patient can return home with the following A little help with walking and/or transfers;A little help with bathing/dressing/bathroom ?  ?Equipment Recommendations ? Rolling walker (2 wheels)  ?  ?Recommendations for Other Services   ? ? ?  ?Precautions / Restrictions Precautions ?Precautions: Fall ?Restrictions ?Weight Bearing Restrictions: No  ?  ? ?Mobility ? Bed Mobility ?Overal bed mobility: Independent ?  ?  ?  ?  ?  ?  ?  ?  ? ?Transfers ?Overall transfer level: Modified independent ?Equipment used: None ?Transfers: Sit to/from Stand ?Sit to Stand: Supervision ?  ?  ?  ?  ?  ?  ?  ? ?Ambulation/Gait ?Ambulation/Gait assistance: Min guard ?Gait Distance (Feet): 35 Feet ?Assistive device: None ?Gait Pattern/deviations: Step-through pattern ?Gait velocity: decr ?  ?  ?General Gait Details: patient ambulated in room, sitter states he  just got back in bed from bathroom with her assist. He did not use AD, no significant lob with room mobility. Patient left in recliner with alarm set, fiance in room. ? ? ?Stairs ?  ?  ?  ?  ?  ? ? ?Wheelchair Mobility ?  ? ?Modified Rankin (Stroke Patients Only) ?  ? ? ?  ?Balance Overall balance assessment: Mild deficits observed, not formally tested ?Sitting-balance support: Feet supported ?Sitting balance-Leahy Scale: Good ?  ?  ?Standing balance support: No upper extremity supported, During functional activity ?Standing balance-Leahy Scale: Good ?Standing balance comment: no lob with mobility in room ?  ?  ?  ?  ?  ?  ?  ?  ?  ?  ?  ?  ? ?  ?Cognition Arousal/Alertness: Awake/alert ?Behavior During Therapy: [facility] for tasks assessed/performed ?Overall Cognitive Status: Within Functional Limits for tasks assessed ?  ?  ?  ?  ?  ?  ?  ?  ?  ?  ?  ?  ?  ?  ?  ?  ?  ?  ?  ? ?  ?Exercises   ? ?  ?General Comments   ?  ?  ? ?Pertinent Vitals/Pain Pain Assessment ?Pain Assessment: No/denies pain  ? ? ?Home Living   ?  ?  ?  ?  ?  ?  ?  ?  ?  ?   ?  ?Prior Function    ?  ?  ?   ? ?PT Goals (current goals can now be found in the care plan section) Acute Rehab PT Goals ?  Patient Stated Goal: to return home ?PT Goal Formulation: With patient ?Time For Goal Achievement: 09/08/21 ?Potential to Achieve Goals: Good ?Progress towards PT goals: Progressing toward goals ? ?  ?Frequency ? ? ? Min 2X/week ? ? ? ?  ?PT Plan Current plan remains appropriate  ? ? ?Co-evaluation   ?  ?  ?  ?  ? ?  ?AM-PAC PT "6 Clicks" Mobility   ?Outcome Measure ? Help needed turning from your back to your side while in a flat bed without using bedrails?: None ?Help needed moving from lying on your back to sitting on the side of a flat bed without using bedrails?: None ?Help needed moving to and from a bed to a chair (including a wheelchair)?: A Little ?Help needed standing up from a chair using your arms (e.g., wheelchair or bedside chair)?: A  Little ?Help needed to walk in hospital room?: A Little ?Help needed climbing 3-5 steps with a railing? : A Little ?6 Click Score: 20 ? ?  ?End of Session   ?Activity Tolerance: Patient tolerated treatment well ?Patient left: in chair;with family/visitor present;with chair alarm set ?Nurse Communication: Mobility status ?PT Visit Diagnosis: Muscle weakness (generalized) (M62.81) ?  ? ? ?Time: 1696-7893 ?PT Time Calculation (min) (ACUTE ONLY): 12 min ? ?Charges:  $Gait Training: 8-22 mins          ?          ? ?[employer], PT, GCS ?08/27/21,11:50 AM ? ?

## 2021-08-27 NOTE — Plan of Care (Signed)

## 2021-08-28 DIAGNOSIS — R441 Visual hallucinations: Secondary | ICD-10-CM | POA: Diagnosis not present

## 2021-08-28 DIAGNOSIS — I951 Orthostatic hypotension: Secondary | ICD-10-CM | POA: Diagnosis not present

## 2021-08-28 DIAGNOSIS — E871 Hypo-osmolality and hyponatremia: Secondary | ICD-10-CM | POA: Diagnosis not present

## 2021-08-28 DIAGNOSIS — G20 Parkinson's disease: Secondary | ICD-10-CM | POA: Diagnosis not present

## 2021-08-28 LAB — BASIC METABOLIC PANEL
Anion gap: 7 (ref 5–15)
BUN: 13 mg/dL (ref 8–23)
CO2: 29 mmol/L (ref 22–32)
Calcium: 9.3 mg/dL (ref 8.9–10.3)
Chloride: 94 mmol/L — ABNORMAL LOW (ref 98–111)
Creatinine, Ser: 0.82 mg/dL (ref 0.61–1.24)
GFR, Estimated: >60 mL/min (ref >60)
Glucose, Bld: 191 mg/dL — ABNORMAL HIGH (ref 70–99)
Potassium: 4 mmol/L (ref 3.5–5.1)
Sodium: 130 mmol/L — ABNORMAL LOW (ref 135–145)

## 2021-08-28 LAB — GLUCOSE, CAPILLARY
Glucose-Capillary: 167 mg/dL — ABNORMAL HIGH (ref 70–99)
Glucose-Capillary: 179 mg/dL — ABNORMAL HIGH (ref 70–99)

## 2021-08-28 MED ORDER — CLONAZEPAM 1 MG PO TABS: 0.5000 mg | ORAL_TABLET | Freq: Three times a day (TID) | ORAL | 0 refills | Status: DC

## 2021-08-28 NOTE — Progress Notes (Signed)
Blood pressure 119/78, pulse 77, temperature 97.8 ?F (36.6 ?C), resp. rate 16, height 5\' 9"  (1.753 m), weight 83.9 kg, SpO2 97 %. ?IV removed site c/d/I discussed d/c packet with pt and wife pt d/c via w/c down to private car.  ?

## 2021-08-28 NOTE — Discharge Summary (Signed)
?Physician Discharge Summary ?  ?Patient: Ricky [name] MRN: [id number] DOB: [birth year]  ?Admit date:     08/25/2021  ?Discharge date: 08/28/21  ?Discharge Physician: Hollice Espy  ? ?PCP: [name], MD  ? ?Recommendations at discharge:  ? ?Medication change: Metformin discontinued ?Medication change: Chlorthalidone discontinued ?Medication change: Seroquel discontinued ?Medication change: Klonopin decreased from 1 mg p.o. 3 times daily as needed to 0.5 mg p.o. 3 times daily scheduled ? ?Discharge Diagnoses: ?Active Problems: ?  Hyponatremia ?  Type 2 diabetes mellitus with hyperlipidemia (HCC) ?  Parkinson's disease (HCC) ?  Major depression ?  Overweight (BMI 25.0-29.9) ? ?Principal Problem (Resolved): ?  Visual hallucinations ?Resolved Problems: ?  Orthostatic hypotension ? ?Hospital Course: ?66 year old man with history of major depression, Parkinson, diabetes, hyperlipidemia and hypertension.  He recently had ECT treatment and had a Seroquel increased to 50 mg.  He has been having some vivid dreams and waking up and having hallucinations with picking at things and talking to people that are not there.  Patient was brought in to the emergency room on 4/17.  He was found to have a sodium of 129 initially, but by following morning, had dropped to 125 after receiving IV fluids..  This was felt to be medication related and chlorthalidone held.  Patient placed on fluid restriction and since then his sodium has trended up well, and by day of discharge at 130. ? ?Assessment and Plan: ?* Visual hallucinations-resolved as of 08/28/2021 ?Psychiatry does not believe that these are secondary to the Seroquel dose increase.  Family still concerned about the Seroquel and would like to discontinue this medication at this time.  Suspect secondary to hyponatremia and sleep deprivation.  Resolved following improvement in sodium correction and sleep. ? ?Hyponatremia ?With orthostatic hypotension I did give IV fluids  yesterday.  These were discontinued with sodium dropping down from 129 down to 125.  I believe the low sodium is secondary to chlorthalidone.  Responded to fluid restriction and his sodium trending back upward, currently at 130 by day of discharge.  Will recommend patient discontinue chlorthalidone and stay on fluid restriction ? ?Orthostatic hypotension-resolved as of 08/28/2021 ?Resolved.  Better following initial fluid hydration. ? ?Type 2 diabetes mellitus with hyperlipidemia (HCC) ?Hemoglobin A1c low at 5.2.  Continue to monitor sugars.  We will discontinue metformin.  Continue Crestor. ? ?Parkinson's disease (HCC) ?Continue Sinemet ? ?Major depression ?Continue psychiatric medications.  Patient previously had been taking Klonopin 1 mg 3 times daily as needed.  Since hospitalization, he has been on 0.5 mg 3 times daily scheduled and is actually felt much better both in terms of mood, anxiety and sleep.  We will plan to recommend this change for patient.  He is already enrolled in cognitive therapy for the past 4 months. ? ?Overweight (BMI 25.0-29.9) ?Meets criteria BMI greater than 25 ? ? ? ? ?  ? ? ?Consultants: None ?Procedures performed: None ?Disposition: Home ?Diet recommendation:  ?Discharge Diet Orders (From admission, onward)  ? ?  Start     Ordered  ? 08/28/21 0000  Diet - low sodium heart healthy       ? 08/28/21 1115  ? ?  ?  ? ?  ? ?Carb modified diet ?DISCHARGE MEDICATION: ?Allergies as of 08/28/2021   ? ?   Reactions  ? Mirtazapine Rash  ? ?  ? ?  ?Medication List  ?  ? ?STOP taking these medications   ? ?chlorthalidone 25 MG tablet ?Commonly  known as: HYGROTON ?  ?metFORMIN 500 MG tablet ?Commonly known as: GLUCOPHAGE ?  ?QUEtiapine 50 MG tablet ?Commonly known as: SEROQUEL ?  ? ?  ? ?TAKE these medications   ? ?ARIPiprazole 10 MG tablet ?Commonly known as: ABILIFY ?Take 10 mg by mouth daily. ?  ?atenolol 50 MG tablet ?Commonly known as: TENORMIN ?Take 50 mg by mouth daily. ?  ?buPROPion 300 MG 24  hr tablet ?Commonly known as: WELLBUTRIN XL ?Take 1 tablet (300 mg total) by mouth daily. Total of 450 mg daily. Take along with 150 mg tab ?  ?buPROPion 150 MG 24 hr tablet ?Commonly known as: WELLBUTRIN XL ?Take 1 tablet (150 mg total) by mouth daily. Total of 450 mg daily. Take along with 300 mg tab ?  ?carbidopa-levodopa 25-100 MG tablet ?Commonly known as: SINEMET IR ?Take 1 tablet by mouth 5 (five) times daily. ?  ?clonazePAM 1 MG tablet ?Commonly known as: KLONOPIN ?Take 0.5 tablets (0.5 mg total) by mouth in the morning, at noon, and at bedtime. ?What changed: how much to take ?  ?DULoxetine 30 MG capsule ?Commonly known as: CYMBALTA ?Take 3 capsules (90 mg total) by mouth daily. ?  ?enalapril 10 MG tablet ?Commonly known as: VASOTEC ?Take 10 mg by mouth daily. ?  ?ketoconazole 2 % shampoo ?Commonly known as: NIZORAL ?Apply 1 application topically every other day. ?  ?rosuvastatin 20 MG tablet ?Commonly known as: CRESTOR ?Take 20 mg by mouth daily. ?  ?sildenafil 50 MG tablet ?Commonly known as: VIAGRA ?Take 50 mg by mouth daily as needed for erectile dysfunction. ?  ?traZODone 50 MG tablet ?Commonly known as: DESYREL ?Take 50 mg by mouth at bedtime. ?  ?Victoza 18 MG/3ML Sopn ?Generic drug: liraglutide ?Inject 1.2 mg into the skin daily. ?  ? ?  ? ? ?Discharge Exam: ?Filed Weights  ? 08/25/21 0952  ?Weight: 83.9 kg  ? ?General: Alert and oriented x3, no acute distress ?Cardiovascular: Regular rate and rhythm, S1-S2 ?Lungs: Clear to auscultation bilaterally ? ?Condition at discharge: good ? ?The results of significant diagnostics from this hospitalization (including imaging, microbiology, ancillary and laboratory) are listed below for reference.  ? ?Imaging Studies: ?CT HEAD WO CONTRAST (5MM) ? ?Result Date: 08/25/2021 ?CLINICAL DATA:  Mental status change, unknown cause. EXAM: CT HEAD WITHOUT CONTRAST TECHNIQUE: Contiguous axial images were obtained from the base of the skull through the vertex without  intravenous contrast. RADIATION DOSE REDUCTION: This exam was performed according to the departmental dose-optimization program which includes automated exposure control, adjustment of the mA and/or kV according to patient size and/or use of iterative reconstruction technique. COMPARISON:  No pertinent prior exams available for comparison. FINDINGS: Brain: Mild generalized cerebral atrophy. Minimal patchy and ill-defined hypoattenuation within the cerebral white matter, nonspecific but compatible with chronic small vessel ischemic disease. Small hypodense foci within the bilateral basal ganglia, which may reflect prominent perivascular spaces and/or chronic lacunar infarcts. There is no acute intracranial hemorrhage. No demarcated cortical infarct. No extra-axial fluid collection. No evidence of an intracranial mass. No midline shift. Vascular: No hyperdense vessel. Skull: Normal. Negative for fracture or focal lesion. Sinuses/Orbits: Visualized orbits show no acute finding. Minimal mucosal thickening within a left ethmoid air cell. IMPRESSION: No evidence of acute intracranial abnormality. Mild chronic small vessel ischemic disease. Mild generalized cerebral atrophy. Electronically Signed   By: Jackey LogeKyle  Golden D.O.   On: 08/25/2021 10:22  ? ?MR BRAIN WO CONTRAST ? ?Result Date: 08/26/2021 ?CLINICAL DATA:  Mental status change,  unknown cause.  Weakness. EXAM: MRI HEAD WITHOUT CONTRAST TECHNIQUE: Multiplanar, multiecho pulse sequences of the brain and surrounding structures were obtained without intravenous contrast. COMPARISON:  Head CT 08/25/2021 FINDINGS: Brain: There is no evidence of an acute infarct, intracranial hemorrhage, mass, midline shift, or extra-axial fluid collection. Small T2 hyperintensities in the cerebral white matter bilaterally are nonspecific but compatible with mild chronic small vessel ischemic disease. Dilated perivascular spaces are noted in the basal ganglia bilaterally. There is mild  generalized cerebral atrophy. Vascular: Major intracranial vascular flow voids are preserved. Skull and upper cervical spine: Unremarkable bone marrow signal. Sinuses/Orbits: Unremarkable orbits. Paranasal sinuses and ma

## 2021-08-28 NOTE — Plan of Care (Signed)

## 2021-09-16 ENCOUNTER — Other Ambulatory Visit: Payer: Self-pay | Admitting: Psychiatry

## 2021-09-16 ENCOUNTER — Telehealth: Payer: Self-pay

## 2021-09-16 MED ORDER — BUPROPION HCL ER (XL) 300 MG PO TB24
300.0000 mg | ORAL_TABLET | Freq: Every day | ORAL | 0 refills | Status: DC
Start: 2021-09-16 — End: 2021-12-22

## 2021-09-16 MED ORDER — BUPROPION HCL ER (XL) 150 MG PO TB24: 150.0000 mg | ORAL_TABLET | Freq: Every day | ORAL | 0 refills | Status: DC

## 2021-09-16 NOTE — Progress Notes (Signed)
BH MD/PA/NP OP Progress Note ? ?09/18/2021 10:34 AM ?Ricky [name]  ?MRN:  [id number] ? ?Chief Complaint:  ?Chief Complaint  ?Patient presents with  ? Follow-up  ? ?HPI:  ?- He was admitted for hallucinations in the context of hyponatremia (125), r/o secondary to chlorthalidone. The following medication changes were done.  ?Medication change: Metformin discontinued ?Medication change: Chlorthalidone discontinued ?Medication change: Seroquel discontinued ?Medication change: Klonopin decreased from 1 mg p.o. 3 times daily as needed to 0.5 mg p.o. 3 times daily scheduled ? ?This is a follow-up appointment for depression and insomnia.  ?The majority of the history was obtained from Ricky [name], his wife.  ?She states that he was argumentative and had hallucinations prior to admission. She talked about an episode of him, being dressed at 5 am, asking her "why don't you ready for the appointment."  He has not had any episodes of confusions, hallucinations since discharge except that he needs to check the day of the week at times.  However, he has been more anxious since being lowered dose of clonazepam.  She states that he wants to go out more when he takes clonazepam.  He has initial and middle insomnia.  He sleeps well.  Although he has depressive symptoms as in PHQ-9, he thinks his mood has been better.  Both the patient and his wife wants to try higher dose of clonazepam. Ricky [name], his wife agrees that she will contact the office if any worsening in confusion. They both agreed with the plans below. ? ?Wt Readings from Last 3 Encounters:  ?09/18/21 184 lb 12.8 oz (83.8 kg)  ?08/25/21 185 lb (83.9 kg)  ?08/11/21 180 lb (81.6 kg)  ?  ? ?Visit Diagnosis:  ?  ICD-10-CM   ?1. Hyponatremia  E87.1 Basic metabolic panel  ?  ?2. MDD (major depressive disorder), recurrent episode, moderate (HCC)  F33.1   ?  ?3. Anxiety state  F41.1   ?  ?4. Insomnia, unspecified type  G47.00   ?  ? ? ?Past Psychiatric History: Please see initial evaluation  for full details. I have reviewed the history. No updates at this time.  ?  ? ?Past Medical History:  ?Past Medical History:  ?Diagnosis Date  ? Anxiety   ? Arthritis   ? Carpal tunnel syndrome   ? Depression   ? Diabetes (HCC)   ? Hyperlipidemia   ? Hypertension   ? Loss of teeth due to extraction   ? Parkinson's disease (HCC)   ?  ?Past Surgical History:  ?Procedure Laterality Date  ? CARPAL TUNNEL RELEASE Right 11/28/2020  ? Procedure: CARPAL TUNNEL RELEASE ENDOSCOPIC;  Surgeon: Christena FlakePoggi, John J, MD;  Location: [facility] OR[facility];  Service: Orthopedics;  Laterality: Right;  ? CARPAL TUNNEL RELEASE Left 01/22/2021  ? Procedure: CARPAL TUNNEL RELEASE ENDOSCOPIC;  Surgeon: Christena FlakePoggi, John J, MD;  Location: [facility] OR[facility];  Service: Orthopedics;  Laterality: Left;  ? KNEE ARTHROSCOPY Right   ? ? ?Family Psychiatric History: Please see initial evaluation for full details. I have reviewed the history. No updates at this time.  ?  ? ?Family History:  ?Family History  ?Problem Relation Age of Onset  ? Diabetes Mother   ? Hypertension Mother   ? CVA Father   ? Heart attack Father   ? Depression Sister   ? Depression Brother   ? ? ?Social History:  ?Social History  ? ?Socioeconomic History  ? Marital status: Single  ?  Spouse name: Ricky [name]  ? Number of children: Not on  file  ? Years of education: Not on file  ? Highest education level: Not on file  ?Occupational History  ? Not on file  ?Tobacco Use  ? Smoking status: Never  ? Smokeless tobacco: Never  ?Vaping Use  ? Vaping Use: Never used  ?Substance and Sexual Activity  ? Alcohol use: Never  ? Drug use: Never  ? Sexual activity: Not on file  ?Other Topics Concern  ? Not on file  ?Social History Narrative  ? Lives with friend  ? ?Social Determinants of Health  ? ?Financial Resource Strain: Not on file  ?Food Insecurity: Not on file  ?Transportation Needs: Not on file  ?Physical Activity: Not on file  ?Stress: Not on file  ?Social Connections: Not on file  ? ? ?Allergies:  ?Allergies  ?Allergen  Reactions  ? Mirtazapine Rash  ? ? ?Metabolic Disorder Labs: ?Lab Results  ?Component Value Date  ? HGBA1C 5.2 08/25/2021  ? MPG 102.54 08/25/2021  ? ?No results found for: PROLACTIN ?No results found for: CHOL, TRIG, HDL, CHOLHDL, VLDL, LDLCALC ?Lab Results  ?Component Value Date  ? TSH 3.886 08/25/2021  ? ? ?Therapeutic Level Labs: ?No results found for: LITHIUM ?No results found for: VALPROATE ?No components found for:  CBMZ ? ?Current Medications: ?Current Outpatient Medications  ?Medication Sig Dispense Refill  ? clonazePAM (KLONOPIN) 1 MG tablet Take 1 tablet (1 mg total) by mouth 2 (two) times daily. 60 tablet 1  ? ARIPiprazole (ABILIFY) 10 MG tablet Take 10 mg by mouth daily.    ? atenolol (TENORMIN) 50 MG tablet Take 50 mg by mouth daily.    ? buPROPion (WELLBUTRIN XL) 150 MG 24 hr tablet Take 1 tablet (150 mg total) by mouth daily. Total of 450 mg daily. Take along with 300 mg tab 90 tablet 0  ? buPROPion (WELLBUTRIN XL) 300 MG 24 hr tablet Take 1 tablet (300 mg total) by mouth daily. Total of 450 mg daily. Take along with 150 mg tab 90 tablet 0  ? carbidopa-levodopa (SINEMET IR) 25-100 MG tablet Take 1 tablet by mouth 5 (five) times daily.    ? clonazePAM (KLONOPIN) 1 MG tablet Take 0.5 tablets (0.5 mg total) by mouth in the morning, at noon, and at bedtime. 30 tablet 0  ? DULoxetine (CYMBALTA) 30 MG capsule Take 3 capsules (90 mg total) by mouth daily. 270 capsule 0  ? enalapril (VASOTEC) 10 MG tablet Take 10 mg by mouth daily.    ? ketoconazole (NIZORAL) 2 % shampoo Apply 1 application topically every other day.    ? rosuvastatin (CRESTOR) 20 MG tablet Take 20 mg by mouth daily.    ? sildenafil (VIAGRA) 50 MG tablet Take 50 mg by mouth daily as needed for erectile dysfunction.    ? traZODone (DESYREL) 100 MG tablet Take 1 tablet (100 mg total) by mouth at bedtime. 30 tablet 1  ? VICTOZA 18 MG/3ML SOPN Inject 1.2 mg into the skin daily.    ? ?No current facility-administered medications for this visit.   ? ? ? ?Musculoskeletal: ?Strength & Muscle Tone: within normal limits ?Gait & Station: normal ?Patient leans: N/A ? ?Psychiatric Specialty Exam: ?Review of Systems  ?Psychiatric/Behavioral:  Positive for decreased concentration, dysphoric mood and sleep disturbance. Negative for agitation, confusion, hallucinations, self-injury and suicidal ideas. The patient is nervous/anxious. The patient is not hyperactive.   ?All other systems reviewed and are negative.  ?Blood pressure 99/67, pulse 69, temperature 97.6 ?F (36.4 ?C), temperature source Oral, weight  184 lb 12.8 oz (83.8 kg).Body mass index is 27.29 kg/m?.  ?General Appearance: Fairly Groomed  ?Eye Contact:  Good  ?Speech:  Clear and Coherent  ?Volume:  Normal  ?Mood:   better  ?Affect:  Appropriate, Congruent, and Restricted  ?Thought Process:  Coherent  ?Orientation:  Full (Time, Place, and Person)  ?Thought Content: Logical   ?Suicidal Thoughts:  No  ?Homicidal Thoughts:  No  ?Memory:  Immediate;   Good  ?Judgement:  Good  ?Insight:  Present  ?Psychomotor Activity:  Normal, occasional resting tremors in right hand  ?Concentration:  Concentration: Good and Attention Span: Good  ?Recall:  Good  ?Fund of Knowledge: Good  ?Language: Good  ?Akathisia:  No  ?Handed:  Right  ?AIMS (if indicated): not done  ?Assets:  Communication Skills ?Desire for Improvement  ?ADL's:  Intact  ?Cognition: WNL  ?Sleep:  Poor  ? ?Screenings: ?ECT-MADRS   ? ?Flowsheet Row ECT Treatment from 07/28/2021 in [facility]  ?MADRS Total Score 41  ? ?  ? ?Mini-Mental   ? ?Flowsheet Row ECT Treatment from 07/28/2021 in [facility]  ?Total Score (max 30 points ) 30  ? ?  ? ?PHQ2-9   ? ?Flowsheet Row Office Visit from 09/18/2021 in [facility] Office Visit from 04/07/2021 in [facility] Office Visit from 10/15/2020 in [facility]  Visit from 09/24/2020 in [facility]  ?PHQ-2 Total Score 6 6 0 6  ?PHQ-9 Total Score 19 22 -- 19  ? ?  ? ?Flowsheet Row Office Visit from 09/18/2021 in [facility]

## 2021-09-16 NOTE — Progress Notes (Deleted)
BH MD/PA/NP OP Progress Note ? ?09/16/2021 4:13 PM ?Ricky [name]  ?MRN:  [id number] ? ?Chief Complaint: No chief complaint on file. ? ?HPI: *** ?Visit Diagnosis: No diagnosis found. ? ?Past Psychiatric History: Please see initial evaluation for full details. I have reviewed the history. No updates at this time.  ?  ? ?Past Medical History:  ?Past Medical History:  ?Diagnosis Date  ? Anxiety   ? Arthritis   ? Carpal tunnel syndrome   ? Depression   ? Diabetes ([place])   ? Hyperlipidemia   ? Hypertension   ? Loss of teeth due to extraction   ? Parkinson's disease ([place])   ?  ?Past Surgical History:  ?Procedure Laterality Date  ? CARPAL TUNNEL RELEASE Right 11/28/2020  ? Procedure: CARPAL TUNNEL RELEASE ENDOSCOPIC;  Surgeon: Corky Mull, MD;  Location: [facility] ORS;  Service: Orthopedics;  Laterality: Right;  ? CARPAL TUNNEL RELEASE Left 01/22/2021  ? Procedure: CARPAL TUNNEL RELEASE ENDOSCOPIC;  Surgeon: Corky Mull, MD;  Location: [facility] ORS;  Service: Orthopedics;  Laterality: Left;  ? KNEE ARTHROSCOPY Right   ? ? ?Family Psychiatric History: Please see initial evaluation for full details. I have reviewed the history. No updates at this time.  ?  ? ?Family History:  ?Family History  ?Problem Relation Age of Onset  ? Diabetes Mother   ? Hypertension Mother   ? CVA Father   ? Heart attack Father   ? Depression Sister   ? Depression Brother   ? ? ?Social History:  ?Social History  ? ?Socioeconomic History  ? Marital status: Single  ?  Spouse name: Butch Penny  ? Number of children: Not on file  ? Years of education: Not on file  ? Highest education level: Not on file  ?Occupational History  ? Not on file  ?Tobacco Use  ? Smoking status: Never  ? Smokeless tobacco: Never  ?Vaping Use  ? Vaping Use: Never used  ?Substance and Sexual Activity  ? Alcohol use: Never  ? Drug use: Never  ? Sexual activity: Not on file  ?Other Topics Concern  ? Not on file  ?Social History Narrative  ? Lives with friend  ? ?Social Determinants of Health   ? ?Financial Resource Strain: Not on file  ?Food Insecurity: Not on file  ?Transportation Needs: Not on file  ?Physical Activity: Not on file  ?Stress: Not on file  ?Social Connections: Not on file  ? ? ?Allergies:  ?Allergies  ?Allergen Reactions  ? Mirtazapine Rash  ? ? ?Metabolic Disorder Labs: ?Lab Results  ?Component Value Date  ? HGBA1C 5.2 08/25/2021  ? MPG 102.54 08/25/2021  ? ?No results found for: PROLACTIN ?No results found for: CHOL, TRIG, HDL, CHOLHDL, VLDL, LDLCALC ?Lab Results  ?Component Value Date  ? TSH 3.886 08/25/2021  ? ? ?Therapeutic Level Labs: ?No results found for: LITHIUM ?No results found for: VALPROATE ?No components found for:  CBMZ ? ?Current Medications: ?Current Outpatient Medications  ?Medication Sig Dispense Refill  ? ARIPiprazole (ABILIFY) 10 MG tablet Take 10 mg by mouth daily.    ? atenolol (TENORMIN) 50 MG tablet Take 50 mg by mouth daily.    ? buPROPion (WELLBUTRIN XL) 150 MG 24 hr tablet Take 1 tablet (150 mg total) by mouth daily. Total of 450 mg daily. Take along with 300 mg tab 90 tablet 0  ? buPROPion (WELLBUTRIN XL) 300 MG 24 hr tablet Take 1 tablet (300 mg total) by mouth daily. Total of 450 mg  daily. Take along with 150 mg tab 90 tablet 0  ? carbidopa-levodopa (SINEMET IR) 25-100 MG tablet Take 1 tablet by mouth 5 (five) times daily.    ? clonazePAM (KLONOPIN) 1 MG tablet Take 0.5 tablets (0.5 mg total) by mouth in the morning, at noon, and at bedtime. 30 tablet 0  ? DULoxetine (CYMBALTA) 30 MG capsule Take 3 capsules (90 mg total) by mouth daily. 270 capsule 0  ? enalapril (VASOTEC) 10 MG tablet Take 10 mg by mouth daily.    ? ketoconazole (NIZORAL) 2 % shampoo Apply 1 application topically every other day.    ? rosuvastatin (CRESTOR) 20 MG tablet Take 20 mg by mouth daily.    ? sildenafil (VIAGRA) 50 MG tablet Take 50 mg by mouth daily as needed for erectile dysfunction.    ? traZODone (DESYREL) 50 MG tablet Take 50 mg by mouth at bedtime.    ? VICTOZA 18 MG/3ML SOPN  Inject 1.2 mg into the skin daily.    ? ?No current facility-administered medications for this visit.  ? ? ? ?Musculoskeletal: ?Strength & Muscle Tone: within normal limits ?Gait & Station: normal ?Patient leans: N/A ? ?Psychiatric Specialty Exam: ?Review of Systems  ?There were no vitals taken for this visit.There is no height or weight on file to calculate BMI.  ?General Appearance: {Appearance:22683}  ?Eye Contact:  {BHH EYE CONTACT:22684}  ?Speech:  {Speech:22685}  ?Volume:  {Volume (PAA):22686}  ?Mood:  {BHH MOOD:22306}  ?Affect:  {Affect (PAA):22687}  ?Thought Process:  {Thought Process (PAA):22688}  ?Orientation:  {BHH ORIENTATION (PAA):22689}  ?Thought Content: {Thought Content:22690}   ?Suicidal Thoughts:  {ST/HT (PAA):22692}  ?Homicidal Thoughts:  {ST/HT (PAA):22692}  ?Memory:  {BHH AG:6666793  ?Judgement:  {Judgement (PAA):22694}  ?Insight:  {Insight (PAA):22695}  ?Psychomotor Activity:  {Psychomotor (PAA):22696}  ?Concentration:  {Concentration:21399}  ?Recall:  {BHH GOOD/FAIR/POOR:22877}  ?Fund of Knowledge: {BHH GOOD/FAIR/POOR:22877}  ?Language: {BHH GOOD/FAIR/POOR:22877}  ?Akathisia:  {BHH YES OR NO:22294}  ?Handed:  {Handed:22697}  ?AIMS (if indicated): {Desc; done/not:10129}  ?Assets:  {Assets (PAA):22698}  ?ADL's:  {BHH TW:9249394  ?Cognition: {chl bhh cognition:304700322}  ?Sleep:  {BHH GOOD/FAIR/POOR:22877}  ? ?Screenings: ?ECT-MADRS   ? ?Flowsheet Row ECT Treatment from 07/28/2021 in [place]  ?MADRS Total Score 41  ? ?  ? ?Mini-Mental   ? ?Flowsheet Row ECT Treatment from 07/28/2021 in [place]  ?Total Score (max 30 points ) 30  ? ?  ? ?PHQ2-9   ? ?[place] Office Visit from 04/07/2021 in [place] Office Visit from 10/15/2020 in [place] Office Visit from 09/24/2020 in [place]  ?PHQ-2 Total Score 6 0 6   ?PHQ-9 Total Score 22 -- 19  ? ?  ? ?Flowsheet Row ED to Hosp-Admission (Discharged) from 08/25/2021 in [place] (1A) ECT Treatment from 08/11/2021 in [place] ECT Treatment from 08/08/2021 in [place]  ?C-SSRS RISK CATEGORY No Risk No Risk No Risk  ? ?  ? ? ? ?Assessment and Plan:  ? ?Ricky [name] is a 66 y.o. year old male with a history of depression, anxiety, parkinsonism on levodopa, diabetes, hypertension, hyperlipidemia, carpal tunnel syndrome, s/p knee arthroscopy , who presents for follow up appointment for below.  ?  ?1. MDD (major depressive disorder), recurrent episode, moderate ([place]) ?There has been slight improvement in depressed mood and anxiety, which coincided with  starting quetiapine/ECT. Psychosocial stressors includes demoralization secondary to pain and unemployment.  He could not continue with ECT as he could not get enough seizure activity according to the provider.  Will uptitrate quetiapine to optimize treatment for depression and anxiety.  Discussed potential metabolic side effect and EPS. Noted that although his depressive symptoms are more prominent with apathy, both the patient and his wife denies any apparent cognitive issues to be concerned of neuropsychiatric symptoms related to cognitive impairment. Will continue to monitor.  ?  ?2. Insomnia, unspecified type ?He has occasional insomnia and hypersomnia with profound fatigue. Although he was reportedly having snoring when he sleeps deeply, his wife is not interested in having sleep evaluation with the thought that he does not have this condition.  They are informed to notify this clinician if interested in making a referral.  ?  ?# AH/VH ?He reports hallucinations for the past few days with occasional disorientation.  He denies any other psychotic symptoms. No change in his medication. Etiology is most likely secondary to s/p ECT  treatment.  Will continue to monitor this.  ?  ?  ?# Alcohol use disorder in sustained remission ?Unchanged. He denies any alcohol use/craving for alcohol.  Will continue motivational interview. ?  ?Plan (the ph

## 2021-09-16 NOTE — Telephone Encounter (Signed)
received faxes requesting a refill on the wellbutrin 150 and the 300mg   ?

## 2021-09-16 NOTE — Telephone Encounter (Signed)
Ordered

## 2021-09-18 ENCOUNTER — Ambulatory Visit (INDEPENDENT_AMBULATORY_CARE_PROVIDER_SITE_OTHER): Payer: 59 | Admitting: Psychiatry

## 2021-09-18 ENCOUNTER — Encounter: Payer: Self-pay | Admitting: Psychiatry

## 2021-09-18 VITALS — BP 99/67 | HR 69 | Temp 97.6°F | Wt 184.8 lb

## 2021-09-18 DIAGNOSIS — E871 Hypo-osmolality and hyponatremia: Secondary | ICD-10-CM | POA: Diagnosis not present

## 2021-09-18 DIAGNOSIS — G4700 Insomnia, unspecified: Secondary | ICD-10-CM

## 2021-09-18 DIAGNOSIS — F411 Generalized anxiety disorder: Secondary | ICD-10-CM | POA: Diagnosis not present

## 2021-09-18 DIAGNOSIS — F331 Major depressive disorder, recurrent, moderate: Secondary | ICD-10-CM | POA: Diagnosis not present

## 2021-09-18 MED ORDER — TRAZODONE HCL 100 MG PO TABS: 100.0000 mg | ORAL_TABLET | Freq: Every day | ORAL | 1 refills | Status: DC

## 2021-09-18 MED ORDER — CLONAZEPAM 1 MG PO TABS: 1.0000 mg | ORAL_TABLET | Freq: Two times a day (BID) | ORAL | 1 refills | Status: DC

## 2021-10-20 NOTE — Progress Notes (Signed)
BH MD/PA/NP OP Progress Note  10/23/2021 3:09 PM Ricky [name]  MRN:  [id number]  Chief Complaint:  Chief Complaint  Patient presents with   Depression   Follow-up   HPI:  This is a follow-up appointment for depression and anxiety.  He states that he has been doing the same.  However, he has started to do yard work.  He intensive feels frustrated when something happens.  He talks about an example of him having some gas leak due to equipment while he was doing yard work.  He feels frustrated that he cannot make it work.  He tends to be very cursing, though he denies any physical aggression.  He feels more anxious when he is around with people.  He has hand tremors at times.  He denies any falls.  He sleeps fair on the current dose of trazodone.  He denies SI.  He denies hallucinations or paranoia.   Ricky [name], his wife presents to the interview.  She states that he has been able to go out to eat, which she has not been able to do for a while.  He appears to be anxious, overwhelmed and physically shaking.  She wonders if she can take additional dose of clonazepam.  He has taken medication regularly as prescribed.  They both agreed with the following medication changes.    Wt Readings from Last 3 Encounters:  10/23/21 190 lb (86.2 kg)  09/18/21 184 lb 12.8 oz (83.8 kg)  08/25/21 185 lb (83.9 kg)     Visit Diagnosis:    ICD-10-CM   1. MDD (major depressive disorder), recurrent episode, moderate (HCC)  F33.1     2. Anxiety state  F41.1     3. Insomnia, unspecified type  G47.00     4. Hyponatremia  E87.1       Past Psychiatric History: Please see initial evaluation for full details. I have reviewed the history. No updates at this time.     Past Medical History:  Past Medical History:  Diagnosis Date   Anxiety    Arthritis    Carpal tunnel syndrome    Depression    Diabetes (HCC)    Hyperlipidemia    Hypertension    Loss of teeth due to extraction    Parkinson's disease [facility])      Past Surgical History:  Procedure Laterality Date   CARPAL TUNNEL RELEASE Right 11/28/2020   Procedure: CARPAL TUNNEL RELEASE ENDOSCOPIC;  Surgeon: Christena Flake, MD;  Location: [facility] ORS;  Service: Orthopedics;  Laterality: Right;   CARPAL TUNNEL RELEASE Left 01/22/2021   Procedure: CARPAL TUNNEL RELEASE ENDOSCOPIC;  Surgeon: Christena Flake, MD;  Location: [facility] ORS;  Service: Orthopedics;  Laterality: Left;   KNEE ARTHROSCOPY Right     Family Psychiatric History: Please see initial evaluation for full details. I have reviewed the history. No updates at this time.     Family History:  Family History  Problem Relation Age of Onset   Diabetes Mother    Hypertension Mother    CVA Father    Heart attack Father    Depression Sister    Depression Brother     Social History:  Social History   Socioeconomic History   Marital status: Single    Spouse name: Ricky [name]   Number of children: Not on file   Years of education: Not on file   Highest education level: Not on file  Occupational History   Not on file  Tobacco Use  Smoking status: Never   Smokeless tobacco: Never  Vaping Use   Vaping Use: Never used  Substance and Sexual Activity   Alcohol use: Never   Drug use: Never   Sexual activity: Not on file  Other Topics Concern   Not on file  Social History Narrative   Lives with friend   Social Determinants of Health   Financial Resource Strain: Not on file  Food Insecurity: Not on file  Transportation Needs: Not on file  Physical Activity: Not on file  Stress: Not on file  Social Connections: Not on file    Allergies:  Allergies  Allergen Reactions   Mirtazapine Rash    Metabolic Disorder Labs: Lab Results  Component Value Date   HGBA1C 5.2 08/25/2021   MPG 102.54 08/25/2021   No results found for: "PROLACTIN" No results found for: "CHOL", "TRIG", "HDL", "CHOLHDL", "VLDL", "LDLCALC" Lab Results  Component Value Date   TSH 3.886 08/25/2021     Therapeutic Level Labs: No results found for: "LITHIUM" No results found for: "VALPROATE" No results found for: "CBMZ"  Current Medications: Current Outpatient Medications  Medication Sig Dispense Refill   atenolol (TENORMIN) 50 MG tablet Take 50 mg by mouth daily.     buPROPion (WELLBUTRIN XL) 150 MG 24 hr tablet Take 1 tablet (150 mg total) by mouth daily. Total of 450 mg daily. Take along with 300 mg tab 90 tablet 0   buPROPion (WELLBUTRIN XL) 300 MG 24 hr tablet Take 1 tablet (300 mg total) by mouth daily. Total of 450 mg daily. Take along with 150 mg tab 90 tablet 0   busPIRone (BUSPAR) 5 MG tablet Take 1 tablet (5 mg total) by mouth 2 (two) times daily. 60 tablet 1   carbidopa-levodopa (SINEMET IR) 25-100 MG tablet Take 1 tablet by mouth 5 (five) times daily.     clonazePAM (KLONOPIN) 1 MG tablet Take 0.5 tablets (0.5 mg total) by mouth in the morning, at noon, and at bedtime. 30 tablet 0   enalapril (VASOTEC) 10 MG tablet Take 10 mg by mouth daily.     ketoconazole (NIZORAL) 2 % shampoo Apply 1 application topically every other day.     rosuvastatin (CRESTOR) 20 MG tablet Take 20 mg by mouth daily.     sildenafil (VIAGRA) 50 MG tablet Take 50 mg by mouth daily as needed for erectile dysfunction.     VICTOZA 18 MG/3ML SOPN Inject 1.2 mg into the skin daily.     [START ON 11/18/2021] clonazePAM (KLONOPIN) 1 MG tablet Take 1 tablet (1 mg total) by mouth 2 (two) times daily. 60 tablet 0   DULoxetine (CYMBALTA) 30 MG capsule Take 3 capsules (90 mg total) by mouth daily. 90 capsule 2   [START ON 11/18/2021] traZODone (DESYREL) 100 MG tablet Take 1 tablet (100 mg total) by mouth at bedtime. 30 tablet 2   No current facility-administered medications for this visit.     Musculoskeletal: Strength & Muscle Tone: within normal limits Gait & Station: normal Patient leans: N/A  Psychiatric Specialty Exam: Review of Systems  Psychiatric/Behavioral:  Positive for dysphoric mood and sleep  disturbance. Negative for agitation, behavioral problems, confusion, decreased concentration, hallucinations, self-injury and suicidal ideas. The patient is nervous/anxious. The patient is not hyperactive.   All other systems reviewed and are negative.   Blood pressure 113/73, pulse 69, temperature 97.9 F (36.6 C), height 5' 8.5" (1.74 m), weight 190 lb (86.2 kg), SpO2 97 %.Body mass index is 28.47 kg/m.  General Appearance: Fairly Groomed  Eye Contact:  Good  Speech:  Clear and Coherent  Volume:  Normal  Mood:  Anxious  Affect:  Appropriate, Congruent, and Restricted  Thought Process:  Coherent  Orientation:  Full (Time, Place, and Person)  Thought Content: Logical   Suicidal Thoughts:  No  Homicidal Thoughts:  No  Memory:  Immediate;   Good  Judgement:  Good  Insight:  Good  Psychomotor Activity:  Normal, occasional dyskinesia in his right arm  Concentration:  Concentration: Good and Attention Span: Good  Recall:  Good  Fund of Knowledge: Good  Language: Good  Akathisia:  No  Handed:  Right  AIMS (if indicated): not done  Assets:  Communication Skills Desire for Improvement  ADL's:  Intact  Cognition: WNL  Sleep:  Fair   Screenings: ECT-MADRS    Flowsheet Row [facility]  MADRS Total Score 41      Mini-Mental    Flowsheet Row [facility]  Total Score (max 30 points ) 30      PHQ2-9    Flowsheet Row [facility] from [facility]2021 [facility]  PHQ-2 Total Score 6 6 6  0 6  PHQ-9 Total Score 14 19 22  -- 19       Flowsheet Row [facility]Ad[facility] (Discharged[facility] (1A)  C-SSRS RISK CATEGORY No Risk No Risk No Risk        Assessment and Plan:  Ricky LoopJames [name] is a 66 y.o. year old male with a history of depression, anxiety, parkinsonism on levodopa, diabetes, hypertension, hyperlipidemia, carpal tunnel syndrome, s/p knee arthroscopy, who presents for follow up appointment for below.   1. MDD (major depressive disorder), recurrent episode, moderate (HCC) 2. Anxiety state Exam is notable for more engagement in the interview, and there has been overall improvement in depressive symptoms since the last visit. Psychosocial stressors includes demoralization secondary to pain and unemployment.  Will continue duloxetine and bupropion to target depression.  Will add BuSpar to target anxiety.  Will continue clonazepam as needed for anxiety.   3. Insomnia, unspecified type Improving.  Will continue current dose of trazodone to target insomnia. Although he was reportedly having snoring when he sleeps deeply, his wife is not interested in having sleep evaluation with the thought that he does not have this condition.  They are informed to notify this clinician if interested in making a referral.   # Hyponatremia Resolving.  He has a history of hyponatremia.  Will continue to monitor this.    # Alcohol use disorder in sustained remission Unchanged. He denies any alcohol use/craving for alcohol.  Will continue motivational interview.   Plan  Continue duloxetine 90 mg daily  Continue bupropion 450 mg daily Start Buspar 5 mg twice a day  Continue Trazodone 100 mg at night Continue clonazepam 1 mg twice a day  Next appointment- 7/17 at 9:30 for 30 mins, in person - he sees a therapist   Addendum: contacted the  pharmacy. Bupropion 150/300 mg was filled on  10/18 for 30 days With the information above, this clinician contacted the patient again.  They stated that they have been taking the bupropion regularly.  There is a bottle of medication, which was prescribed in September for a month.  Both agreed to continue taking the current dose of bupropion regularly.    Past trials of medication: lexapro, duloxetine, venlafaxine, mirtazapine (rash), Abilify, clonazepam,    The patient demonstrates the following risk factors for suicide: Chronic risk factors for suicide include: psychiatric disorder of depression and chronic pain. Acute risk factors for suicide include: unemployment. Protective factors for this patient include: positive social support, coping skills and hope for the future. Considering these factors, the overall suicide risk at this point appears to be low. Patient is appropriate for outpatient follow up.     Collaboration of Care: Collaboration of Care: Other N/A  Patient/Guardian was advised Release of Information must be obtained prior to any record release in order to collaborate their care with an outside provider. Patient/Guardian was advised if they have not already done so to contact the registration department to sign all necessary forms in order for [place] to release information regarding their care.   Consent: Patient/Guardian gives verbal consent for treatment and assignment of benefits for services provided during this visit. Patient/Guardian expressed understanding and agreed to proceed.    Neysa Hotter, MD 10/23/2021, 3:09 PM

## 2021-10-23 ENCOUNTER — Encounter: Payer: Self-pay | Admitting: Psychiatry

## 2021-10-23 ENCOUNTER — Ambulatory Visit: Payer: 59 | Admitting: Psychiatry

## 2021-10-23 VITALS — BP 113/73 | HR 69 | Temp 97.9°F | Ht 68.5 in | Wt 190.0 lb

## 2021-10-23 DIAGNOSIS — G4700 Insomnia, unspecified: Secondary | ICD-10-CM

## 2021-10-23 DIAGNOSIS — E871 Hypo-osmolality and hyponatremia: Secondary | ICD-10-CM | POA: Diagnosis not present

## 2021-10-23 DIAGNOSIS — F411 Generalized anxiety disorder: Secondary | ICD-10-CM | POA: Diagnosis not present

## 2021-10-23 DIAGNOSIS — F331 Major depressive disorder, recurrent, moderate: Secondary | ICD-10-CM | POA: Diagnosis not present

## 2021-10-23 MED ORDER — BUSPIRONE HCL 5 MG PO TABS: 5.0000 mg | ORAL_TABLET | Freq: Two times a day (BID) | ORAL | 1 refills | Status: DC

## 2021-10-23 MED ORDER — CLONAZEPAM 1 MG PO TABS: 1.0000 mg | ORAL_TABLET | Freq: Two times a day (BID) | ORAL | 0 refills | Status: DC

## 2021-10-23 MED ORDER — TRAZODONE HCL 100 MG PO TABS: 100.0000 mg | ORAL_TABLET | Freq: Every day | ORAL | 2 refills | Status: DC

## 2021-10-23 MED ORDER — DULOXETINE HCL 30 MG PO CPEP: 90.0000 mg | ORAL_CAPSULE | Freq: Every day | ORAL | 2 refills | Status: DC

## 2021-11-14 DIAGNOSIS — E538 Deficiency of other specified B group vitamins: Secondary | ICD-10-CM | POA: Insufficient documentation

## 2021-11-18 NOTE — Progress Notes (Signed)
BH MD/PA/NP OP Progress Note  11/24/2021 10:00 AM Ricky [name]  MRN:  [id number]  Chief Complaint:  Chief Complaint  Patient presents with   Follow-up   HPI:  This is a follow-up appointment for depression and anxiety.  He states that he thinks BuSpar is working.  It has helped a lot for anxiety, although he wishes to feel better.  He has been able to go out and eat out.  He has started to work on the year at work, although he wishes to do more.  Although he used to have panic attacks if he were not to have clonazepam when he goes out, it is not bothering for him as much anymore.  He reports good relationship with [name]. He takes 0.5 mg clonazepam at times when he goes out.  He has hypersomnia.  He denies any dizziness or fall.  He has good appetite.  He denies SI.  He denies any panic attacks since the last visit.  He thinks his parkinson is "great," and has noticed any symptoms from it.  He is willing to take this for more frequently.   Employment: unemployed since Dec 2021. Quit due to pain, used to work as a Lawyer since age 66 Support: [name]/fiance, older son, his parents/age 21s, who live next door Household: fiance Marital status: married (divorced twice) Number of children: 3 children, age 12-41 Education: 12 th grade  Wt Readings from Last 3 Encounters:  11/24/21 189 lb 9.6 oz (86 kg)  10/23/21 190 lb (86.2 kg)  09/18/21 184 lb 12.8 oz (83.8 kg)     Visit Diagnosis:    ICD-10-CM   1. MDD (major depressive disorder), recurrent episode, mild (HCC)  F33.0     2. Anxiety state  F41.1     3. Insomnia, unspecified type  G47.00     4. Hyponatremia  E87.1       Past Psychiatric History: Please see initial evaluation for full details. I have reviewed the history. No updates at this time.     Past Medical History:  Past Medical History:  Diagnosis Date   Anxiety    Arthritis    Carpal tunnel syndrome    Depression    Diabetes (HCC)    Hyperlipidemia     Hypertension    Loss of teeth due to extraction    Parkinson's disease [facility])     Past Surgical History:  Procedure Laterality Date   CARPAL TUNNEL RELEASE Right 11/28/2020   Procedure: CARPAL TUNNEL RELEASE ENDOSCOPIC;  Surgeon: Christena Flake, MD;  Location: [facility] ORS;  Service: Orthopedics;  Laterality: Right;   CARPAL TUNNEL RELEASE Left 01/22/2021   Procedure: CARPAL TUNNEL RELEASE ENDOSCOPIC;  Surgeon: Christena Flake, MD;  Location: [facility] ORS;  Service: Orthopedics;  Laterality: Left;   KNEE ARTHROSCOPY Right     Family Psychiatric History: Please see initial evaluation for full details. I have reviewed the history. No updates at this time.     Family History:  Family History  Problem Relation Age of Onset   Diabetes Mother    Hypertension Mother    CVA Father    Heart attack Father    Depression Sister    Depression Brother     Social History:  Social History   Socioeconomic History   Marital status: Single    Spouse name: [name]   Number of children: Not on file   Years of education: Not on file   Highest education level: Not on  file  Occupational History   Not on file  Tobacco Use   Smoking status: Never   Smokeless tobacco: Never  Vaping Use   Vaping Use: Never used  Substance and Sexual Activity   Alcohol use: Never   Drug use: Never   Sexual activity: Not on file  Other Topics Concern   Not on file  Social History Narrative   Lives with friend   Social Determinants of Health   Financial Resource Strain: Not on file  Food Insecurity: Not on file  Transportation Needs: Not on file  Physical Activity: Not on file  Stress: Not on file  Social Connections: Not on file    Allergies:  Allergies  Allergen Reactions   Mirtazapine Rash    Metabolic Disorder Labs: Lab Results  Component Value Date   HGBA1C 5.2 08/25/2021   MPG 102.54 08/25/2021   No results found for: "PROLACTIN" No results found for: "CHOL", "TRIG", "HDL", "CHOLHDL", "VLDL",  "LDLCALC" Lab Results  Component Value Date   TSH 3.886 08/25/2021    Therapeutic Level Labs: No results found for: "LITHIUM" No results found for: "VALPROATE" No results found for: "CBMZ"  Current Medications: Current Outpatient Medications  Medication Sig Dispense Refill   ARIPiprazole (ABILIFY) 30 MG tablet Take 30 mg by mouth daily.     atenolol (TENORMIN) 50 MG tablet Take 50 mg by mouth daily.     buPROPion (WELLBUTRIN XL) 150 MG 24 hr tablet Take 1 tablet (150 mg total) by mouth daily. Total of 450 mg daily. Take along with 300 mg tab 90 tablet 0   buPROPion (WELLBUTRIN XL) 300 MG 24 hr tablet Take 1 tablet (300 mg total) by mouth daily. Total of 450 mg daily. Take along with 150 mg tab 90 tablet 0   carbidopa-levodopa (SINEMET IR) 25-100 MG tablet Take 1 tablet by mouth 5 (five) times daily.     DULoxetine (CYMBALTA) 30 MG capsule Take 3 capsules (90 mg total) by mouth daily. 90 capsule 2   enalapril (VASOTEC) 10 MG tablet Take 10 mg by mouth daily.     ketoconazole (NIZORAL) 2 % shampoo Apply 1 application topically every other day.     rosuvastatin (CRESTOR) 20 MG tablet Take 20 mg by mouth daily.     sildenafil (VIAGRA) 50 MG tablet Take 50 mg by mouth daily as needed for erectile dysfunction.     traZODone (DESYREL) 100 MG tablet Take 1 tablet (100 mg total) by mouth at bedtime. 30 tablet 2   VICTOZA 18 MG/3ML SOPN Inject 1.2 mg into the skin daily.     [START ON 11/28/2021] busPIRone (BUSPAR) 5 MG tablet Take 1 tablet (5 mg total) by mouth in the morning, at noon, and at bedtime. 90 tablet 0   [START ON 12/19/2021] clonazePAM (KLONOPIN) 1 MG tablet Take 0.5-1 tablets (0.5-1 mg total) by mouth 2 (two) times daily. 60 tablet 0   No current facility-administered medications for this visit.     Musculoskeletal: Strength & Muscle Tone: within normal limits Gait & Station: normal Patient leans: N/A  Psychiatric Specialty Exam: Review of Systems  Psychiatric/Behavioral:   Positive for sleep disturbance. Negative for agitation, behavioral problems, confusion, decreased concentration, dysphoric mood, hallucinations, self-injury and suicidal ideas. The patient is nervous/anxious. The patient is not hyperactive.   All other systems reviewed and are negative.   Blood pressure 121/79, pulse 71, temperature 98.3 F (36.8 C), temperature source Temporal, weight 189 lb 9.6 oz (86 kg).Body mass index is  28.41 kg/m.  General Appearance: Fairly Groomed  Eye Contact:  Good  Speech:  Clear and Coherent  Volume:  Normal  Mood:   better  Affect:  Appropriate, Congruent, and brighter  Thought Process:  Coherent  Orientation:  Full (Time, Place, and Person)  Thought Content: Logical   Suicidal Thoughts:  No  Homicidal Thoughts:  No  Memory:  Immediate;   Good  Judgement:  Good  Insight:  Good  Psychomotor Activity:  Normal  Concentration:  Concentration: Good and Attention Span: Good  Recall:  Good  Fund of Knowledge: Good  Language: Good  Akathisia:  No  Handed:  Right  AIMS (if indicated): not done  Assets:  Communication Skills Desire for Improvement  ADL's:  Intact  Cognition: WNL  Sleep:   hypersomnia   Screenings: ECT-MADRS    Flowsheet Row ECT Treatment from 07[facility]28/2021 in [facility]  MADRS Total Score 41      Mini-Mental    Flowsheet Row ECT Treatment from 07/28/2021 in [facility]  Total Score (max 30 points ) 30      PHQ2-9    Flowsheet Row Office Visit from 11/24/2021 in [facility] Office Visit from 10/23/2021 in [facility] Office Visit from 09/18/2021 in [facility] Office Visit from 04/07/2021 in [facility] Office Visit from 10/15/2020 in [facility]  PHQ-2 Total Score 2 6 6 6  0  PHQ-9 Total Score 6 14 19 22  --       Flowsheet Row Office Visit from 11/24/2021 in [facility] Office Visit from 10/23/2021 in [facility] Office Visit from 09/18/2021 in [facility]  C-SSRS RISK CATEGORY No Risk No Risk No Risk        Assessment and Plan:  Antavious Spanos is a 66 y.o. year old male with a history of depression, anxiety, parkinsonism on levodopa, diabetes, hypertension, hyperlipidemia, carpal tunnel syndrome, s/p knee arthroscopy, who presents for follow up appointment for below.    1. MDD (major depressive disorder), recurrent episode, mild (HCC) 2. Anxiety state Exam is notable for brighter affect , and there is significant improvement in anxiety and depressive symptoms since starting BuSpar. Psychosocial stressors includes demoralization secondary to pain and unemployment.  Will do further uptitration of BuSpar to target anxiety.  Will continue duloxetine and a bupropion to target depression.  Will continue clonazepam as needed for anxiety.  He is now willing to take it only when it is necessarily; is willing to taper down this medication.   3. Insomnia, unspecified type Unstable/hypersomnia. He is advised to try a lower dose of trazodone if he feels fatigued in the morning. Although he was reportedly having snoring when he sleeps deeply, his wife is not interested in having sleep evaluation with the thought that he does not have this condition.  They are informed to notify this clinician if interested in making a referral.   4. Hyponatremia Resolving.  He has a history of hyponatremia.  Will continue to monitor this.    # Alcohol use disorder in sustained remission Unchanged. He denies any alcohol use/craving for alcohol.  Will continue motivational interview.   Plan  Continue duloxetine 90 mg daily  Continue bupropion 450 mg daily Increase Buspar 5 mg three times a day Decrease Trazodone 50-100 mg at night as needed for  sleep Continue clonazepam 0.5-1 mg twice a  day as needed for anxiety Next appointment- 8/14 at 4 PM for 30 mins, in person - he sees a therapist   Addendum: contacted the pharmacy. Bupropion 150/300 mg was filled on 10/18 for 30 days With the information above, this clinician contacted the patient again.  They stated that they have been taking the bupropion regularly.  There is a bottle of medication, which was prescribed in September for a month.  Both agreed to continue taking the current dose of bupropion regularly.    Past trials of medication: lexapro, duloxetine, venlafaxine, mirtazapine (rash), Abilify, clonazepam,    The patient demonstrates the following risk factors for suicide: Chronic risk factors for suicide include: psychiatric disorder of depression and chronic pain. Acute risk factors for suicide include: unemployment. Protective factors for this patient include: positive social support, coping skills and hope for the future. Considering these factors, the overall suicide risk at this point appears to be low. Patient is appropriate for outpatient follow up.       Collaboration of Care: Collaboration of Care: Other N/A  Patient/Guardian was advised Release of Information must be obtained prior to any record release in order to collaborate their care with an outside provider. Patient/Guardian was advised if they have not already done so to contact the registration department to sign all necessary forms in order for [place] to release information regarding their care.   Consent: Patient/Guardian gives verbal consent for treatment and assignment of benefits for services provided during this visit. Patient/Guardian expressed understanding and agreed to proceed.    Neysa Hotter, MD 11/24/2021, 10:00 AM

## 2021-11-24 ENCOUNTER — Encounter: Payer: Self-pay | Admitting: Psychiatry

## 2021-11-24 ENCOUNTER — Ambulatory Visit: Payer: 59 | Admitting: Psychiatry

## 2021-11-24 VITALS — BP 121/79 | HR 71 | Temp 98.3°F | Wt 189.6 lb

## 2021-11-24 DIAGNOSIS — F330 Major depressive disorder, recurrent, mild: Secondary | ICD-10-CM

## 2021-11-24 DIAGNOSIS — E871 Hypo-osmolality and hyponatremia: Secondary | ICD-10-CM

## 2021-11-24 DIAGNOSIS — G4700 Insomnia, unspecified: Secondary | ICD-10-CM

## 2021-11-24 DIAGNOSIS — F411 Generalized anxiety disorder: Secondary | ICD-10-CM

## 2021-11-24 MED ORDER — BUSPIRONE HCL 5 MG PO TABS: 5.0000 mg | ORAL_TABLET | Freq: Three times a day (TID) | ORAL | 0 refills | Status: DC

## 2021-11-24 MED ORDER — CLONAZEPAM 1 MG PO TABS: 0.5000 mg | ORAL_TABLET | Freq: Two times a day (BID) | ORAL | 0 refills | Status: DC

## 2021-11-24 NOTE — Patient Instructions (Signed)
Continue duloxetine 90 mg daily  Continue bupropion 450 mg daily Increase Buspar 5 mg three times a day Decrease Trazodone 50-100 mg at night as needed for sleep Continue clonazepam 0.5-1 mg twice a day as needed for anxiety Next appointment- 8/14 at 4 PM

## 2021-12-01 ENCOUNTER — Other Ambulatory Visit: Payer: Self-pay | Admitting: Psychiatry

## 2021-12-20 NOTE — Progress Notes (Unsigned)
BH MD/PA/NP OP Progress Note  12/22/2021 4:36 PM Ricky [name]  MRN:  [id number]  Chief Complaint:  Chief Complaint  Patient presents with   Follow-up   HPI:  This is a follow-up appointment for depression and then anxiety.  He states that he has been doing better.  He thinks his anxiety has been improving with BuSpar.  His significant other, Ricky [name], also states that he has been doing good.  He helped her after he had knee surgery in July.  He was able to go to the store by himself.  Although he is not "there yet" to enjoy things, he thinks things are going a good direction. The patient has mood symptoms as in PHQ-9/GAD-7.  He has good appetite.  He denies SI.  He denies panic attacks.  He takes clonazepam half tablet to 1 tablet twice a day.  He feels dizzy at times when he bends over; he agrees to contact his PCP about this along with lower blood pressure.  He is willing to try higher dose of BuSpar at this time.    Ref Range & Units 2 mo ago  Glucose 70 - 110 mg/dL 229 High    Sodium 798 - 145 mmol/L 140   Potassium 3.6 - 5.1 mmol/L 3.7   Chloride 97 - 109 mmol/L 105   Carbon Dioxide (CO2) 22.0 - 32.0 mmol/L 27.0   Calcium 8.7 - 10.3 mg/dL 9.0   Urea Nitrogen (BUN) 7 - 25 mg/dL 11   Creatinine 0.7 - 1.3 mg/dL 0.9     Employment: unemployed since Dec 2021. Quit due to pain, used to work as a Lawyer since age 68 Support: [name]/fiance, older son, his parents/age 56s, who live next door Household: fiance Marital status: married (divorced twice) Number of children: 3 children, age 84-41 Education: 12 th grade  Wt Readings from Last 3 Encounters:  12/22/21 187 lb 3.2 oz (84.9 kg)  11/24/21 189 lb 9.6 oz (86 kg)  10/23/21 190 lb (86.2 kg)     Visit Diagnosis:    ICD-10-CM   1. MDD (major depressive disorder), recurrent episode, mild (HCC)  F33.0     2. Anxiety state  F41.1     3. Insomnia, unspecified type  G47.00     4. Hyponatremia  E87.1       Past  Psychiatric History: Please see initial evaluation for full details. I have reviewed the history. No updates at this time.     Past Medical History:  Past Medical History:  Diagnosis Date   Anxiety    Arthritis    Carpal tunnel syndrome    Depression    Diabetes (HCC)    Hyperlipidemia    Hypertension    Loss of teeth due to extraction    Parkinson's disease [facility])     Past Surgical History:  Procedure Laterality Date   CARPAL TUNNEL RELEASE Right 11/28/2020   Procedure: CARPAL TUNNEL RELEASE ENDOSCOPIC;  Surgeon: Christena Flake, MD;  Location: [facility] ORS;  Service: Orthopedics;  Laterality: Right;   CARPAL TUNNEL RELEASE Left 01/22/2021   Procedure: CARPAL TUNNEL RELEASE ENDOSCOPIC;  Surgeon: Christena Flake, MD;  Location: [facility] ORS;  Service: Orthopedics;  Laterality: Left;   KNEE ARTHROSCOPY Right     Family Psychiatric History: Please see initial evaluation for full details. I have reviewed the history. No updates at this time.     Family History:  Family History  Problem Relation Age of Onset   Diabetes Mother  Hypertension Mother    CVA Father    Heart attack Father    Depression Sister    Depression Brother     Social History:  Social History   Socioeconomic History   Marital status: Single    Spouse name: Ricky [name]   Number of children: Not on file   Years of education: Not on file   Highest education level: Not on file  Occupational History   Not on file  Tobacco Use   Smoking status: Never   Smokeless tobacco: Never  Vaping Use   Vaping Use: Never used  Substance and Sexual Activity   Alcohol use: Never   Drug use: Never   Sexual activity: Not on file  Other Topics Concern   Not on file  Social History Narrative   Lives with friend   Social Determinants of Health   Financial Resource Strain: Not on file  Food Insecurity: Not on file  Transportation Needs: Not on file  Physical Activity: Not on file  Stress: Not on file  Social Connections: Not  on file    Allergies:  Allergies  Allergen Reactions   Mirtazapine Rash    Metabolic Disorder Labs: Lab Results  Component Value Date   HGBA1C 5.2 08/25/2021   MPG 102.54 08/25/2021   No results found for: "PROLACTIN" No results found for: "CHOL", "TRIG", "HDL", "CHOLHDL", "VLDL", "LDLCALC" Lab Results  Component Value Date   TSH 3.886 08/25/2021    Therapeutic Level Labs: No results found for: "LITHIUM" No results found for: "VALPROATE" No results found for: "CBMZ"  Current Medications: Current Outpatient Medications  Medication Sig Dispense Refill   ARIPiprazole (ABILIFY) 30 MG tablet Take 30 mg by mouth daily.     atenolol (TENORMIN) 50 MG tablet Take 50 mg by mouth daily.     carbidopa-levodopa (SINEMET IR) 25-100 MG tablet Take 1 tablet by mouth 5 (five) times daily.     enalapril (VASOTEC) 10 MG tablet Take 10 mg by mouth daily.     ketoconazole (NIZORAL) 2 % shampoo Apply 1 application topically every other day.     rosuvastatin (CRESTOR) 20 MG tablet Take 20 mg by mouth daily.     sildenafil (VIAGRA) 50 MG tablet Take 50 mg by mouth daily as needed for erectile dysfunction.     traZODone (DESYREL) 100 MG tablet Take 1 tablet (100 mg total) by mouth at bedtime. 30 tablet 2   VICTOZA 18 MG/3ML SOPN Inject 1.2 mg into the skin daily.     buPROPion (WELLBUTRIN XL) 150 MG 24 hr tablet Take 1 tablet (150 mg total) by mouth daily. Total of 450 mg daily. Take along with 300 mg tab 30 tablet 2   buPROPion (WELLBUTRIN XL) 300 MG 24 hr tablet Take 1 tablet (300 mg total) by mouth daily. Total of 450 mg daily. Take along with 150 mg tab 30 tablet 2   busPIRone (BUSPAR) 10 MG tablet Take 0.5 tablets (5 mg total) by mouth 2 (two) times daily. 30 tablet 1   clonazePAM (KLONOPIN) 1 MG tablet Take 0.5-1 tablets (0.5-1 mg total) by mouth 2 (two) times daily. 60 tablet 1   [START ON 01/22/2022] DULoxetine (CYMBALTA) 30 MG capsule Take 3 capsules (90 mg total) by mouth daily. 90 capsule  2   No current facility-administered medications for this visit.     Musculoskeletal: Strength & Muscle Tone: within normal limits Gait & Station: normal Patient leans: N/A  Psychiatric Specialty Exam: Review of Systems  Psychiatric/Behavioral:  Positive for decreased concentration, dysphoric mood and sleep disturbance. Negative for agitation, behavioral problems, confusion, hallucinations, self-injury and suicidal ideas. The patient is nervous/anxious. The patient is not hyperactive.   All other systems reviewed and are negative.   Blood pressure 97/67, pulse 79, temperature 98.3 F (36.8 C), temperature source Temporal, weight 187 lb 3.2 oz (84.9 kg).Body mass index is 28.05 kg/m.  General Appearance: Fairly Groomed  Eye Contact:  Good  Speech:  Clear and Coherent  Volume:  Normal  Mood:   better  Affect:  Appropriate, Congruent, and Restricted  Thought Process:  Coherent  Orientation:  Full (Time, Place, and Person)  Thought Content: Logical   Suicidal Thoughts:  No  Homicidal Thoughts:  No  Memory:  Immediate;   Good  Judgement:  Good  Insight:  Good  Psychomotor Activity:   occasional rest tremor in his right hand  Concentration:  Concentration: Good and Attention Span: Good  Recall:  Good  Fund of Knowledge: Good  Language: Good  Akathisia:  No  Handed:  Right  AIMS (if indicated): not done  Assets:  Communication Skills Desire for Improvement  ADL's:  Intact  Cognition: WNL  Sleep:  Fair   Screenings: ECT-MADRS    Flowsheet Row [facility]  MADRS Total Score 41      GAD-7    Flowsheet Row Office Visit from 12/22/2021 in [facility]  Total GAD-7 Score 8      Mini-Mental    Flowsheet Row ECT Treatment from 07/28/2021 in [facility]  Total Score (max 30 points ) 30      PHQ2-9    Flowsheet Row Office Visit from  12/22/2021 in [facility] Office Visit from 11/24/2021 in [facility] Office Visit from 10/23/2021 in [facility] Office Visit from 09/18/2021 in [facility] Office Visit from 04/07/2021 in [facility]  PHQ-2 Total Score 4 2 6 6 6   PHQ-9 Total Score 12 6 14 19 22       Flowsheet Row Office Visit from 12/22/2021 in [facility] Office Visit from 11/24/2021 in [facility] Office Visit from 10/23/2021 in [facility]  C-SSRS RISK CATEGORY No Risk No Risk No Risk        Assessment and Plan:  Linville Decarolis is a 66 y.o. year old male with a history of depression, anxiety, parkinsonism on levodopa, diabetes, hypertension, hyperlipidemia, carpal tunnel syndrome, s/p knee arthroscopy, who presents for follow up appointment for below.   1. MDD (major depressive disorder), recurrent episode, mild (HCC) 2. Anxiety state There has been overall improvement in anxiety and depressive symptoms since starting /uptitration of BuSpar. Psychosocial stressors includes demoralization secondary to pain and unemployment.  Will do further uptitration of BuSpar to optimize treatment for anxiety.  Will continue duloxetine and bupropion to target depression.  Will continue clonazepam as needed for anxiety.  Will consider tapering down off this medication in the future to avoid potential side effect if he is able to tolerate this.   3. Insomnia, unspecified type Improving.  Will continue current dose of trazodone as needed for insomnia. Although he was reportedly having snoring when he sleeps deeply, his wife is not interested in having sleep evaluation with the thought that he does not have this condition.  They are informed to notify this clinician if interested in making  a referral.   # low BP He has  low blood pressure on today's exam.  He has occasional dizziness.  He was advised to contact his PCP for father evaluation/treatment.   4. Hyponatremia He has a history of hyponatremia, which is being resolved.  He has an upcoming appointment with his PCP.    # Alcohol use disorder in sustained remission Unchanged. He denies any alcohol use/craving for alcohol.  Will continue motivational interview.   Plan  Continue duloxetine 90 mg daily  Continue bupropion 450 mg daily Increase Buspar 10 mg twice a day Continue Trazodone 50  mg at night as needed for sleep Continue clonazepam 0.5-1 mg twice a day as needed for anxiety Next appointment- 10/2 at 3 PM for 30 mins, in person - he sees a therapist - According to the chart review, he may still take Abilify 30 mg daily. Will check this at the follow up appointment.    Addendum: contacted the pharmacy. Bupropion 150/300 mg was filled on 10/18 for 30 days With the information above, this clinician contacted the patient again.  They stated that they have been taking the bupropion regularly.  There is a bottle of medication, which was prescribed in September for a month.  Both agreed to continue taking the current dose of bupropion regularly.    Past trials of medication: lexapro, duloxetine, venlafaxine, mirtazapine (rash), Abilify, clonazepam,    The patient demonstrates the following risk factors for suicide: Chronic risk factors for suicide include: psychiatric disorder of depression and chronic pain. Acute risk factors for suicide include: unemployment. Protective factors for this patient include: positive social support, coping skills and hope for the future. Considering these factors, the overall suicide risk at this point appears to be low. Patient is appropriate for outpatient follow up.          Collaboration of Care: Collaboration of Care: Other N/A  Patient/Guardian was advised Release of Information must be obtained prior to any  record release in order to collaborate their care with an outside provider. Patient/Guardian was advised if they have not already done so to contact the registration department to sign all necessary forms in order for [place] to release information regarding their care.   Consent: Patient/Guardian gives verbal consent for treatment and assignment of benefits for services provided during this visit. Patient/Guardian expressed understanding and agreed to proceed.    Neysa Hotter, MD 12/22/2021, 4:36 PM

## 2021-12-22 ENCOUNTER — Encounter: Payer: Self-pay | Admitting: Psychiatry

## 2021-12-22 ENCOUNTER — Ambulatory Visit (INDEPENDENT_AMBULATORY_CARE_PROVIDER_SITE_OTHER): Payer: 59 | Admitting: Psychiatry

## 2021-12-22 VITALS — BP 97/67 | HR 79 | Temp 98.3°F | Wt 187.2 lb

## 2021-12-22 DIAGNOSIS — F330 Major depressive disorder, recurrent, mild: Secondary | ICD-10-CM | POA: Diagnosis not present

## 2021-12-22 DIAGNOSIS — F411 Generalized anxiety disorder: Secondary | ICD-10-CM | POA: Diagnosis not present

## 2021-12-22 DIAGNOSIS — E871 Hypo-osmolality and hyponatremia: Secondary | ICD-10-CM

## 2021-12-22 DIAGNOSIS — G4700 Insomnia, unspecified: Secondary | ICD-10-CM

## 2021-12-22 MED ORDER — BUSPIRONE HCL 10 MG PO TABS: 5.0000 mg | ORAL_TABLET | Freq: Two times a day (BID) | ORAL | 1 refills | Status: DC

## 2021-12-22 MED ORDER — BUPROPION HCL ER (XL) 300 MG PO TB24: 300.0000 mg | ORAL_TABLET | Freq: Every day | ORAL | 2 refills | Status: DC

## 2021-12-22 MED ORDER — BUPROPION HCL ER (XL) 150 MG PO TB24: 150.0000 mg | ORAL_TABLET | Freq: Every day | ORAL | 2 refills | Status: DC

## 2021-12-22 MED ORDER — DULOXETINE HCL 30 MG PO CPEP: 90.0000 mg | ORAL_CAPSULE | Freq: Every day | ORAL | 2 refills | Status: DC

## 2021-12-22 MED ORDER — CLONAZEPAM 1 MG PO TABS: 0.5000 mg | ORAL_TABLET | Freq: Two times a day (BID) | ORAL | 1 refills | Status: DC

## 2021-12-22 NOTE — Patient Instructions (Signed)
Continue duloxetine 90 mg daily  Continue bupropion 450 mg daily Increase Buspar 10 mg twice a day Continue Trazodone 50  mg at night as needed for sleep Continue clonazepam 0.5-1 mg twice a day as needed for anxiety Next appointment- 10/2 at 3 PM

## 2022-02-02 DIAGNOSIS — E785 Hyperlipidemia, unspecified: Secondary | ICD-10-CM | POA: Insufficient documentation

## 2022-02-04 ENCOUNTER — Other Ambulatory Visit: Payer: Self-pay | Admitting: Psychiatry

## 2022-02-05 ENCOUNTER — Other Ambulatory Visit: Payer: Self-pay | Admitting: Psychiatry

## 2022-02-05 ENCOUNTER — Telehealth: Payer: Self-pay

## 2022-02-05 MED ORDER — BUSPIRONE HCL 10 MG PO TABS: 10.0000 mg | ORAL_TABLET | Freq: Two times a day (BID) | ORAL | 0 refills | Status: DC

## 2022-02-05 MED ORDER — TRAZODONE HCL 100 MG PO TABS: 100.0000 mg | ORAL_TABLET | Freq: Every day | ORAL | 2 refills | Status: DC

## 2022-02-05 NOTE — Telephone Encounter (Signed)
per the pharmacy they need a rx on the trazodone and on the buspiprone.  for the buspiprone can a 5mg  bid be sent in they prepack the pills and they don;t cut in 1/2.

## 2022-02-05 NOTE — Telephone Encounter (Signed)
Ordered. Buspirone- it was advised to uptitrate to 10 mg twice a day. Sent in a new order, fyi.

## 2022-02-06 NOTE — Telephone Encounter (Signed)
Left message that rx were sent to pharmacy

## 2022-02-07 NOTE — Progress Notes (Unsigned)
BH MD/PA/NP OP Progress Note  02/07/2022 11:19 AM Ricky [name]  MRN:  [id number]  Chief Complaint: No chief complaint on file.  HPI: *** Visit Diagnosis: No diagnosis found.  Past Psychiatric History: Please see initial evaluation for full details. I have reviewed the history. No updates at this time.     Past Medical History:  Past Medical History:  Diagnosis Date   Anxiety    Arthritis    Carpal tunnel syndrome    Depression    Diabetes (HCC)    Hyperlipidemia    Hypertension    Loss of teeth due to extraction    Parkinson's disease [facility])     Past Surgical History:  Procedure Laterality Date   CARPAL TUNNEL RELEASE Right 11/28/2020   Procedure: CARPAL TUNNEL RELEASE ENDOSCOPIC;  Surgeon: Christena Flake, MD;  Location: [facility] ORS;  Service: Orthopedics;  Laterality: Right;   CARPAL TUNNEL RELEASE Left 01/22/2021   Procedure: CARPAL TUNNEL RELEASE ENDOSCOPIC;  Surgeon: Christena Flake, MD;  Location: [facility] ORS;  Service: Orthopedics;  Laterality: Left;   KNEE ARTHROSCOPY Right     Family Psychiatric History: Please see initial evaluation for full details. I have reviewed the history. No updates at this time.     Family History:  Family History  Problem Relation Age of Onset   Diabetes Mother    Hypertension Mother    CVA Father    Heart attack Father    Depression Sister    Depression Brother     Social History:  Social History   Socioeconomic History   Marital status: Single    Spouse name: [name]   Number of children: Not on file   Years of education: Not on file   Highest education level: Not on file  Occupational History   Not on file  Tobacco Use   Smoking status: Never   Smokeless tobacco: Never  Vaping Use   Vaping Use: Never used  Substance and Sexual Activity   Alcohol use: Never   Drug use: Never   Sexual activity: Not on file  Other Topics Concern   Not on file  Social History Narrative   Lives with friend   Social Determinants of Health    Financial Resource Strain: Not on file  Food Insecurity: Not on file  Transportation Needs: Not on file  Physical Activity: Not on file  Stress: Not on file  Social Connections: Not on file    Allergies:  Allergies  Allergen Reactions   Mirtazapine Rash    Metabolic Disorder Labs: Lab Results  Component Value Date   HGBA1C 5.2 08/25/2021   MPG 102.54 08/25/2021   No results found for: "PROLACTIN" No results found for: "CHOL", "TRIG", "HDL", "CHOLHDL", "VLDL", "LDLCALC" Lab Results  Component Value Date   TSH 3.886 08/25/2021    Therapeutic Level Labs: No results found for: "LITHIUM" No results found for: "VALPROATE" No results found for: "CBMZ"  Current Medications: Current Outpatient Medications  Medication Sig Dispense Refill   ARIPiprazole (ABILIFY) 30 MG tablet Take 30 mg by mouth daily.     atenolol (TENORMIN) 50 MG tablet Take 50 mg by mouth daily.     buPROPion (WELLBUTRIN XL) 150 MG 24 hr tablet Take 1 tablet (150 mg total) by mouth daily. Total of 450 mg daily. Take along with 300 mg tab 30 tablet 2   buPROPion (WELLBUTRIN XL) 300 MG 24 hr tablet Take 1 tablet (300 mg total) by mouth daily. Total of 450 mg daily.  Take along with 150 mg tab 30 tablet 2   busPIRone (BUSPAR) 10 MG tablet Take 1 tablet (10 mg total) by mouth 2 (two) times daily. 60 tablet 0   carbidopa-levodopa (SINEMET IR) 25-100 MG tablet Take 1 tablet by mouth 5 (five) times daily.     clonazePAM (KLONOPIN) 1 MG tablet Take 0.5-1 tablets (0.5-1 mg total) by mouth 2 (two) times daily. 60 tablet 1   DULoxetine (CYMBALTA) 30 MG capsule Take 3 capsules (90 mg total) by mouth daily. 90 capsule 2   enalapril (VASOTEC) 10 MG tablet Take 10 mg by mouth daily.     ketoconazole (NIZORAL) 2 % shampoo Apply 1 application topically every other day.     rosuvastatin (CRESTOR) 20 MG tablet Take 20 mg by mouth daily.     sildenafil (VIAGRA) 50 MG tablet Take 50 mg by mouth daily as needed for erectile  dysfunction.     traZODone (DESYREL) 100 MG tablet Take 1 tablet (100 mg total) by mouth at bedtime. 30 tablet 2   VICTOZA 18 MG/3ML SOPN Inject 1.2 mg into the skin daily.     No current facility-administered medications for this visit.     Musculoskeletal: Strength & Muscle Tone: within normal limits Gait & Station: normal Patient leans: N/A  Psychiatric Specialty Exam: Review of Systems  There were no vitals taken for this visit.There is no height or weight on file to calculate BMI.  General Appearance: {Appearance:22683}  Eye Contact:  {BHH EYE CONTACT:22684}  Speech:  Clear and Coherent  Volume:  Normal  Mood:  {BHH MOOD:22306}  Affect:  {Affect (PAA):22687}  Thought Process:  Coherent  Orientation:  Full (Time, Place, and Person)  Thought Content: Logical   Suicidal Thoughts:  {ST/HT (PAA):22692}  Homicidal Thoughts:  {ST/HT (PAA):22692}  Memory:  Immediate;   Good  Judgement:  {Judgement (PAA):22694}  Insight:  {Insight (PAA):22695}  Psychomotor Activity:  Normal  Concentration:  Concentration: Good and Attention Span: Good  Recall:  Good  Fund of Knowledge: Good  Language: Good  Akathisia:  No  Handed:  Right  AIMS (if indicated): not done  Assets:  Communication Skills Desire for Improvement  ADL's:  Intact  Cognition: WNL  Sleep:  {BHH GOOD/FAIR/POOR:22877}   Screenings: ECT-MADRS    Flowsheet Row ECT Treatment from 07/28/2021 in [facility]  MADRS Total Score 41      GAD-7    Flowsheet Row Office Visit from 12/22/2021 in [facility]  Total GAD-7 Score 8      Mini-Mental    Flowsheet Row ECT Treatment from 07/28/2021 in [facility]  Total Score (max 30 points ) 30      PHQ2-9    Flowsheet Row Office Visit from 12/22/2021 in [facility] Office Visit from 11/24/2021 in [facility] Office Visit  from 10/23/2021 in [facility] Office Visit from 09/18/2021 in [facility] Office Visit from 04/07/2021 in [facility]  PHQ-2 Total Score 4 2 6 6 6   PHQ-9 Total Score 12 6 14 19 22       Flowsheet Row Office Visit from 12/22/2021 in [facility] Office Visit from 11/24/2021 in [facility] Office Visit from 10/23/2021 in [facility]  C-SSRS RISK CATEGORY No Risk No Risk No Risk        Assessment and Plan:  Ricky [name] is a 66  y.o. year old male with a history of depression, anxiety, parkinsonism on levodopa, diabetes, hypertension, hyperlipidemia, carpal tunnel syndrome, s/p knee arthroscopy, who presents for follow up appointment for below.   1. MDD (major depressive disorder), recurrent episode, mild ([place]) 2. Anxiety state There has been overall improvement in anxiety and depressive symptoms since starting /uptitration of BuSpar. Psychosocial stressors includes demoralization secondary to pain and unemployment.  Will do further uptitration of BuSpar to optimize treatment for anxiety.  Will continue duloxetine and bupropion to target depression.  Will continue clonazepam as needed for anxiety.  Will consider tapering down off this medication in the future to avoid potential side effect if he is able to tolerate this.    3. Insomnia, unspecified type Improving.  Will continue current dose of trazodone as needed for insomnia. Although he was reportedly having snoring when he sleeps deeply, his wife is not interested in having sleep evaluation with the thought that he does not have this condition.  They are informed to notify this clinician if interested in making a referral.    # low BP He has low blood pressure on today's exam.  He has occasional dizziness.  He was advised to contact his PCP for father evaluation/treatment.     4. Hyponatremia He has a history of hyponatremia, which is being resolved.  He has an upcoming appointment with his PCP.    # Alcohol use disorder in sustained remission Unchanged. He denies any alcohol use/craving for alcohol.  Will continue motivational interview.   Plan  Continue duloxetine 90 mg daily  Continue bupropion 450 mg daily Increase Buspar 10 mg twice a day Continue Trazodone 50  mg at night as needed for sleep Continue clonazepam 0.5-1 mg twice a day as needed for anxiety Next appointment- 10/2 at 3 PM for 30 mins, in person - he sees a therapist - According to the chart review, he may still take Abilify 30 mg daily. Will check this at the follow up appointment.    Addendum: contacted the pharmacy. Bupropion 150/300 mg was filled on 10/18 for 30 days With the information above, this clinician contacted the patient again.  They stated that they have been taking the bupropion regularly.  There is a bottle of medication, which was prescribed in September for a month.  Both agreed to continue taking the current dose of bupropion regularly.    Past trials of medication: lexapro, duloxetine, venlafaxine, mirtazapine (rash), Abilify, clonazepam,    The patient demonstrates the following risk factors for suicide: Chronic risk factors for suicide include: psychiatric disorder of depression and chronic pain. Acute risk factors for suicide include: unemployment. Protective factors for this patient include: positive social support, coping skills and hope for the future. Considering these factors, the overall suicide risk at this point appears to be low. Patient is appropriate for outpatient follow up.          Collaboration of Care: Collaboration of Care: {BH OP Collaboration of Care:[id number]}  Patient/Guardian was advised Release of Information must be obtained prior to any record release in order to collaborate their care with an outside provider. Patient/Guardian was advised if  they have not already done so to contact the registration department to sign all necessary forms in order for [place] to release information regarding their care.   Consent: Patient/Guardian gives verbal consent for treatment and assignment of benefits for services provided during this visit. Patient/Guardian expressed understanding and agreed to proceed.    Norman Clay, MD 02/07/2022, 11:19  AM

## 2022-02-09 ENCOUNTER — Ambulatory Visit: Payer: 59 | Admitting: Psychiatry

## 2022-02-09 ENCOUNTER — Encounter: Payer: Self-pay | Admitting: Psychiatry

## 2022-02-09 VITALS — BP 110/76 | HR 69 | Temp 98.3°F | Wt 190.0 lb

## 2022-02-09 DIAGNOSIS — F411 Generalized anxiety disorder: Secondary | ICD-10-CM | POA: Diagnosis not present

## 2022-02-09 DIAGNOSIS — F330 Major depressive disorder, recurrent, mild: Secondary | ICD-10-CM | POA: Diagnosis not present

## 2022-02-09 DIAGNOSIS — G4700 Insomnia, unspecified: Secondary | ICD-10-CM

## 2022-02-09 MED ORDER — BUSPIRONE HCL 10 MG PO TABS: 10.0000 mg | ORAL_TABLET | Freq: Two times a day (BID) | ORAL | 0 refills | Status: DC

## 2022-03-25 ENCOUNTER — Other Ambulatory Visit: Payer: Self-pay | Admitting: Psychiatry

## 2022-03-28 NOTE — Progress Notes (Unsigned)
[place] MD/PA/NP OP Progress Note  03/31/2022 9:17 AM Ricky [name]  MRN:  [id number]  Chief Complaint:  Chief Complaint  Patient presents with   Follow-up   HPI:  This is a follow-up appointment for depression and anxiety.  He scored PHQ9/GAD 7 prior to the appointment, and states that "that's what I am feeling."  He tends to stay in the house.  He thinks today is a perfect weather for him (raining).  Although he still goes outside, he is not enjoying as he used to.  He is concerned about his father, who found out to have liver cancer.  He is 64 year old, and lives besides him.  Although he is concerned about his father, it is not bothering him during the day.  He thinks he has worsening in restless leg.  He also feels the similar way in his arm when he tries to go to sleep.  It alleviates to some extent when he moves.  The patient has mood symptoms as in PHQ-9/GAD-7.  He denies SI.  He denies hallucinations.  He denies alcohol use or drug use.   Medication- he has been taking Abilify 30 mg daily.   Specimen: Blood  Ref Range & Units 4 mo ago  Ferritin 23 - 336 ng/mL 79   Hb 15.7  Ref Range & Units 2 mo ago   Glucose 70 - 110 mg/dL 119 High   Sodium 136 - 145 mmol/L 141  Potassium 3.6 - 5.1 mmol/L 3.9  Chloride 97 - 109 mmol/L 102  Carbon Dioxide (CO2) 22.0 - 32.0 mmol/L 30.6  Urea Nitrogen (BUN) 7 - 25 mg/dL 11  Creatinine 0.7 - 1.3 mg/dL 0.9  Glomerular Filtration Rate (eGFR) >60 mL/min/1.73sq m 85  Calcium 8.7 - 10.3 mg/dL 9.8  AST 8 - 39 U/L 20  ALT 6 - 57 U/L 31  Alk Phos (alkaline Phosphatase) 34 - 104 U/L 45  Albumin 3.5 - 4.8 g/dL 4.9 High   Bilirubin, Total 0.3 - 1.2 mg/dL 1.1  Protein, Total 6.1 - 7.9 g/dL 7.2  A/G Ratio 1.0 - 5.0 gm/dL 2.1    Wt Readings from Last 3 Encounters:  03/31/22 195 lb (88.5 kg)  02/09/22 190 lb (86.2 kg)  12/22/21 187 lb 3.2 oz (84.9 kg)     Visit Diagnosis:    ICD-10-CM   1. Restless leg syndrome  G25.81 Ferritin    2. MDD (major  depressive disorder), recurrent episode, mild ([place])  F33.0     3. Anxiety state  F41.1     4. Insomnia, unspecified type  G47.00       Past Psychiatric History: Please see initial evaluation for full details. I have reviewed the history. No updates at this time.     Past Medical History:  Past Medical History:  Diagnosis Date   Anxiety    Arthritis    Carpal tunnel syndrome    Depression    Diabetes ([place])    Hyperlipidemia    Hypertension    Loss of teeth due to extraction    Parkinson's disease     Past Surgical History:  Procedure Laterality Date   CARPAL TUNNEL RELEASE Right 11/28/2020   Procedure: CARPAL TUNNEL RELEASE ENDOSCOPIC;  Surgeon: Corky Mull, MD;  Location: [facility] ORS;  Service: Orthopedics;  Laterality: Right;   CARPAL TUNNEL RELEASE Left 01/22/2021   Procedure: CARPAL TUNNEL RELEASE ENDOSCOPIC;  Surgeon: Corky Mull, MD;  Location: [facility] ORS;  Service: Orthopedics;  Laterality: Left;  KNEE ARTHROSCOPY Right     Family Psychiatric History: Please see initial evaluation for full details. I have reviewed the history. No updates at this time.     Family History:  Family History  Problem Relation Age of Onset   Diabetes Mother    Hypertension Mother    CVA Father    Heart attack Father    Depression Sister    Depression Brother     Social History:  Social History   Socioeconomic History   Marital status: Single    Spouse name: Butch Penny   Number of children: Not on file   Years of education: Not on file   Highest education level: Not on file  Occupational History   Not on file  Tobacco Use   Smoking status: Never   Smokeless tobacco: Never  Vaping Use   Vaping Use: Never used  Substance and Sexual Activity   Alcohol use: Never   Drug use: Never   Sexual activity: Not on file  Other Topics Concern   Not on file  Social History Narrative   Lives with friend   Social Determinants of Health   Financial Resource Strain: Not on file  Food  Insecurity: Not on file  Transportation Needs: Not on file  Physical Activity: Not on file  Stress: Not on file  Social Connections: Not on file    Allergies:  Allergies  Allergen Reactions   Mirtazapine Rash    Metabolic Disorder Labs: Lab Results  Component Value Date   HGBA1C 5.2 08/25/2021   MPG 102.54 08/25/2021   No results found for: "PROLACTIN" No results found for: "CHOL", "TRIG", "HDL", "CHOLHDL", "VLDL", "LDLCALC" Lab Results  Component Value Date   TSH 3.886 08/25/2021    Therapeutic Level Labs: No results found for: "LITHIUM" No results found for: "VALPROATE" No results found for: "CBMZ"  Current Medications: Current Outpatient Medications  Medication Sig Dispense Refill   ARIPiprazole (ABILIFY) 30 MG tablet Take 30 mg by mouth daily.     atenolol (TENORMIN) 50 MG tablet Take 50 mg by mouth daily.     carbidopa-levodopa (SINEMET IR) 25-100 MG tablet Take 1 tablet by mouth 5 (five) times daily.     clonazePAM (KLONOPIN) 1 MG tablet Take 0.5-1 tablets (0.5-1 mg total) by mouth 2 (two) times daily as needed for anxiety. 60 tablet 0   DULoxetine (CYMBALTA) 30 MG capsule Take 3 capsules (90 mg total) by mouth daily. 90 capsule 2   ketoconazole (NIZORAL) 2 % shampoo Apply 1 application topically every other day.     rosuvastatin (CRESTOR) 20 MG tablet Take 20 mg by mouth daily.     traZODone (DESYREL) 100 MG tablet Take 1 tablet (100 mg total) by mouth at bedtime. 30 tablet 2   VICTOZA 18 MG/3ML SOPN Inject 1.2 mg into the skin daily.     buPROPion (WELLBUTRIN XL) 150 MG 24 hr tablet Take 1 tablet (150 mg total) by mouth daily. Total of 450 mg daily. Take along with 300 mg tab 30 tablet 2   buPROPion (WELLBUTRIN XL) 300 MG 24 hr tablet Take 1 tablet (300 mg total) by mouth daily. Total of 450 mg daily. Take along with 150 mg tab 30 tablet 2   busPIRone (BUSPAR) 7.5 MG tablet Take 1 tablet (7.5 mg total) by mouth 2 (two) times daily. 60 tablet 0   enalapril  (VASOTEC) 10 MG tablet Take 10 mg by mouth daily. (Patient not taking: Reported on 03/31/2022)  sildenafil (VIAGRA) 50 MG tablet Take 50 mg by mouth daily as needed for erectile dysfunction. (Patient not taking: Reported on 03/31/2022)     No current facility-administered medications for this visit.     Musculoskeletal: Strength & Muscle Tone:  normal Gait & Station: normal Patient leans: N/A  Psychiatric Specialty Exam: Review of Systems  Psychiatric/Behavioral:  Positive for decreased concentration, dysphoric mood and sleep disturbance. Negative for agitation, behavioral problems, confusion, hallucinations, self-injury and suicidal ideas. The patient is nervous/anxious. The patient is not hyperactive.   All other systems reviewed and are negative.   Blood pressure 108/67, pulse 73, temperature 97.9 F (36.6 C), temperature source Oral, height 5' 8.5" (1.74 m), weight 195 lb (88.5 kg).Body mass index is 29.22 kg/m.  General Appearance: Fairly Groomed  Eye Contact:  Good  Speech:  Clear and Coherent  Volume:  Normal  Mood:  Anxious  Affect:  Appropriate, Congruent, and Restricted  Thought Process:  Coherent  Orientation:  Full (Time, Place, and Person)  Thought Content: Logical   Suicidal Thoughts:  No  Homicidal Thoughts:  No  Memory:  Immediate;   Good  Judgement:  Good  Insight:  Good  Psychomotor Activity:   mild cogwheel rigidity on bilateral arm  Concentration:  Concentration: Good and Attention Span: Good  Recall:  Good  Fund of Knowledge: Good  Language: Good  Akathisia:  No  Handed:  Right  AIMS (if indicated): not done  Assets:  Communication Skills Desire for Improvement  ADL's:  Intact  Cognition: WNL  Sleep:  Poor   Screenings: ECT-MADRS    Flowsheet Row ECT Treatment from 07/28/2021 in [place] Total Score [place] Office Visit from 03/31/2022 in [place] Office Visit from 02/09/2022 in [place] Office Visit from 12/22/2021 in [place]  Total GAD-7 Score _0 Mini-Mental    Flowsheet Row ECT Treatment from 07/28/2021 in [facility]  Total Score (max 30 points ) 30      PHQ2-9    [place] Visit from 03/31/2022 in [place] Office Visit from 02/09/2022 in [place] Office Visit from 12/22/2021 in [place] Office Visit from 11/24/2021 in [facility] Office Visit from 10/23/2021 in [place]  PHQ-2 Total Score _1 PHQ-9 Total Score _2 [place] Office Visit from 03/31/2022 in [place] Office Visit from 02/09/2022 in [place] Office Visit from 12/22/2021 in [place] No Risk No Risk No Risk        Assessment and Plan:  Ricky [name] is a 65 y.o. year old male with a history of depression, anxiety, parkinsonism on levodopa, diabetes, hypertension, hyperlipidemia, carpal tunnel syndrome, s/p knee arthroscopy , who presents for follow up appointment for below.   1. Restless leg syndrome He reports worsening in restless leg since uptitration of BuSpar.  Will taper down BuSpar to see if it mitigates this adverse reaction.  Will check ferritin as well.   2. MDD (major depressive disorder), recurrent episode, mild ([place]) 3. Anxiety state There has been worsening in depressive symptoms over the past few months.  Recent psychosocial stressors includes  having found out of his father's diagnosis of liver cancer, although his mood has worsened prior to this use.  Other psychosocial stressors includes demoralization secondary to pain and  unemployment.  Will taper down BuSpar due to adverse reaction as described above.  Will continue current dose of duloxetine and bupropion to target depression at this time.  Noted that he has been on Abilify at least for several months; will plan to taper down in the future given he reports limited benefit from this medication. Noted that he did complete ECT with limited benefit.  Will continue clonazepam as needed for anxiety at this time given he reports significant benefit from this medication.  He is aware that this medication will be tapered off in the future to avoid potential side effect.   4. Insomnia, unspecified type Unchanged.  Will continue current dose of trazodone as needed for insomnia. Although he was reportedly having snoring when he sleeps deeply, his wife is not interested in having sleep evaluation with the thought that he does not have this condition.  They are informed to notify this clinician if interested in making a referral.    # Alcohol use disorder in sustained remission Unchanged. He denies any alcohol use/craving for alcohol.  Will continue motivational interview.   Plan  Continue duloxetine 90 mg daily  Continue bupropion 450 mg daily Decrease Buspar 7.5 mg twice a day  Continue Abilify 30 mg daily (he has been taking this at least for several months) (EKG 426 msec, 08/2021) Continue Trazodone 100  mg at night as needed for sleep Continue clonazepam 0.5-1 mg twice a day as needed for anxiety Next appointment- 1/18 at 1 PM. In person Obtain lab (ferritin)   Past trials of medication: lexapro, duloxetine, venlafaxine, mirtazapine (rash), Abilify, quetiapine, clonazepam,    The patient demonstrates the following risk factors for suicide: Chronic risk factors for suicide include: psychiatric disorder of depression and chronic pain. Acute risk factors for suicide include: unemployment. Protective factors for this patient include: positive social support, coping skills and  hope for the future. Considering these factors, the overall suicide risk at this point appears to be low. Patient is appropriate for outpatient follow up.      Collaboration of Care: Collaboration of Care: Other reviewed notes in Epic  Patient/Guardian was advised Release of Information must be obtained prior to any record release in order to collaborate their care with an outside provider. Patient/Guardian was advised if they have not already done so to contact the registration department to sign all necessary forms in order for [place] to release information regarding their care.   Consent: Patient/Guardian gives verbal consent for treatment and assignment of benefits for services provided during this visit. Patient/Guardian expressed understanding and agreed to proceed.    Norman Clay, MD 03/31/2022, 9:17 AM

## 2022-03-31 ENCOUNTER — Ambulatory Visit: Payer: 59 | Admitting: Psychiatry

## 2022-03-31 ENCOUNTER — Encounter: Payer: Self-pay | Admitting: Psychiatry

## 2022-03-31 VITALS — BP 108/67 | HR 73 | Temp 97.9°F | Ht 68.5 in | Wt 195.0 lb

## 2022-03-31 DIAGNOSIS — G2581 Restless legs syndrome: Secondary | ICD-10-CM | POA: Diagnosis not present

## 2022-03-31 DIAGNOSIS — G4700 Insomnia, unspecified: Secondary | ICD-10-CM

## 2022-03-31 DIAGNOSIS — F330 Major depressive disorder, recurrent, mild: Secondary | ICD-10-CM

## 2022-03-31 DIAGNOSIS — F411 Generalized anxiety disorder: Secondary | ICD-10-CM | POA: Diagnosis not present

## 2022-03-31 MED ORDER — BUSPIRONE HCL 7.5 MG PO TABS: 7.5000 mg | ORAL_TABLET | Freq: Two times a day (BID) | ORAL | 0 refills | Status: DC

## 2022-03-31 MED ORDER — CLONAZEPAM 1 MG PO TABS: 0.5000 mg | ORAL_TABLET | Freq: Two times a day (BID) | ORAL | 0 refills | Status: DC

## 2022-03-31 NOTE — Patient Instructions (Signed)
Continue duloxetine 90 mg daily  Continue bupropion 450 mg daily Decrease Buspar 7.5 mg twice a day  Continue Abilify 30 mg daily  Continue Trazodone 100  mg at night as needed for sleep Continue clonazepam 0.5-1 mg twice a day as needed for anxiety Next appointment- 1/18 at 1 PM

## 2022-04-01 ENCOUNTER — Encounter: Payer: Self-pay | Admitting: Psychiatry

## 2022-04-01 LAB — FERRITIN: Ferritin: 261 ng/mL (ref 30–400)

## 2022-04-28 ENCOUNTER — Other Ambulatory Visit: Payer: Self-pay | Admitting: Psychiatry

## 2022-05-25 ENCOUNTER — Other Ambulatory Visit: Payer: Self-pay | Admitting: Psychiatry

## 2022-05-26 NOTE — Progress Notes (Deleted)
[facility] MD/PA/NP OP Progress Note  05/26/2022 7:59 AM Ricky [name]  MRN:  [id number]  Chief Complaint: No chief complaint on file.  HPI:    Visit Diagnosis: No diagnosis found.  Past Psychiatric History: Please see initial evaluation for full details. I have reviewed the history. No updates at this time.     Past Medical History:  Past Medical History:  Diagnosis Date   Anxiety    Arthritis    Carpal tunnel syndrome    Depression    Diabetes ([place])    Hyperlipidemia    Hypertension    Loss of teeth due to extraction    Parkinson's disease     Past Surgical History:  Procedure Laterality Date   CARPAL TUNNEL RELEASE Right 11/28/2020   Procedure: CARPAL TUNNEL RELEASE ENDOSCOPIC;  Surgeon: Corky Mull, MD;  Location: [facility] ORS;  Service: Orthopedics;  Laterality: Right;   CARPAL TUNNEL RELEASE Left 01/22/2021   Procedure: CARPAL TUNNEL RELEASE ENDOSCOPIC;  Surgeon: Corky Mull, MD;  Location: [facility] ORS;  Service: Orthopedics;  Laterality: Left;   KNEE ARTHROSCOPY Right     Family Psychiatric History: Please see initial evaluation for full details. I have reviewed the history. No updates at this time.     Family History:  Family History  Problem Relation Age of Onset   Diabetes Mother    Hypertension Mother    CVA Father    Heart attack Father    Depression Sister    Depression Brother     Social History:  Social History   Socioeconomic History   Marital status: Single    Spouse name: Butch Penny   Number of children: Not on file   Years of education: Not on file   Highest education level: Not on file  Occupational History   Not on file  Tobacco Use   Smoking status: Never   Smokeless tobacco: Never  Vaping Use   Vaping Use: Never used  Substance and Sexual Activity   Alcohol use: Never   Drug use: Never   Sexual activity: Not on file  Other Topics Concern   Not on file  Social History Narrative   Lives with friend   Social Determinants of Health    Financial Resource Strain: Not on file  Food Insecurity: Not on file  Transportation Needs: Not on file  Physical Activity: Not on file  Stress: Not on file  Social Connections: Not on file    Allergies:  Allergies  Allergen Reactions   Mirtazapine Rash    Metabolic Disorder Labs: Lab Results  Component Value Date   HGBA1C 5.2 08/25/2021   MPG 102.54 08/25/2021   No results found for: "PROLACTIN" No results found for: "CHOL", "TRIG", "HDL", "CHOLHDL", "VLDL", "LDLCALC" Lab Results  Component Value Date   TSH 3.886 08/25/2021    Therapeutic Level Labs: No results found for: "LITHIUM" No results found for: "VALPROATE" No results found for: "CBMZ"  Current Medications: Current Outpatient Medications  Medication Sig Dispense Refill   ARIPiprazole (ABILIFY) 30 MG tablet Take 30 mg by mouth daily.     atenolol (TENORMIN) 50 MG tablet Take 50 mg by mouth daily.     buPROPion (WELLBUTRIN XL) 150 MG 24 hr tablet Take 1 tablet (150 mg total) by mouth daily. Total of 450 mg daily. Take along with 300 mg tab 30 tablet 2   buPROPion (WELLBUTRIN XL) 300 MG 24 hr tablet Take 1 tablet (300 mg total) by mouth daily. Total of 450 mg  daily. Take along with 150 mg tab 30 tablet 2   busPIRone (BUSPAR) 7.5 MG tablet Take 1 tablet (7.5 mg total) by mouth 2 (two) times daily. 60 tablet 0   carbidopa-levodopa (SINEMET IR) 25-100 MG tablet Take 1 tablet by mouth 5 (five) times daily.     clonazePAM (KLONOPIN) 1 MG tablet Take 0.5-1 tablets (0.5-1 mg total) by mouth 2 (two) times daily as needed for anxiety. 60 tablet 0   clonazePAM (KLONOPIN) 1 MG tablet Take 0.5-1 tablets (0.5-1 mg total) by mouth 2 (two) times daily. 60 tablet 0   [START ON 05/29/2022] DULoxetine (CYMBALTA) 30 MG capsule Take 3 capsules (90 mg total) by mouth daily. 90 capsule 2   enalapril (VASOTEC) 10 MG tablet Take 10 mg by mouth daily. (Patient not taking: Reported on 03/31/2022)     ketoconazole (NIZORAL) 2 % shampoo  Apply 1 application topically every other day.     rosuvastatin (CRESTOR) 20 MG tablet Take 20 mg by mouth daily.     sildenafil (VIAGRA) 50 MG tablet Take 50 mg by mouth daily as needed for erectile dysfunction. (Patient not taking: Reported on 03/31/2022)     traZODone (DESYREL) 100 MG tablet Take 1 tablet (100 mg total) by mouth at bedtime as needed for sleep. 30 tablet 2   VICTOZA 18 MG/3ML SOPN Inject 1.2 mg into the skin daily.     No current facility-administered medications for this visit.     Musculoskeletal: Strength & Muscle Tone: {desc; muscle tone:32375} Gait & Station: {PE GAIT ED [id number] Patient leans: {Patient Leans:21022755}  Psychiatric Specialty Exam: Review of Systems  There were no vitals taken for this visit.There is no height or weight on file to calculate BMI.  General Appearance: {Appearance:22683}  Eye Contact:  {BHH EYE CONTACT:22684}  Speech:  {Speech:22685}  Volume:  {Volume (PAA):22686}  Mood:  {BHH MOOD:22306}  Affect:  {Affect (PAA):22687}  Thought Process:  {Thought Process (PAA):22688}  Orientation:  {BHH ORIENTATION (PAA):22689}  Thought Content: {Thought Content:22690}   Suicidal Thoughts:  {ST/HT (PAA):22692}  Homicidal Thoughts:  {ST/HT (PAA):22692}  Memory:  {BHH MEMORY:22881}  Judgement:  {Judgement (PAA):22694}  Insight:  {Insight (PAA):22695}  Psychomotor Activity:  {Psychomotor (PAA):22696}  Concentration:  {Concentration:21399}  Recall:  {BHH GOOD/FAIR/POOR:22877}  Fund of Knowledge: {BHH GOOD/FAIR/POOR:22877}  Language: {BHH GOOD/FAIR/POOR:22877}  Akathisia:  {BHH YES OR NO:22294}  Handed:  {Handed:22697}  AIMS (if indicated): {Desc; done/not:10129}  Assets:  {Assets (PAA):22698}  ADL's:  {BHH TW:9249394  Cognition: {chl bhh cognition:304700322}  Sleep:  {BHH GOOD/FAIR/POOR:22877}   Screenings: ECT-MADRS    Flowsheet Row ECT Treatment from 07/28/2021 in [place] Total Score [place] Office Visit from 03/31/2022 in [place] Office Visit from 02/09/2022 in [facility] Office Visit from 12/22/2021 in [facility]  Total GAD-7 Score 21 12 8      $ Mini-Mental    Flowsheet Row ECT Treatment from 07/28/2021 in [place]  Total Score (max 30 points ) 30      PHQ2-9    [place] Visit from 03/31/2022 in [facility] Office Visit from 02/09/2022 in [place] Office Visit from 12/22/2021 in [facility] [place] Office Visit from 11/24/2021 in [place] Office Visit from 10/23/2021 in [place]  PHQ-2 Total Score 6 6 4 $ 2  6  PHQ-9 Total Score 19 18 12 6 14      $ Flowsheet Row Office Visit from 03/31/2022 in [facility] [facility] Office Visit from 02/09/2022 in [facility] Office Visit from 12/22/2021 in [facility] No Risk No Risk No Risk        Assessment and Plan:  Ricky [name] is a 67 y.o. year old male with a history of , who presents for follow up appointment for below.          Ricky [name] is a 67 y.o. year old male with a history of depression, anxiety, parkinsonism on levodopa, diabetes, hypertension, hyperlipidemia, carpal tunnel syndrome, s/p knee arthroscopy , who presents for follow up appointment for below.    1. Restless leg syndrome He reports worsening in restless leg since uptitration of BuSpar.  Will taper down BuSpar to see if it mitigates this adverse reaction.  Will check ferritin as well.    2. MDD (major depressive disorder), recurrent episode, mild ([place]) 3. Anxiety state There has been worsening in depressive symptoms over the past few months.   Recent psychosocial stressors includes having found out of his father's diagnosis of liver cancer, although his mood has worsened prior to this use.  Other psychosocial stressors includes demoralization secondary to pain and unemployment.  Will taper down BuSpar due to adverse reaction as described above.  Will continue current dose of duloxetine and bupropion to target depression at this time.  Noted that he has been on Abilify at least for several months; will plan to taper down in the future given he reports limited benefit from this medication. Noted that he did complete ECT with limited benefit.  Will continue clonazepam as needed for anxiety at this time given he reports significant benefit from this medication.  He is aware that this medication will be tapered off in the future to avoid potential side effect.    4. Insomnia, unspecified type Unchanged.  Will continue current dose of trazodone as needed for insomnia. Although he was reportedly having snoring when he sleeps deeply, his wife is not interested in having sleep evaluation with the thought that he does not have this condition.  They are informed to notify this clinician if interested in making a referral.    # Alcohol use disorder in sustained remission Unchanged. He denies any alcohol use/craving for alcohol.  Will continue motivational interview.   Plan  Continue duloxetine 90 mg daily  Continue bupropion 450 mg daily Decrease Buspar 7.5 mg twice a day  Continue Abilify 30 mg daily (he has been taking this at least for several months) (EKG 426 msec, 08/2021) Continue Trazodone 100  mg at night as needed for sleep Continue clonazepam 0.5-1 mg twice a day as needed for anxiety Next appointment- 1/18 at 1 PM. In person Obtain lab (ferritin)   Past trials of medication: lexapro, duloxetine, venlafaxine, mirtazapine (rash), Abilify, quetiapine, clonazepam,    The patient demonstrates the following risk factors for suicide: Chronic  risk factors for suicide include: psychiatric disorder of depression and chronic pain. Acute risk factors for suicide include: unemployment. Protective factors for this patient include: positive social support, coping skills and hope for the future. Considering these factors, the overall suicide risk at this point appears to be low. Patient is appropriate for outpatient follow up.     Collaboration of Care: Collaboration of Care: {[facility] OP Collaboration of [id number]  Patient/Guardian was advised Release of Information  must be obtained prior to any record release in order to collaborate their care with an outside provider. Patient/Guardian was advised if they have not already done so to contact the registration department to sign all necessary forms in order for [place] to release information regarding their care.   Consent: Patient/Guardian gives verbal consent for treatment and assignment of benefits for services provided during this visit. Patient/Guardian expressed understanding and agreed to proceed.    Norman Clay, MD 05/26/2022, 7:59 AM

## 2022-05-28 ENCOUNTER — Ambulatory Visit: Payer: 59 | Admitting: Psychiatry

## 2022-06-01 ENCOUNTER — Telehealth: Payer: Self-pay

## 2022-06-01 ENCOUNTER — Other Ambulatory Visit: Payer: Self-pay | Admitting: Psychiatry

## 2022-06-01 MED ORDER — CLONAZEPAM 1 MG PO TABS: 0.5000 mg | ORAL_TABLET | Freq: Two times a day (BID) | ORAL | 0 refills | Status: DC | PRN

## 2022-06-01 MED ORDER — BUPROPION HCL ER (XL) 300 MG PO TB24: 300.0000 mg | ORAL_TABLET | Freq: Every day | ORAL | 5 refills | Status: DC

## 2022-06-01 MED ORDER — BUSPIRONE HCL 7.5 MG PO TABS: 7.5000 mg | ORAL_TABLET | Freq: Two times a day (BID) | ORAL | 0 refills | Status: DC

## 2022-06-01 MED ORDER — BUPROPION HCL ER (XL) 150 MG PO TB24: 150.0000 mg | ORAL_TABLET | Freq: Every day | ORAL | 5 refills | Status: DC

## 2022-06-01 NOTE — Telephone Encounter (Signed)
received fax requesting a refill on the buspirone hcl 7.5mg . the bupriopion 300mg  and 150mg , and clonazepam 1mg   Pt last seen on 11-21 next appt 06-16-22

## 2022-06-13 NOTE — Progress Notes (Unsigned)
BH MD/PA/NP OP Progress Note  06/16/2022 2:44 PM Ricky [name]  MRN:  [id number]  Chief Complaint:  Chief Complaint  Patient presents with   Follow-up   HPI:  This is a follow-up appointment for depression and anxiety.  He states he has been doing a little better.  However, he is concerned about his father, who is in hospice.  He was told that his father has 2 weeks to live.  He has been trying to help his mother, which makes him keep busy.  He reports frustration of not being able to take higher dose of clonazepam, stating that he has been on this for more than 20 years.  He thinks Klonopin works the best more than any other medication.  He has insomnia. The patient has mood symptoms as in PHQ-9/GAD-7. He denies SI. He denies alcohol use or drug use. He wonders if a higher dose of Buspar would be beneficial. He was informed that a higher dose caused restless legs, but this symptom is improving now.  His wife presents to the visit.  She states that she can see that he is depressed.  He is in a recliner, thinking about his father. She is concerned what might happen if anything were to happen to his father. He has been taking Abilify.    Visit Diagnosis:    ICD-10-CM   1. MDD (major depressive disorder), recurrent episode, moderate (HCC)  F33.1     2. Anxiety state  F41.1     3. Insomnia, unspecified type  G47.00       Past Psychiatric History: Please see initial evaluation for full details. I have reviewed the history. No updates at this time.     Past Medical History:  Past Medical History:  Diagnosis Date   Anxiety    Arthritis    Carpal tunnel syndrome    Depression    Diabetes ([place])    Hyperlipidemia    Hypertension    Loss of teeth due to extraction    Parkinson's disease     Past Surgical History:  Procedure Laterality Date   CARPAL TUNNEL RELEASE Right 11/28/2020   Procedure: CARPAL TUNNEL RELEASE ENDOSCOPIC;  Surgeon: Corky Mull, MD;  Location: [facility] ORS;   Service: Orthopedics;  Laterality: Right;   CARPAL TUNNEL RELEASE Left 01/22/2021   Procedure: CARPAL TUNNEL RELEASE ENDOSCOPIC;  Surgeon: Corky Mull, MD;  Location: [facility] ORS;  Service: Orthopedics;  Laterality: Left;   KNEE ARTHROSCOPY Right     Family Psychiatric History: Please see initial evaluation for full details. I have reviewed the history. No updates at this time.    Family History:  Family History  Problem Relation Age of Onset   Diabetes Mother    Hypertension Mother    CVA Father    Heart attack Father    Depression Sister    Depression Brother     Social History:  Social History   Socioeconomic History   Marital status: Single    Spouse name: Butch Penny   Number of children: Not on file   Years of education: Not on file   Highest education level: Not on file  Occupational History   Not on file  Tobacco Use   Smoking status: Never   Smokeless tobacco: Never  Vaping Use   Vaping Use: Never used  Substance and Sexual Activity   Alcohol use: Never   Drug use: Never   Sexual activity: Not on file  Other Topics Concern   Not  on file  Social History Narrative   Lives with friend   Social Determinants of Health   Financial Resource Strain: Not on file  Food Insecurity: Not on file  Transportation Needs: Not on file  Physical Activity: Not on file  Stress: Not on file  Social Connections: Not on file    Allergies:  Allergies  Allergen Reactions   Mirtazapine Rash    Metabolic Disorder Labs: Lab Results  Component Value Date   HGBA1C 5.2 08/25/2021   MPG 102.54 08/25/2021   No results found for: "PROLACTIN" No results found for: "CHOL", "TRIG", "HDL", "CHOLHDL", "VLDL", "LDLCALC" Lab Results  Component Value Date   TSH 3.886 08/25/2021    Therapeutic Level Labs: No results found for: "LITHIUM" No results found for: "VALPROATE" No results found for: "CBMZ"  Current Medications: Current Outpatient Medications  Medication Sig Dispense  Refill   ARIPiprazole (ABILIFY) 30 MG tablet Take 30 mg by mouth daily.     atenolol (TENORMIN) 50 MG tablet Take 50 mg by mouth daily.     buPROPion (WELLBUTRIN XL) 150 MG 24 hr tablet Take 1 tablet (150 mg total) by mouth daily. Total of 450 mg daily. Take along with 300 mg tab 30 tablet 5   buPROPion (WELLBUTRIN XL) 300 MG 24 hr tablet Take 1 tablet (300 mg total) by mouth daily. Total of 450 mg daily. Take along with 150 mg tab 30 tablet 5   carbidopa-levodopa (SINEMET IR) 25-100 MG tablet Take 1 tablet by mouth 5 (five) times daily.     DULoxetine (CYMBALTA) 30 MG capsule Take 3 capsules (90 mg total) by mouth daily. 90 capsule 2   enalapril (VASOTEC) 10 MG tablet Take 10 mg by mouth daily.     ketoconazole (NIZORAL) 2 % shampoo Apply 1 application topically every other day.     rosuvastatin (CRESTOR) 20 MG tablet Take 20 mg by mouth daily.     sildenafil (VIAGRA) 50 MG tablet Take 50 mg by mouth daily as needed for erectile dysfunction.     VICTOZA 18 MG/3ML SOPN Inject 1.2 mg into the skin daily.     [START ON 07/01/2022] busPIRone (BUSPAR) 7.5 MG tablet Take 1 tablet (7.5 mg total) by mouth 2 (two) times daily. 60 tablet 1   clonazePAM (KLONOPIN) 1 MG tablet Take 0.5-1 tablets (0.5-1 mg total) by mouth 2 (two) times daily. 60 tablet 0   [START ON 07/01/2022] clonazePAM (KLONOPIN) 1 MG tablet Take 0.5-1 tablets (0.5-1 mg total) by mouth 2 (two) times daily as needed for anxiety. 60 tablet 1   [START ON 07/27/2022] traZODone (DESYREL) 100 MG tablet Take 1 tablet (100 mg total) by mouth at bedtime as needed for sleep. 30 tablet 0   No current facility-administered medications for this visit.     Musculoskeletal: Strength & Muscle Tone: within normal limits Gait & Station: normal Patient leans: N/A  Psychiatric Specialty Exam: Review of Systems  Psychiatric/Behavioral:  Positive for decreased concentration, dysphoric mood and sleep disturbance. Negative for agitation, behavioral  problems, confusion, hallucinations, self-injury and suicidal ideas. The patient is nervous/anxious. The patient is not hyperactive.   All other systems reviewed and are negative.   Blood pressure 118/75, pulse 70, temperature 97.9 F (36.6 C), temperature source Skin, height 5' 8.5" (1.74 m), weight 185 lb 6.4 oz (84.1 kg).Body mass index is 27.78 kg/m.  General Appearance: Fairly Groomed  Eye Contact:  Good  Speech:  Clear and Coherent  Volume:  Normal  Mood:  Depressed  Affect:  Appropriate, Congruent, and Restricted  Thought Process:  Coherent  Orientation:  Full (Time, Place, and Person)  Thought Content: Logical   Suicidal Thoughts:  No  Homicidal Thoughts:  No  Memory:  Immediate;   Good  Judgement:  Good  Insight:  Good  Psychomotor Activity:  Normal  Concentration:  Concentration: Good and Attention Span: Good  Recall:  Good  Fund of Knowledge: Good  Language: Good  Akathisia:  No  Handed:  Right  AIMS (if indicated): not done  Assets:  Communication Skills Desire for Improvement  ADL's:  Intact  Cognition: WNL  Sleep:  Poor   Screenings: ECT-MADRS    Flowsheet Row ECT Treatment from 07/28/2021 in [facility]  MADRS Total Score 41      GAD-7    Flowsheet Row Office Visit from 06/16/2022 in [facility] Office Visit from 03/31/2022 in [facility] Office Visit from 02/09/2022 in [facility] Office Visit from 12/22/2021 in [facility]  Total GAD-7 Score 20 21 12 8       Mini-Mental    Flowsheet Row ECT Treatment from 07/28/2021 in [facility]  Total Score (max 30 points ) 30      PHQ2-9    Flowsheet Row Office Visit from 06/16/2022 in [facility] Office Visit from 03/31/2022 in [facility] Office Visit from 02/09/2022 in [facility] Office Visit from 12/22/2021 in [facility] Office Visit from 11/24/2021 in [facility]  PHQ-2 Total Score 6 6 6 4 2   PHQ-9 Total Score 18 19 18 12 6       Flowsheet Row Office Visit from 03/31/2022 in [facility] Office Visit from 02/09/2022 in [facility] Office Visit from 12/22/2021 in [facility]  C-SSRS RISK CATEGORY No Risk No Risk No Risk        Assessment and Plan:  Sisto Granillo is a 67 y.o. year old male with a history of depression, anxiety, parkinsonism on levodopa, diabetes, hypertension, hyperlipidemia, carpal tunnel syndrome, s/p knee arthroscopy , who presents for follow up appointment for below.   1. MDD (major depressive disorder), recurrent episode, moderate (HCC) 2. Anxiety state Acute stressors include: his father with liver cancer in home hospice care  Other stressors include: unemployment   History: depression since 1979, ECT in 2023 with limited benefit   Unstable.  He continues to report depressive and anxiety symptoms especially in relation to acute stressor as above.  Although it was suggested to taper down the Abilify and try Vraylar, he prefers to stay on the current medication regimen at this time.  Will come into duloxetine and bupropion to target depression.  Will continue clonazepam as needed for anxiety.  He was advised to limit the use up to 60 times per month as prescribed.   3. Insomnia, unspecified type Worsening.  Will continue trazodone as needed for insomnia. Although he reportedly experiences snoring during deep sleep, his wife is not interested in pursuing a sleep evaluation, as she does not believe he has this condition. However,  they are informed to notify this clinician if they become interested in making a referral for further evaluation.  # Restless leg Improving, which  coincide it was tapering down BuSpar.  Will continue to assess.   # Alcohol use disorder in sustained remission Unchanged. He denies any alcohol use/craving for alcohol.  Will continue motivational interview.   Plan  Continue duloxetine 90 mg daily  Continue bupropion 450 mg daily Continue Buspar 7.5 mg twice a day (restless leg from 10 mg BID) Continue Abilify 30 mg daily (EKG 426 msec, 08/2021) Continue Trazodone 100  mg at night as needed for sleep Continue clonazepam 0.5-1 mg twice a day as needed for anxiety Next appointment- 4/4 at 8:30 . In person   Past trials of medication: lexapro, duloxetine, venlafaxine, mirtazapine (rash), Abilify, quetiapine, clonazepam,    The patient demonstrates the following risk factors for suicide: Chronic risk factors for suicide include: psychiatric disorder of depression and chronic pain. Acute risk factors for suicide include: unemployment. Protective factors for this patient include: positive social support, coping skills and hope for the future. Considering these factors, the overall suicide risk at this point appears to be low. Patient is appropriate for outpatient follow up.    Collaboration of Care: Collaboration of Care: Other reviewed notes in Epic  Patient/Guardian was advised Release of Information must be obtained prior to any record release in order to collaborate their care with an outside provider. Patient/Guardian was advised if they have not already done so to contact the registration department to sign all necessary forms in order for [place] to release information regarding their care.   Consent: Patient/Guardian gives verbal consent for treatment and assignment of benefits for services provided during this visit. Patient/Guardian expressed understanding and agreed to proceed.    Norman Clay, MD 06/16/2022, 2:44 PM

## 2022-06-16 ENCOUNTER — Encounter: Payer: Self-pay | Admitting: Psychiatry

## 2022-06-16 ENCOUNTER — Ambulatory Visit: Payer: 59 | Admitting: Psychiatry

## 2022-06-16 VITALS — BP 118/75 | HR 70 | Temp 97.9°F | Ht 68.5 in | Wt 185.4 lb

## 2022-06-16 DIAGNOSIS — F411 Generalized anxiety disorder: Secondary | ICD-10-CM

## 2022-06-16 DIAGNOSIS — F331 Major depressive disorder, recurrent, moderate: Secondary | ICD-10-CM | POA: Diagnosis not present

## 2022-06-16 DIAGNOSIS — G4700 Insomnia, unspecified: Secondary | ICD-10-CM

## 2022-06-16 MED ORDER — TRAZODONE HCL 100 MG PO TABS: 100.0000 mg | ORAL_TABLET | Freq: Every evening | ORAL | 0 refills | Status: DC | PRN

## 2022-06-16 MED ORDER — CLONAZEPAM 1 MG PO TABS: 0.5000 mg | ORAL_TABLET | Freq: Two times a day (BID) | ORAL | 1 refills | Status: DC | PRN

## 2022-06-16 MED ORDER — BUSPIRONE HCL 7.5 MG PO TABS: 7.5000 mg | ORAL_TABLET | Freq: Two times a day (BID) | ORAL | 1 refills | Status: DC

## 2022-08-11 NOTE — Progress Notes (Unsigned)
BH MD/PA/NP OP Progress Note  08/13/2022 9:11 AM Ricky [name]  MRN:  [id number]  Chief Complaint:  Chief Complaint  Patient presents with   Follow-up   HPI:  This is a follow-up appointment for depression, anxiety.  He states that he has been sleeping more.  He sleeps around 10 PM, and wakes up around 2 PM.  He watches TV, and dose off.  He thinks it has been progressively worse.  He feels frustrated with this condition.  He does not want to do anything.  Although he tried to [place] yard, it was aggravating.  He took clonazepam a total of 2.5 tablet a day, although he tries to limit to 2 mg/day.  He denies any fall.  His father passed away in family.  It was not as bad as expected.  Although his wife reports concern of him not being included in the family discussion, he states that it does not bother him as much as he is just depressed.  He denies any change in appetite.  He denies SI.  He denies hallucinations or paranoia.  He does not think he has issues with memory.    Substance use  Tobacco Alcohol Other substances/  Current denies denies denies  Past denies Six pack in PM, until a few years ago denies  Past Treatment  denies      Wt Readings from Last 3 Encounters:  08/13/22 186 lb 6.4 oz (84.6 kg)  06/16/22 185 lb 6.4 oz (84.1 kg)  03/31/22 195 lb (88.5 kg)      Visit Diagnosis:    ICD-10-CM   1. MDD (major depressive disorder), recurrent episode, moderate  F33.1 CBC    Ferritin    TSH    VITAMIN D 25 Hydroxy (Vit-D Deficiency, Fractures)    2. Anxiety state  F41.1     3. Insomnia, unspecified type  G47.00     4. Fatigue, unspecified type  R53.83 Ferritin    Comprehensive metabolic panel    TSH    VITAMIN D 25 Hydroxy (Vit-D Deficiency, Fractures)    5. Cognitive deficits  R41.89 Vitamin B12    Folate    6. Alcohol use disorder, moderate, in sustained remission  F10.21 Vitamin B1      Past Psychiatric History: Please see initial evaluation for full details. I have  reviewed the history. No updates at this time.     Past Medical History:  Past Medical History:  Diagnosis Date   Anxiety    Arthritis    Carpal tunnel syndrome    Depression    Diabetes    Hyperlipidemia    Hypertension    Loss of teeth due to extraction    Parkinson's disease     Past Surgical History:  Procedure Laterality Date   CARPAL TUNNEL RELEASE Right 11/28/2020   Procedure: CARPAL TUNNEL RELEASE ENDOSCOPIC;  Surgeon: Corky Mull, MD;  Location: [facility] OR[facility];  Service: Orthopedics;  Laterality: Right;   CARPAL TUNNEL RELEASE Left 01/22/2021   Procedure: CARPAL TUNNEL RELEASE ENDOSCOPIC;  Surgeon: Corky Mull, MD;  Location: [facility];  Service: Orthopedics;  Laterality: Left;   KNEE ARTHROSCOPY Right     Family Psychiatric History: Please see initial evaluation for full details. I have reviewed the history. No updates at this time.     Family History:  Family History  Problem Relation Age of Onset   Diabetes Mother    Hypertension Mother    CVA Father    Heart attack  Father    Depression Sister    Depression Brother     Social History:  Social History   Socioeconomic History   Marital status: Single    Spouse name: Ricky [name]   Number of children: Not on file   Years of education: Not on file   Highest education level: Not on file  Occupational History   Not on file  Tobacco Use   Smoking status: Never   Smokeless tobacco: Never  Vaping Use   Vaping Use: Never used  Substance and Sexual Activity   Alcohol use: Never   Drug use: Never   Sexual activity: Not on file  Other Topics Concern   Not on file  Social History Narrative   Lives with friend   Social Determinants of Health   Financial Resource Strain: Not on file  Food Insecurity: Not on file  Transportation Needs: Not on file  Physical Activity: Not on file  Stress: Not on file  Social Connections: Not on file    Allergies:  Allergies  Allergen Reactions   Mirtazapine Rash     Metabolic Disorder Labs: Lab Results  Component Value Date   HGBA1C 5.2 08/25/2021   MPG 102.54 08/25/2021   No results found for: "PROLACTIN" No results found for: "CHOL", "TRIG", "HDL", "CHOLHDL", "VLDL", "LDLCALC" Lab Results  Component Value Date   TSH 3.886 08/25/2021    Therapeutic Level Labs: No results found for: "LITHIUM" No results found for: "VALPROATE" No results found for: "CBMZ"  Current Medications: Current Outpatient Medications  Medication Sig Dispense Refill   ARIPiprazole (ABILIFY) 30 MG tablet Take 30 mg by mouth daily.     atenolol (TENORMIN) 50 MG tablet Take 50 mg by mouth daily.     buPROPion (WELLBUTRIN XL) 150 MG 24 hr tablet Take 1 tablet (150 mg total) by mouth daily. Total of 450 mg daily. Take along with 300 mg tab 30 tablet 5   buPROPion (WELLBUTRIN XL) 300 MG 24 hr tablet Take 1 tablet (300 mg total) by mouth daily. Total of 450 mg daily. Take along with 150 mg tab 30 tablet 5   busPIRone (BUSPAR) 7.5 MG tablet Take 1 tablet (7.5 mg total) by mouth 2 (two) times daily. 60 tablet 1   carbidopa-levodopa (SINEMET IR) 25-100 MG tablet Take 1 tablet by mouth 5 (five) times daily.     cariprazine (VRAYLAR) 1.5 MG capsule Take 1 capsule (1.5 mg total) by mouth daily. 30 capsule 1   clonazePAM (KLONOPIN) 1 MG tablet Take 0.5-1 tablets (0.5-1 mg total) by mouth 2 (two) times daily as needed for anxiety. 60 tablet 1   DULoxetine (CYMBALTA) 30 MG capsule Take 3 capsules (90 mg total) by mouth daily. 90 capsule 2   enalapril (VASOTEC) 10 MG tablet Take 10 mg by mouth daily.     ketoconazole (NIZORAL) 2 % shampoo Apply 1 application topically every other day.     rosuvastatin (CRESTOR) 20 MG tablet Take 20 mg by mouth daily.     sildenafil (VIAGRA) 50 MG tablet Take 50 mg by mouth daily as needed for erectile dysfunction.     traZODone (DESYREL) 100 MG tablet Take 1 tablet (100 mg total) by mouth at bedtime as needed for sleep. 30 tablet 0   VICTOZA 18  MG/3ML SOPN Inject 1.2 mg into the skin daily.     clonazePAM (KLONOPIN) 1 MG tablet Take 0.5-1 tablets (0.5-1 mg total) by mouth 2 (two) times daily. 60 tablet 0   No  current facility-administered medications for this visit.     Musculoskeletal: Strength & Muscle Tone: within normal limits Gait & Station: normal Patient leans: N/A  Psychiatric Specialty Exam: Review of Systems  Psychiatric/Behavioral:  Positive for dysphoric mood and sleep disturbance. Negative for agitation, behavioral problems, confusion, decreased concentration, hallucinations, self-injury and suicidal ideas. The patient is nervous/anxious. The patient is not hyperactive.   All other systems reviewed and are negative.   Blood pressure 104/66, pulse 74, temperature 97.7 F (36.5 C), temperature source Skin, height 5' 8.5" (1.74 m), weight 186 lb 6.4 oz (84.6 kg).Body mass index is 27.93 kg/m.  General Appearance: Fairly Groomed  Eye Contact:  Good  Speech:  Clear and Coherent  Volume:  Normal  Mood:  Depressed  Affect:  Restricted and masked face  Thought Process:  Coherent  Orientation:  Full (Time, Place, and Person)  Thought Content: Logical   Suicidal Thoughts:  No  Homicidal Thoughts:  No  Memory:  Immediate;   Good  Judgement:  Good  Insight:  Good  Psychomotor Activity:   occasional resting pill rolling tremor on right hand. No cogwheel rigidity ,no TD  Concentration:  Concentration: Good and Attention Span: Good  Recall:  Good  Fund of Knowledge: Good  Language: Good  Akathisia:  No  Handed:  Right  AIMS (if indicated): not done  Assets:  Communication Skills Desire for Improvement  ADL's:  Intact  Cognition: WNL  Sleep:   hypersomnia   Screenings: ECT-MADRS    Flowsheet Row ECT Treatment from 07/28/2021 in [place] Total Score [place] Office Visit from 06/16/2022 in [facility]  Office Visit from 03/31/2022 in [place] Office Visit from 02/09/2022 in [place] Office Visit from 12/22/2021 in [place]  Total GAD-7 Score 20 21 12 8       Mini-Mental    Flowsheet Row ECT Treatment from 07/28/2021 in [place]  Total Score (max 30 points ) 30      PHQ2-9    [place] Office Visit from 06/16/2022 in [place] Office Visit from 03/31/2022 in [place] Office Visit from 02/09/2022 in [place] Office Visit from 12/22/2021 in [place] Office Visit from 11/24/2021 in [place]  PHQ-2 Total Score 6 6 6 4 2   PHQ-9 Total Score 18 19 18 12 6       [place] Office Visit from 03/31/2022 in [place] Office Visit from 02/09/2022 in [place] Office Visit from 12/22/2021 in [place] No Risk No Risk No Risk        Assessment and Plan:  Villa Hollrah is a 67 y.o. year old male with a history of depression, anxiety, parkinsonism on levodopa, diabetes, hypertension, hyperlipidemia, carpal tunnel syndrome, s/p knee arthroscopy , who presents for follow up appointment for below.   1. MDD (major depressive disorder), recurrent episode, moderate 2. Anxiety state 4. Fatigue, unspecified type Acute stressors include: loss of his father with liver cancer Feb 2024 Other stressors include: unemployment   History: depression since 1979, ECT in 2023 with limited benefit   There  has been significant worsening in depressive symptoms with prominent fatigue since the  last visit.  Will obtain labs to rule out any medical health issues contributing to his symptoms.  Will switch from Abilify to Vraylar to see if it is more effective for his condition/depression.  Discussed potential metabolic side effect, QTc prolongation, EPS/worsening in parkinsonism.  Will continue bupropion, duloxetine to target depression.  Will continue clonazepam as needed for anxiety.  He was strongly advised to limit its use given his current somnolence.   3. Insomnia, unspecified type He has hypersomnia. Although he reportedly experiences snoring during deep sleep, his wife is not interested in pursuing a sleep evaluation, as she does not believe he has this condition.  He is advised to refrain from trazodone use if his drowsiness continues.   5. Cognitive deficits Functional Status   IADL: Independent in the following:driving           Requires assistance with the following:  managing finances, medications (using pill box, his wife checks) ADL  Independent in the following: bathing and hygiene, feeding, continence, grooming and toileting, walking          Requires assistance with the following: Folate, Vtamin B12, TSH Images Neuropsych assessment:  Etiology:   He denies any subjective symptoms of memory loss.  However, his IADL is somewhat limited. Will plan to do further evaluation at the next visit.  Will obtain labs to rule out medical health issues contributing to his condition.    # Alcohol use disorder in sustained remission Unchanged. He denies any alcohol use/craving for alcohol.  Will continue motivational interview. Will check Vitamin B1. Will hold supplement B1 at this time to avoid pill burden.   Plan  Continue duloxetine 90 mg daily  Continue bupropion 450 mg daily Continue Buspar 7.5 mg twice a day (restless leg from 10 mg BID) Decrease Abilify 15 mg daily for one week, then discontinue (EKG 426 msec, 08/2021) Start Vraylar 1.5 mg daily  Continue Trazodone 100  mg  at night as needed for sleep Continue clonazepam 0.5-1 mg twice a day as needed for anxiety Next appointment- 5/28 at 8:30 . In person Obtain labs (Cbc, cmp, tsh, ferritin, vitamin b12, folate, vitamin D, vitamin B1)   Past trials of medication: lexapro, duloxetine, venlafaxine, mirtazapine (rash), Abilify, quetiapine, clonazepam,    The patient demonstrates the following risk factors for suicide: Chronic risk factors for suicide include: psychiatric disorder of depression and chronic pain. Acute risk factors for suicide include: unemployment. Protective factors for this patient include: positive social support, coping skills and hope for the future. Considering these factors, the overall suicide risk at this point appears to be low. Patient is appropriate for outpatient follow up.    Collaboration of Care: Collaboration of Care: Other reviewed notes in Epic  Patient/Guardian was advised Release of Information must be obtained prior to any record release in order to collaborate their care with an outside provider. Patient/Guardian was advised if they have not already done so to contact the registration department to sign all necessary forms in order for [place] to release information regarding their care.   Consent: Patient/Guardian gives verbal consent for treatment and assignment of benefits for services provided during this visit. Patient/Guardian expressed understanding and agreed to proceed.    Norman Clay, MD 08/13/2022, 9:11 AM

## 2022-08-13 ENCOUNTER — Other Ambulatory Visit
Admission: RE | Admit: 2022-08-13 | Discharge: 2022-08-13 | Disposition: A | Discharge status: Home / Self Care | Payer: 59 | Source: Ambulatory Visit | Attending: Psychiatry | Admitting: Psychiatry

## 2022-08-13 ENCOUNTER — Ambulatory Visit: Payer: 59 | Admitting: Psychiatry

## 2022-08-13 ENCOUNTER — Encounter: Payer: Self-pay | Admitting: Psychiatry

## 2022-08-13 ENCOUNTER — Telehealth: Payer: Self-pay

## 2022-08-13 VITALS — BP 104/66 | HR 74 | Temp 97.7°F | Ht 68.5 in | Wt 186.4 lb

## 2022-08-13 DIAGNOSIS — F1021 Alcohol dependence, in remission: Secondary | ICD-10-CM

## 2022-08-13 DIAGNOSIS — G4700 Insomnia, unspecified: Secondary | ICD-10-CM

## 2022-08-13 DIAGNOSIS — R5383 Other fatigue: Secondary | ICD-10-CM | POA: Diagnosis present

## 2022-08-13 DIAGNOSIS — R4189 Other symptoms and signs involving cognitive functions and awareness: Secondary | ICD-10-CM | POA: Insufficient documentation

## 2022-08-13 DIAGNOSIS — F411 Generalized anxiety disorder: Secondary | ICD-10-CM | POA: Diagnosis not present

## 2022-08-13 DIAGNOSIS — F331 Major depressive disorder, recurrent, moderate: Secondary | ICD-10-CM | POA: Diagnosis present

## 2022-08-13 LAB — CBC
HCT: 41.1 % (ref 39.0–52.0)
Hemoglobin: 14.2 g/dL (ref 13.0–17.0)
MCH: 30.1 pg (ref 26.0–34.0)
MCHC: 34.5 g/dL (ref 30.0–36.0)
MCV: 87.3 fL (ref 80.0–100.0)
Platelets: 149 10*3/uL — ABNORMAL LOW (ref 150–400)
RBC: 4.71 MIL/uL (ref 4.22–5.81)
RDW: 13.2 % (ref 11.5–15.5)
WBC: 6.1 10*3/uL (ref 4.0–10.5)
nRBC: 0 % (ref 0.0–0.2)

## 2022-08-13 LAB — VITAMIN B12: Vitamin B-12: 438 pg/mL (ref 180–914)

## 2022-08-13 LAB — COMPREHENSIVE METABOLIC PANEL
ALT: 6 U/L (ref 0–44)
AST: 19 U/L (ref 15–41)
Albumin: 4.4 g/dL (ref 3.5–5.0)
Alkaline Phosphatase: 42 U/L (ref 38–126)
Anion gap: 9 (ref 5–15)
BUN: 13 mg/dL (ref 8–23)
CO2: 26 mmol/L (ref 22–32)
Calcium: 9.5 mg/dL (ref 8.9–10.3)
Chloride: 103 mmol/L (ref 98–111)
Creatinine, Ser: 0.88 mg/dL (ref 0.61–1.24)
GFR, Estimated: >60 mL/min (ref >60)
Glucose, Bld: 162 mg/dL — ABNORMAL HIGH (ref 70–99)
Potassium: 4.1 mmol/L (ref 3.5–5.1)
Sodium: 138 mmol/L (ref 135–145)
Total Bilirubin: 1.1 mg/dL (ref 0.3–1.2)
Total Protein: 7.2 g/dL (ref 6.5–8.1)

## 2022-08-13 LAB — FERRITIN: Ferritin: 119 ng/mL (ref 24–336)

## 2022-08-13 LAB — VITAMIN D 25 HYDROXY (VIT D DEFICIENCY, FRACTURES): Vit D, 25-Hydroxy: 13.68 ng/mL — ABNORMAL LOW (ref 30–100)

## 2022-08-13 LAB — FOLATE: Folate: 5.1 ng/mL — ABNORMAL LOW (ref >5.9)

## 2022-08-13 LAB — TSH: TSH: 2.715 u[IU]/mL (ref 0.350–4.500)

## 2022-08-13 MED ORDER — CARIPRAZINE HCL 1.5 MG PO CAPS: 1.5000 mg | ORAL_CAPSULE | Freq: Every day | ORAL | 1 refills | Status: AC

## 2022-08-13 NOTE — Patient Instructions (Signed)
Continue duloxetine 90 mg daily  Continue bupropion 450 mg daily Continue Buspar 7.5 mg twice a day  Decrease Abilify 15 mg daily for one week, then discontinue  Start Vraylar 1.5 mg daily  Continue Trazodone 100  mg at night as needed for sleep Continue clonazepam 0.5-1 mg twice a day as needed for anxiety Next appointment- 5/28 at 8:30 . In person Obtain labs (Cbc, cmp, tsh, ferritin, vitamin b12, folate, vitamin D)

## 2022-08-13 NOTE — Telephone Encounter (Signed)
Call pharmacist and he states that it needs a prior auth. he states he sent over a request.

## 2022-08-13 NOTE — Telephone Encounter (Signed)
Noted, pleas work on when you are in the office. Thanks.

## 2022-08-13 NOTE — Telephone Encounter (Signed)
Butch Penny called states that the vraylar is going to cost $1400. they can not use a saving card.

## 2022-08-14 NOTE — Progress Notes (Signed)
The lab results indicate that his levels of vitamin D and folic acid are low. Please advise him to contact his primary care provider for treatment. All other lab results are within acceptable ranges.

## 2022-08-14 NOTE — Progress Notes (Signed)
Called patient to discuss lab results no answer left voicemail for patient to return the call to the office

## 2022-08-17 NOTE — Progress Notes (Signed)
Tried calling patient several times no answer left voicemail for patient to return call to office

## 2022-08-18 ENCOUNTER — Telehealth: Payer: Self-pay | Admitting: Psychiatry

## 2022-08-18 NOTE — Telephone Encounter (Signed)
Patient friend, [name], calling to get an update on his medication that was being switched. Please call to discuss. States he was being switched to a different medication but does not know what medication it will be.

## 2022-08-18 NOTE — Telephone Encounter (Signed)
donna states that you need to send in the vrylar before he start to decrease the medication.

## 2022-08-18 NOTE — Telephone Encounter (Signed)
Please contact her or the patient. The following has been discussed at the last visit.  - Decrease Abilify 15 mg daily for one week, then discontinue - Start Vraylar 1.5 mg daily

## 2022-08-31 ENCOUNTER — Other Ambulatory Visit: Payer: Self-pay | Admitting: Psychiatry

## 2022-09-02 ENCOUNTER — Telehealth: Payer: Self-pay

## 2022-09-02 NOTE — Telephone Encounter (Signed)
pt was notified of his labwork and advised to speak with his pcp when he goes in a couple weeks.

## 2022-09-03 ENCOUNTER — Other Ambulatory Visit: Payer: Self-pay | Admitting: Psychiatry

## 2022-09-07 ENCOUNTER — Telehealth: Payer: Self-pay

## 2022-09-07 NOTE — Telephone Encounter (Signed)
I would recommend staying on Abilify for now, considering the limited options available. Please inform me if a refill is needed.

## 2022-09-07 NOTE — Telephone Encounter (Signed)
called states that he can not afford the vraylar. that they has spoken with Val and that they need to find out what dr. Vanetta Shawl wanted him to do. because they can not use saving card because of his insurance.   Pt next appt is 6-17  last seen on 4-4-

## 2022-09-08 ENCOUNTER — Other Ambulatory Visit: Payer: Self-pay | Admitting: Psychiatry

## 2022-09-08 ENCOUNTER — Telehealth: Payer: Self-pay

## 2022-09-08 MED ORDER — ARIPIPRAZOLE 30 MG PO TABS: 30.0000 mg | ORAL_TABLET | Freq: Every day | ORAL | 0 refills | Status: DC

## 2022-09-08 NOTE — Progress Notes (Unsigned)
BH MD/PA/NP OP Progress Note  09/10/2022 10:18 AM Ricky [name]  MRN:  [id number]  Chief Complaint:  Chief Complaint  Patient presents with   Follow-up   HPI:  This is a follow-up appointment for depression and anxiety.  He states that he feels the same.  He thinks about his deceased father, and he is worried about his children, although there is nothing going on.  He feels tired and depressed every day.  He tends to stay in the bed.  He denies SI.  He denies any hallucinations, paranoia.  He occasionally feels anxious without any triggers.  He took Klonopin on the way here as he was anxious.    Substance use  Tobacco Alcohol Other substances/  Current  denies Denies   Past  6-12 pack a day, last in 2019 denies  Past Treatment       Daily routine: sit in the chair Exercise: none Employment: unemployed since Dec 2021. Quit due to pain, used to work as a Lawyer since age 44 Support: Ricky [name]/fiance, older son, his parents/age 58's, who live next door Household: fiance Marital status: married (divorced twice) Number of children: 3 children, age 7-41 Education: 12 th grade He was born and grew up in [place]. He has good relationship with his parents as a child, although he suffers from depression and anxiety "since born."  Visit Diagnosis:    ICD-10-CM   1. MDD (major depressive disorder), recurrent episode, moderate (HCC)  F33.1     2. Anxiety state  F41.1     3. Fatigue, unspecified type  R53.83     4. Insomnia, unspecified type  G47.00       Past Psychiatric History: Please see initial evaluation for full details. I have reviewed the history. No updates at this time.     Past Medical History:  Past Medical History:  Diagnosis Date   Anxiety    Arthritis    Carpal tunnel syndrome    Depression    Diabetes (HCC)    Hyperlipidemia    Hypertension    Loss of teeth due to extraction    Parkinson's disease     Past Surgical History:  Procedure  Laterality Date   CARPAL TUNNEL RELEASE Right 11/28/2020   Procedure: CARPAL TUNNEL RELEASE ENDOSCOPIC;  Surgeon: Christena Flake, MD;  Location: [facility] ORS;  Service: Orthopedics;  Laterality: Right;   CARPAL TUNNEL RELEASE Left 01/22/2021   Procedure: CARPAL TUNNEL RELEASE ENDOSCOPIC;  Surgeon: Christena Flake, MD;  Location: [facility] ORS;  Service: Orthopedics;  Laterality: Left;   KNEE ARTHROSCOPY Right     Family Psychiatric History: Please see initial evaluation for full details. I have reviewed the history. No updates at this time.     Family History:  Family History  Problem Relation Age of Onset   Diabetes Mother    Hypertension Mother    CVA Father    Heart attack Father    Depression Sister    Depression Brother     Social History:  Social History   Socioeconomic History   Marital status: Single    Spouse name: Ricky [name]   Number of children: Not on file   Years of education: Not on file   Highest education level: Not on file  Occupational History   Not on file  Tobacco Use   Smoking status: Never   Smokeless tobacco: Never  Vaping Use   Vaping Use: Never used  Substance and Sexual Activity  Alcohol use: Never   Drug use: Never   Sexual activity: Not on file  Other Topics Concern   Not on file  Social History Narrative   Lives with friend   Social Determinants of Health   Financial Resource Strain: Not on file  Food Insecurity: Not on file  Transportation Needs: Not on file  Physical Activity: Not on file  Stress: Not on file  Social Connections: Not on file    Allergies:  Allergies  Allergen Reactions   Mirtazapine Rash    Metabolic Disorder Labs: Lab Results  Component Value Date   HGBA1C 5.2 08/25/2021   MPG 102.54 08/25/2021   No results found for: "PROLACTIN" No results found for: "CHOL", "TRIG", "HDL", "CHOLHDL", "VLDL", "LDLCALC" Lab Results  Component Value Date   TSH 2.715 08/13/2022   TSH 3.886 08/25/2021    Therapeutic Level  Labs: No results found for: "LITHIUM" No results found for: "VALPROATE" No results found for: "CBMZ"  Current Medications: Current Outpatient Medications  Medication Sig Dispense Refill   ARIPiprazole (ABILIFY) 30 MG tablet Take 1 tablet (30 mg total) by mouth daily. 30 tablet 0   atenolol (TENORMIN) 50 MG tablet Take 50 mg by mouth daily.     buPROPion (WELLBUTRIN XL) 150 MG 24 hr tablet Take 1 tablet (150 mg total) by mouth daily. Total of 450 mg daily. Take along with 300 mg tab 30 tablet 5   buPROPion (WELLBUTRIN XL) 300 MG 24 hr tablet Take 1 tablet (300 mg total) by mouth daily. Total of 450 mg daily. Take along with 150 mg tab 30 tablet 5   busPIRone (BUSPAR) 7.5 MG tablet Take 1 tablet (7.5 mg total) by mouth 2 (two) times daily. 60 tablet 1   carbidopa-levodopa (SINEMET IR) 25-100 MG tablet Take 1 tablet by mouth 5 (five) times daily.     cariprazine (VRAYLAR) 1.5 MG capsule Take 1 capsule (1.5 mg total) by mouth daily. 30 capsule 1   clonazePAM (KLONOPIN) 1 MG tablet TAKE 1/2-1 TABLET BY MOUTH TWICE DAILY AS NEEDED FOR ANXIETY 60 tablet 1   enalapril (VASOTEC) 10 MG tablet Take 10 mg by mouth daily.     ketoconazole (NIZORAL) 2 % shampoo Apply 1 application topically every other day.     metFORMIN (GLUCOPHAGE) 500 MG tablet Take 500 mg by mouth 2 (two) times daily with a meal.     rosuvastatin (CRESTOR) 20 MG tablet Take 20 mg by mouth daily.     sertraline (ZOLOFT) 25 MG tablet Take 1 tablet (25 mg total) by mouth at bedtime for 7 days. 7 tablet 0   [START ON 09/17/2022] sertraline (ZOLOFT) 50 MG tablet Take 1 tablet (50 mg total) by mouth at bedtime. Start after completing 25 mg at night for one week 30 tablet 1   sildenafil (VIAGRA) 50 MG tablet Take 50 mg by mouth daily as needed for erectile dysfunction.     traZODone (DESYREL) 100 MG tablet Take 1 tablet (100 mg total) by mouth at bedtime as needed. for sleep 30 tablet 3   VICTOZA 18 MG/3ML SOPN Inject 1.2 mg into the skin  daily.     No current facility-administered medications for this visit.     Musculoskeletal: Strength & Muscle Tone: within normal limits Gait & Station: normal Patient leans: N/A  Psychiatric Specialty Exam: Review of Systems  Psychiatric/Behavioral:  Positive for dysphoric mood and sleep disturbance. Negative for agitation, behavioral problems, confusion, decreased concentration, hallucinations, self-injury and suicidal ideas. The  patient is nervous/anxious. The patient is not hyperactive.   All other systems reviewed and are negative.   Blood pressure 109/70, pulse 72, temperature (!) 97.3 F (36.3 C), temperature source Skin, height 5' 8.5" (1.74 m), weight 185 lb 6.4 oz (84.1 kg).Body mass index is 27.78 kg/m.  General Appearance: Fairly Groomed  Eye Contact:  Good  Speech:  Clear and Coherent  Volume:  Normal  Mood:  Depressed  Affect:  Appropriate, Congruent, and masked face  Thought Process:  Coherent  Orientation:  Full (Time, Place, and Person)  Thought Content: Logical   Suicidal Thoughts:  No  Homicidal Thoughts:  No  Memory:  Immediate;   Good  Judgement:  Good  Insight:  Good  Psychomotor Activity:   resting tremor on bilateral arm, no rigidity, normal tonus, no TD  Concentration:  Concentration: Good and Attention Span: Good  Recall:  Good  Fund of Knowledge: Good  Language: Good  Akathisia:  No  Handed:  Right  AIMS (if indicated): not done  Assets:  Communication Skills Desire for Improvement  ADL's:  Intact  Cognition: WNL  Sleep:   hypersomnia   Screenings: ECT-MADRS    Flowsheet Row ECT Treatment from 07/28/2021 in [facility]  MADRS Total Score 41      GAD-7    Flowsheet Row Office Visit from 06/16/2022 in [facility] Office Visit from 03/31/2022 in [facility] Office Visit from 02/09/2022 in [facility] Office Visit from 12/22/2021 in [facility]  Total GAD-7 Score 20 21 12 8       Mini-Mental    Flowsheet Row ECT Treatment from 07/28/2021 in [facility]  Total Score (max 30 points ) 30      PHQ2-9    Flowsheet Row Office Visit from 06/16/2022 in [facility] Office Visit from 03/31/2022 in [facility] Office Visit from 02/09/2022 in [facility] Office Visit from 12/22/2021 in [facility] Office Visit from 11/24/2021 in [facility]  PHQ-2 Total Score 6 6 6 4 2   PHQ-9 Total Score 18 19 18 12 6       Flowsheet Row Office Visit from 03/31/2022 in [facility] Office Visit from 02/09/2022 in [facility] Office Visit from 12/22/2021 in [facility]  C-SSRS RISK CATEGORY No Risk No Risk No Risk        Assessment and Plan:  Ricky [name] is a 67 y.o. year old male with a history of depression, anxiety, parkinsonism on levodopa, diabetes, hypertension, hyperlipidemia, carpal tunnel syndrome, s/p knee arthroscopy , who presents for follow up appointment for below.   1. MDD (major depressive disorder), recurrent episode, moderate (HCC) 2. Anxiety state Acute stressors include: loss of his father with liver cancer Feb 2024 Other stressors include: unemployment   History: depression since 1979, ECT in 2023 with limited benefit, originally on duloxetine 60 mg daily, bupropion 150 mg daily Abilify 30 mg daily   Despite his anxiety becoming more manageable, he continues to experience depressive symptoms with prominent fatigue since the last visit.  He could not afford to [employer].  Will switch  from duloxetine to sertraline to see if it is more effective in his condition.  Discussed  potential risk of serotonin syndrome, and GI side effect, drowsiness from sertraline. Will continue Abilify at this time as adjunctive treatment for depression.  Will continue bupropion for depression.  Will continue clonazepam as needed for anxiety at this time.   3. Fatigue, unspecified type This is likely multifactorial, influenced by both Parkinson's disease and depression. May consider methylphenidate in the future if he has limited benefit from the above intervention.   4. Insomnia, unspecified type - He experiences hypersomnia, with reports of snoring during deep sleep. However, his wife is reluctant to pursue a sleep evaluation, as she doubts he has this condition.   Unchanged.  Will continue trazodone as needed for insomnia.  He was advised to refrain use of clonazepam, trazodone if he experiences drowsiness.     5. Cognitive deficits Functional Status   IADL: Independent in the following:driving           Requires assistance with the following:  managing finances, medications (using pill box, his wife checks) ADL  Independent in the following: bathing and hygiene, feeding, continence, grooming and toileting, walking          Requires assistance with the following: Folate, Vitamin B12, TSH- low folic acid 08/2022. Otherwise wnl Images Neuropsych assessment:  Etiology: parkinson, r/o VaD   He denies any subjective symptoms of memory loss.  However, his IADL is somewhat limited. Will plan to do further evaluation in the future visit.    # Alcohol use disorder in sustained remission Unchanged. He denies any alcohol use/craving for alcohol.  Will continue motivational interview.  Although the plan was to obtain vitamin B1, it was not checked at the recent blood test.  Will discuss this at his next visit.   # vitamin D deficiency # low folic acid He has an upcoming appointment with PCP.  He agrees  to discuss the above with the provider and pursue treatment.   Plan  Reduce duloxetine by 30 mg per week until discontinuation. Start sertraline 25 mg at night for one week, then 50 mg at night  Continue bupropion 450 mg daily Continue Buspar 7.5 mg twice a day (restless leg from 10 mg BID) Continue Abilify 30 mg daily (EKG 426 msec, 08/2021) Continue Trazodone 100  mg at night as needed for sleep Continue clonazepam 0.5-1 mg twice a day as needed for anxiety Next appointment- 7/9 at 8:30 . In person Obtain labs (Cbc, cmp, tsh, ferritin, vitamin b12, folate, vitamin D, vitamin B1)   Past trials of medication: lexapro, duloxetine, venlafaxine, mirtazapine (rash), Abilify, quetiapine, clonazepam,    The patient demonstrates the following risk factors for suicide: Chronic risk factors for suicide include: psychiatric disorder of depression and chronic pain. Acute risk factors for suicide include: unemployment. Protective factors for this patient include: positive social support, coping skills and hope for the future. Considering these factors, the overall suicide risk at this point appears to be low. Patient is appropriate for outpatient follow up.    Collaboration of Care: Collaboration of Care: Other reviewed notes in Epic  Patient/Guardian was advised Release of Information must be obtained prior to any record release in order to collaborate their care with an outside provider. Patient/Guardian was advised if they have not already done so to contact the registration department to sign all necessary forms in order for [place] to release information regarding their care.   Consent: Patient/Guardian gives verbal consent for treatment and assignment of benefits for services provided during this visit. Patient/Guardian expressed understanding and agreed  to proceed.    Neysa Hotter, MD 09/10/2022, 10:18 AM

## 2022-09-08 NOTE — Telephone Encounter (Signed)
Ordered

## 2022-09-08 NOTE — Telephone Encounter (Signed)
No answer left voicemail for patient to return  call to the office

## 2022-09-08 NOTE — Telephone Encounter (Signed)
Patient needs a refill for the following medication   ARIPiprazole (ABILIFY) 30 MG tablet   Pharmacy [facility] - [place], [place] - [address] Phone: [phone]  Fax: [phone]

## 2022-09-10 ENCOUNTER — Ambulatory Visit: Payer: 59 | Admitting: Psychiatry

## 2022-09-10 ENCOUNTER — Encounter: Payer: Self-pay | Admitting: Psychiatry

## 2022-09-10 VITALS — BP 109/70 | HR 72 | Temp 97.3°F | Ht 68.5 in | Wt 185.4 lb

## 2022-09-10 DIAGNOSIS — G4700 Insomnia, unspecified: Secondary | ICD-10-CM | POA: Diagnosis not present

## 2022-09-10 DIAGNOSIS — F411 Generalized anxiety disorder: Secondary | ICD-10-CM | POA: Diagnosis not present

## 2022-09-10 DIAGNOSIS — F331 Major depressive disorder, recurrent, moderate: Secondary | ICD-10-CM

## 2022-09-10 DIAGNOSIS — R5383 Other fatigue: Secondary | ICD-10-CM

## 2022-09-10 MED ORDER — SERTRALINE HCL 50 MG PO TABS: 50.0000 mg | ORAL_TABLET | Freq: Every day | ORAL | 1 refills | Status: DC

## 2022-09-10 MED ORDER — SERTRALINE HCL 25 MG PO TABS: 25.0000 mg | ORAL_TABLET | Freq: Every day | ORAL | 0 refills | Status: DC

## 2022-09-10 NOTE — Patient Instructions (Signed)
Reduce duloxetine by 30 mg per week until discontinuation. Start sertraline 25 mg at night for one week, then 50 mg at night  Continue bupropion 450 mg daily Continue Buspar 7.5 mg twice a day  Continue Abilify 30 mg daily  Continue Trazodone 100  mg at night as needed for sleep Continue clonazepam 0.5-1 mg twice a day as needed for anxiety Next appointment- 7/9 at 8:30

## 2022-09-14 ENCOUNTER — Encounter (HOSPITAL_COMMUNITY): Payer: Self-pay

## 2022-09-21 ENCOUNTER — Telehealth: Payer: Self-pay

## 2022-09-21 NOTE — Telephone Encounter (Signed)
Ricky [name] left a message that Ricky [name] is sleeping alot and sleeping late. needs to speak with you about doing something else.

## 2022-09-22 NOTE — Telephone Encounter (Signed)
Talked with Ricky [name].  He has insomnia after starting sertraline, and tends to sleep during the day. She agreed to try the following.  - take sertraline 50 mg in AM

## 2022-10-05 NOTE — Progress Notes (Signed)
BH MD/PA/NP OP Progress Note  10/12/2022 5:20 PM Ricky [name]  MRN:  [id number]  Chief Complaint:  Chief Complaint  Patient presents with   Follow-up   HPI:  This is a follow-up appointment for depression, anxiety and insomnia.  He states that he is not as anxious compared to before.  He has been able to do more things for the past few days.  He mows yard.  He hopes to be able to be active moving forward.  He denies feeling dizzy in the morning since he takes saturating in the morning. The patient has mood symptoms as in PHQ-9/GAD-7.  He sleeps better.  He has good appetite.  He denies SI.  He has been taking clonazepam up to twice a day due to his anxiety.  He is willing to try higher dose of clonazepam at this time.   [name], his wife presents to the visit.  She agrees that he has been doing better.  She notices that his tremor improves when he is more active.  She agrees to discuss with her provider regarding available resources (such as boxing for people with parkinson) for him to stay active.  She is hoping that he will be able to interact with his family without much anxiety.  He had severe anxiety several days ago when he was at home.    Wt Readings from Last 3 Encounters:  10/12/22 184 lb 9.6 oz (83.7 kg)  09/10/22 185 lb 6.4 oz (84.1 kg)  08/13/22 186 lb 6.4 oz (84.6 kg)     Substance use   Tobacco Alcohol Other substances/  Current   denies Denies   Past   6-12 pack a day, last in 2019 denies  Past Treatment          Daily routine: sit in the chair Exercise: none Employment: unemployed since Dec 2021. Quit due to pain, used to work as a Lawyer since age 24 Support: [name]/fiance, older son, his parents/age 5's, who live next door Household: fiance Marital status: married (divorced twice) Number of children: 3 children, age 69-41 Education: 12 th grade He was born and grew up in [place]. He has good relationship with his parents as a child,  although he suffers from depression and anxiety "since born."  Visit Diagnosis:    ICD-10-CM   1. MDD (major depressive disorder), recurrent episode, moderate (HCC)  F33.1     2. Anxiety state  F41.1     3. Fatigue, unspecified type  R53.83       Past Psychiatric History: Please see initial evaluation for full details. I have reviewed the history. No updates at this time.     Past Medical History:  Past Medical History:  Diagnosis Date   Anxiety    Arthritis    Carpal tunnel syndrome    Depression    Diabetes (HCC)    Hyperlipidemia    Hypertension    Loss of teeth due to extraction    Parkinson's disease     Past Surgical History:  Procedure Laterality Date   CARPAL TUNNEL RELEASE Right 11/28/2020   Procedure: CARPAL TUNNEL RELEASE ENDOSCOPIC;  Surgeon: Christena Flake, MD;  Location: [facility] ORS;  Service: Orthopedics;  Laterality: Right;   CARPAL TUNNEL RELEASE Left 01/22/2021   Procedure: CARPAL TUNNEL RELEASE ENDOSCOPIC;  Surgeon: Christena Flake, MD;  Location: [facility] OR[facility];  Service: Orthopedics;  Laterality: Left;   KNEE ARTHROSCOPY Right     Family Psychiatric History: Please  see initial evaluation for full details. I have reviewed the history. No updates at this time.     Family History:  Family History  Problem Relation Age of Onset   Diabetes Mother    Hypertension Mother    CVA Father    Heart attack Father    Depression Sister    Depression Brother     Social History:  Social History   Socioeconomic History   Marital status: Single    Spouse name: [name]   Number of children: Not on file   Years of education: Not on file   Highest education level: Not on file  Occupational History   Not on file  Tobacco Use   Smoking status: Never   Smokeless tobacco: Never  Vaping Use   Vaping Use: Never used  Substance and Sexual Activity   Alcohol use: Never   Drug use: Never   Sexual activity: Not on file  Other Topics Concern   Not on file  Social History  Narrative   Lives with friend   Social Determinants of Health   Financial Resource Strain: Not on file  Food Insecurity: Not on file  Transportation Needs: Not on file  Physical Activity: Not on file  Stress: Not on file  Social Connections: Not on file    Allergies:  Allergies  Allergen Reactions   Mirtazapine Rash    Metabolic Disorder Labs: Lab Results  Component Value Date   HGBA1C 5.2 08/25/2021   MPG 102.54 08/25/2021   No results found for: "PROLACTIN" No results found for: "CHOL", "TRIG", "HDL", "CHOLHDL", "VLDL", "LDLCALC" Lab Results  Component Value Date   TSH 2.715 08/13/2022   TSH 3.886 08/25/2021    Therapeutic Level Labs: No results found for: "LITHIUM" No results found for: "VALPROATE" No results found for: "CBMZ"  Current Medications: Current Outpatient Medications  Medication Sig Dispense Refill   atenolol (TENORMIN) 50 MG tablet Take 50 mg by mouth daily.     buPROPion (WELLBUTRIN XL) 150 MG 24 hr tablet Take 1 tablet (150 mg total) by mouth daily. Total of 450 mg daily. Take along with 300 mg tab 30 tablet 5   buPROPion (WELLBUTRIN XL) 300 MG 24 hr tablet Take 1 tablet (300 mg total) by mouth daily. Total of 450 mg daily. Take along with 150 mg tab 30 tablet 5   busPIRone (BUSPAR) 7.5 MG tablet Take 1 tablet (7.5 mg total) by mouth 2 (two) times daily. 60 tablet 1   carbidopa-levodopa (SINEMET IR) 25-100 MG tablet Take 1 tablet by mouth 5 (five) times daily.     cariprazine (VRAYLAR) 1.5 MG capsule Take 1 capsule (1.5 mg total) by mouth daily. 30 capsule 1   clonazePAM (KLONOPIN) 1 MG tablet TAKE 1/2-1 TABLET BY MOUTH TWICE DAILY AS NEEDED FOR ANXIETY 60 tablet 1   enalapril (VASOTEC) 10 MG tablet Take 10 mg by mouth daily.     ketoconazole (NIZORAL) 2 % shampoo Apply 1 application topically every other day.     metFORMIN (GLUCOPHAGE) 500 MG tablet Take 500 mg by mouth 2 (two) times daily with a meal.     rosuvastatin (CRESTOR) 20 MG tablet Take  20 mg by mouth daily.     sertraline (ZOLOFT) 100 MG tablet Take 1 tablet (100 mg total) by mouth daily. 30 tablet 1   sertraline (ZOLOFT) 50 MG tablet Take 1 tablet (50 mg total) by mouth at bedtime. Start after completing 25 mg at night for one week 30  tablet 1   sildenafil (VIAGRA) 50 MG tablet Take 50 mg by mouth daily as needed for erectile dysfunction.     traZODone (DESYREL) 100 MG tablet Take 1 tablet (100 mg total) by mouth at bedtime as needed. for sleep 30 tablet 3   VICTOZA 18 MG/3ML SOPN Inject 1.2 mg into the skin daily.     ARIPiprazole (ABILIFY) 30 MG tablet Take 1 tablet (30 mg total) by mouth daily. 30 tablet 0   No current facility-administered medications for this visit.     Musculoskeletal: Strength & Muscle Tone: within normal limits Gait & Station: normal Patient leans: N/A  Psychiatric Specialty Exam: Review of Systems  Psychiatric/Behavioral:  Positive for decreased concentration and dysphoric mood. Negative for agitation, behavioral problems, confusion, hallucinations, self-injury, sleep disturbance and suicidal ideas. The patient is nervous/anxious. The patient is not hyperactive.   All other systems reviewed and are negative.   Blood pressure 114/74, pulse 69, temperature (!) 97.3 F (36.3 C), temperature source Skin, height 5' 8.5" (1.74 m), weight 184 lb 9.6 oz (83.7 kg).Body mass index is 27.66 kg/m.  General Appearance: Fairly Groomed  Eye Contact:  Good  Speech:  Clear and Coherent  Volume:  Normal  Mood:   better  Affect:  Appropriate, Congruent, and brighter  Thought Process:  Coherent  Orientation:  Full (Time, Place, and Person)  Thought Content: Logical   Suicidal Thoughts:  No  Homicidal Thoughts:  No  Memory:  Immediate;   Good  Judgement:  Good  Insight:  Good  Psychomotor Activity:   resting tremors in bilateral arm  Concentration:  Concentration: Good and Attention Span: Good  Recall:  Good  Fund of Knowledge: Good  Language: Good   Akathisia:  No  Handed:  Right  AIMS (if indicated): not done  Assets:  Communication Skills Desire for Improvement  ADL's:  Intact  Cognition: WNL  Sleep:  Good   Screenings: ECT-MADRS    Flowsheet Row ECT Treatment from 07/28/2021 in [facility]  MADRS Total Score 41      GAD-7    Flowsheet Row Office Visit from 10/12/2022 in [facility] Office Visit from 06/16/2022 in [facility] Office Visit from 03/31/2022 in [facility] Office Visit from 02/09/2022 in [facility] Office Visit from 12/22/2021 in [facility]  Total GAD-7 Score 12 20 21 12 8       Mini-Mental    Flowsheet Row ECT Treatment from 07/28/2021 in [facility]  Total Score (max 30 points ) 30      PHQ2-9    Flowsheet Row Office Visit from 10/12/2022 in [facility] Office Visit from 06/16/2022 in [facility] Office Visit from 03/31/2022 in [facility] Office Visit from 02/09/2022 in [facility] Office Visit from 12/22/2021 in [facility]  PHQ-2 Total Score 4 6 6 6 4   PHQ-9 Total Score 11 18 19 18 12       Flowsheet Row Office Visit from 03/31/2022 in [facility] Office Visit from 02/09/2022 in [facility] Office Visit from 12/22/2021 in [facility]  C-SSRS RISK CATEGORY No Risk No Risk No Risk  Assessment and Plan:  Ebraheem Heman is a 67 y.o. year old male with a history of depression, anxiety, parkinsonism on levodopa, diabetes,  hypertension, hyperlipidemia, carpal tunnel syndrome, s/p knee arthroscopy , who presents for follow up appointment for below.   1. MDD (major depressive disorder), recurrent episode, moderate (HCC) 2. Anxiety state Acute stressors include: loss of his father with liver cancer Feb 2024 Other stressors include: unemployment   History: depression since 1979, ECT in 2023 with limited benefit, originally on duloxetine 60 mg daily, bupropion 150 mg daily Abilify 30 mg daily   Exam is notable for his smile during the visit, and there has been overall improvement in depressive symptoms and anxiety since switching from duloxetine to sertraline.  Will do further uptitration of sertraline to optimize treatment for depression and anxiety.  Will continue bupropion and Abilify at this time as adjunctive treatment for depression.  Will continue BuSpar for anxiety, and clonazepam as needed for anxiety.  Will consider tapering him off either of these medication in the future if he has continued improvement in his mood symptoms.   3. Fatigue, unspecified type   This is likely multifactorial, influenced by both Parkinson's disease and depression. May consider methylphenidate in the future if he has limited benefit from the above intervention.    4. Insomnia, unspecified type - He experiences hypersomnia, with reports of snoring during deep sleep. However, his wife is reluctant to pursue a sleep evaluation, as she doubts he has this condition.   Improving.  Will continue trazodone as needed for insomnia. He was advised to refrain use of clonazepam, trazodone if he experiences drowsiness.     5. Cognitive deficits Functional Status   IADL: Independent in the following:driving           Requires assistance with the following:  managing finances, medications (using pill box, his wife checks) ADL  Independent in the following: bathing and hygiene, feeding, continence, grooming and toileting, walking          Requires  assistance with the following: Folate, Vitamin B12, TSH- low folic acid 08/2022. Otherwise wnl Images Neuropsych assessment:  Etiology: parkinson, r/o VaD   He denies any subjective symptoms of memory loss.  However, his IADL is somewhat limited. Will plan to do further evaluation in the future visit.    # Alcohol use disorder in sustained remission Unchanged. He denies any alcohol use/craving for alcohol.  Will continue motivational interview.  Although the plan was to obtain vitamin B1, it was not checked at the recent blood test.  Will discuss this at his next visit.    # vitamin D deficiency # low folic acid Unclear intervention. Will discuss with him at the next visit.    Plan  Increase sertraline 100 mg daily  Continue bupropion 450 mg daily Continue Buspar 7.5 mg twice a day (restless leg from 10 mg BID) Continue Abilify 30 mg daily (EKG 426 msec, 08/2021) Continue Trazodone 100  mg at night as needed for sleep Continue clonazepam 0.5-1 mg twice a day as needed for anxiety- a refill left Next appointment- 7/9 and 8/5 at 11 am for 30 mins, IP - he will recheck TSH by his PCP  Past trials of medication: lexapro, duloxetine, venlafaxine, mirtazapine (rash), Abilify, quetiapine, clonazepam,    The patient demonstrates the following risk factors for suicide: Chronic risk factors for suicide include: psychiatric disorder of depression and chronic pain. Acute risk factors for suicide include: unemployment. Protective factors for this patient include:  positive social support, coping skills and hope for the future. Considering these factors, the overall suicide risk at this point appears to be low. Patient is appropriate for outpatient follow up.    Collaboration of Care: Collaboration of Care: Other reviewed notes in Epic  Patient/Guardian was advised Release of Information must be obtained prior to any record release in order to collaborate their care with an outside provider.  Patient/Guardian was advised if they have not already done so to contact the registration department to sign all necessary forms in order for [place] to release information regarding their care.   Consent: Patient/Guardian gives verbal consent for treatment and assignment of benefits for services provided during this visit. Patient/Guardian expressed understanding and agreed to proceed.    Neysa Hotter, MD 10/12/2022, 5:20 PM

## 2022-10-06 ENCOUNTER — Ambulatory Visit: Payer: 59 | Admitting: Psychiatry

## 2022-10-12 ENCOUNTER — Ambulatory Visit: Payer: 59 | Admitting: Psychiatry

## 2022-10-12 ENCOUNTER — Encounter: Payer: Self-pay | Admitting: Psychiatry

## 2022-10-12 VITALS — BP 114/74 | HR 69 | Temp 97.3°F | Ht 68.5 in | Wt 184.6 lb

## 2022-10-12 DIAGNOSIS — F411 Generalized anxiety disorder: Secondary | ICD-10-CM

## 2022-10-12 DIAGNOSIS — F331 Major depressive disorder, recurrent, moderate: Secondary | ICD-10-CM

## 2022-10-12 DIAGNOSIS — R5383 Other fatigue: Secondary | ICD-10-CM

## 2022-10-12 MED ORDER — ARIPIPRAZOLE 30 MG PO TABS: 30.0000 mg | ORAL_TABLET | Freq: Every day | ORAL | 0 refills | Status: DC

## 2022-10-12 MED ORDER — SERTRALINE HCL 100 MG PO TABS: 100.0000 mg | ORAL_TABLET | Freq: Every day | ORAL | 1 refills | Status: DC

## 2022-10-26 ENCOUNTER — Ambulatory Visit: Payer: 59 | Admitting: Psychiatry

## 2022-10-26 ENCOUNTER — Other Ambulatory Visit: Payer: Self-pay | Admitting: Psychiatry

## 2022-10-29 ENCOUNTER — Ambulatory Visit: Payer: 59 | Admitting: Psychiatry

## 2022-11-02 NOTE — Telephone Encounter (Signed)
darcie from [facility] called left message that they are waiting a refill for the buspar 7.5mg    Pt was last seen on 6-3 next appt 7-9

## 2022-11-11 NOTE — Progress Notes (Signed)
BH MD/PA/NP OP Progress Note  11/17/2022 9:05 AM Ricky [name]  MRN:  [id number]  Chief Complaint:  Chief Complaint  Patient presents with   Follow-up   HPI:  - According to the chart review, the following events have occurred since the last visit: The patient was seen by neurology. Sinemt was uptitrated. Synuclein biopsy was considered.   This is a follow-up appointment for depression, anxiety and insomnia.  He states this sertraline is helping for him to sleep better.  However, he has no motivation to do anything.  He enjoyed going to a party for his son's first child. They are expecting in August 12th.  He did not need to take clonazepam, although he was anxious.  He thinks he has been feeling more anxious when he is inside instead of going outside.  He agrees to try regular activities during the day to motivate himself.  He sleeps 10 hours, and still feel tired.  He has good appetite.  He denies SI.  He denies hallucinations.   [name], his significant other presents to the visit.  She states that he did a very good when they went to a party.  There were so many people at the party.  She agrees to try having him doing regular activities which is of interest to him.  He has been able to take medication regularly.  She denies any other concern at this time.    Wt Readings from Last 3 Encounters:  10/12/22 184 lb 9.6 oz (83.7 kg)  09/10/22 185 lb 6.4 oz (84.1 kg)  08/13/22 186 lb 6.4 oz (84.6 kg)      Substance use   Tobacco Alcohol Other substances/  Current   denies Denies   Past   6-12 pack a day, last in 2019 denies  Past Treatment          Daily routine: sit in the chair Exercise: none Employment: unemployed since Dec 2021. Quit due to pain, used to work as a Lawyer since age 70 Support: [name]/fiance, older son, his parents/age 48's, who live next door Household: fiance Marital status: married (divorced twice) Number of children: 3 children, age  85-41 Education: 12 th grade He was born and grew up in [place]. He has good relationship with his parents as a child, although he suffers from depression and anxiety "since born."  Visit Diagnosis:    ICD-10-CM   1. MDD (major depressive disorder), recurrent episode, moderate (HCC)  F33.1     2. Anxiety state  F41.1     3. Insomnia, unspecified type  G47.00       Past Psychiatric History: Please see initial evaluation for full details. I have reviewed the history. No updates at this time.     Past Medical History:  Past Medical History:  Diagnosis Date   Anxiety    Arthritis    Carpal tunnel syndrome    Depression    Diabetes (HCC)    Hyperlipidemia    Hypertension    Loss of teeth due to extraction    Parkinson's disease     Past Surgical History:  Procedure Laterality Date   CARPAL TUNNEL RELEASE Right 11/28/2020   Procedure: CARPAL TUNNEL RELEASE ENDOSCOPIC;  Surgeon: Christena Flake, MD;  Location: [facility] ORS;  Service: Orthopedics;  Laterality: Right;   CARPAL TUNNEL RELEASE Left 01/22/2021   Procedure: CARPAL TUNNEL RELEASE ENDOSCOPIC;  Surgeon: Christena Flake, MD;  Location: [facility] OR[facility];  Service: Orthopedics;  Laterality:  Left;   KNEE ARTHROSCOPY Right     Family Psychiatric History: Please see initial evaluation for full details. I have reviewed the history. No updates at this time.     Family History:  Family History  Problem Relation Age of Onset   Diabetes Mother    Hypertension Mother    CVA Father    Heart attack Father    Depression Sister    Depression Brother     Social History:  Social History   Socioeconomic History   Marital status: Single    Spouse name: [name]   Number of children: Not on file   Years of education: Not on file   Highest education level: Not on file  Occupational History   Not on file  Tobacco Use   Smoking status: Never   Smokeless tobacco: Never  Vaping Use   Vaping Use: Never used  Substance and Sexual  Activity   Alcohol use: Never   Drug use: Never   Sexual activity: Not on file  Other Topics Concern   Not on file  Social History Narrative   Lives with friend   Social Determinants of Health   Financial Resource Strain: Not on file  Food Insecurity: Not on file  Transportation Needs: Not on file  Physical Activity: Not on file  Stress: Not on file  Social Connections: Not on file    Allergies:  Allergies  Allergen Reactions   Mirtazapine Rash    Metabolic Disorder Labs: Lab Results  Component Value Date   HGBA1C 5.2 08/25/2021   MPG 102.54 08/25/2021   No results found for: "PROLACTIN" No results found for: "CHOL", "TRIG", "HDL", "CHOLHDL", "VLDL", "LDLCALC" Lab Results  Component Value Date   TSH 2.715 08/13/2022   TSH 3.886 08/25/2021    Therapeutic Level Labs: No results found for: "LITHIUM" No results found for: "VALPROATE" No results found for: "CBMZ"  Current Medications: Current Outpatient Medications  Medication Sig Dispense Refill   atenolol (TENORMIN) 50 MG tablet Take 50 mg by mouth daily.     busPIRone (BUSPAR) 7.5 MG tablet Take 1 tablet (7.5 mg total) by mouth 2 (two) times daily. 60 tablet 1   carbidopa-levodopa (SINEMET IR) 25-100 MG tablet Take 1 tablet by mouth 5 (five) times daily.     enalapril (VASOTEC) 10 MG tablet Take 10 mg by mouth daily.     ketoconazole (NIZORAL) 2 % shampoo Apply 1 application topically every other day.     metFORMIN (GLUCOPHAGE) 500 MG tablet Take 500 mg by mouth 2 (two) times daily with a meal.     rosuvastatin (CRESTOR) 20 MG tablet Take 20 mg by mouth daily.     sildenafil (VIAGRA) 50 MG tablet Take 50 mg by mouth daily as needed for erectile dysfunction.     traZODone (DESYREL) 100 MG tablet Take 1 tablet (100 mg total) by mouth at bedtime as needed. for sleep 30 tablet 3   VICTOZA 18 MG/3ML SOPN Inject 1.2 mg into the skin daily.     ARIPiprazole (ABILIFY) 30 MG tablet Take 1 tablet (30 mg total) by mouth  daily. 30 tablet 1   [START ON 11/28/2022] buPROPion (WELLBUTRIN XL) 150 MG 24 hr tablet Take 1 tablet (150 mg total) by mouth daily. Total of 450 mg daily. Take along with 300 mg tab 30 tablet 5   [START ON 11/28/2022] buPROPion (WELLBUTRIN XL) 300 MG 24 hr tablet Take 1 tablet (300 mg total) by mouth daily. Total of 450  mg daily. Take along with 150 mg tab 30 tablet 5   clonazePAM (KLONOPIN) 1 MG tablet TAKE 1/2-1 TABLET BY MOUTH TWICE DAILY AS NEEDED FOR ANXIETY 60 tablet 1   [START ON 12/11/2022] sertraline (ZOLOFT) 100 MG tablet Take 1 tablet (100 mg total) by mouth daily. 30 tablet 0   No current facility-administered medications for this visit.     Musculoskeletal: Strength & Muscle Tone: within normal limits Gait & Station: shuffle Patient leans: N/A  Psychiatric Specialty Exam: Review of Systems  Psychiatric/Behavioral:  Positive for dysphoric mood. Negative for agitation, behavioral problems, confusion, decreased concentration, hallucinations, self-injury, sleep disturbance and suicidal ideas. The patient is nervous/anxious. The patient is not hyperactive.   All other systems reviewed and are negative.   Blood pressure 138/80, pulse 70, temperature 98.3 F (36.8 C), temperature source Temporal, height 5' 8.5" (1.74 m), weight 185 lb (83.9 kg), SpO2 94 %.Body mass index is 27.72 kg/m.  General Appearance: Fairly Groomed  Eye Contact:  Good  Speech:  Clear and Coherent  Volume:  Normal  Mood:   good today  Affect:  Appropriate, Congruent, and Restricted  Thought Process:  Coherent  Orientation:  Full (Time, Place, and Person)  Thought Content: Logical   Suicidal Thoughts:  No  Homicidal Thoughts:  No  Memory:  Immediate;   Good  Judgement:  Good  Insight:  Good  Psychomotor Activity:   mild cogwheel rigidity in right arm  Concentration:  Concentration: Good and Attention Span: Good  Recall:  Good  Fund of Knowledge: Good  Language: Good  Akathisia:  No  Handed:  Right   AIMS (if indicated): not done  Assets:  Communication Skills Desire for Improvement  ADL's:  Intact  Cognition: WNL  Sleep:   hypersomnia   Screenings: ECT-MADRS    Flowsheet Row ECT Treatment from 07/28/2021 in [facility]  MADRS Total Score 41      GAD-7    Flowsheet Row Office Visit from 10/12/2022 in [facility] Office Visit from 06/16/2022 in [facility] Office Visit from 03/31/2022 in [facility] Office Visit from 02/09/2022 in [facility] Office Visit from 12/22/2021 in [facility]  Total GAD-7 Score 12 20 21 12 8       Mini-Mental    Flowsheet Row ECT Treatment from 07/28/2021 in [facility]  Total Score (max 30 points ) 30      PHQ2-9    Flowsheet Row Office Visit from 10/12/2022 in [facility] Office Visit from 06/16/2022 in [facility] Office Visit from 03/31/2022 in [facility] Office Visit from 02/09/2022 in [facility] Office Visit from 12/22/2021 in [facility]  PHQ-2 Total Score 4 6 6 6 4   PHQ-9 Total Score 11 18 19 18 12       Flowsheet Row Office Visit from 03/31/2022 in [facility] Office Visit from 02/09/2022 in [facility] Office Visit from 12/22/2021 in [facility]  C-SSRS RISK CATEGORY No Risk No Risk No Risk        Assessment and Plan:  Devyn Flocco is a 67 y.o. year old male with a history of depression, anxiety, parkinsonism on levodopa, diabetes, hypertension,  hyperlipidemia, carpal tunnel syndrome, s/p knee arthroscopy , who presents for follow up appointment for below.   1. MDD (major depressive disorder), recurrent episode, moderate (HCC) 2. Anxiety state Acute stressors include: loss of his father with liver cancer Feb 2024 Other stressors include: unemployment   History: depression since 1979, ECT in 2023 with limited benefit, originally on duloxetine 60 mg daily, bupropion 150 mg daily Abilify 30 mg daily   There has been overall improvement in depressive symptoms and anxiety since switching from duloxetine to sertraline/uptitration.  Will continue current dose along with bupropion and Abilify at this time adjunctive treatment for depression.  Will continue BuSpar for anxiety, clonazepam as needed for anxiety.  Will consider tapering down Abilify if he continues to have good benefit from sertraline to mitigate its risk on Parkinson disease.  Will also consider tapering off either of BuSpar or Klonopin to avoid polypharmacy.   3. Insomnia, unspecified type - He experiences hypersomnia, with reports of snoring during deep sleep. However, his wife is reluctant to pursue a sleep evaluation, as she doubts he has this condition.    Overall improving.  Will continue trazodone as needed for insomnia.    3. Fatigue, unspecified type   This is likely multifactorial, influenced by both Parkinson's disease and depression. May consider methylphenidate in the future if he has limited benefit from the above intervention.      5. Cognitive deficits Functional Status   IADL: Independent in the following:driving           Requires assistance with the following:  managing finances, medications (using pill box, his wife checks) ADL  Independent in the following: bathing and hygiene, feeding, continence, grooming and toileting, walking          Requires assistance with the following: Folate, Vitamin B12, TSH- low folic acid 08/2022. Otherwise wnl [facility] Neuropsych  assessment:  Etiology: parkinson, r/o VaD   He denies any subjective symptoms of memory loss.  However, his IADL is somewhat limited. Will plan to do further evaluation in the future visit.    # Alcohol use disorder in sustained remission Unchanged. He denies any alcohol use/craving for alcohol.  Will continue motivational interview.  Although the plan was to obtain vitamin B1, it was not checked at the recent blood test.  Will discuss this at his next visit.    # vitamin D deficiency # low folic acid Unclear intervention. Will discuss with him at the next visit.    Plan  Continue sertraline 100 mg daily  Continue bupropion 450 mg daily Continue Buspar 7.5 mg twice a day (restless leg from 10 mg BID) Continue Abilify 30 mg daily (EKG 426 msec, 08/2021) Continue Trazodone 100  mg at night as needed for sleep Continue clonazepam 0.5-1 mg twice a day as needed for anxiety- a refill left Next appointment- 8/29 at 10 am, IP - TSH- 4.238 10/2022   Past trials of medication: lexapro, duloxetine, venlafaxine, mirtazapine (rash), Abilify, quetiapine, clonazepam,    The patient demonstrates the following risk factors for suicide: Chronic risk factors for suicide include: psychiatric disorder of depression and chronic pain. Acute risk factors for suicide include: unemployment. Protective factors for this patient include: positive social support, coping skills and hope for the future. Considering these factors, the overall suicide risk at this point appears to be low. Patient is appropriate for outpatient follow up.    Collaboration of Care: Collaboration of Care: Other reviewed notes in Epic  Patient/Guardian was advised Release of  Information must be obtained prior to any record release in order to collaborate their care with an outside provider. Patient/Guardian was advised if they have not already done so to contact the registration department to sign all necessary forms in order for [place] to release  information regarding their care.   Consent: Patient/Guardian gives verbal consent for treatment and assignment of benefits for services provided during this visit. Patient/Guardian expressed understanding and agreed to proceed.    Neysa Hotter, MD 11/17/2022, 9:05 AM

## 2022-11-17 ENCOUNTER — Encounter: Payer: Self-pay | Admitting: Psychiatry

## 2022-11-17 ENCOUNTER — Ambulatory Visit (INDEPENDENT_AMBULATORY_CARE_PROVIDER_SITE_OTHER): Payer: Medicare Other | Admitting: Psychiatry

## 2022-11-17 VITALS — BP 138/80 | HR 70 | Temp 98.3°F | Ht 68.5 in | Wt 185.0 lb

## 2022-11-17 DIAGNOSIS — G4700 Insomnia, unspecified: Secondary | ICD-10-CM

## 2022-11-17 DIAGNOSIS — F331 Major depressive disorder, recurrent, moderate: Secondary | ICD-10-CM

## 2022-11-17 DIAGNOSIS — F411 Generalized anxiety disorder: Secondary | ICD-10-CM | POA: Diagnosis not present

## 2022-11-17 MED ORDER — ARIPIPRAZOLE 30 MG PO TABS: 30.0000 mg | ORAL_TABLET | Freq: Every day | ORAL | 1 refills | Status: DC

## 2022-11-17 MED ORDER — BUPROPION HCL ER (XL) 300 MG PO TB24: 300.0000 mg | ORAL_TABLET | Freq: Every day | ORAL | 5 refills | Status: DC

## 2022-11-17 MED ORDER — BUPROPION HCL ER (XL) 150 MG PO TB24: 150.0000 mg | ORAL_TABLET | Freq: Every day | ORAL | 5 refills | Status: DC

## 2022-11-17 MED ORDER — SERTRALINE HCL 100 MG PO TABS: 100.0000 mg | ORAL_TABLET | Freq: Every day | ORAL | 0 refills | Status: DC

## 2022-11-30 ENCOUNTER — Telehealth: Payer: Self-pay

## 2022-11-30 ENCOUNTER — Other Ambulatory Visit: Payer: Self-pay | Admitting: Psychiatry

## 2022-11-30 MED ORDER — CLONAZEPAM 1 MG PO TABS: 1.0000 mg | ORAL_TABLET | Freq: Two times a day (BID) | ORAL | 1 refills | Status: DC | PRN

## 2022-11-30 NOTE — Telephone Encounter (Signed)
Ordered

## 2022-11-30 NOTE — Telephone Encounter (Signed)
left message that he needs refills on the clonazepam 1mg  they had to change pharmacy because of insurance. (pharmacy has been updated in the system) he is using [facility] in [place].   Pt was last seen on 7-9 next appt 8-29

## 2022-12-01 NOTE — Telephone Encounter (Signed)
Pt.notified

## 2022-12-14 ENCOUNTER — Ambulatory Visit: Payer: 59 | Admitting: Psychiatry

## 2022-12-25 ENCOUNTER — Telehealth: Payer: Self-pay | Admitting: Psychiatry

## 2022-12-25 ENCOUNTER — Other Ambulatory Visit: Payer: Self-pay | Admitting: Psychiatry

## 2022-12-25 NOTE — Telephone Encounter (Signed)
I received a refill notification from the pharmacy for duloxetine 90 mg daily. This medication was discontinued a few months ago when sertraline was started. Could you please contact the patient and his wife to confirm that he is no longer taking it? If he is still taking it, please advise him to reduce the dose to 60 mg daily for one week, then 30 mg daily for one week, before discontinuing.

## 2022-12-25 NOTE — Telephone Encounter (Signed)
spoke with patient and his wife. they both stated that he was still taking. he was told to decrease to 60mg  for 1 week and then 30mg  for the 2nd week. per dr. Veto Kemps orders.

## 2022-12-30 ENCOUNTER — Other Ambulatory Visit: Payer: Self-pay | Admitting: Psychiatry
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
## 2023-01-07 ENCOUNTER — Encounter: Payer: Self-pay | Admitting: Psychiatry

## 2023-01-07 ENCOUNTER — Ambulatory Visit: Payer: Medicare Other | Admitting: Psychiatry

## 2023-01-07 DIAGNOSIS — F411 Generalized anxiety disorder: Secondary | ICD-10-CM | POA: Diagnosis not present

## 2023-01-07 DIAGNOSIS — F331 Major depressive disorder, recurrent, moderate: Secondary | ICD-10-CM | POA: Diagnosis not present

## 2023-01-07 DIAGNOSIS — R5383 Other fatigue: Secondary | ICD-10-CM

## 2023-01-07 DIAGNOSIS — G479 Sleep disorder, unspecified: Secondary | ICD-10-CM | POA: Diagnosis not present

## 2023-01-07 MED ORDER — ARIPIPRAZOLE 30 MG PO TABS: 30.0000 mg | ORAL_TABLET | Freq: Every day | ORAL | 1 refills | Status: DC

## 2023-01-07 MED ORDER — CLONAZEPAM 1 MG PO TABS: 1.0000 mg | ORAL_TABLET | Freq: Two times a day (BID) | ORAL | 1 refills | Status: DC | PRN

## 2023-01-07 MED ORDER — SERTRALINE HCL 100 MG PO TABS: 150.0000 mg | ORAL_TABLET | Freq: Every day | ORAL | 1 refills | Status: DC

## 2023-01-07 NOTE — Patient Instructions (Signed)
Increase sertraline 150 mg at night  Continue bupropion 450 mg daily Continue Buspar 7.5 mg twice a day  Continue Abilify 30 mg daily  Discontinue trazodone  Continue clonazepam 0.5-1 mg twice a day as needed for anxiety Next appointment- 10/22 at 10 30

## 2023-01-08 ENCOUNTER — Ambulatory Visit: Payer: Medicare Other | Admitting: Internal Medicine

## 2023-01-08 ENCOUNTER — Encounter: Payer: Self-pay | Admitting: Internal Medicine

## 2023-01-08 VITALS — BP 118/80 | HR 96 | Temp 97.2°F | Ht 68.0 in | Wt 180.0 lb

## 2023-01-08 DIAGNOSIS — R400 Somnolence: Secondary | ICD-10-CM | POA: Insufficient documentation

## 2023-01-08 DIAGNOSIS — F331 Major depressive disorder, recurrent, moderate: Secondary | ICD-10-CM

## 2023-01-08 NOTE — Patient Instructions (Signed)
Order- schedule home sleep test    dx somnolence  Please call us about 2 weeks after your test for results and recommendations

## 2023-01-08 NOTE — Assessment & Plan Note (Signed)
Managed by his Psychiatrist.

## 2023-01-08 NOTE — Progress Notes (Signed)
01/08/23- 66 yoM never smoker for sleep evaluation with concern of somnolence, courtesy of Neysa Hotter, MD [facility] Medical problem list includes HTN, DM2, Hyperlipidemia, Parkinson's Disease, Thoracic Radiculitis,  Depression,  -Clonazepam, Sinemet, Trazodone Epworth score-7 Body weight today-180 lbs He is here with male companion, Ms Tenny Craw, who does most of the talking for him and is a ble to comment on his sleep.  Concern is that he goes to bed between 8:00 and 11:00 PM, but then he stays in bed and apparently asleep until afternoon. Often when up, he is dozing in recliner. They describe snoring No complex parasomnias.  No ENT surgery.  Prior to Admission medications   Medication Sig Start Date End Date Taking? Authorizing Provider  ARIPiprazole (ABILIFY) 30 MG tablet Take 30 mg by mouth daily. 01/07/23  Yes [provider]  atenolol (TENORMIN) 50 MG tablet Take 50 mg by mouth daily. 06/14/20  Yes [provider]  buPROPion (WELLBUTRIN XL) 150 MG 24 hr tablet Take 1 tablet (150 mg total) by mouth daily. Total of 450 mg daily. Take along with 300 mg tab 11/28/22 05/27/23 Yes Hisada, Barbee Cough, MD  buPROPion (WELLBUTRIN XL) 300 MG 24 hr tablet Take 1 tablet (300 mg total) by mouth daily. Total of 450 mg daily. Take along with 150 mg tab 11/28/22 05/27/23 Yes Hisada, Barbee Cough, MD  busPIRone (BUSPAR) 5 MG tablet Take by mouth. 10/23/21  Yes [provider]  carbidopa-levodopa (SINEMET IR) 25-100 MG tablet Take 1 tablet by mouth 5 (five) times daily. 12/30/20  Yes [provider]  clonazePAM (KLONOPIN) 1 MG tablet Take 1 tablet (1 mg total) by mouth 2 (two) times daily as needed for anxiety. 01/31/23 04/01/23 Yes Hisada, Barbee Cough, MD  enalapril (VASOTEC) 10 MG tablet Take 10 mg by mouth daily. 04/01/16  Yes [provider]  ketoconazole (NIZORAL) 2 % shampoo Apply 1 application topically every other day. 05/17/20  Yes [provider]   metFORMIN (GLUCOPHAGE) 500 MG tablet Take 500 mg by mouth 2 (two) times daily with a meal.   Yes [provider]  rosuvastatin (CRESTOR) 20 MG tablet Take 20 mg by mouth daily. 04/21/21  Yes [provider]  sertraline (ZOLOFT) 100 MG tablet Take 1.5 tablets (150 mg total) by mouth at bedtime. 01/07/23 03/08/23 Yes Neysa Hotter, MD  sildenafil (VIAGRA) 50 MG tablet Take 50 mg by mouth daily as needed for erectile dysfunction.   Yes [provider]  VICTOZA 18 MG/3ML SOPN Inject 1.2 mg into the skin daily. 07/31/20  Yes [provider]  traZODone (DESYREL) 100 MG tablet Take 1 tablet (100 mg total) by mouth at bedtime as needed. Patient not taking: Reported on 01/08/2023 01/24/23 05/24/23  Neysa Hotter, MD   Past Medical History:  Diagnosis Date   Anxiety    Arthritis    Carpal tunnel syndrome    Depression    Diabetes (HCC)    Hyperlipidemia    Hypertension    Loss of teeth due to extraction    Parkinson's disease    Past Surgical History:  Procedure Laterality Date   CARPAL TUNNEL RELEASE Right 11/28/2020   Procedure: CARPAL TUNNEL RELEASE ENDOSCOPIC;  Surgeon: Christena Flake, MD;  Location: [facility] ORS;  Service: Orthopedics;  Laterality: Right;   CARPAL TUNNEL RELEASE Left 01/22/2021   Procedure: CARPAL TUNNEL RELEASE ENDOSCOPIC;  Surgeon: Christena Flake, MD;  Location: [facility] ORS;  Service: Orthopedics;  Laterality: Left;   KNEE ARTHROSCOPY Right  Family History  Problem Relation Age of Onset   Diabetes Mother    Hypertension Mother    CVA Father    Heart attack Father    Depression Sister    Depression Brother    Social History   Socioeconomic History   Marital status: Single    Spouse name: [name]   Number of children: Not on file   Years of education: Not on file   Highest education level: Not on file  Occupational History   Not on file  Tobacco Use   Smoking status: Never   Smokeless tobacco: Never  Vaping Use   Vaping status:  Never Used  Substance and Sexual Activity   Alcohol use: Never   Drug use: Never   Sexual activity: Not Currently  Other Topics Concern   Not on file  Social History Narrative   Lives with friend   Social Determinants of Health   Financial Resource Strain: Not on file  Food Insecurity: Not on file  Transportation Needs: Not on file  Physical Activity: Not on file  Stress: Not on file  Social Connections: Not on file  Intimate Partner Violence: Not on file   ROS-see HPI   + = positive Constitutional:    weight loss, night sweats, fevers, chills, fatigue, lassitude. HEENT:    headaches, difficulty swallowing, tooth/dental problems, sore throat,       sneezing, itching, ear ache, nasal congestion, post nasal drip, snoring CV:    chest pain, orthopnea, PND, swelling in lower extremities, anasarca,                                  dizziness, palpitations Resp:   shortness of breath with exertion or at rest.                productive cough,   non-productive cough, coughing up of blood.              change in color of mucus.  wheezing.   Skin:    rash or lesions. GI:  No-   heartburn, indigestion, abdominal pain, nausea, vomiting, diarrhea,                 change in bowel habits, loss of appetite GU: dysuria, change in color of urine, no urgency or frequency.   flank pain. MS:   joint pain, stiffness, decreased range of motion, back pain. Neuro-     nothing unusual Psych:  change in mood or affect.  +depression or anxiety.   memory loss.  OBJ- Physical Exam General- Alert, Oriented, Affect+very depressed affect, Distress- none acute, +not obese Skin- rash-none, lesions- none, excoriation- none Lymphadenopathy- none Head- atraumatic            Eyes- Gross vision intact, PERRLA, conjunctivae and secretions clear            Ears- Hearing, canals-normal            Nose- Clear, no-Septal dev, mucus, polyps, erosion, perforation             Throat- Mallampati IV , mucosa clear ,  drainage- none, tonsils- atrophic, +dentures Neck- flexible , trachea midline, no stridor , thyroid nl, carotid no bruit Chest - symmetrical excursion , unlabored           Heart/CV- RRR , no murmur , no gallop  , no rub, nl s1 s2                           -  JVD- none , edema- none, stasis changes- none, varices- none           Lung- clear to P&A, wheeze- none, cough- none , dullness-none, rub- none           Chest wall-  Abd-  Br/ Gen/ Rectal- Not done, not indicated Extrem- cyanosis- none, clubbing, none, atrophy- none, strength- nl Neuro- grossly intact to observation

## 2023-01-08 NOTE — Assessment & Plan Note (Signed)
His habit of staying in bed and apparently asleep for very pronged periods is more consistent with depression and possibly Parkinson's, rather than OSA We discussed testing and will do home sleep test. His companion isn't optimistic that he would cope easily with CPAP.

## 2023-01-13 ENCOUNTER — Other Ambulatory Visit: Payer: Self-pay | Admitting: Internal Medicine

## 2023-01-13 DIAGNOSIS — Z8739 Personal history of other diseases of the musculoskeletal system and connective tissue: Secondary | ICD-10-CM

## 2023-01-20 ENCOUNTER — Ambulatory Visit
Admission: RE | Admit: 2023-01-20 | Discharge: 2023-01-20 | Disposition: A | Discharge status: Home / Self Care | Payer: Medicare Other | Source: Ambulatory Visit | Attending: Internal Medicine | Admitting: Internal Medicine

## 2023-01-20 DIAGNOSIS — Z8739 Personal history of other diseases of the musculoskeletal system and connective tissue: Secondary | ICD-10-CM | POA: Diagnosis present

## 2023-02-02 ENCOUNTER — Telehealth: Payer: Self-pay | Admitting: Psychiatry

## 2023-02-02 NOTE — Telephone Encounter (Signed)
Does the patient being the sertraline, if the medication dosage increase is making him sleepy or other side effects he could reduce it back to 100 mg.  Please have him schedule a sooner appointment for further changes as needed with Dr. Vanetta Shawl.

## 2023-02-02 NOTE — Telephone Encounter (Signed)
Patient called and stated new medication has not been working and patient is feeling sleeping/feeling bad. Patient next appointment is 10/22.

## 2023-02-02 NOTE — Telephone Encounter (Signed)
I meant , "does the patient mean the dose increase of setraline "  Please disregard typo

## 2023-02-03 NOTE — Telephone Encounter (Signed)
spoke with donna she states that lay has not made any changes with the increase with the sertralin. she states he may need to try something else. she states that he was taking the sertraline 100mg  but it was increase to 150mg . she states he stays tired and sleep and has no motivation to want to do anything.

## 2023-02-03 NOTE — Telephone Encounter (Signed)
Will defer further changes to Dr.Hisada, please advice to make a sooner appointment .  Please have patient schedule a sooner appointment to be seen.

## 2023-02-05 NOTE — Telephone Encounter (Signed)
Could you ask if he is interested in trying venlafaxine as a replacement for sertraline? He hasn't tried that medication with me before, so I think it's worth exploring unless he experienced any side effects.  Also, could you check whether he was able to taper off Abilify?  On another note, TMS at [facility] is unable to provide that treatment. Could you check if they would consider referring him to [facility] in [place]? If they agree, please go ahead and make the referral.

## 2023-02-08 ENCOUNTER — Telehealth: Payer: Self-pay | Admitting: Psychiatry

## 2023-02-08 NOTE — Telephone Encounter (Signed)
Patient called stating sertraline medication increase is not working and needs to know what he should do. Patient's next appointment is October 22 and is on the waitlist.-Please advise

## 2023-02-08 NOTE — Telephone Encounter (Signed)
pt states that he could taper off the abilify and he willing to try whatever you feel is best. he said he would have to call back about the TMS he doesn't know if she wants to go that far.

## 2023-02-08 NOTE — Telephone Encounter (Signed)
[name]- please contact the patient if this has not been done (sent you this message before).  Could you ask if he is interested in trying venlafaxine as a replacement for sertraline? He hasn't tried that medication with me before, so I think it's worth exploring unless he experienced any side effects.  Also, could you check whether he was able to taper off Abilify?  On another note, TMS at [facility] is unable to provide that treatment. Could you check if they would consider referring him to [facility] in [place]? If they agree, please go ahead and make the referral.

## 2023-02-09 ENCOUNTER — Telehealth: Payer: Self-pay | Admitting: Psychiatry

## 2023-02-09 ENCOUNTER — Other Ambulatory Visit: Payer: Self-pay | Admitting: Psychiatry

## 2023-02-09 NOTE — Telephone Encounter (Signed)
Given that he has not tapered off Abilify yet, let's first try this approach to see if it makes any difference. At the next visit, we can certainly consider trying venlafaxine in place of sertraline.

## 2023-02-09 NOTE — Telephone Encounter (Signed)
Given that the appointment is approaching, please advise to decrease Abilify to 15 mg daily until the next appointment.

## 2023-02-09 NOTE — Telephone Encounter (Signed)
was notified of the changes and they are willing to try the venlafaxine.

## 2023-02-09 NOTE — Telephone Encounter (Signed)
Please call [name] to review the change in medication and how to taper off Abilify. Patient was asleep when contacted and did not get all the information. And wants to try this first before referred to [facility].

## 2023-02-09 NOTE — Telephone Encounter (Signed)
notified of instructions per dr. Vanetta Shawl

## 2023-02-10 NOTE — Telephone Encounter (Signed)
I called Ricky [name]. We discussed the following: decrease Abilify to 15 mg daily until the next appointment, continue sertraline at the current dose for now, and they are not interested in TMS at this time. She expressed understanding of this plan.

## 2023-02-10 NOTE — Telephone Encounter (Signed)
thank you Dr. Vanetta Shawl for calling

## 2023-02-10 NOTE — Telephone Encounter (Signed)
Dr. Vanetta Shawl called and spoke with donna

## 2023-02-10 NOTE — Telephone Encounter (Signed)
he would like for you to call her. she states that one minute you want to stop a medication and start something else she said that she though you was going to send in venlafaxine and the pharmacy does not have. so she wants to speak with dr. Vanetta Shawl to find out what he suppose to take and not be taking.

## 2023-02-16 DIAGNOSIS — G4733 Obstructive sleep apnea (adult) (pediatric): Secondary | ICD-10-CM | POA: Diagnosis not present

## 2023-02-16 DIAGNOSIS — R400 Somnolence: Secondary | ICD-10-CM

## 2023-02-24 ENCOUNTER — Other Ambulatory Visit: Payer: Self-pay | Admitting: Psychiatry

## 2023-02-24 NOTE — Progress Notes (Unsigned)
BH MD/PA/NP OP Progress Note  02/25/2023 5:25 PM Ricky [name]  MRN:  [id number]  Chief Complaint:  Chief Complaint  Patient presents with   Follow-up   HPI:  This is a follow-up appointment for depression, anxiety.   According to the chart review, the following events have occurred since the last visit: The patient had a home sleep test. The patient demonstrated a severe level of abnormal breathing events. AHI 31.2 /hr, snoring with oxygen desaturation to a nadir of 89% 01/2023  He states that there has been not much change.  He does not feel like doing anything, and not interested.  Although he did having a good time with his granddaughter, he does not like to be around with other people.  He had to take Klonopin prior to going there as he was very nervous and had intense anxiety. The patient has mood symptoms as in PHQ-9/GAD-7.  He feels tired.  He does not think higher dose of sertraline made any difference. He sleeps well without trazodone, and continues to have hypersomnolence.  [name]'s significant other presents to the visit.  She has not seen any difference.  They have not lowered the dose of Abilify yet, although they are willing to. [name] has significant issues with focus, and she wonders if she has ADHD. She was informed that it is difficult to discern the situation at this time due to the many contributing factors, including mood symptoms and hypersomnia. He sleeps 10 pm- 3 pm.   He tends to stay in the recliner. They have not heard about the recent sleep test result. She does not think he can use cpap machine due to significant anxiety. She agreed to discuss this with the provider for any available options.   Wt Readings from Last 3 Encounters:  02/25/23 184 lb 6.4 oz (83.6 kg)  01/08/23 180 lb (81.6 kg)  11/17/22 185 lb (83.9 kg)     Substance use   Tobacco Alcohol Other substances/  Current   denies Denies   Past   6-12 pack a day, last in 2019 denies  Past Treatment           Daily routine: sit in the chair Exercise: none Employment: unemployed since Dec 2021. Quit due to pain, used to work as a Lawyer since age 80 Support: [name]/fiance, older son, his parents/age 14's, who live next door Household: fiance Marital status: married (divorced twice) Number of children: 3 children, age 34-41 Education: 12 th grade He was born and grew up in [place]. He has good relationship with his parents as a child, although he suffers from depression and anxiety "since born."    Visit Diagnosis:    ICD-10-CM   1. MDD (major depressive disorder), recurrent episode, moderate (HCC)  F33.1     2. Anxiety state  F41.1     3. Fatigue, unspecified type  R53.83     4. Obstructive sleep apnea  G47.33       Past Psychiatric History: Please see initial evaluation for full details. I have reviewed the history. No updates at this time.     Past Medical History:  Past Medical History:  Diagnosis Date   Anxiety    Arthritis    Carpal tunnel syndrome    Depression    Diabetes (HCC)    Hyperlipidemia    Hypertension    Loss of teeth due to extraction    Parkinson's disease [facility])     Past Surgical History:  Procedure Laterality Date   CARPAL TUNNEL RELEASE Right 11/28/2020   Procedure: CARPAL TUNNEL RELEASE ENDOSCOPIC;  Surgeon: Christena Flake, MD;  Location: [facility] ORS;  Service: Orthopedics;  Laterality: Right;   CARPAL TUNNEL RELEASE Left 01/22/2021   Procedure: CARPAL TUNNEL RELEASE ENDOSCOPIC;  Surgeon: Christena Flake, MD;  Location: [facility] ORS;  Service: Orthopedics;  Laterality: Left;   KNEE ARTHROSCOPY Right     Family Psychiatric History: Please see initial evaluation for full details. I have reviewed the history. No updates at this time.     Family History:  Family History  Problem Relation Age of Onset   Diabetes Mother    Hypertension Mother    CVA Father    Heart attack Father    Depression Sister    Depression Brother      Social History:  Social History   Socioeconomic History   Marital status: Single    Spouse name: [name]   Number of children: Not on file   Years of education: Not on file   Highest education level: Not on file  Occupational History   Not on file  Tobacco Use   Smoking status: Never   Smokeless tobacco: Never  Vaping Use   Vaping status: Never Used  Substance and Sexual Activity   Alcohol use: Never   Drug use: Never   Sexual activity: Not Currently  Other Topics Concern   Not on file  Social History Narrative   Lives with friend   Social Determinants of Health   Financial Resource Strain: Not on file  Food Insecurity: Not on file  Transportation Needs: Not on file  Physical Activity: Not on file  Stress: Not on file  Social Connections: Not on file    Allergies:  Allergies  Allergen Reactions   Mirtazapine Rash    Metabolic Disorder Labs: Lab Results  Component Value Date   HGBA1C 5.2 08/25/2021   MPG 102.54 08/25/2021   No results found for: "PROLACTIN" No results found for: "CHOL", "TRIG", "HDL", "CHOLHDL", "VLDL", "LDLCALC" Lab Results  Component Value Date   TSH 2.715 08/13/2022   TSH 3.886 08/25/2021    Therapeutic Level Labs: No results found for: "LITHIUM" No results found for: "VALPROATE" No results found for: "CBMZ"  Current Medications: Current Outpatient Medications  Medication Sig Dispense Refill   atenolol (TENORMIN) 50 MG tablet Take 50 mg by mouth daily.     buPROPion (WELLBUTRIN XL) 150 MG 24 hr tablet Take 1 tablet (150 mg total) by mouth daily. Total of 450 mg daily. Take along with 300 mg tab 30 tablet 5   buPROPion (WELLBUTRIN XL) 300 MG 24 hr tablet Take 1 tablet (300 mg total) by mouth daily. Total of 450 mg daily. Take along with 150 mg tab 30 tablet 5   busPIRone (BUSPAR) 5 MG tablet Take by mouth.     carbidopa-levodopa (SINEMET IR) 25-100 MG tablet Take 1 tablet by mouth 5 (five) times daily.     enalapril (VASOTEC)  10 MG tablet Take 10 mg by mouth daily.     ketoconazole (NIZORAL) 2 % shampoo Apply 1 application topically every other day.     metFORMIN (GLUCOPHAGE) 500 MG tablet Take 500 mg by mouth 2 (two) times daily with a meal.     rosuvastatin (CRESTOR) 20 MG tablet Take 20 mg by mouth daily.     sildenafil (VIAGRA) 50 MG tablet Take 50 mg by mouth daily as needed for erectile dysfunction.  VICTOZA 18 MG/3ML SOPN Inject 1.2 mg into the skin daily.     busPIRone (BUSPAR) 7.5 MG tablet Take 1 tablet (7.5 mg total) by mouth 2 (two) times daily. 60 tablet 3   [START ON 04/01/2023] clonazePAM (KLONOPIN) 1 MG tablet Take 1 tablet (1 mg total) by mouth 2 (two) times daily as needed for anxiety. 60 tablet 0   [START ON 03/08/2023] sertraline (ZOLOFT) 100 MG tablet Take 1.5 tablets (150 mg total) by mouth at bedtime. 45 tablet 1   No current facility-administered medications for this visit.     Musculoskeletal: Strength & Muscle Tone: within normal limits Gait & Station:  slow Patient leans: N/A  Psychiatric Specialty Exam: Review of Systems  Psychiatric/Behavioral:  Positive for decreased concentration, dysphoric mood and sleep disturbance. Negative for agitation, behavioral problems, confusion, hallucinations, self-injury and suicidal ideas. The patient is nervous/anxious. The patient is not hyperactive.   All other systems reviewed and are negative.   Blood pressure 116/75, pulse (!) 57, temperature (!) 97.4 F (36.3 C), temperature source Skin, height 5\' 8"  (1.727 m), weight 184 lb 6.4 oz (83.6 kg).Body mass index is 28.04 kg/m.  General Appearance: Well Groomed  Eye Contact:  Good  Speech:  Clear and Coherent  Volume:  Normal  Mood:  Depressed  Affect:  Appropriate and masked face  Thought Process:  Coherent  Orientation:  Full (Time, Place, and Person)  Thought Content: Logical   Suicidal Thoughts:  No  Homicidal Thoughts:  No  Memory:  Immediate;   Good  Judgement:  Good  Insight:   Good  Psychomotor Activity:   resting tremors in his arms, mild cogwheel rigidity in bilateral arms, no TD  Concentration:  Concentration: Good and Attention Span: Good  Recall:  Good  Fund of Knowledge: Good  Language: Good  Akathisia:  No  Handed:  Right  AIMS (if indicated):   Assets:  Communication Skills Desire for Improvement  ADL's:  Intact  Cognition: WNL  Sleep:   hypersomnia   Screenings: ECT-MADRS    Flowsheet Row ECT Treatment from 07/28/2021 in [facility]  MADRS Total Score 41      GAD-7    Flowsheet Row Office Visit from 02/25/2023 in [facility] Office Visit from 01/07/2023 in [facility] Office Visit from 10/12/2022 in [facility] Office Visit from 06/16/2022 in [facility] Office Visit from 03/31/2022 in [facility]  Total GAD-7 Score 18 18 12 20 21       Mini-Mental    Flowsheet Row ECT Treatment from 07/28/2021 in [facility]  Total Score (max 30 points ) 30      PHQ2-9    Flowsheet Row Office Visit from 02/25/2023 in [facility] Office Visit from 01/07/2023 in [facility] Office Visit from 10/12/2022 in [facility] Office Visit from 06/16/2022 in [facility] Office Visit from 03/31/2022 in [facility]  PHQ-2 Total Score 5 6 4 6 6   PHQ-9 Total Score 17 22 11 18 19       Flowsheet Row Office Visit from 03/31/2022 in [facility] Office Visit from 02/09/2022 in [facility] Office Visit from 12/22/2021 in [facility]  C-SSRS RISK CATEGORY No Risk No Risk No Risk        Assessment and Plan:  Cam Dauphin is a 67 y.o. year old male with a history of depression, anxiety, parkinsonism on levodopa, diabetes, hypertension, hyperlipidemia, carpal tunnel syndrome, s/p knee arthroscopy , who presents for follow up appointment for below.   1. MDD (major depressive disorder), recurrent episode, moderate (HCC) 2. Anxiety state Acute stressors include: loss of his father with liver cancer Feb 2024 Other stressors include: unemployment   History: depression since 1979, ECT in 2023 with limited benefit, originally on duloxetine 60 mg daily, bupropion 150 mg daily Abilify 30 mg daily   There has been no significant change since uptitration of sertraline, and he continues to experience depressive symptoms, anxiety with  prominent fatigue anhedonia.  They have not been tapering off Abilify as discussed previously.  We will first work on this to avoid polypharmacy given there was no significant difference in his mood when it was discontinued last year, although this medication was accidentally restarted several months ago. This would also help to minimize the risk of Parkinsonian symptoms. He is  willing to consider TMS if it is close to their home. Will make a referral to [facility]. In the mean time, will maintain other medication regimen with the hope that the intervention for sleep apnea would improve with his mood symptoms to some extent.   Will continue current dose of sertraline, bupropion to target depression, and Buspar, clonazepam as needed for anxiety.  3. Fatigue, unspecified type 4. Obstructive sleep apnea He continues to experience hypersomnia. According to the recent sleep study, he has severe obstructive sleep apnea, AHI of 31.  They were advise to contact the clinic for further guidance.  He was able to discontinue trazodone without issues. It is noted that, although tapering  off clonazepam is preferable, he experiences significant subjective worsening of anxiety when attempting to reduce the dosage. We will continue to work on this gradually in the future.  5. Cognitive deficits Functional Status   IADL: Independent in the following:driving (advised to refrain from driving 06/4399)           Requires assistance with the following:  managing finances, medications (using pill box, his wife checks) ADL  Independent in the following: bathing and hygiene, feeding, continence, grooming and toileting, walking          Requires assistance with the following: Folate, Vitamin B12, TSH- low folic acid 08/2022. Otherwise wnl Images Brain MRI 08/2021: There is no evidence of an acute infarct, intracranial hemorrhage, mass, midline shift, or extra-axial fluid collection.Small T2 hyperintensities in the cerebral white matter bilaterally are nonspecific but compatible with mild chronic small vessel ischemic disease. Dilated perivascular spaces are noted in the basal ganglia bilaterally. There is mild generalized cerebral atrophy. Neuropsych assessment:  Etiology: parkinson, r/o VaD   He denies any subjective symptoms of memory loss.  However, his IADL is somewhat limited. Will plan to do further evaluation in the future visit.    # Alcohol use disorder in sustained remission Unchanged. He denies any alcohol use/craving for alcohol.  Will continue motivational interview.    # vitamin D deficiency # low folic acid It is not clear whether he has been followed by his primary care for these issues.  Will discuss with him at the next visit.    Plan  Continue  sertraline 150 mg at night  Continue bupropion 450 mg daily Continue Buspar 7.5 mg twice a day (  restless leg from 10 mg BID) Decrease Abilify 15 mg for two weeks, then discontinue  (EKG 426 msec, 08/2021) Continue clonazepam 0.5-1 mg twice a day as needed for anxiety Referral for TMS (they would like his significant other, [name]  to be called, [phone] (Mobile) ) Next appointment- 12/17 at 10 30 for IP, and on the waiting list They are advised to contact the sleep clinic regarding the result - TSH- 4.238 10/2022   Past trials of medication (the following medication caused either side effect, or limited benefit): lexapro, duloxetine, venlafaxine, mirtazapine (rash), Abilify, quetiapine    The patient demonstrates the following risk factors for suicide: Chronic risk factors for suicide include: psychiatric disorder of depression and chronic pain. Acute risk factors for suicide include: unemployment. Protective factors for this patient include: positive social support, coping skills and hope for the future. Considering these factors, the overall suicide risk at this point appears to be low. Patient is appropriate for outpatient follow up.      Collaboration of Care: Collaboration of Care: Other reviewed notes in Epic  Patient/Guardian was advised Release of Information must be obtained prior to any record release in order to collaborate their care with an outside provider. Patient/Guardian was advised if they have not already done so to contact the registration department to sign all necessary forms in order for [place] to release information regarding their care.   Consent: Patient/Guardian gives verbal consent for treatment and assignment of benefits for services provided during this visit. Patient/Guardian expressed understanding and agreed to proceed.    The duration of the time spent on the following activities on the date of the encounter was 40 minutes.   Preparing to see the patient (e.g., review of test, records)  Obtaining and/or reviewing separately obtained history  Performing a medically necessary exam and/or evaluation  Counseling and educating the patient/family/caregiver  Ordering medications, tests, or procedures  Referring and communicating with other healthcare professionals (when not  reported separately)  Documenting clinical information in the electronic or paper health record  Independently interpreting results of tests/labs and communication of results to the family or caregiver  Care coordination (when not reported separately)   Neysa Hotter, MD 02/25/2023, 5:25 PM

## 2023-02-25 ENCOUNTER — Encounter: Payer: Self-pay | Admitting: Psychiatry

## 2023-02-25 ENCOUNTER — Ambulatory Visit (INDEPENDENT_AMBULATORY_CARE_PROVIDER_SITE_OTHER): Payer: Medicare Other | Admitting: Psychiatry

## 2023-02-25 VITALS — BP 116/75 | HR 57 | Temp 97.4°F | Ht 68.0 in | Wt 184.4 lb

## 2023-02-25 DIAGNOSIS — G4733 Obstructive sleep apnea (adult) (pediatric): Secondary | ICD-10-CM

## 2023-02-25 DIAGNOSIS — F331 Major depressive disorder, recurrent, moderate: Secondary | ICD-10-CM | POA: Diagnosis not present

## 2023-02-25 DIAGNOSIS — R5383 Other fatigue: Secondary | ICD-10-CM

## 2023-02-25 DIAGNOSIS — F411 Generalized anxiety disorder: Secondary | ICD-10-CM

## 2023-02-25 MED ORDER — CLONAZEPAM 1 MG PO TABS: 1.0000 mg | ORAL_TABLET | Freq: Two times a day (BID) | ORAL | 0 refills | Status: DC | PRN

## 2023-02-25 MED ORDER — SERTRALINE HCL 100 MG PO TABS: 150.0000 mg | ORAL_TABLET | Freq: Every day | ORAL | 1 refills | Status: DC

## 2023-02-25 NOTE — Patient Instructions (Signed)
Continue  sertraline 150 mg at night  Continue bupropion 450 mg daily Continue Buspar 7.5 mg twice a day  Decrease Abilify 15 mg for two weeks, then discontinue Continue clonazepam 0.5-1 mg twice a day as needed for anxiety Referral for TMS Next appointment- 12/17 at 10 30

## 2023-03-01 ENCOUNTER — Other Ambulatory Visit: Payer: Self-pay | Admitting: Psychiatry

## 2023-03-02 ENCOUNTER — Ambulatory Visit: Payer: Medicare Other | Admitting: Psychiatry

## 2023-03-29 ENCOUNTER — Telehealth: Payer: Self-pay | Admitting: Psychiatry

## 2023-03-29 NOTE — Telephone Encounter (Signed)
Patient friend called stating you had discontinued the ability for him to take and since then his sleeping has gotten worse and sleeping longer till afternoon. And he is still not sleeping at night.

## 2023-03-29 NOTE — Telephone Encounter (Signed)
Please contact them to see if they can schedule an earlier appointment to address this issue. Also, advise them to remain on the current medication regimen for now.

## 2023-04-03 NOTE — Progress Notes (Unsigned)
BH MD/PA/NP OP Progress Note  04/06/2023 1:04 PM Ricky [name]  MRN:  [id number]  Chief Complaint:  Chief Complaint  Patient presents with   Follow-up   HPI:  This is a follow-up appointment for depression, anxiety and insomnia.  The appointment was made sooner due to concern of worsening in his mood.  He states that he is not doing well since Abilify was discontinued a few weeks ago.  They are just getting worse.  His mind is racing, and has worsening in depression.  He is worried about payment, a tree, which he needs to take care of, and his mother and others.  He reports his frustration that he does not understand he cannot take clonazepam three times (was on that dose longer than one year ago), although it was working.  Although he may be interested in TMS, he does not think he can go to the appointment due to insomnia.  He reports decrease in appetite, and is concerned about weight loss.  He feels irritable. He denies SI, HI, hallucinations.   [name], his wife presents to the visit.  She believes his depression has been getting worse.  She does not notice any change in his movement, facial expression or alertness.  He does not do anything. She believes clonazepam is the only medication, which appears to be helping for him to be calm. She wonders if he can take more clonazepam. She expressed understanding of the plan.    Wt Readings from Last 3 Encounters:  04/06/23 176 lb 9.6 oz (80.1 kg)  02/25/23 184 lb 6.4 oz (83.6 kg)  01/08/23 180 lb (81.6 kg)     Substance use   Tobacco Alcohol Other substances/  Current   denies Denies   Past   6-12 pack a day, last in 2019 denies  Past Treatment          Daily routine: sit in the chair Exercise: none Employment: unemployed since Dec 2021. Quit due to pain, used to work as a Lawyer since age 39 Support: [name]/fiance, older son, his parents/age 62's, who live next door Household: fiance Marital status: married (divorced  twice) Number of children: 3 children, age 29-41 Education: 12 th grade He was born and grew up in [place]. He has good relationship with his parents as a child, although he suffers from depression and anxiety "since born."  Visit Diagnosis:    ICD-10-CM   1. MDD (major depressive disorder), recurrent episode, moderate (HCC)  F33.1     2. Anxiety state  F41.1     3. Fatigue, unspecified type  R53.83     4. Obstructive sleep apnea  G47.33       Past Psychiatric History: Please see initial evaluation for full details. I have reviewed the history. No updates at this time.     Past Medical History:  Past Medical History:  Diagnosis Date   Anxiety    Arthritis    Carpal tunnel syndrome    Depression    Diabetes (HCC)    Hyperlipidemia    Hypertension    Loss of teeth due to extraction    Parkinson's disease [facility])     Past Surgical History:  Procedure Laterality Date   CARPAL TUNNEL RELEASE Right 11/28/2020   Procedure: CARPAL TUNNEL RELEASE ENDOSCOPIC;  Surgeon: Christena Flake, MD;  Location: [facility] ORS;  Service: Orthopedics;  Laterality: Right;   CARPAL TUNNEL RELEASE Left 01/22/2021   Procedure: CARPAL TUNNEL RELEASE ENDOSCOPIC;  Surgeon: Christena Flake, MD;  Location: [facility] ORS;  Service: Orthopedics;  Laterality: Left;   KNEE ARTHROSCOPY Right     Family Psychiatric History: Please see initial evaluation for full details. I have reviewed the history. No updates at this time.     Family History:  Family History  Problem Relation Age of Onset   Diabetes Mother    Hypertension Mother    CVA Father    Heart attack Father    Depression Sister    Depression Brother     Social History:  Social History   Socioeconomic History   Marital status: Single    Spouse name: [name]   Number of children: Not on file   Years of education: Not on file   Highest education level: Not on file  Occupational History   Not on file  Tobacco Use   Smoking status: Never    Smokeless tobacco: Never  Vaping Use   Vaping status: Never Used  Substance and Sexual Activity   Alcohol use: Never   Drug use: Never   Sexual activity: Not Currently  Other Topics Concern   Not on file  Social History Narrative   Lives with friend   Social Determinants of Health   Financial Resource Strain: Not on file  Food Insecurity: Not on file  Transportation Needs: Not on file  Physical Activity: Not on file  Stress: Not on file  Social Connections: Not on file    Allergies:  Allergies  Allergen Reactions   Mirtazapine Rash    Metabolic Disorder Labs: Lab Results  Component Value Date   HGBA1C 5.2 08/25/2021   MPG 102.54 08/25/2021   No results found for: "PROLACTIN" No results found for: "CHOL", "TRIG", "HDL", "CHOLHDL", "VLDL", "LDLCALC" Lab Results  Component Value Date   TSH 2.715 08/13/2022   TSH 3.886 08/25/2021    Therapeutic Level Labs: No results found for: "LITHIUM" No results found for: "VALPROATE" No results found for: "CBMZ"  Current Medications: Current Outpatient Medications  Medication Sig Dispense Refill   atenolol (TENORMIN) 50 MG tablet Take 50 mg by mouth daily.     buPROPion (WELLBUTRIN XL) 150 MG 24 hr tablet Take 1 tablet (150 mg total) by mouth daily. Total of 450 mg daily. Take along with 300 mg tab 30 tablet 5   buPROPion (WELLBUTRIN XL) 300 MG 24 hr tablet Take 1 tablet (300 mg total) by mouth daily. Total of 450 mg daily. Take along with 150 mg tab 30 tablet 5   busPIRone (BUSPAR) 5 MG tablet Take by mouth.     busPIRone (BUSPAR) 7.5 MG tablet Take 1 tablet (7.5 mg total) by mouth 2 (two) times daily. 60 tablet 3   carbidopa-levodopa (SINEMET IR) 25-100 MG tablet Take 1 tablet by mouth 5 (five) times daily.     clonazePAM (KLONOPIN) 1 MG tablet Take 1 tablet (1 mg total) by mouth 2 (two) times daily as needed for anxiety. 60 tablet 0   enalapril (VASOTEC) 10 MG tablet Take 10 mg by mouth daily.     ketoconazole (NIZORAL) 2  % shampoo Apply 1 application topically every other day.     metFORMIN (GLUCOPHAGE) 500 MG tablet Take 500 mg by mouth 2 (two) times daily with a meal.     QUEtiapine (SEROQUEL) 25 MG tablet Take 1 tablet (25 mg total) by mouth at bedtime. 30 tablet 1   rosuvastatin (CRESTOR) 20 MG tablet Take 20 mg by mouth daily.  sertraline (ZOLOFT) 100 MG tablet Take 1.5 tablets (150 mg total) by mouth at bedtime. 45 tablet 1   sildenafil (VIAGRA) 50 MG tablet Take 50 mg by mouth daily as needed for erectile dysfunction.     VICTOZA 18 MG/3ML SOPN Inject 1.2 mg into the skin daily.     No current facility-administered medications for this visit.     Musculoskeletal: Strength & Muscle Tone: within normal limits Gait & Station: normal Patient leans: N/A  Psychiatric Specialty Exam: Review of Systems  Psychiatric/Behavioral:  Positive for dysphoric mood and sleep disturbance. Negative for agitation, behavioral problems, confusion, decreased concentration, hallucinations, self-injury and suicidal ideas. The patient is nervous/anxious. The patient is not hyperactive.   All other systems reviewed and are negative.   Blood pressure 118/78, pulse (!) 58, temperature 98.3 F (36.8 C), temperature source Temporal, height 5\' 8"  (1.727 m), weight 176 lb 9.6 oz (80.1 kg), SpO2 93%.Body mass index is 26.85 kg/m.  General Appearance: Well Groomed  Eye Contact:  Good  Speech:  Clear and Coherent  Volume:  Normal  Mood:  Anxious  Affect:  Appropriate, Congruent, and less masked face  Thought Process:  Coherent  Orientation:  Full (Time, Place, and Person)  Thought Content: Logical   Suicidal Thoughts:  No  Homicidal Thoughts:  No  Memory:  Immediate;   Good  Judgement:  Good  Insight:  Good  Psychomotor Activity:  Normal  Concentration:  Concentration: Good and Attention Span: Good  Recall:  Good  Fund of Knowledge: Good  Language: Good  Akathisia:  No  Handed:  Right  AIMS (if indicated): not  done  Assets:  Communication Skills Desire for Improvement  ADL's:  Intact  Cognition: WNL  Sleep:  Poor   Screenings: ECT-MADRS    Flowsheet Row ECT Treatment from 07/28/2021 in [facility]  MADRS Total Score 41      GAD-7    Flowsheet Row Office Visit from 02/25/2023 in [facility] Office Visit from 01/07/2023 in [facility] Office Visit from 10/12/2022 in [facility] Office Visit from 06/16/2022 in [facility] Office Visit from 03/31/2022 in [facility]  Total GAD-7 Score 18 18 12 20 21       Mini-Mental    Flowsheet Row ECT Treatment from 07/28/2021 in [facility]  Total Score (max 30 points ) 30      PHQ2-9    Flowsheet Row Office Visit from 02/25/2023 in [facility] Office Visit from 01[facility]07/2023 in [facility] Office Visit from 10/12/2022 in [facility] Office Visit from 06/16/2022 in [facility] Office Visit from 03/31/2022 in [facility]  PHQ-2 Total Score 5 6 4 6 6   PHQ-9 Total Score 17 22 11 18 19       Flowsheet Row Office Visit from 03/31/2022 in [facility] Office Visit from 02/09/2022 in [facility] Office Visit from 12/22/2021 in [facility]  C-SSRS RISK CATEGORY No Risk No Risk No Risk        Assessment and Plan:  Kipper Wertheimer is a 67 y.o. year old male with a history of depression, anxiety, parkinsonism on levodopa, diabetes, hypertension, hyperlipidemia, carpal tunnel syndrome, s/p  knee arthroscopy , who presents for follow up appointment for below.   1. MDD (major depressive disorder), recurrent episode, moderate (HCC) 2. Anxiety state Acute stressors include: loss of his father with liver cancer Feb 2024 Other stressors include: unemployment   History: depression since 1979, ECT in 2023 with limited benefit, originally on duloxetine 60 mg daily, bupropion 150 mg daily Abilify 30 mg daily   While he appears to have less masked face, and more alert on today's evaluation, both the patient and his wife reports significant worsening in his depressive symptoms, irritability, anxiety and racing thoughts since discontinuation of Abilify.  It is unclear whether this is due to the effectiveness of Abilify, more discontinuation symptoms.  The medication was discontinued due to limited benefit, and to avoid polypharmacy in the context of Parkinson's symptoms.  Will start lower dose of quetiapine to target depression, anxiety while monitoring any adverse reaction.  Discussed potential risk of drowsiness. Noted that did have TMS evaluation, and that they will be able to provide a treatment, he wants to hold this at this time until his symptoms become more manageable. Will continue sertraline, bupropion to target depression. Will continue buspar for anxiety.  Will continue clonazepam as needed for anxiety.   # benzodiazepine dependence (prescribed) - tapered down 1 mg BID since April 2023 He reports significant dissatisfaction of not uptitrated back clonazepam due to his ongoing anxiety.  Noted that the dose has not been changed over the past year.  Discussed potential risk of fall, oversedation especially given he has Parkinson's.   3. Fatigue, unspecified type 4. Obstructive sleep apnea - According to the recent sleep study, he has severe obstructive sleep apnea, AHI of 31.  He will have a visit regarding his family test. It is noted that, although tapering off clonazepam is preferable,  he experiences significant subjective worsening of anxiety when attempting to reduce the dosage. We will continue to work on this gradually in the future.    5. Cognitive deficits Functional Status   IADL: Independent in the following:driving (advised to refrain from driving 0/9811)           Requires assistance with the following:  managing finances, medications (using pill box, his wife checks) ADL  Independent in the following: bathing and hygiene, feeding, continence, grooming and toileting, walking          Requires assistance with the following: Folate, Vitamin B12, TSH- low folic acid 08/2022. Otherwise wnl Images Brain MRI 08/2021: There is no evidence of an acute infarct, intracranial hemorrhage, mass, midline shift, or extra-axial fluid collection.Small T2 hyperintensities in the cerebral white matter bilaterally are nonspecific but compatible with mild chronic small vessel ischemic disease. Dilated perivascular spaces are noted in the basal ganglia bilaterally. There is mild generalized cerebral atrophy. Neuropsych assessment:  Etiology: parkinson, r/o VaD   He denies any subjective symptoms of memory loss.  However, his IADL is somewhat limited. Will plan to do further evaluation in the future visit.    # Alcohol use disorder in sustained remission Unchanged. He denies any alcohol use/craving for alcohol.  Will continue motivational interview.    # vitamin D deficiency # low folic acid It is not clear whether he has been followed by his primary care for these issues.  Will discuss with him at the next visit.    Plan  Continue  sertraline 150 mg at night  Continue bupropion 450 mg daily Continue Buspar 7.5 mg twice a day (restless leg from 10  mg BID) Start quetiapine 25 mg at night (EKG 426 msec, 08/2021) Continue clonazepam 0.5-1 mg twice a day as needed for anxiety Next appointment- 12/17 at 10 30 for IP, and on the waiting list Referral for TMS (they would like his significant  other, [name] to be called, [phone] [facility]) )  They are advised to contact the sleep clinic regarding the result - TSH- 4.238 10/2022   Past trials of medication (the following medication caused either side effect, or limited benefit): lexapro, duloxetine, venlafaxine, mirtazapine (rash), Abilify, quetiapine    The patient demonstrates the following risk factors for suicide: Chronic risk factors for suicide include: psychiatric disorder of depression and chronic pain. Acute risk factors for suicide include: unemployment. Protective factors for this patient include: positive social support, coping skills and hope for the future. Considering these factors, the overall suicide risk at this point appears to be low. Patient is appropriate for outpatient follow up.      Collaboration of Care: Collaboration of Care: Other reviewed notes in Epic  Patient/Guardian was advised Release of Information must be obtained prior to any record release in order to collaborate their care with an outside provider. Patient/Guardian was advised if they have not already done so to contact the registration department to sign all necessary forms in order for [place] to release information regarding their care.   Consent: Patient/Guardian gives verbal consent for treatment and assignment of benefits for services provided during this visit. Patient/Guardian expressed understanding and agreed to proceed.    Neysa Hotter, MD 04/06/2023, 1:04 PM

## 2023-04-05 ENCOUNTER — Telehealth: Payer: Self-pay | Admitting: Psychiatry

## 2023-04-05 NOTE — Telephone Encounter (Signed)
Could you contact and clarify if they are asking about the number of appointments attended in a specific year, or the total number of appointments since the initial visit? Also, could you ask them the purpose of this information?

## 2023-04-05 NOTE — Telephone Encounter (Signed)
Stated the Ricky [name] needs something in writing regarding how many appointments Ricky [name] has attended with Dr. Vanetta [name]. Wanted to let us know since it may take a while to have done. Will sign ROI at appointment on 04/06/23

## 2023-04-06 ENCOUNTER — Ambulatory Visit (INDEPENDENT_AMBULATORY_CARE_PROVIDER_SITE_OTHER): Payer: Medicare Other | Admitting: Psychiatry

## 2023-04-06 ENCOUNTER — Encounter: Payer: Self-pay | Admitting: Psychiatry

## 2023-04-06 VITALS — BP 118/78 | HR 58 | Temp 98.3°F | Ht 68.0 in | Wt 176.6 lb

## 2023-04-06 DIAGNOSIS — R5383 Other fatigue: Secondary | ICD-10-CM

## 2023-04-06 DIAGNOSIS — F411 Generalized anxiety disorder: Secondary | ICD-10-CM

## 2023-04-06 DIAGNOSIS — F331 Major depressive disorder, recurrent, moderate: Secondary | ICD-10-CM

## 2023-04-06 DIAGNOSIS — G4733 Obstructive sleep apnea (adult) (pediatric): Secondary | ICD-10-CM | POA: Diagnosis not present

## 2023-04-06 MED ORDER — QUETIAPINE FUMARATE 25 MG PO TABS: 25.0000 mg | ORAL_TABLET | Freq: Every day | ORAL | 1 refills | Status: DC

## 2023-04-06 NOTE — Telephone Encounter (Signed)
had to leave message requesting more information and a return call back.

## 2023-04-06 NOTE — Telephone Encounter (Signed)
i have left a message to see if i can get more information.

## 2023-04-06 NOTE — Patient Instructions (Signed)
Continue  sertraline 150 mg at night  Continue bupropion 450 mg daily Continue Buspar 7.5 mg twice a day  Start quetiapine 25 mg at night  Continue clonazepam 0.5-1 mg twice a day as needed for anxiety Next appointment- 12/17 at 10 30

## 2023-04-07 ENCOUNTER — Telehealth: Payer: Self-pay | Admitting: Psychiatry

## 2023-04-07 NOTE — Telephone Encounter (Signed)
Could you provide that information to them based on the data vailable on Epic? Thanks

## 2023-04-07 NOTE — Telephone Encounter (Signed)
The pharmacy told the patient that the insurance is requesting a prior auth for the  QUEtiapine (SEROQUEL) 25 MG tablet  Pharmacy:   [id number], [place] - [address] AT [facility] OF [address] (Ph: [phone])   Insurance: BCBS Medicare Member ID: [id number] Plan ([id number]) Group No. [id number] Effective 11/09/22 RX BIN: [id number] RX PCN: [id number] RX Group: [id number]

## 2023-04-07 NOTE — Telephone Encounter (Signed)
tried twice and each time it comes back as pt not found.

## 2023-04-07 NOTE — Telephone Encounter (Signed)
Kennon Rounds from [facility] left a message that they needed the dates of appt from when you 1st seen him to most recent dates for this year. she states that they need it to do a prior auth to get him approved for treatment

## 2023-04-07 NOTE — Telephone Encounter (Signed)
notified pharmacy

## 2023-04-13 ENCOUNTER — Ambulatory Visit: Payer: Medicare Other | Admitting: Internal Medicine

## 2023-04-14 ENCOUNTER — Telehealth: Payer: Self-pay

## 2023-04-14 NOTE — Telephone Encounter (Signed)
pt wife called states that she needs to know what the buspar dosage is. she states that the avs that was given had the 5mg  and the 7.5mg . she states she also went on his mychart and the 5mg  is still on there. so she wants to confirm the dosage of the buspar.  She also states that the he is doing better on the quetiapine she states that he is sleeping better. The only concern she has is his weight. She states that if you look at his weight when he 1st started coming to you to now he has lost a lot of weight.

## 2023-04-14 NOTE — Telephone Encounter (Signed)
instructions were given per dr. Vanetta Shawl orders.

## 2023-04-14 NOTE — Telephone Encounter (Signed)
The Buspar dosage is 7.5 mg twice daily, as we previously discussed and documented in the instruction sheet. The order sent to the pharmacy in the system also reflects this dosage. It's possible she may have received medication from an earlier prescription.  I'm glad to hear that the quetiapine is working well for him. While this medication can sometimes support weight gain, I would recommend that she discuss any concerns about weight loss with his primary care provider to rule out any underlying physical causes.

## 2023-04-24 NOTE — Progress Notes (Unsigned)
BH MD/PA/NP OP Progress Note  04/27/2023 12:51 PM Ricky [name]  MRN:  [id number]  Chief Complaint:  Chief Complaint  Patient presents with   Follow-up   HPI:  This is a follow-up appointment for depression, anxiety and insomnia.  He states that he is not doing too good.  Although he thinks he is not as anxious, and quetiapine has been helping with mind racing, he has significant anhedonia.  He has occasional insomnia, and sleeps up to 12 hours.  He denies any drowsiness from clonazepam or quetiapine.  He is concerned about weight loss.  He is he has lost 30 pounds over the past few years (this is not consistent with the weight in Epic).  He has fair appetite, and eats up to twice a day.  He feels depressed.  He denies SI.  He denies hallucinations or paranoia.  He takes clonazepam every day, twice a day for anxiety.  He is willing to try higher dose of quetiapine at this time.   Ricky [name], his wife presents to the visit.  She states that there was a time [name] got "ugly," being upset about TMS process.  Although she thought he was willing to pursue TMS for next year, it was not the case.  She denies any violence.     Wt Readings from Last 3 Encounters:  04/27/23 174 lb 3.2 oz (79 kg)  04/06/23 176 lb 9.6 oz (80.1 kg)  02/25/23 184 lb 6.4 oz (83.6 kg)    08/13/22 186 lb 6.4 oz (84.6 kg)   02/09/22 190 lb (86.2 kg)  12/22/21 187 lb 3.2 oz (84.9 kg)  11/24/21 189 lb 9.6 oz (86 kg)    11/25/20 196 lb 6.4 oz (89.1 kg)  10/15/20 190 lb (86.2 kg)    Substance use   Tobacco Alcohol Other substances/  Current   denies Denies   Past   6-12 pack a day, last in 2019 denies  Past Treatment          Daily routine: sit in the chair Exercise: none Employment: unemployed since Dec 2021. Quit due to pain, used to work as a Lawyer since age 41 Support: [name]/fiance, older son, his parents/age 74's, who live next door Household: fiance Marital status: married (divorced  twice) Number of children: 3 children, age 75-41 Education: 12 th grade He was born and grew up in [place]. He has good relationship with his parents as a child, although he suffers from depression and anxiety "since born."  Visit Diagnosis:    ICD-10-CM   1. MDD (major depressive disorder), recurrent episode, moderate (HCC)  F33.1     2. Anxiety state  F41.1       Past Psychiatric History: Please see initial evaluation for full details. I have reviewed the history. No updates at this time.     Past Medical History:  Past Medical History:  Diagnosis Date   Anxiety    Arthritis    Carpal tunnel syndrome    Depression    Diabetes (HCC)    Hyperlipidemia    Hypertension    Loss of teeth due to extraction    Parkinson's disease [facility])     Past Surgical History:  Procedure Laterality Date   CARPAL TUNNEL RELEASE Right 11/28/2020   Procedure: CARPAL TUNNEL RELEASE ENDOSCOPIC;  Surgeon: Christena Flake, MD;  Location: [facility] ORS;  Service: Orthopedics;  Laterality: Right;   CARPAL TUNNEL RELEASE Left 01/22/2021   Procedure: CARPAL TUNNEL RELEASE  ENDOSCOPIC;  Surgeon: Christena Flake, MD;  Location: [facility] ORS;  Service: Orthopedics;  Laterality: Left;   KNEE ARTHROSCOPY Right     Family Psychiatric History: Please see initial evaluation for full details. I have reviewed the history. No updates at this time.     Family History:  Family History  Problem Relation Age of Onset   Diabetes Mother    Hypertension Mother    CVA Father    Heart attack Father    Depression Sister    Depression Brother     Social History:  Social History   Socioeconomic History   Marital status: Single    Spouse name: [name]   Number of children: Not on file   Years of education: Not on file   Highest education level: Not on file  Occupational History   Not on file  Tobacco Use   Smoking status: Never   Smokeless tobacco: Never  Vaping Use   Vaping status: Never Used  Substance and  Sexual Activity   Alcohol use: Never   Drug use: Never   Sexual activity: Not Currently  Other Topics Concern   Not on file  Social History Narrative   Lives with friend   Social Drivers of Corporate investment banker Strain: Not on file  Food Insecurity: Not on file  Transportation Needs: Not on file  Physical Activity: Not on file  Stress: Not on file  Social Connections: Not on file    Allergies:  Allergies  Allergen Reactions   Mirtazapine Rash    Metabolic Disorder Labs: Lab Results  Component Value Date   HGBA1C 5.2 08/25/2021   MPG 102.54 08/25/2021   No results found for: "PROLACTIN" No results found for: "CHOL", "TRIG", "HDL", "CHOLHDL", "VLDL", "LDLCALC" Lab Results  Component Value Date   TSH 2.715 08/13/2022   TSH 3.886 08/25/2021    Therapeutic Level Labs: No results found for: "LITHIUM" No results found for: "VALPROATE" No results found for: "CBMZ"  Current Medications: Current Outpatient Medications  Medication Sig Dispense Refill   atenolol (TENORMIN) 50 MG tablet Take 50 mg by mouth daily.     busPIRone (BUSPAR) 5 MG tablet Take by mouth.     busPIRone (BUSPAR) 7.5 MG tablet Take 1 tablet (7.5 mg total) by mouth 2 (two) times daily. 60 tablet 3   carbidopa-levodopa (SINEMET IR) 25-100 MG tablet Take 1 tablet by mouth 5 (five) times daily.     enalapril (VASOTEC) 10 MG tablet Take 10 mg by mouth daily.     ketoconazole (NIZORAL) 2 % shampoo Apply 1 application topically every other day.     metFORMIN (GLUCOPHAGE) 500 MG tablet Take 500 mg by mouth 2 (two) times daily with a meal.     QUEtiapine (SEROQUEL) 50 MG tablet Take 1 tablet (50 mg total) by mouth at bedtime. 30 tablet 1   rosuvastatin (CRESTOR) 20 MG tablet Take 20 mg by mouth daily.     sildenafil (VIAGRA) 50 MG tablet Take 50 mg by mouth daily as needed for erectile dysfunction.     VICTOZA 18 MG/3ML SOPN Inject 1.2 mg into the skin daily.     [START ON 05/27/2023] buPROPion  (WELLBUTRIN XL) 150 MG 24 hr tablet Take 1 tablet (150 mg total) by mouth daily. Total of 450 mg daily. Take along with 300 mg tab 30 tablet 5   [START ON 05/27/2023] buPROPion (WELLBUTRIN XL) 300 MG 24 hr tablet Take 1 tablet (300 mg total) by mouth  daily. Total of 450 mg daily. Take along with 150 mg tab 30 tablet 5   [START ON 05/04/2023] clonazePAM (KLONOPIN) 1 MG tablet Take 1 tablet (1 mg total) by mouth 2 (two) times daily as needed for anxiety. 60 tablet 1   [START ON 05/07/2023] sertraline (ZOLOFT) 100 MG tablet Take 1.5 tablets (150 mg total) by mouth at bedtime. 45 tablet 3   No current facility-administered medications for this visit.     Musculoskeletal: Strength & Muscle Tone: within normal limits Gait & Station: normal Patient leans: N/A  Psychiatric Specialty Exam: Review of Systems  Psychiatric/Behavioral:  Positive for dysphoric mood and sleep disturbance. Negative for agitation, behavioral problems, confusion, decreased concentration, hallucinations, self-injury and suicidal ideas. The patient is nervous/anxious. The patient is not hyperactive.   All other systems reviewed and are negative.   Blood pressure 114/74, pulse (!) 57, temperature (!) 97.5 F (36.4 C), temperature source Skin, height 5\' 8"  (1.727 m), weight 174 lb 3.2 oz (79 kg).Body mass index is 26.49 kg/m.  General Appearance: Well Groomed  Eye Contact:  Good  Speech:  Clear and Coherent  Volume:  Normal  Mood:  Anxious  Affect:  Appropriate, Congruent, and less restricted  Thought Process:  Coherent  Orientation:  Full (Time, Place, and Person)  Thought Content: Logical   Suicidal Thoughts:  No  Homicidal Thoughts:  No  Memory:  Immediate;   Good  Judgement:  Good  Insight:  Good  Psychomotor Activity:  Normal, slight increase in tonus, no rigidity, occasional resting tremors on left arm. No TD  Concentration:  Concentration: Good and Attention Span: Good  Recall:  Good  Fund of Knowledge: Good   Language: Good  Akathisia:  No  Handed:  Right  AIMS (if indicated): not done  Assets:  Communication Skills Desire for Improvement  ADL's:  Intact  Cognition: WNL  Sleep:   hypersomnia   Screenings: ECT-MADRS    Flowsheet Row ECT Treatment from 07/28/2021 in [facility]  MADRS Total Score 41      GAD-7    Flowsheet Row Office Visit from 02/25/2023 in [facility] Office Visit from 01/07/2023 in [facility] Office Visit from 10/12/2022 in [facility] Office Visit from 06/16/2022 in [facility] Office Visit from 03/31/2022 in [facility]  Total GAD-7 Score 18 18 12 20 21       Mini-Mental    Flowsheet Row ECT Treatment from 07/28/2021 in [facility]  Total Score (max 30 points ) 30      PHQ2-9    Flowsheet Row Office Visit from 02/25/2023 in [facility] Office Visit from 01/07/2023 in [facility] Office Visit from 10/12/2022 in [facility] Office Visit from 06/16/2022 in [facility] Office Visit from 03/31/2022 in [facility]  PHQ-2 Total Score 5 6 4 6 6   PHQ-9 Total Score 17 22 11 18 19       Flowsheet Row Office Visit from 03/31/2022 in [facility] Office Visit from 02/09/2022 in [facility] Office Visit from 12/22/2021 in [facility]  C-SSRS RISK CATEGORY No Risk No Risk No Risk        Assessment and  Plan:  Choua Reisman is a 67 y.o. year old male with a history of depression, anxiety,  parkinsonism on levodopa, diabetes, hypertension, hyperlipidemia, carpal tunnel syndrome, s/p knee arthroscopy , who presents for follow up appointment for below.   1. MDD (major depressive disorder), recurrent episode, moderate (HCC) 2. Anxiety state Acute stressors include: loss of his father with liver cancer Feb 2024 Other stressors include: unemployment   History: depression since 1979, ECT in 2023 with limited benefit, originally on duloxetine 60 mg daily, bupropion 150 mg daily Abilify 30 mg daily    Exam is notable for less irritability, and less masked face, more alertness on today's evaluation.  Although the patient reports improvement in anxiety and racing thoughts since starting quetiapine, he continues to experience depressive symptoms.  We uptitrate quetiapine to optimize treatment for depression while monitoring any adverse reaction including Parkinsonian symptoms.  Although he was referred to TMS, she would like to hold this at this time as he does not think he will be able to commit to it.  Will continue to discuss.  Will continue sertraline and bupropion to target depression, along with BuSpar for anxiety.  Clonazepam as needed for anxiety    # benzodiazepine dependence (prescribed) - tapered down 1 mg BID since April 2023 He demonstrates less rumination on requesting higher dose of clonazepam on today's visit.  Will continue current dose to target anxiety at this time, with the hope to taper it off in the future to mitigate risk of fall, oversedation especially given he has Parkinson.    3. Fatigue, unspecified type 4. Obstructive sleep apnea - According to the recent sleep study, he has severe obstructive sleep apnea, AHI of 31.  He will have a visit regarding his family test. It is noted that, although tapering off clonazepam is preferable, he experiences significant subjective worsening of anxiety when attempting to reduce the dosage. We will continue to work on this gradually  in the future.    5. Cognitive deficits Functional Status   IADL: Independent in the following:driving (advised to refrain from driving 0/1027)           Requires assistance with the following:  managing finances, medications (using pill box, his wife checks) ADL  Independent in the following: bathing and hygiene, feeding, continence, grooming and toileting, walking          Requires assistance with the following: Folate, Vitamin B12, TSH- low folic acid 08/2022. Otherwise wnl Images Brain MRI 08/2021: There is no evidence of an acute infarct, intracranial hemorrhage, mass, midline shift, or extra-axial fluid collection.Small T2 hyperintensities in the cerebral white matter bilaterally are nonspecific but compatible with mild chronic small vessel ischemic disease. Dilated perivascular spaces are noted in the basal ganglia bilaterally. There is mild generalized cerebral atrophy. Neuropsych assessment:  Etiology: parkinson, r/o VaD   He denies any subjective symptoms of memory loss.  However, his IADL is somewhat limited. Will plan to do further evaluation in the future visit.    # Alcohol use disorder in sustained remission Unchanged. He denies any alcohol use/craving for alcohol.  Will continue motivational interview.    # vitamin D deficiency # low folic acid It is not clear whether he has been followed by his primary care for these issues.  Will discuss with him at the next visit.    Plan  Continue  sertraline 150 mg at night  Continue bupropion 450 mg daily Continue Buspar 7.5 mg twice a day (restless leg from 10  mg BID) Increase quetiapine 50 mg at night (EKG 426 msec, 08/2021) (prescribed as 25 mg twice a day based on patient request) Continue clonazepam 0.5-1 mg twice a day as needed for anxiety Next appointment- 2/4 at 10 AM, IP Referral for TMS (they would like his significant other, [name] to be called, [phone] [facility]) )   They are advised to contact the sleep clinic  regarding the result - TSH- 4.238 10/2022   Past trials of medication (the following medication caused either side effect, or limited benefit): lexapro, duloxetine, venlafaxine, mirtazapine (rash), Abilify, quetiapine    The patient demonstrates the following risk factors for suicide: Chronic risk factors for suicide include: psychiatric disorder of depression and chronic pain. Acute risk factors for suicide include: unemployment. Protective factors for this patient include: positive social support, coping skills and hope for the future. Considering these factors, the overall suicide risk at this point appears to be low. Patient is appropriate for outpatient follow up.      Collaboration of Care: Collaboration of Care: review notes in Epic  Patient/Guardian was advised Release of Information must be obtained prior to any record release in order to collaborate their care with an outside provider. Patient/Guardian was advised if they have not already done so to contact the registration department to sign all necessary forms in order for [place] to release information regarding their care.   Consent: Patient/Guardian gives verbal consent for treatment and assignment of benefits for services provided during this visit. Patient/Guardian expressed understanding and agreed to proceed.    Neysa Hotter, MD 04/27/2023, 12:51 PM

## 2023-04-27 ENCOUNTER — Telehealth: Payer: Self-pay

## 2023-04-27 ENCOUNTER — Ambulatory Visit (INDEPENDENT_AMBULATORY_CARE_PROVIDER_SITE_OTHER): Payer: Medicare Other | Admitting: Psychiatry

## 2023-04-27 ENCOUNTER — Other Ambulatory Visit: Payer: Self-pay | Admitting: Psychiatry

## 2023-04-27 ENCOUNTER — Encounter: Payer: Self-pay | Admitting: Psychiatry

## 2023-04-27 VITALS — BP 114/74 | HR 57 | Temp 97.5°F | Ht 68.0 in | Wt 174.2 lb

## 2023-04-27 DIAGNOSIS — F411 Generalized anxiety disorder: Secondary | ICD-10-CM | POA: Diagnosis not present

## 2023-04-27 DIAGNOSIS — F331 Major depressive disorder, recurrent, moderate: Secondary | ICD-10-CM

## 2023-04-27 MED ORDER — BUPROPION HCL ER (XL) 150 MG PO TB24: 150.0000 mg | ORAL_TABLET | Freq: Every day | ORAL | 5 refills | Status: DC

## 2023-04-27 MED ORDER — CLONAZEPAM 1 MG PO TABS: 1.0000 mg | ORAL_TABLET | Freq: Two times a day (BID) | ORAL | 1 refills | Status: DC | PRN

## 2023-04-27 MED ORDER — BUPROPION HCL ER (XL) 300 MG PO TB24: 300.0000 mg | ORAL_TABLET | Freq: Every day | ORAL | 5 refills | Status: DC

## 2023-04-27 MED ORDER — QUETIAPINE FUMARATE 25 MG PO TABS: 25.0000 mg | ORAL_TABLET | Freq: Two times a day (BID) | ORAL | 1 refills | Status: DC

## 2023-04-27 MED ORDER — QUETIAPINE FUMARATE 50 MG PO TABS: 50.0000 mg | ORAL_TABLET | Freq: Every day | ORAL | 1 refills | Status: DC

## 2023-04-27 MED ORDER — SERTRALINE HCL 100 MG PO TABS: 150.0000 mg | ORAL_TABLET | Freq: Every day | ORAL | 3 refills | Status: DC

## 2023-04-27 NOTE — Telephone Encounter (Signed)
Patients friend donna called stating that the patients Quetiapine was increased today from 25 mg to 50 mg she stated that the pharmacy is saying he needs a PA for the 50 mg nothing has been received for a PA checked faxes and CoverMyMeds and nothing there showing that he is needing a PA called pharmacy spoke to pharmacy tech Darl Pikes she stated that the patient was given 5 tablets of the 50 mg she wasn't sure why she connected me to the dispensing side of the pharmacy spoke to [place] he stated that the patient used a discount card to purchase the 5 50 mg tablets he tried running the medication again and he stated that it does need a PA and he would get that faxed over to the office. Patient is requesting that the provider write the Rx for the 25 mg twice a day please advise.

## 2023-04-27 NOTE — Patient Instructions (Signed)
Continue  sertraline 150 mg at night  Continue bupropion 450 mg daily Continue Buspar 7.5 mg twice a day Increase quetiapine 50 mg at night  Continue clonazepam 0.5-1 mg twice a day as needed for anxiety Next appointment- 2/4 at 10 AM

## 2023-04-27 NOTE — Addendum Note (Signed)
Addended by: Neysa Hotter on: 04/27/2023 05:03 PM   Modules accepted: Orders

## 2023-04-27 NOTE — Telephone Encounter (Signed)
Called to make aware of the medication being sent he stated that even when he was on the quetiapine 25 mg they were paying out of pocket $34 for a 30 day supply

## 2023-04-27 NOTE — Telephone Encounter (Signed)
Ordered

## 2023-05-01 IMAGING — CR DG LUMBAR SPINE 2-3V
1 series · 3 of 3 positions shown · non-contrast
Comparison: None

CLINICAL DATA: Degenerative disc disease changes lumbar spine

EXAM:
LUMBAR SPINE - 2-3 VIEW

[Series 1: dg lumbar spine 2-3 views · 0.14mm/px · 3 of 3 slices shown]
[im 1/3]
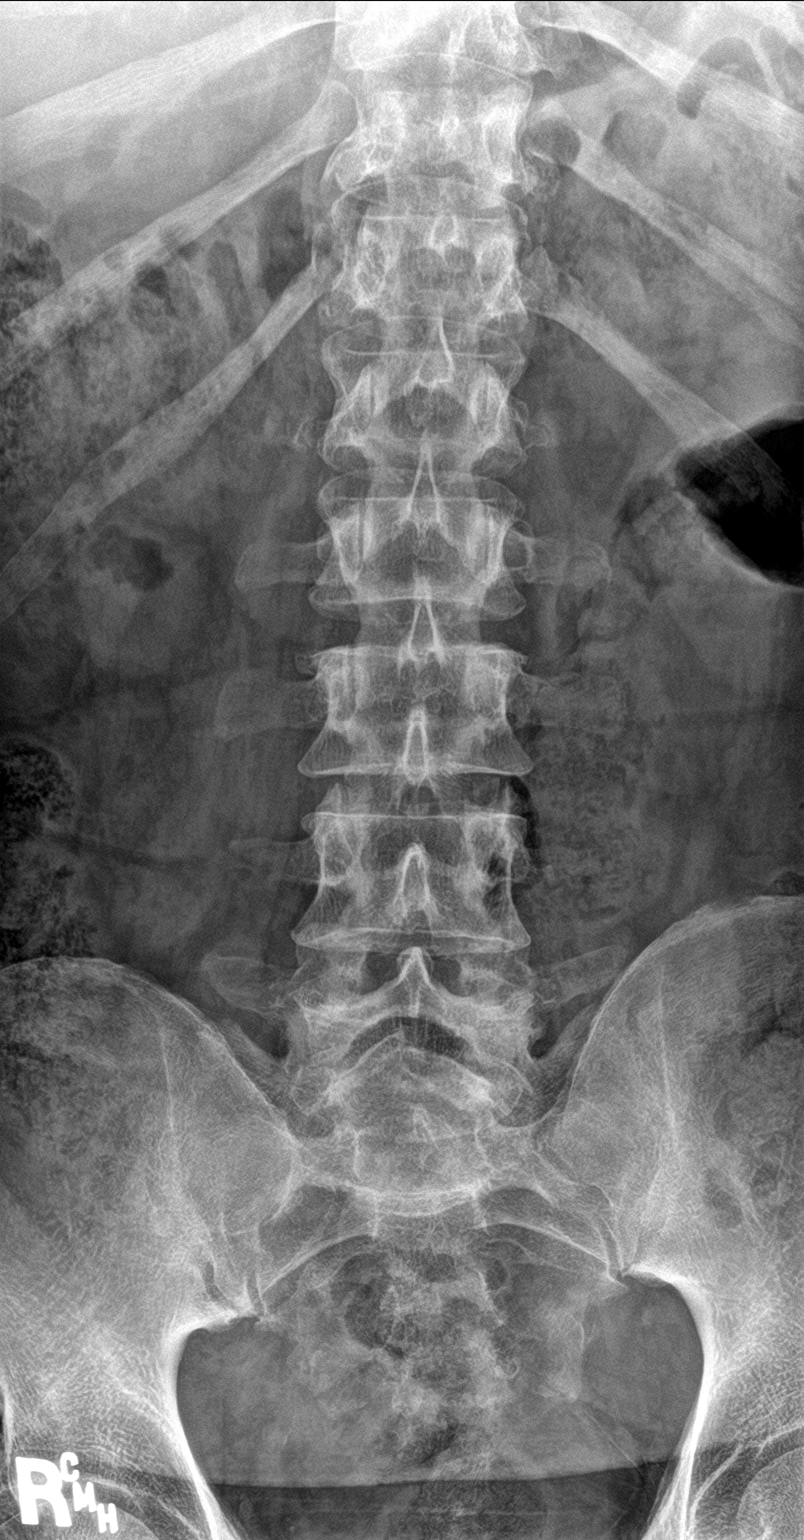
[im 2/3]
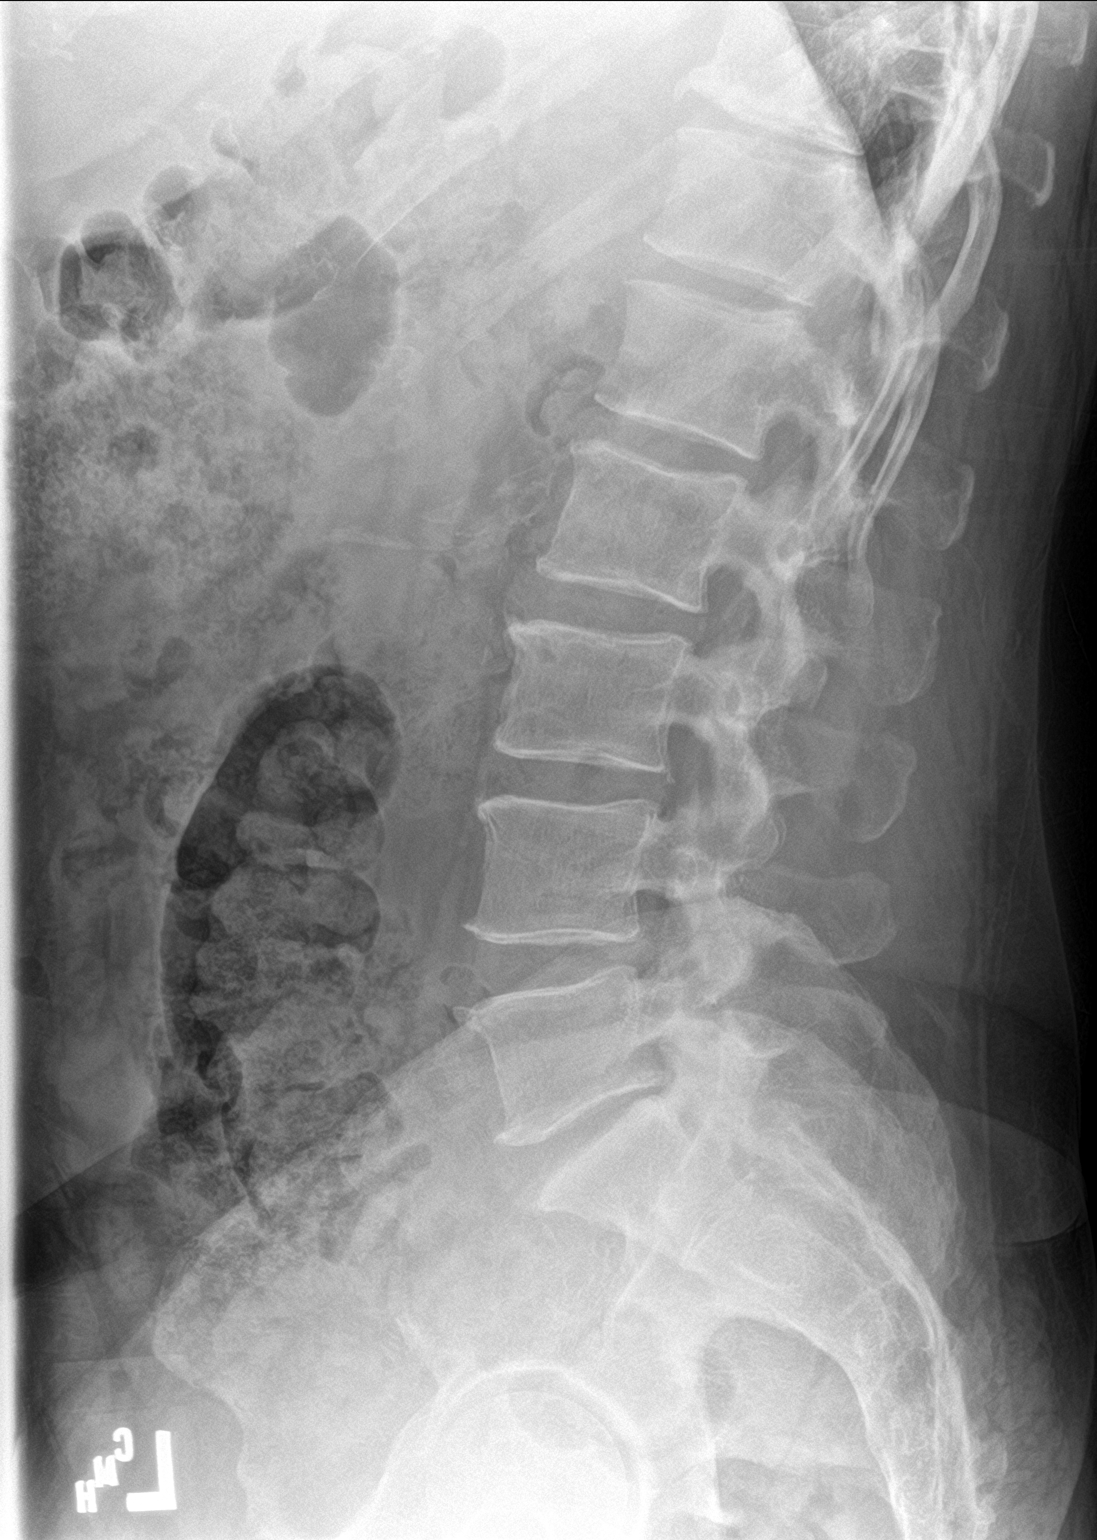
[im 3/3]
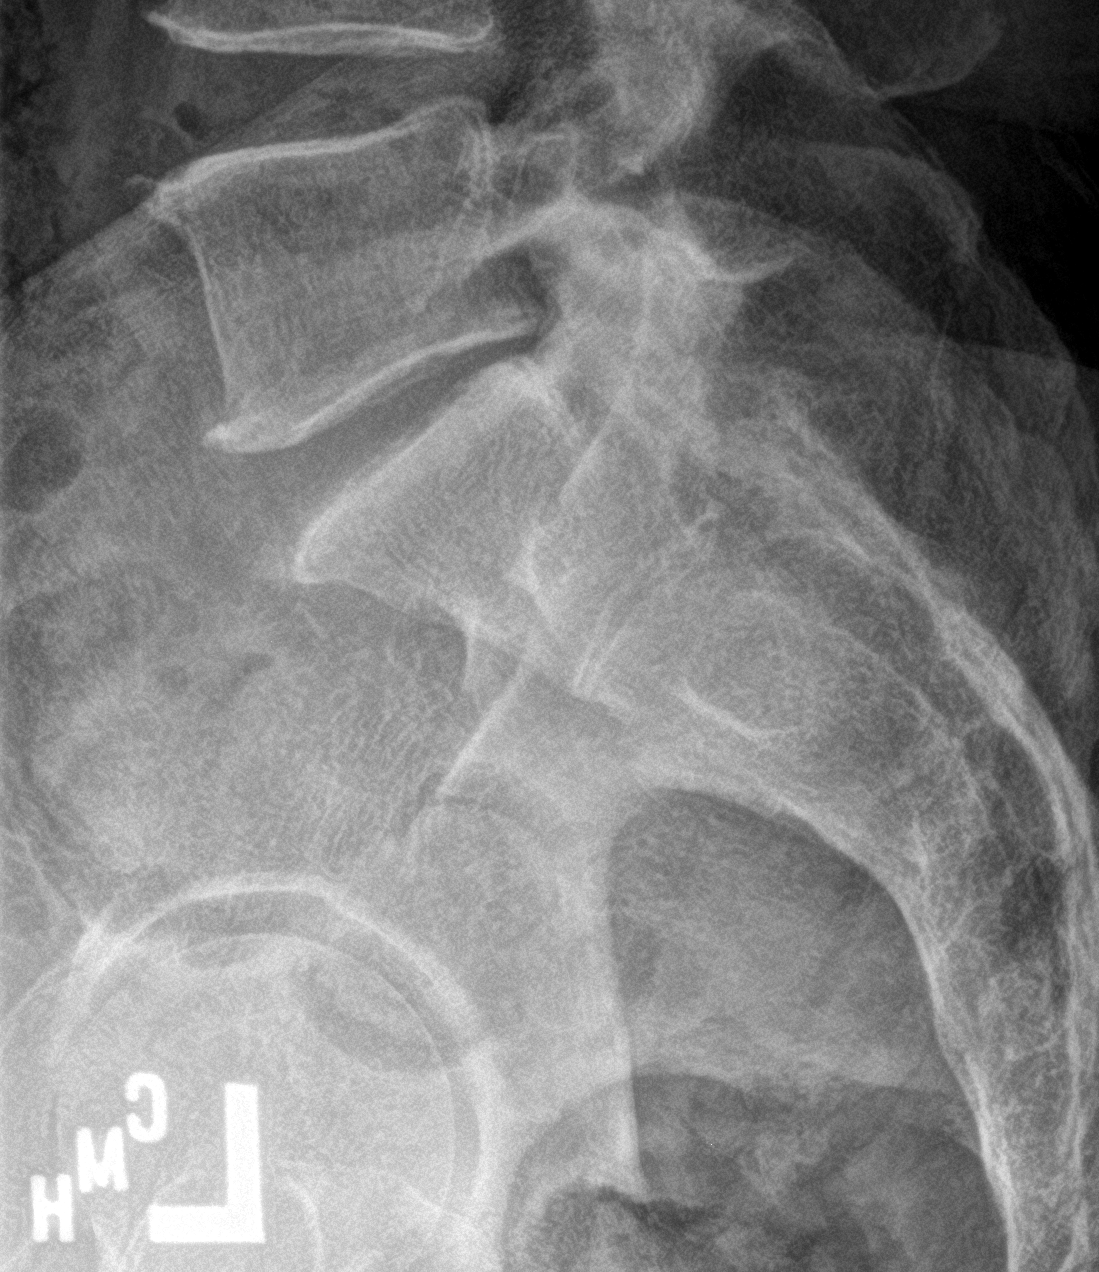

[3 of 3 positions shown; findings below may reference images not displayed]

FINDINGS: Mild osseous demineralization.

Five non-rib-bearing lumbar vertebra.

Slight disc space narrowing L5-S1 with minimal retrolisthesis.

Scattered tiny endplate spurs.

Vertebral body heights maintained without fracture or additional
subluxation.

No bone destruction or gross spondylolysis.

SI joints symmetric.
IMPRESSION: Mild degenerative disc disease changes, greatest at L5-S1 with
minimal retrolisthesis.

## 2023-05-03 ENCOUNTER — Telehealth: Payer: Self-pay

## 2023-05-03 NOTE — Telephone Encounter (Signed)
Prior Auth initiated for the patients Quetiapine 50 mg tablets 30 for 30 days sent to [facility] for determination

## 2023-05-04 ENCOUNTER — Telehealth: Payer: Self-pay

## 2023-05-04 NOTE — Telephone Encounter (Signed)
Prior auth approved for the patients Quetiapine 50 mg tablets  Coverage [id number]-----[id number] Patient made aware

## 2023-05-18 ENCOUNTER — Telehealth: Payer: Self-pay | Admitting: Psychiatry

## 2023-05-18 DIAGNOSIS — F1021 Alcohol dependence, in remission: Secondary | ICD-10-CM | POA: Insufficient documentation

## 2023-05-18 NOTE — Telephone Encounter (Signed)
 I believe he was doing well without side effects when he was on quetiapine  25 mg at night. Could you advise him to resume that dose for now until the next visit? Please note that I have not discontinued clonazepam  and have not made any changes to this medication.

## 2023-05-18 NOTE — Telephone Encounter (Signed)
 Patient is not doing well per [name] who came by the office. States he is not willing to got to the TMS therapy unless you continue him on the colonopin. He has to have that in order to be out in public she states. He has stopped the 50 mg quetiapine . And is still not sleeping Please call to advise

## 2023-05-18 NOTE — Telephone Encounter (Signed)
 Left message to call office back

## 2023-05-19 NOTE — Progress Notes (Signed)
 [facility] MD/PA/NP OP Progress Note  05/20/2023 5:35 PM Ricky [name]  MRN:  [id number]  Chief Complaint:  Chief Complaint  Patient presents with   Follow-up   HPI:  This is a follow-up appointment for depression, anxiety and fatigue.  The appointment was made sooner due to concern of worsening in insomnia while on quetiapine .   Ricky [name], his wife presents to the visit, and provides majority of the history.   He states that he discontinued quetiapine  2 weeks ago after trying for a week.  He had worsening in restless leg and insomnia.  His sleep has been fair, sleeping up to a few hours since discontinuation of this medication.  He has no interest in doing things, and feels frustrated.  There has been no change in the past several months.  He does not think he can do TMS as he feels anxious.  He agrees that tremors has been improving lately.  He denies any fall.  He denies hallucinations.  He denies SI. He is not interested in therapy at this time, and would like to try adjustment of medication first.   Ricky [name] thinks that he has ADHD.  She works for children with ADHD, and she has a problem with ADHD.  Gerlene is unable to focus as he has racing thoughts.  She states that he needs to try something else.  She asks clonazepam  to be up titrated to 3 times a day (Phil states that he would be open to other medication adjustment).  She states that he tends to get irritable when she talks about TMS.  She does not think he can do this due to his ongoing anxiety.  She states that he would never imagine what it like when he experiences the anxiety, while clonazepam  is helpful.    Wt Readings from Last 3 Encounters:  05/20/23 174 lb 3.2 oz (79 kg)  04/27/23 174 lb 3.2 oz (79 kg)  04/06/23 176 lb 9.6 oz (80.1 kg)     Visit Diagnosis:    ICD-10-CM   1. MDD (major depressive disorder), recurrent episode, moderate (HCC)  F33.1     2. Anxiety state  F41.1       Past Psychiatric History: Please see initial  evaluation for full details. I have reviewed the history. No updates at this time.     Past Medical History:  Past Medical History:  Diagnosis Date   Anxiety    Arthritis    Carpal tunnel syndrome    Depression    Diabetes (HCC)    Hyperlipidemia    Hypertension    Loss of teeth due to extraction    Parkinson's disease [facility])     Past Surgical History:  Procedure Laterality Date   CARPAL TUNNEL RELEASE Right 11/28/2020   Procedure: CARPAL TUNNEL RELEASE ENDOSCOPIC;  Surgeon: Edie Norleen PARAS, MD;  Location: [facility] ORS;  Service: Orthopedics;  Laterality: Right;   CARPAL TUNNEL RELEASE Left 01/22/2021   Procedure: CARPAL TUNNEL RELEASE ENDOSCOPIC;  Surgeon: Edie Norleen PARAS, MD;  Location: [facility] ORS;  Service: Orthopedics;  Laterality: Left;   KNEE ARTHROSCOPY Right     Family Psychiatric History: Please see initial evaluation for full details. I have reviewed the history. No updates at this time.     Family History:  Family History  Problem Relation Age of Onset   Diabetes Mother    Hypertension Mother    CVA Father    Heart attack Father    Depression Sister  Depression Brother     Social History:  Social History   Socioeconomic History   Marital status: Single    Spouse name: Ricky [name]   Number of children: Not on file   Years of education: Not on file   Highest education level: Not on file  Occupational History   Not on file  Tobacco Use   Smoking status: Never   Smokeless tobacco: Never  Vaping Use   Vaping status: Never Used  Substance and Sexual Activity   Alcohol use: Never   Drug use: Never   Sexual activity: Not Currently  Other Topics Concern   Not on file  Social History Narrative   Lives with friend   Social Drivers of Corporate Investment Banker Strain: Not on file  Food Insecurity: Not on file  Transportation Needs: Not on file  Physical Activity: Not on file  Stress: Not on file  Social Connections: Not on file    Allergies:  Allergies   Allergen Reactions   Mirtazapine Rash    Metabolic Disorder Labs: Lab Results  Component Value Date   HGBA1C 5.2 08/25/2021   MPG 102.54 08/25/2021   No results found for: PROLACTIN No results found for: CHOL, TRIG, HDL, CHOLHDL, VLDL, LDLCALC Lab Results  Component Value Date   TSH 2.715 08/13/2022   TSH 3.886 08/25/2021    Therapeutic Level Labs: No results found for: LITHIUM No results found for: VALPROATE No results found for: CBMZ  Current Medications: Current Outpatient Medications  Medication Sig Dispense Refill   atenolol (TENORMIN) 50 MG tablet Take 50 mg by mouth daily.     [START ON 05/27/2023] buPROPion  (WELLBUTRIN  XL) 150 MG 24 hr tablet Take 1 tablet (150 mg total) by mouth daily. Total of 450 mg daily. Take along with 300 mg tab 30 tablet 5   [START ON 05/27/2023] buPROPion  (WELLBUTRIN  XL) 300 MG 24 hr tablet Take 1 tablet (300 mg total) by mouth daily. Total of 450 mg daily. Take along with 150 mg tab 30 tablet 5   busPIRone  (BUSPAR ) 7.5 MG tablet Take 1 tablet (7.5 mg total) by mouth 2 (two) times daily. 60 tablet 3   carbidopa -levodopa  (SINEMET  IR) 25-100 MG tablet Take 1 tablet by mouth 5 (five) times daily.     clonazePAM  (KLONOPIN ) 1 MG tablet Take 1 tablet (1 mg total) by mouth 2 (two) times daily as needed for anxiety. 60 tablet 1   ketoconazole (NIZORAL) 2 % shampoo Apply 1 application topically every other day.     metFORMIN  (GLUCOPHAGE ) 500 MG tablet Take 500 mg by mouth 2 (two) times daily with a meal.     methylphenidate  (RITALIN ) 5 MG tablet Take 1 tablet (5 mg total) by mouth daily. 30 tablet 0   rosuvastatin  (CRESTOR ) 20 MG tablet Take 20 mg by mouth daily.     sertraline  (ZOLOFT ) 100 MG tablet Take 1.5 tablets (150 mg total) by mouth at bedtime. 45 tablet 3   sildenafil (VIAGRA) 50 MG tablet Take 50 mg by mouth daily as needed for erectile dysfunction.     VICTOZA 18 MG/3ML SOPN Inject 1.2 mg into the skin daily.     No  current facility-administered medications for this visit.     Musculoskeletal: Strength & Muscle Tone: within normal limits Gait & Station: normal Patient leans: N/A  Psychiatric Specialty Exam: Review of Systems  Psychiatric/Behavioral:  Positive for dysphoric mood and sleep disturbance. Negative for agitation, behavioral problems, confusion, decreased concentration, hallucinations, self-injury and  suicidal ideas. The patient is nervous/anxious. The patient is not hyperactive.   All other systems reviewed and are negative.   Blood pressure 118/78, pulse 60, temperature (!) 97 F (36.1 C), temperature source Skin, height 5' 8 (1.727 m), weight 174 lb 3.2 oz (79 kg).Body mass index is 26.49 kg/m.  General Appearance: Well Groomed  Eye Contact:  Good  Speech:  Clear and Coherent  Volume:  Normal  Mood:   same  Affect:  Appropriate, Congruent, and less masked face  Thought Process:  Coherent  Orientation:  Full (Time, Place, and Person)  Thought Content: Logical   Suicidal Thoughts:  No  Homicidal Thoughts:  No  Memory:  Immediate;   Good  Judgement:  Good  Insight:  Good  Psychomotor Activity:  Normal  Concentration:  Concentration: Good and Attention Span: Good  Recall:  Good  Fund of Knowledge: Good  Language: Good  Akathisia:  No  Handed:  Right  AIMS (if indicated): not done  Assets:  Communication Skills Desire for Improvement  ADL's:  Intact  Cognition: WNL  Sleep:  Poor   Screenings: ECT-MADRS    Flowsheet Row ECT Treatment from 07/28/2021 in [facility]  MADRS Total Score 41      GAD-7    Flowsheet Row Office Visit from 02/25/2023 in [facility] Office Visit from 01/07/2023 in [facility] Office Visit from 10/12/2022 in [facility] Office Visit from 06/16/2022 in [facility] Office Visit from 03/31/2022 in [facility]  Total GAD-7 Score 18 18 12 20 21       Mini-Mental    Flowsheet Row ECT Treatment from 07/28/2021 in [facility]  Total Score (max 30 points ) 30      PHQ2-9    Flowsheet Row Office Visit from 02/25/2023 in [facility] Office Visit from 01/07/2023 in [facility] Office Visit from 10/12/2022 in [facility] Office Visit from 06/16/2022 in [facility] Office Visit from 03/31/2022 in [facility]  PHQ-2 Total Score 5 6 4 6 6   PHQ-9 Total Score 17 22 11 18 19       Flowsheet Row Office Visit from 03/31/2022 in [facility] Office Visit from 02/09/2022 in [facility] Office Visit from 12/22/2021 in [facility]  C-SSRS RISK CATEGORY No Risk No Risk No Risk        Assessment and Plan:  Zariah Jost is a 68 y.o. year old male with a history of depression, anxiety, parkinsonism on levodopa , diabetes, hypertension, hyperlipidemia, carpal tunnel syndrome, s/p knee arthroscopy , who presents for follow up appointment for below.    1. MDD (major depressive disorder), recurrent episode, moderate (HCC) 2. Anxiety state Acute stressors include: loss of his father with liver cancer Feb 2024 Other stressors include: unemployment   History: depression since 1979, ECT in 2023 with limited benefit, originally on duloxetine  60 mg daily, bupropion  150 mg daily Abilify  30 mg daily     He had adverse reaction of restless leg and insomnia from higher dose of quetiapine , and has no desire to stay even on lower dose, although there was improvement in anxiety and racing  thoughts.  Although he will  greatly benefit from TMS, he is not willing to pursue this due to his level of energy and anxiety.  After having discussion at length, he agrees to try methylphenidate  to target fatigue. It was discussed with both the patient and his wife that the medication is indicated for fatigue and depression, not for ADHD, as an evaluation for ADHD is challenging at this time due to his ongoing mood symptoms.  Discussed potential risk of worsening in palpitation, anxiety, insomnia especially with concomitant use of bupropion .  Will continue current dose of bupropion  as adjunctive treatment for depression.  Will continue sertraline  for depression, anxiety and BuSpar  for anxiety.  Notably, he reports improvement in his tremor, which has also been observed objectively. This improvement coincided with tapering off Abilify . The plan is to continue holding off on this  # benzodiazepine dependence (prescribed) - tapered down from 1 mg TID since April 2023 While he reports strong preference to be back on a higher dose of clonazepam , he expressed understanding not to do so due to potential risk of fall, drowsiness especially given his parkinsonian symptoms.  Will continue current dose to target anxiety at this time.     3. Fatigue, unspecified type 4. Obstructive sleep apnea - According to the recent sleep study, he has severe obstructive sleep apnea, AHI of 31.  Will start Ritalin  as described above to target fatigue.  Will follow up regarding CPAP machine given his recent sleep test.  it is noted that, although tapering off clonazepam  is preferable, he experiences significant subjective worsening of anxiety when attempting to reduce the dosage. We will continue to work on this gradually in the future.    5. Cognitive deficits Functional Status   IADL: Independent in the following:driving (advised to refrain from driving [id number])           Requires assistance with the following:  managing  finances, medications (using pill box, his wife checks) ADL  Independent in the following: bathing and hygiene, feeding, continence, grooming and toileting, walking          Requires assistance with the following: Folate, Vitamin B12, TSH- low folic acid  08/2022. Otherwise wnl Images Brain MRI 08/2021: There is no evidence of an acute infarct, intracranial hemorrhage, mass, midline shift, or extra-axial fluid collection.Small T2 hyperintensities in the cerebral white matter bilaterally are nonspecific but compatible with mild chronic small vessel ischemic disease. Dilated perivascular spaces are noted in the basal ganglia bilaterally. There is mild generalized cerebral atrophy. Neuropsych assessment:  Etiology: parkinson, r/o VaD   He denies any subjective symptoms of memory loss.  However, his IADL is somewhat limited. Will plan to do further evaluation in the future visit.    # Alcohol use disorder in sustained remission Unchanged. He denies any alcohol use/craving for alcohol.  Will continue motivational interview.    # vitamin D  deficiency # low folic acid  It is not clear whether he has been followed by his primary care for these issues.  Will discuss with him at the next visit.    Plan  Continue  sertraline  150 mg at night  Continue bupropion  450 mg daily Continue Buspar  7.5 mg twice a day (restless leg from 10 mg BID) Hold quetiapine   Start ritalin  5 mg daily  Continue clonazepam  0.5-1 mg twice a day as needed for anxiety Next appointment- 2/4 at 10 AM, IP Referral for TMS (they would like his significant other, Ricky [name] to be called, [phone] [facility]) ) They are advised to contact the sleep  clinic regarding the result - TSH- 4.238 10/2022   Past trials of medication (the following medication caused either side effect, or limited benefit): lexapro, duloxetine , venlafaxine, mirtazapine (rash), Abilify , quetiapine  (insomnia, RLS)   The patient demonstrates the following risk factors  for suicide: Chronic risk factors for suicide include: psychiatric disorder of depression and chronic pain. Acute risk factors for suicide include: unemployment. Protective factors for this patient include: positive social support, coping skills and hope for the future. Considering these factors, the overall suicide risk at this point appears to be low. Patient is appropriate for outpatient follow up.    Collaboration of Care: Collaboration of Care: Other reviewed notes in Epic  Patient/Guardian was advised Release of Information must be obtained prior to any record release in order to collaborate their care with an outside provider. Patient/Guardian was advised if they have not already done so to contact the registration department to sign all necessary forms in order for us  to release information regarding their care.   Consent: Patient/Guardian gives verbal consent for treatment and assignment of benefits for services provided during this visit. Patient/Guardian expressed understanding and agreed to proceed.   The duration of the time spent on the following activities on the date of the encounter was 40 minutes.   Preparing to see the patient (e.g., review of test, records)  Obtaining and/or reviewing separately obtained history  Performing a medically necessary exam and/or evaluation  Counseling and educating the patient/family/caregiver  Ordering medications, tests, or procedures  Referring and communicating with other healthcare professionals (when not reported separately)  Documenting clinical information in the electronic or paper health record  Independently interpreting results of tests/labs and communication of results to the family or caregiver  Care coordination (when not reported separately)    Katheren Sleet, MD 05/20/2023, 5:35 PM

## 2023-05-20 ENCOUNTER — Encounter: Payer: Self-pay | Admitting: Psychiatry

## 2023-05-20 ENCOUNTER — Ambulatory Visit (INDEPENDENT_AMBULATORY_CARE_PROVIDER_SITE_OTHER): Payer: Medicare Other | Admitting: Psychiatry

## 2023-05-20 ENCOUNTER — Telehealth: Payer: Self-pay

## 2023-05-20 VITALS — BP 118/78 | HR 60 | Temp 97.0°F | Ht 68.0 in | Wt 174.2 lb

## 2023-05-20 DIAGNOSIS — F411 Generalized anxiety disorder: Secondary | ICD-10-CM

## 2023-05-20 DIAGNOSIS — F331 Major depressive disorder, recurrent, moderate: Secondary | ICD-10-CM

## 2023-05-20 MED ORDER — METHYLPHENIDATE HCL 5 MG PO TABS: 5.0000 mg | ORAL_TABLET | Freq: Every day | ORAL | 0 refills | Status: DC

## 2023-05-20 NOTE — Patient Instructions (Signed)
 Continue  sertraline 150 mg at night  Continue bupropion 450 mg daily Continue Buspar 7.5 mg twice a day  Hold quetiapine  Start ritalin 5 mg daily  Continue clonazepam 0.5-1 mg twice a day as needed for anxiety Next appointment- 2/4 at 10 AM

## 2023-05-20 NOTE — Telephone Encounter (Signed)
 Received fax requesting a Prior Authorization for the patients Methylphenidate  5 mg tablets after submitting via CoverMyMeds received a message there was an error with your request Member not found called patient to make sure he still had [employer] or if he received a new card he stated that his wife handles all of that and he would have her to call me back number provided for patient to return the call [phone].

## 2023-05-23 ENCOUNTER — Other Ambulatory Visit: Payer: Self-pay | Admitting: Psychiatry

## 2023-05-24 ENCOUNTER — Telehealth: Payer: Self-pay

## 2023-05-24 NOTE — Telephone Encounter (Signed)
 After receiving information from patient to call  [employer] for the Prior Authorization for the Methylphenidate  5 mg tablet called spoke to T[place]ka she took all information received an approval for the medication  Coverage will end on 05-20-24 Called patient to make aware

## 2023-06-07 ENCOUNTER — Other Ambulatory Visit: Payer: Self-pay | Admitting: Otolaryngology

## 2023-06-07 DIAGNOSIS — R221 Localized swelling, mass and lump, neck: Secondary | ICD-10-CM

## 2023-06-07 DIAGNOSIS — J358 Other chronic diseases of tonsils and adenoids: Secondary | ICD-10-CM

## 2023-06-08 ENCOUNTER — Ambulatory Visit
Admission: RE | Admit: 2023-06-08 | Discharge: 2023-06-08 | Disposition: A | Discharge status: Home / Self Care | Payer: Medicare Other | Source: Ambulatory Visit | Attending: Otolaryngology | Admitting: Otolaryngology

## 2023-06-08 DIAGNOSIS — J358 Other chronic diseases of tonsils and adenoids: Secondary | ICD-10-CM

## 2023-06-08 DIAGNOSIS — R221 Localized swelling, mass and lump, neck: Secondary | ICD-10-CM

## 2023-06-08 MED ORDER — IOPAMIDOL (ISOVUE-300) INJECTION 61%
75.0000 mL | Freq: Once | INTRAVENOUS | Status: AC | PRN
  Administered 2023-06-08: 75 mL via INTRAVENOUS

## 2023-06-08 NOTE — Progress Notes (Signed)
 [facility] MD/PA/NP OP Progress Note  06/15/2023 11:03 AM Ricky [name]  MRN:  [id number]  Chief Complaint:  Chief Complaint  Patient presents with   Follow-up   HPI:  This is a follow-up appointment for depression, anxiety.  He states that he is concerned about his cancer.  He did have throat pain for several months, and was found to have a mass at the dentist.  They think it is cancer, although biopsy is scheduled for confirmation.  He may need to get radiation.  She is concerned about this.  He struggled with insomnia, sleeping 2-4 hours, thinking about bad things.  His mind is racing.  He asks why medication is discontinued (on the current dosage for almost two years), although it was working for him.  He finds it helpful to take the medication at night for sleep.  He does not think the other medication is helping.  He had restlessness, and issues with insomnia when ritalin  was started, although he is getting used to it.  He does not think it helped for racing thoughts or mood.  Although he initially expressed his hesitancy to see a therapist, he is open to schedule after being provided psychoeducation.  He agrees with the plans as outlined below.   Wt Readings from Last 3 Encounters:  06/15/23 171 lb (77.6 kg)  05/20/23 174 lb 3.2 oz (79 kg)  04/27/23 174 lb 3.2 oz (79 kg)      Visit Diagnosis:    ICD-10-CM   1. MDD (major depressive disorder), recurrent episode, moderate (HCC)  F33.1     2. Anxiety state  F41.1       Past Psychiatric History: Please see initial evaluation for full details. I have reviewed the history. No updates at this time.     Past Medical History:  Past Medical History:  Diagnosis Date   Anxiety    Arthritis    Carpal tunnel syndrome    Depression    Diabetes (HCC)    Hyperlipidemia    Hypertension    Loss of teeth due to extraction    Parkinson's disease [facility])     Past Surgical History:  Procedure Laterality Date   CARPAL TUNNEL RELEASE Right  11/28/2020   Procedure: CARPAL TUNNEL RELEASE ENDOSCOPIC;  Surgeon: Edie Norleen PARAS, MD;  Location: [facility] ORS;  Service: Orthopedics;  Laterality: Right;   CARPAL TUNNEL RELEASE Left 01/22/2021   Procedure: CARPAL TUNNEL RELEASE ENDOSCOPIC;  Surgeon: Edie Norleen PARAS, MD;  Location: [facility] ORS;  Service: Orthopedics;  Laterality: Left;   KNEE ARTHROSCOPY Right     Family Psychiatric History: Please see initial evaluation for full details. I have reviewed the history. No updates at this time.     Family History:  Family History  Problem Relation Age of Onset   Diabetes Mother    Hypertension Mother    CVA Father    Heart attack Father    Depression Sister    Depression Brother     Social History:  Social History   Socioeconomic History   Marital status: Single    Spouse name: [name]   Number of children: Not on file   Years of education: Not on file   Highest education level: Not on file  Occupational History   Not on file  Tobacco Use   Smoking status: Never   Smokeless tobacco: Never  Vaping Use   Vaping status: Never Used  Substance and Sexual Activity   Alcohol use: Never  Drug use: Never   Sexual activity: Not Currently  Other Topics Concern   Not on file  Social History Narrative   Lives with friend   Social Drivers of Health   Financial Resource Strain: Not on file  Food Insecurity: Not on file  Transportation Needs: Not on file  Physical Activity: Not on file  Stress: Not on file  Social Connections: Not on file    Allergies:  Allergies  Allergen Reactions   Mirtazapine Rash    Metabolic Disorder Labs: Lab Results  Component Value Date   HGBA1C 5.2 08/25/2021   MPG 102.54 08/25/2021   No results found for: PROLACTIN No results found for: CHOL, TRIG, HDL, CHOLHDL, VLDL, LDLCALC Lab Results  Component Value Date   TSH 2.715 08/13/2022   TSH 3.886 08/25/2021    Therapeutic Level Labs: No results found for: LITHIUM No results  found for: VALPROATE No results found for: CBMZ  Current Medications: Current Outpatient Medications  Medication Sig Dispense Refill   traZODone  (DESYREL ) 50 MG tablet Take 0.5-2 tablets (25-100 mg total) by mouth at bedtime as needed for sleep. 60 tablet 1   atenolol (TENORMIN) 50 MG tablet Take 50 mg by mouth daily.     buPROPion  (WELLBUTRIN  XL) 150 MG 24 hr tablet Take 1 tablet (150 mg total) by mouth daily. Total of 450 mg daily. Take along with 300 mg tab 30 tablet 5   buPROPion  (WELLBUTRIN  XL) 300 MG 24 hr tablet Take 1 tablet (300 mg total) by mouth daily. Total of 450 mg daily. Take along with 150 mg tab 30 tablet 5   [START ON 06/25/2023] busPIRone  (BUSPAR ) 7.5 MG tablet Take 1 tablet (7.5 mg total) by mouth 2 (two) times daily. 60 tablet 3   carbidopa -levodopa  (SINEMET  IR) 25-100 MG tablet Take 1 tablet by mouth 5 (five) times daily.     [START ON 07/03/2023] clonazePAM  (KLONOPIN ) 1 MG tablet Take 1 tablet (1 mg total) by mouth 2 (two) times daily as needed for anxiety. 60 tablet 1   ketoconazole (NIZORAL) 2 % shampoo Apply 1 application topically every other day.     metFORMIN  (GLUCOPHAGE ) 500 MG tablet Take 500 mg by mouth 2 (two) times daily with a meal.     methylphenidate  (RITALIN ) 5 MG tablet Take 1 tablet (5 mg total) by mouth daily. 30 tablet 0   rosuvastatin  (CRESTOR ) 20 MG tablet Take 20 mg by mouth daily.     sertraline  (ZOLOFT ) 100 MG tablet Take 1.5 tablets (150 mg total) by mouth at bedtime. 45 tablet 3   sildenafil (VIAGRA) 50 MG tablet Take 50 mg by mouth daily as needed for erectile dysfunction.     VICTOZA 18 MG/3ML SOPN Inject 1.2 mg into the skin daily.     No current facility-administered medications for this visit.     Musculoskeletal: Strength & Muscle Tone: within normal limits Gait & Station: normal Patient leans: N/A  Psychiatric Specialty Exam: Review of Systems  Psychiatric/Behavioral:  Positive for dysphoric mood and sleep disturbance. Negative  for agitation, behavioral problems, confusion, decreased concentration, hallucinations, self-injury and suicidal ideas. The patient is nervous/anxious. The patient is not hyperactive.   All other systems reviewed and are negative.   Blood pressure 105/73, pulse 66, weight 171 lb (77.6 kg).Body mass index is 26 kg/m.  General Appearance: Well Groomed  Eye Contact:  Good  Speech:  Clear and Coherent  Volume:  Normal  Mood:  Anxious and Depressed  Affect:  Appropriate, Congruent,  and less restricted  Thought Process:  Coherent  Orientation:  Full (Time, Place, and Person)  Thought Content: Logical   Suicidal Thoughts:  No  Homicidal Thoughts:  No  Memory:  Immediate;   Good  Judgement:  Good  Insight:  Good  Psychomotor Activity:  Normal  Concentration:  Concentration: Good and Attention Span: Good  Recall:  Good  Fund of Knowledge: Good  Language: Good  Akathisia:  No  Handed:  Right  AIMS (if indicated): not done  Assets:  Communication Skills Desire for Improvement  ADL's:  Intact  Cognition: WNL  Sleep:  Poor   Screenings: ECT-MADRS    Flowsheet Row ECT Treatment from 07/28/2021 in [facility]  MADRS Total Score 41      GAD-7    Flowsheet Row Office Visit from 02/25/2023 in [facility] Office Visit from 01/07/2023 in [facility] Office Visit from 10/12/2022 in [facility] Office Visit from 06/16/2022 in [facility] Office Visit from 03/31/2022 in [facility]  Total GAD-7 Score 18 18 12 20 21       Mini-Mental    Flowsheet Row ECT Treatment from 07/28/2021 in [facility]  Total Score (max 30 points ) 30      PHQ2-9    Flowsheet Row Office Visit from 02/25/2023 in [facility] Office Visit from 01/07/2023 in [facility] Office Visit from 10/12/2022 in [facility] Office Visit from 06/16/2022 in [facility] Office Visit from 03/31/2022 in [facility]  PHQ-2 Total Score 5 6 4 6 6   PHQ-9 Total Score 17 22 11 18 19       Flowsheet Row Office Visit from 03/31/2022 in [facility] Office Visit from 02/09/2022 in [facility] Office Visit from 12/22/2021 in [facility]  C-SSRS RISK CATEGORY No Risk No Risk No Risk        Assessment and Plan:  Brandn Mcgath is a 68 y.o. year old male with a history of depression, anxiety, parkinsonism on levodopa , diabetes, hypertension, hyperlipidemia, carpal tunnel syndrome, s/p knee arthroscopy , who presents for follow up appointment for below.    1. MDD (major depressive disorder), recurrent episode, moderate (HCC) 2. Anxiety state Acute stressors include: loss of his father with liver cancer Feb 2024, undergoing evaluation of necrotic mass at the glossotonsillar sulcus Other stressors include: unemployment   History: depression since 1979, ECT in 2023 with limited benefit, originally on duloxetine  60 mg daily, bupropion  150 mg daily Abilify  30 mg daily       There is worsening in anxiety in the context of undergoing evaluation of necrotic mass at the glossotonsillar sulcus since the last visit.  He reports adverse reaction of restlessness, insomnia from low dose of Ritalin .  Will discontinue this medication due to this adverse reaction.  Although the hope was to undergo TMS, it will be on hold while he undergoes evaluation and possible treatment of this mass.  Will continue current medication regimen for now while intervening sleep as outlined  below.  He will greatly benefit from CBT; will make a referral.  Of note, his facial expression and energy appear slightly more vital since discontinuing Abilify , although both  he and his wife deny noticing any subjective change. Will plan to hold off on initiating any antipsychotic at this time.   # benzodiazepine dependence (prescribed) - tapered down from 1 mg TID since April 2023 Unchanged. While he reports strong preference to be back on a higher dose of clonazepam , he expressed understanding not to do so due to potential risk of fall, drowsiness especially given his parkinsonian symptoms.  Will continue current dose to target anxiety.     # Insomnia 3. Fatigue, unspecified type 4. Obstructive sleep apnea # vitamin D  deficiency - According to the recent sleep study, he has severe obstructive sleep apnea, AHI of 31.  Pending sleep study.  Low-dose Ritalin  did not make any difference, but cause adverse reaction of restlessness.  Is not interested in tapering down clonazepam  given ongoing anxiety.  Will start trazodone  as needed for insomnia.  Discussed potential risk of drowsiness.  They were recommended again to replete vitamin D .     5. Cognitive deficits Functional Status   IADL: Independent in the following:driving (advised to refrain from driving [id number])           Requires assistance with the following:  managing finances, medications (using pill box, his wife checks) ADL  Independent in the following: bathing and hygiene, feeding, continence, grooming and toileting, walking          Requires assistance with the following: Folate, Vitamin B12, TSH- low folic acid  08/2022. Otherwise wnl Images Brain MRI 08/2021: There is no evidence of an acute infarct, intracranial hemorrhage, mass, midline shift, or extra-axial fluid collection.Small T2 hyperintensities in the cerebral white matter bilaterally are nonspecific but compatible with mild chronic small vessel ischemic disease. Dilated  perivascular spaces are noted in the basal ganglia bilaterally. There is mild generalized cerebral atrophy. Neuropsych assessment:  Etiology: parkinson, r/o VaD   He denies any subjective symptoms of memory loss.  However, his IADL is somewhat limited. Will plan to do further evaluation in the future visit.    Plan  Continue  sertraline  150 mg at night (limited benefit from higher dose) Continue bupropion  450 mg daily Continue Buspar  7.5 mg twice a day (restless leg from 10 mg BID) Discontinue Ritalin  Start trazodone  25-50 mg at night as needed for insomnia, then increase up to 100 mg at night if needed Continue clonazepam  0.5-1 mg twice a day as needed for anxiety Next appointment-  4/1 at 11:30, IP Referral to therapy  They are advised to contact the sleep clinic regarding the result - TSH- 4.238 10/2022   Past trials of medication (the following medication caused either side effect, or limited benefit): lexapro, duloxetine , venlafaxine, mirtazapine (rash), Abilify , quetiapine  (insomnia, RLS)   The patient demonstrates the following risk factors for suicide: Chronic risk factors for suicide include: psychiatric disorder of depression and chronic pain. Acute risk factors for suicide include: unemployment. Protective factors for this patient include: positive social support, coping skills and hope for the future. Considering these factors, the overall suicide risk at this point appears to be low. Patient is appropriate for outpatient follow up.  Collaboration of Care: Collaboration of Care: Other reviewed notes in Epic  Patient/Guardian was advised Release of Information must be obtained prior to any record release in order to collaborate their care with an outside provider. Patient/Guardian was advised if they have not already done so to contact the registration department to sign all necessary forms in order for us  to release information regarding their care.   Consent: Patient/Guardian  gives verbal consent for treatment and assignment of benefits for services provided during this visit. Patient/Guardian expressed understanding and agreed to proceed.    Katheren Sleet, MD 06/15/2023, 11:03 AM

## 2023-06-10 ENCOUNTER — Other Ambulatory Visit: Payer: Self-pay | Admitting: Otolaryngology

## 2023-06-10 DIAGNOSIS — R599 Enlarged lymph nodes, unspecified: Secondary | ICD-10-CM

## 2023-06-10 NOTE — Progress Notes (Signed)
Ricky Overlie, MD sent to [name] Discussed with Dr. Willeen Cass.  US guided core biopsy of the accessible right cervical lymph node on CT.  We are not biopsying the actual mass.  See prior neck CT.  [place]

## 2023-06-14 ENCOUNTER — Other Ambulatory Visit: Payer: Self-pay | Admitting: Radiology

## 2023-06-14 NOTE — Progress Notes (Signed)
 Patient for US  guided Core RT Cervical LN Biopsy on Tues 06/15/23, I called and spoke with the patient on the phone and gave pre-procedure instructions. Pt was made aware to be here at 12:30p and check in at the [facility] registration desk. Pt stated understanding.  Called 06/14/23

## 2023-06-15 ENCOUNTER — Other Ambulatory Visit: Payer: Self-pay | Admitting: Radiology

## 2023-06-15 ENCOUNTER — Encounter: Payer: Self-pay | Admitting: Psychiatry

## 2023-06-15 ENCOUNTER — Ambulatory Visit (INDEPENDENT_AMBULATORY_CARE_PROVIDER_SITE_OTHER): Payer: Medicare Other | Admitting: Psychiatry

## 2023-06-15 ENCOUNTER — Ambulatory Visit
Admission: RE | Admit: 2023-06-15 | Discharge: 2023-06-15 | Disposition: A | Discharge status: Home / Self Care | Payer: Medicare Other | Source: Ambulatory Visit | Attending: Otolaryngology | Admitting: Otolaryngology

## 2023-06-15 VITALS — BP 105/73 | HR 66 | Wt 171.0 lb

## 2023-06-15 DIAGNOSIS — F331 Major depressive disorder, recurrent, moderate: Secondary | ICD-10-CM | POA: Diagnosis not present

## 2023-06-15 DIAGNOSIS — C770 Secondary and unspecified malignant neoplasm of lymph nodes of head, face and neck: Secondary | ICD-10-CM | POA: Diagnosis not present

## 2023-06-15 DIAGNOSIS — R4189 Other symptoms and signs involving cognitive functions and awareness: Secondary | ICD-10-CM | POA: Diagnosis not present

## 2023-06-15 DIAGNOSIS — G4733 Obstructive sleep apnea (adult) (pediatric): Secondary | ICD-10-CM

## 2023-06-15 DIAGNOSIS — F411 Generalized anxiety disorder: Secondary | ICD-10-CM

## 2023-06-15 DIAGNOSIS — R599 Enlarged lymph nodes, unspecified: Secondary | ICD-10-CM

## 2023-06-15 DIAGNOSIS — C109 Malignant neoplasm of oropharynx, unspecified: Secondary | ICD-10-CM | POA: Insufficient documentation

## 2023-06-15 DIAGNOSIS — J351 Hypertrophy of tonsils: Secondary | ICD-10-CM | POA: Diagnosis present

## 2023-06-15 DIAGNOSIS — R590 Localized enlarged lymph nodes: Secondary | ICD-10-CM | POA: Diagnosis present

## 2023-06-15 MED ORDER — BUSPIRONE HCL 7.5 MG PO TABS: 7.5000 mg | ORAL_TABLET | Freq: Two times a day (BID) | ORAL | 3 refills | Status: DC

## 2023-06-15 MED ORDER — LIDOCAINE HCL (PF) 1 % IJ SOLN
10.0000 mL | Freq: Once | INTRAMUSCULAR | Status: AC
  Administered 2023-06-15: 10 mL via INTRADERMAL
  Filled 2023-06-15: qty 10

## 2023-06-15 MED ORDER — CLONAZEPAM 1 MG PO TABS: 1.0000 mg | ORAL_TABLET | Freq: Two times a day (BID) | ORAL | 1 refills | Status: DC | PRN

## 2023-06-15 MED ORDER — TRAZODONE HCL 50 MG PO TABS: 25.0000 mg | ORAL_TABLET | Freq: Every evening | ORAL | 1 refills | Status: DC | PRN

## 2023-06-15 NOTE — Patient Instructions (Signed)
 Continue  sertraline  150 mg at night  Continue bupropion  450 mg daily Continue Buspar  7.5 mg twice a day Discontinue Ritalin  Start trazodone  25-50 mg at night as needed for insomnia, then increase up to 100 mg at night as needed if needed Continue clonazepam  0.5-1 mg twice a day as needed for anxiety Next appointment-  4/1 at 11:30

## 2023-06-15 NOTE — Procedures (Signed)
 Vascular and Interventional Radiology Procedure Note  Patient: Beatriz Settles DOB: Oct 06, 1955 Medical Record Number: [id number] Note Date/Time: 06/15/23 1:10 PM   Performing Physician: Thom Hall, MD Assistant(s): None  Diagnosis: R tonsillar mass w enlarged cervical LNs   Procedure: RIGHT CERVICAL LYMPH NODE BIOPSY  Anesthesia: Local Anesthetic Complications: None Estimated Blood Loss: Minimal Specimens: Sent for Cytology and Pathology  Findings:  Successful Ultrasound-guided biopsy of R cervical LN. A total of 3 samples were obtained. Hemostasis of the tract was achieved using Manual Pressure.  Plan: Bed rest for 0 hours.  See detailed procedure note with images in PACS. The patient tolerated the procedure well without incident or complication and was returned to Recovery in stable condition.    Thom Hall, MD Vascular and Interventional Radiology [facility] Radiology   Pager. [phone] Clinic. [phone]

## 2023-06-15 NOTE — Discharge Instructions (Signed)
Discussed and given to patient by Deirdre Peer KDD

## 2023-06-16 LAB — CYTOLOGY - NON PAP

## 2023-06-16 LAB — SURGICAL PATHOLOGY

## 2023-06-17 ENCOUNTER — Encounter: Payer: Self-pay | Admitting: Interventional Radiology

## 2023-06-17 DIAGNOSIS — C099 Malignant neoplasm of tonsil, unspecified: Secondary | ICD-10-CM | POA: Insufficient documentation

## 2023-06-17 DIAGNOSIS — C779 Secondary and unspecified malignant neoplasm of lymph node, unspecified: Secondary | ICD-10-CM | POA: Insufficient documentation

## 2023-06-20 ENCOUNTER — Telehealth: Payer: Self-pay | Admitting: Psychiatry

## 2023-06-20 NOTE — Telephone Encounter (Signed)
 Received a message from Dr. Fernande regarding the patient's request for a possible uptitration of clonazepam . A response was sent to acknowledge the request and explain that this is a challenging decision, given the patient's history of a fall and near-falls before his dose was reduced to the current level in April 2023. It was also clarified that no further dose reductions have been made since then, although we have previously discussed the possibility of a gradual taper.  The patient's increased fall risk and parkinsonism symptoms make this a particularly delicate situation, as benzodiazepines can further contribute to instability.   The patient has been referred for therapy to provide additional support, particularly in coping with the emotional distress related to his recent cancer diagnosis.  Will continue to monitor him closely.

## 2023-06-22 ENCOUNTER — Inpatient Hospital Stay: Payer: Medicare Other

## 2023-06-22 ENCOUNTER — Encounter: Payer: Self-pay | Admitting: *Deleted

## 2023-06-22 ENCOUNTER — Inpatient Hospital Stay: Payer: Medicare Other | Attending: Oncology | Admitting: Oncology

## 2023-06-22 ENCOUNTER — Encounter: Payer: Self-pay | Admitting: Oncology

## 2023-06-22 ENCOUNTER — Telehealth: Payer: Self-pay | Admitting: *Deleted

## 2023-06-22 VITALS — BP 94/68 | HR 63 | Resp 17 | Ht 69.0 in | Wt 176.3 lb

## 2023-06-22 DIAGNOSIS — G893 Neoplasm related pain (acute) (chronic): Secondary | ICD-10-CM

## 2023-06-22 DIAGNOSIS — R634 Abnormal weight loss: Secondary | ICD-10-CM | POA: Diagnosis not present

## 2023-06-22 DIAGNOSIS — Z7189 Other specified counseling: Secondary | ICD-10-CM

## 2023-06-22 DIAGNOSIS — C109 Malignant neoplasm of oropharynx, unspecified: Secondary | ICD-10-CM | POA: Insufficient documentation

## 2023-06-22 MED ORDER — OXYCODONE HCL 5 MG PO TABS: 5.0000 mg | ORAL_TABLET | ORAL | 0 refills | Status: DC | PRN

## 2023-06-22 NOTE — Telephone Encounter (Signed)
Left a message on VM regarding  the referral appointment. Appointment date  06/24/23 @ 130pm. Also ask the patient to return the call at [phone].

## 2023-06-23 ENCOUNTER — Telehealth: Payer: Self-pay

## 2023-06-23 NOTE — Telephone Encounter (Signed)
I spoke with patient wife to let her know the time,date,instructions and address for patient's port placement.  'He will come to the [facility] entrance on that Wed 06/30/23 at 1pm for a 2pm procedure. NPO at least 6-8 hours prior and he will need a driver." Patient wife stated she understood everything.

## 2023-06-24 ENCOUNTER — Other Ambulatory Visit: Payer: Self-pay

## 2023-06-24 ENCOUNTER — Encounter: Payer: Self-pay | Admitting: Oncology

## 2023-06-24 ENCOUNTER — Inpatient Hospital Stay: Payer: Medicare Other

## 2023-06-24 ENCOUNTER — Inpatient Hospital Stay (HOSPITAL_BASED_OUTPATIENT_CLINIC_OR_DEPARTMENT_OTHER): Payer: Medicare Other | Admitting: Hospice and Palliative Medicine

## 2023-06-24 ENCOUNTER — Encounter: Payer: Self-pay | Admitting: Radiation Oncology

## 2023-06-24 ENCOUNTER — Ambulatory Visit
Admission: RE | Admit: 2023-06-24 | Discharge: 2023-06-24 | Disposition: A | Discharge status: Home / Self Care | Payer: Medicare Other | Source: Ambulatory Visit | Attending: Radiation Oncology | Admitting: Radiation Oncology

## 2023-06-24 ENCOUNTER — Encounter: Payer: Self-pay | Admitting: Hospice and Palliative Medicine

## 2023-06-24 VITALS — Resp 19 | Wt 177.8 lb

## 2023-06-24 VITALS — BP 90/60 | HR 58 | Resp 16

## 2023-06-24 VITALS — BP 90/61 | HR 59 | Temp 97.0°F | Resp 15 | Ht 69.0 in | Wt 180.6 lb

## 2023-06-24 DIAGNOSIS — I959 Hypotension, unspecified: Secondary | ICD-10-CM

## 2023-06-24 DIAGNOSIS — R5383 Other fatigue: Secondary | ICD-10-CM | POA: Diagnosis not present

## 2023-06-24 DIAGNOSIS — C091 Malignant neoplasm of tonsillar pillar (anterior) (posterior): Secondary | ICD-10-CM | POA: Diagnosis not present

## 2023-06-24 DIAGNOSIS — Z510 Encounter for antineoplastic radiation therapy: Secondary | ICD-10-CM | POA: Diagnosis not present

## 2023-06-24 DIAGNOSIS — C109 Malignant neoplasm of oropharynx, unspecified: Secondary | ICD-10-CM

## 2023-06-24 LAB — CMP (CANCER CENTER ONLY)
ALT: 7 U/L (ref 0–44)
AST: 14 U/L — ABNORMAL LOW (ref 15–41)
Albumin: 4.1 g/dL (ref 3.5–5.0)
Alkaline Phosphatase: 32 U/L — ABNORMAL LOW (ref 38–126)
Anion gap: 8 (ref 5–15)
BUN: 18 mg/dL (ref 8–23)
CO2: 26 mmol/L (ref 22–32)
Calcium: 8.8 mg/dL — ABNORMAL LOW (ref 8.9–10.3)
Chloride: 104 mmol/L (ref 98–111)
Creatinine: 0.93 mg/dL (ref 0.61–1.24)
GFR, Estimated: >60 mL/min (ref >60)
Glucose, Bld: 167 mg/dL — ABNORMAL HIGH (ref 70–99)
Potassium: 4.5 mmol/L (ref 3.5–5.1)
Sodium: 138 mmol/L (ref 135–145)
Total Bilirubin: 0.7 mg/dL (ref 0.0–1.2)
Total Protein: 6.6 g/dL (ref 6.5–8.1)

## 2023-06-24 LAB — CBC WITH DIFFERENTIAL (CANCER CENTER ONLY)
Abs Immature Granulocytes: 0.02 10*3/uL (ref 0.00–0.07)
Basophils Absolute: 0.1 10*3/uL (ref 0.0–0.1)
Basophils Relative: 1 %
Eosinophils Absolute: 0.1 10*3/uL (ref 0.0–0.5)
Eosinophils Relative: 2 %
HCT: 41.6 % (ref 39.0–52.0)
Hemoglobin: 13.8 g/dL (ref 13.0–17.0)
Immature Granulocytes: 0 %
Lymphocytes Relative: 25 %
Lymphs Abs: 1.3 10*3/uL (ref 0.7–4.0)
MCH: 30.1 pg (ref 26.0–34.0)
MCHC: 33.2 g/dL (ref 30.0–36.0)
MCV: 90.6 fL (ref 80.0–100.0)
Monocytes Absolute: 0.3 10*3/uL (ref 0.1–1.0)
Monocytes Relative: 6 %
Neutro Abs: 3.6 10*3/uL (ref 1.7–7.7)
Neutrophils Relative %: 66 %
Platelet Count: 125 10*3/uL — ABNORMAL LOW (ref 150–400)
RBC: 4.59 MIL/uL (ref 4.22–5.81)
RDW: 13.4 % (ref 11.5–15.5)
WBC Count: 5.4 10*3/uL (ref 4.0–10.5)
nRBC: 0 % (ref 0.0–0.2)

## 2023-06-24 MED ORDER — OXYCODONE HCL 5 MG PO TABS: 5.0000 mg | ORAL_TABLET | ORAL | Status: DC | PRN

## 2023-06-24 MED ORDER — SODIUM CHLORIDE 0.9 % IV SOLN
INTRAVENOUS | Status: DC
  Filled 2023-06-24 (×2): qty 250

## 2023-06-24 NOTE — Progress Notes (Signed)
Hematology/Oncology Consult note [name] Telephone:([phone] [phone]  Patient Care Team: [name], MD as PCP - General (Internal Medicine)   Name of the patient: Ricky [name]  [id number]  [birth year]    Reason for referral-new diagnosis of squamous cell carcinoma of the oropharynx   Referring physician-Dr. [name]  Date of visit: 06/24/23   History of presenting illness-patient is a 68 year old male who presented with difficulty swallowing which has been ongoing for the last 6 to 7 months associated with unintentional weight loss.  He was ultimately referred By his dentist to ENT and underwent CT soft tissue neck which showed a 4 cm partially necrotic mass at the glossotonsillar sulcus on the right side and to level 2 lymph nodes measuring 15 and 14 mm that appeared pathologic.  Core needle biopsy of the level 2 lymph node was consistent with squamous cell carcinoma.  Cells diffusely positive for p40 and p16.  He has been referred for further management  Currently patient reports pain during swallowing as well as pain that radiates to his right ear.  He is presently on hydrocodone and using about 4-6 doses every day  ECOG PS- 1  Pain scale- 5   Review of systems- Review of Systems  Constitutional:  Negative for chills, fever, malaise/fatigue and weight loss.  HENT:  Negative for congestion, ear discharge and nosebleeds.        Throat pain  Eyes:  Negative for blurred vision.  Respiratory:  Negative for cough, hemoptysis, sputum production, shortness of breath and wheezing.   Cardiovascular:  Negative for chest pain, palpitations, orthopnea and claudication.  Gastrointestinal:  Negative for abdominal pain, blood in stool, constipation, diarrhea, heartburn, melena, nausea and vomiting.  Genitourinary:  Negative for dysuria, flank pain, frequency, hematuria and urgency.  Musculoskeletal:  Negative for back pain, joint pain and  myalgias.  Skin:  Negative for rash.  Neurological:  Negative for dizziness, tingling, focal weakness, seizures, weakness and headaches.  Endo/Heme/Allergies:  Does not bruise/bleed easily.  Psychiatric/Behavioral:  Negative for depression and suicidal ideas. The patient does not have insomnia.     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   Somnolence 01/08/2023   Overweight (BMI 25.0-29.9) 08/27/2021   Major depression 08/26/2021   Hyponatremia    Parkinson's disease (HCC)    Essential hypertension    Type 2 diabetes mellitus with hyperlipidemia (HCC)    Chronic bilateral thoracic back pain 10/15/2020   Lumbar spine pain 10/15/2020   Thoracic radiculitis 10/15/2020   Chronic pain syndrome 10/15/2020     Past Medical History:  Diagnosis Date   Anxiety    Arthritis    Carpal tunnel syndrome    Depression    Diabetes (HCC)    Hyperlipidemia    Hypertension    Loss of teeth due to extraction    Parkinson's disease [facility])      Past Surgical History:  Procedure Laterality Date   CARPAL TUNNEL RELEASE Right 11/28/2020   Procedure: CARPAL TUNNEL RELEASE ENDOSCOPIC;  Surgeon: Christena Flake, MD;  Location: [facility] ORS;  Service: Orthopedics;  Laterality: Right;   CARPAL TUNNEL RELEASE Left 01/22/2021   Procedure: CARPAL TUNNEL RELEASE ENDOSCOPIC;  Surgeon: Christena Flake, MD;  Location: [facility] ORS;  Service: Orthopedics;  Laterality: Left;   KNEE ARTHROSCOPY Right     Social  History   Socioeconomic History   Marital status: Single    Spouse name: Ricky [name]   Number of children: Not on file   Years of education: Not on file   Highest education level: Not on file  Occupational History   Not on file  Tobacco Use   Smoking status: Never   Smokeless tobacco: Never  Vaping Use   Vaping status: Never Used  Substance and Sexual Activity   Alcohol use: Not  Currently   Drug use: Never   Sexual activity: Not Currently    Birth control/protection: None  Other Topics Concern   Not on file  Social History Narrative   Lives with friend   Social Drivers of Health   Financial Resource Strain: Not on file  Food Insecurity: No Food Insecurity (06/22/2023)   Hunger Vital Sign    Worried About Running Out of Food in the Last Year: Never true    Ran Out of Food in the Last Year: Never true  Transportation Needs: No Transportation Needs (06/22/2023)   PRAPARE - Administrator, Civil Service (Medical): No    Lack of Transportation (Non-Medical): No  Physical Activity: Not on file  Stress: Not on file  Social Connections: Not on file  Intimate Partner Violence: Not At Risk (06/22/2023)   Humiliation, Afraid, Rape, and Kick questionnaire    Fear of Current or Ex-Partner: No    Emotionally Abused: No    Physically Abused: No    Sexually Abused: No     Family History  Problem Relation Age of Onset   Diabetes Mother    Hypertension Mother    CVA Father    Heart attack Father    Depression Sister    Depression Brother      Current Outpatient Medications:    atenolol (TENORMIN) 50 MG tablet, Take 50 mg by mouth daily., Disp: , Rfl:    buPROPion (WELLBUTRIN XL) 150 MG 24 hr tablet, Take 1 tablet (150 mg total) by mouth daily. Total of 450 mg daily. Take along with 300 mg tab, Disp: 30 tablet, Rfl: 5   buPROPion (WELLBUTRIN XL) 300 MG 24 hr tablet, Take 1 tablet (300 mg total) by mouth daily. Total of 450 mg daily. Take along with 150 mg tab, Disp: 30 tablet, Rfl: 5   [START ON 06/25/2023] busPIRone (BUSPAR) 7.5 MG tablet, Take 1 tablet (7.5 mg total) by mouth 2 (two) times daily., Disp: 60 tablet, Rfl: 3   carbidopa-levodopa (SINEMET IR) 25-100 MG tablet, Take 1 tablet by mouth 5 (five) times daily., Disp: , Rfl:    metFORMIN (GLUCOPHAGE) 500 MG tablet, Take 500 mg by mouth 2 (two) times daily with a meal., Disp: , Rfl:    rosuvastatin  (CRESTOR) 20 MG tablet, Take 20 mg by mouth daily., Disp: , Rfl:    sertraline (ZOLOFT) 100 MG tablet, Take 1.5 tablets (150 mg total) by mouth at bedtime., Disp: 45 tablet, Rfl: 3   sildenafil (VIAGRA) 50 MG tablet, Take 50 mg by mouth daily as needed for erectile dysfunction., Disp: , Rfl:    tadalafil (CIALIS) 5 MG tablet, Take 5 mg by mouth daily., Disp: , Rfl:    tamsulosin (FLOMAX) 0.4 MG CAPS capsule, Take by mouth., Disp: , Rfl:    traZODone (DESYREL) 50 MG tablet, Take 0.5-2 tablets (25-100 mg total) by mouth at bedtime as needed for sleep., Disp: 60 tablet, Rfl: 1   [START ON 07/03/2023] clonazePAM (KLONOPIN) 1 MG tablet, Take 1  tablet (1 mg total) by mouth 2 (two) times daily as needed for anxiety. (Patient not taking: Reported on 06/24/2023), Disp: 60 tablet, Rfl: 1   ketoconazole (NIZORAL) 2 % shampoo, Apply 1 application topically every other day. (Patient not taking: Reported on 06/22/2023), Disp: , Rfl:    methylphenidate (RITALIN) 5 MG tablet, Take 1 tablet (5 mg total) by mouth daily. (Patient not taking: Reported on 06/24/2023), Disp: 30 tablet, Rfl: 0   oxyCODONE (OXY IR/ROXICODONE) 5 MG immediate release tablet, Take 1-2 tablets (5-10 mg total) by mouth every 4 (four) hours as needed for severe pain (pain score 7-10)., Disp: , Rfl:    VICTOZA 18 MG/3ML SOPN, Inject 1.2 mg into the skin daily. (Patient not taking: Reported on 06/22/2023), Disp: , Rfl:  No current facility-administered medications for this visit.  Facility-Administered Medications Ordered in Other Visits:    0.9 %  sodium chloride infusion, , Intravenous, Continuous, Borders, Daryl Eastern, NP, Stopped at 06/24/23 1630   Physical exam:  Vitals:   06/22/23 1437  BP: 94/68  Pulse: 63  Resp: 17  SpO2: 96%  Weight: 176 lb 4.8 oz (80 kg)  Height: 5\' 9"  (1.753 m)   Physical Exam HENT:     Mouth/Throat:     Mouth: Mucous membranes are moist.     Pharynx: Oropharynx is clear.     Comments: There is an ill-defined  mass seen in the right glossotonsillar sulcus Cardiovascular:     Rate and Rhythm: Normal rate and regular rhythm.     Heart sounds: Normal heart sounds.  Pulmonary:     Effort: Pulmonary effort is normal.     Breath sounds: Normal breath sounds.  Abdominal:     General: Bowel sounds are normal.     Palpations: Abdomen is soft.  Lymphadenopathy:     Comments: Fullness noted in the level 2 area without a distinct palpable mass  Skin:    General: Skin is warm and dry.  Neurological:     Mental Status: He is alert and oriented to person, place, and time.           Latest Ref Rng & Units 06/24/2023    2:18 PM  CMP  Glucose 70 - 99 mg/dL 130   BUN 8 - 23 mg/dL 18   Creatinine 8.65 - 1.24 mg/dL 7.84   Sodium 696 - 295 mmol/L 138   Potassium 3.5 - 5.1 mmol/L 4.5   Chloride 98 - 111 mmol/L 104   CO2 22 - 32 mmol/L 26   Calcium 8.9 - 10.3 mg/dL 8.8   Total Protein 6.5 - 8.1 g/dL 6.6   Total Bilirubin 0.0 - 1.2 mg/dL 0.7   Alkaline Phos 38 - 126 U/L 32   AST 15 - 41 U/L 14   ALT 0 - 44 U/L 7       Latest Ref Rng & Units 06/24/2023    2:28 PM  CBC  WBC 4.0 - 10.5 K/uL 5.4   Hemoglobin 13.0 - 17.0 g/dL 28.4   Hematocrit 13.2 - 52.0 % 41.6   Platelets 150 - 400 K/uL 125     No images are attached to the encounter.  [place] CORE BIOPSY (LYMPH NODES) Result Date: 06/15/2023 INDICATION: RT Cervical lymph node enlargement.  RIGHT tonsillar mass EXAM: ULTRASOUND-GUIDED RIGHT CERVICAL LYMPH NODE CORE BIOPSY and ASPIRATION COMPARISON:  CT neck, 06/08/2023. MEDICATIONS: None ANESTHESIA/SEDATION: Local anesthetic was administered. COMPLICATIONS: None immediate. TECHNIQUE: Informed written consent was obtained from the patient after  a discussion of the risks, benefits and alternatives to treatment. Questions regarding the procedure were encouraged and answered. Initial ultrasound scanning demonstrated enlarged RIGHT cervical lymph node. An ultrasound image was saved for documentation purposes.  The procedure was planned. A timeout was performed prior to the initiation of the procedure. The operative was prepped and draped in the usual sterile fashion, and a sterile drape was applied covering the operative field. A timeout was performed prior to the initiation of the procedure. Local anesthesia was provided with 1% lidocaine with epinephrine. Under direct ultrasound guidance, an 18 gauge core needle device was utilized to obtain to obtain 3 core needle biopsies of the RIGHT cervical lymph node. The samples were placed in saline and submitted to pathology. The samples appeared relatively pauci-cellular, therefore aspiration with the 18 gauge introducing needle was performed, yielding 3 mL of sanguinous fluid. The needle was removed and superficial hemostasis was achieved with manual compression. Post procedure scan was negative for significant hematoma. A dressing was applied. The patient tolerated the procedure well without immediate postprocedural complication. IMPRESSION: Successful ultrasound guided aspiration and core biopsy of an enlarged RIGHT cervical lymph node. Roanna Banning, MD [facility] Electronically Signed   By: Roanna Banning M.D.   On: 06/15/2023 14:50   CT SOFT TISSUE NECK W CONTRAST Result Date: 06/08/2023 CLINICAL DATA:  Primary tonsillar mass EXAM: CT NECK WITH CONTRAST TECHNIQUE: Multidetector CT imaging of the neck was performed using the standard protocol following the bolus administration of intravenous contrast. RADIATION DOSE REDUCTION: This exam was performed according to the departmental dose-optimization program which includes automated exposure control, adjustment of the mA and/or kV according to patient size and/or use of iterative reconstruction technique. CONTRAST:  75mL ISOVUE-300 IOPAMIDOL (ISOVUE-300) INJECTION 61% COMPARISON:  None Available. FINDINGS: Pharynx and larynx: 4 cm partially necrotic mass at the  glossotonsillar sulcus on the right. This is almost certain to represent a squamous cell cancer. Adjacent lymphadenopathy of a level 2 node on the right measuring approximately 15 mm in diameter. Second pathologic node just lateral to the jugular vein on the right at the level 2 level 3 junction measuring 14 x 11 mm. No lymphadenopathy on the left. Salivary glands: Parotid and submandibular glands are normal. Thyroid: Normal Lymph nodes: See above. Vascular: No abnormal vascular finding. Limited intracranial: Normal Visualized orbits: Normal Mastoids and visualized paranasal sinuses: Clear Skeleton: Ordinary cervical spondylosis. Upper chest: Lung apices are clear. Other: None IMPRESSION: 4 cm partially necrotic mass at the glossotonsillar sulcus on the right. This is almost certain to represent a squamous cell cancer. Adjacent lymphadenopathy of a level 2 node on the right measuring approximately 15 mm in diameter. Second pathologic node just lateral to the jugular vein on the right at the level 2 level 3 junction measuring 14 x 11 mm. Electronically Signed   By: Paulina Fusi M.D.   On: 06/08/2023 16:13    Assessment and plan- Patient is a 68 y.o. male referred for newly diagnosed squamous cell carcinoma of the oropharynx stage I/II T2/T3 N1 M0  Discussed the results of CT soft tissue neck and pathology with the patient and his wife in detail.  I plan to get a PET CT scan to complete staging workup.  Patient has a 4 cm primary oropharyngeal mass with biopsy-proven ipsilateral adenopathy.  He is HPV positive that portends a better prognosis compared to HPV negative.  I would recommend concurrent chemoradiation with weekly cisplatin at 40 mg/m  for 7 doses.  Discussed risks and benefits of cisplatin including all but not limited to nausea vomiting low blood counts, risk of infections and hospitalizations as well as peripheral neuropathy, kidney injury and hearing loss.  Treatment will be given with a curative  intent.  I am referring him to radiation oncology along with nutrition counseling.  Will plan for port placement and chemo teach  Neoplasm related pain: I will stop his Percocet and start him on as needed oxycodone 5 mg every 4 as needed   Cancer Staging  Squamous cell carcinoma of oropharynx (HCC) Staging form: Pharynx - HPV-Mediated Oropharynx, AJCC 8th Edition - Clinical stage from 06/22/2023: Stage II (cT3, cN1, cM0, p16+) - Signed by Creig Hines, MD on 06/22/2023 Stage prefix: Initial diagnosis     Thank you for this kind referral and the opportunity to participate in the care of this patient   Visit Diagnosis 1. Squamous cell carcinoma of oropharynx (HCC)   2. Goals of care, counseling/discussion     Dr. Owens Shark, MD, MPH [facility] at [facility] [phone] 06/24/2023

## 2023-06-24 NOTE — Progress Notes (Signed)
Nutrition Assessment   Reason for Assessment:  New head and neck cancer   ASSESSMENT:  68 year old male with right glossotonsillar sulcus mass, p 16+.  Past medical history of depression, anxiety, Parkinson, DM, HTN, HLD, MDD.  Planning on concurrent chemotherapy and radiation.   Met with patient and significant other [name] following [facility] today.  Reports that his appetite has been decreased since diagnosis (~month or so).  Reports yesterday eating cookies in the morning.  Ate 3/4 of pimento cheese sandwich for lunch and ate barbecue chicken and turnip greens for dinner.  Reports pain when chewing.  NP is adjusting pain medication today.  Also has pain when swallowing as well but not avoiding any particular foods at this time.  [name] prepares meals for patient.    [name] reports blood glucose is under good control.   Nutrition Focused Physical Exam:   Orbital Region: Mild Buccal Region: Mild Upper Arm Region: Mild Thoracic and Lumbar Region: normal Temple Region: normal Clavicle Bone Region: normal Shoulder and Acromion Bone Region: mild Scapular Bone Region: normal Dorsal Hand: normal Patellar Region: mild Anterior Thigh Region: mild Posterior Calf Region: normal Edema (RD assessment): none Hair: reviewed Eyes: reviewed Mouth: reviewed Skin: reviewed Nails: reviewed   Medications: holding atenolol metformin   Labs: glucose 167, calcium 8.8 08/13/22 folate 5.1, Vit D 13.68, vit B 12 438   Anthropometrics:   Height: 69 inches Weight: 177 lb 12.8 oz today 184 lb 6.4 oz on 02/25/23 BMI: 26 Reports unintentional weight loss 30 lbs in the last 2 years Per chart 06/09/2021 189 lb 3.2 oz  4% weight loss in the last 4 months 6% weight loss in the last 2 years   Estimated Energy Needs  Kcals: 2000-2400 Protein: 100-120 g Fluid: 2000-2400 ml   NUTRITION DIAGNOSIS: Inadequate oral intake related to cancer related side effects (jaw pain, pain on swallowing) and likely  depression as evidenced by 6 % weight loss in the last 2 year and 4% weight loss in the past 4 months, not significant as well as decreased intake   MALNUTRITION DIAGNOSIS: None at this time   INTERVENTION:  Encouraged high calorie, high protein diet.  Provided samples of glucerna and ensure complete along with coupons.  Explained low sugar shakes often low in calories.  If blood glucose controlled would encourage high calorie shake (higher in sugar) to help maintain weight. Provided recipes for smoothies and other high calorie foods.  Soft, moist foods discussed as well. Handout provided Contact information given   MONITORING, EVALUATION, GOAL: weight trend, intake   Next Visit: to be scheduled with treatment  Tamaya Pun B. Freida Busman, RD, LDN Registered Dietitian [phone]

## 2023-06-24 NOTE — Progress Notes (Signed)
Symptom Management Clinic [facility] [phone] [phone]  Patient Care Team: [name], MD as PCP - General (Internal Medicine)   NAME OF PATIENT: Ricky [name]  [id number]  Dec 26, 1955   DATE OF VISIT: 06/24/23  REASON FOR CONSULT: Lewayne Pauley is a 68 y.o. 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   INTERVAL HISTORY: Patient with recent diagnosis of squamous cell carcinoma of the glossotonsillar sulcus.  Staging workup is not yet completed.  Patient was seen today by radiation oncology and was referred to [facility] to evaluate hypotension.  Reportedly, BP was initially 86/61 with heart rate of 59.  Patient states he feels weak and fatigued.  Endorses poor oral intake and weight loss.  No fever or chills.  Denies respiratory or GI symptoms.  Patient states that he does have poorly controlled pain in the mouth and face.  He is taking oxycodone 5 mg 2-3 times daily.  He thinks it helped slightly but it is extremely short-lived.  Denies any neurologic complaints. Denies recent fevers or illnesses. Denies any easy bleeding or bruising. Denies chest pain. Denies any nausea, vomiting, constipation, or diarrhea. Denies urinary complaints. Patient offers no further specific complaints today.  PAST MEDICAL HISTORY: Past Medical History:  Diagnosis Date   Anxiety    Arthritis    Carpal tunnel syndrome    Depression    Diabetes (HCC)    Hyperlipidemia    Hypertension    Loss of teeth due to extraction    Parkinson's disease (HCC)     PAST SURGICAL HISTORY:  Past Surgical History:  Procedure Laterality Date   CARPAL TUNNEL RELEASE Right 11/28/2020   Procedure: CARPAL TUNNEL RELEASE ENDOSCOPIC;  Surgeon: Christena Flake, MD;  Location: [facility] ORS;  Service: Orthopedics;  Laterality: Right;   CARPAL TUNNEL RELEASE Left 01/22/2021   Procedure: CARPAL TUNNEL  RELEASE ENDOSCOPIC;  Surgeon: Christena Flake, MD;  Location: [facility] ORS;  Service: Orthopedics;  Laterality: Left;   KNEE ARTHROSCOPY Right     HEMATOLOGY/ONCOLOGY HISTORY:  Oncology History  Squamous cell carcinoma of oropharynx (HCC)  06/22/2023 Initial Diagnosis   Squamous cell carcinoma of oropharynx (HCC)   06/22/2023 Cancer Staging   Staging form: Pharynx - HPV-Mediated Oropharynx, AJCC 8th Edition - Clinical stage from 06/22/2023: Stage II (cT3, cN1, cM0, p16+) - Signed by Creig Hines, MD on 06/22/2023 Stage prefix: Initial diagnosis     ALLERGIES:  is allergic to mirtazapine.  MEDICATIONS:  Current Outpatient Medications  Medication Sig Dispense Refill   atenolol (TENORMIN) 50 MG tablet Take 50 mg by mouth daily.     buPROPion (WELLBUTRIN XL) 150 MG 24 hr tablet Take 1 tablet (150 mg total) by mouth daily. Total of 450 mg daily. Take along with 300 mg tab 30 tablet 5   buPROPion (WELLBUTRIN XL) 300 MG 24 hr tablet Take 1 tablet (300 mg total) by mouth daily. Total of 450 mg daily. Take along with 150 mg tab 30 tablet 5   [START ON 06/25/2023] busPIRone (BUSPAR) 7.5 MG tablet Take 1 tablet (7.5 mg total) by mouth 2 (two) times daily. 60 tablet 3   carbidopa-levodopa (SINEMET IR) 25-100 MG tablet Take 1 tablet by mouth 5 (five) times daily.     metFORMIN (GLUCOPHAGE) 500 MG tablet Take 500 mg by mouth 2 (two) times daily with a meal.     oxyCODONE (OXY IR/ROXICODONE) 5 MG  immediate release tablet Take 1 tablet (5 mg total) by mouth every 4 (four) hours as needed for severe pain (pain score 7-10). 120 tablet 0   rosuvastatin (CRESTOR) 20 MG tablet Take 20 mg by mouth daily.     sertraline (ZOLOFT) 100 MG tablet Take 1.5 tablets (150 mg total) by mouth at bedtime. 45 tablet 3   sildenafil (VIAGRA) 50 MG tablet Take 50 mg by mouth daily as needed for erectile dysfunction.     tadalafil (CIALIS) 5 MG tablet Take 5 mg by mouth daily.     tamsulosin (FLOMAX) 0.4 MG CAPS capsule Take by  mouth.     traZODone (DESYREL) 50 MG tablet Take 0.5-2 tablets (25-100 mg total) by mouth at bedtime as needed for sleep. 60 tablet 1   [START ON 07/03/2023] clonazePAM (KLONOPIN) 1 MG tablet Take 1 tablet (1 mg total) by mouth 2 (two) times daily as needed for anxiety. (Patient not taking: Reported on 06/24/2023) 60 tablet 1   ketoconazole (NIZORAL) 2 % shampoo Apply 1 application topically every other day. (Patient not taking: Reported on 06/22/2023)     methylphenidate (RITALIN) 5 MG tablet Take 1 tablet (5 mg total) by mouth daily. (Patient not taking: Reported on 06/24/2023) 30 tablet 0   VICTOZA 18 MG/3ML SOPN Inject 1.2 mg into the skin daily. (Patient not taking: Reported on 06/22/2023)     No current facility-administered medications for this visit.    VITAL SIGNS: Resp 19   Wt 177 lb 12.8 oz (80.6 kg)   SpO2 99%   BMI 26.26 kg/m  Filed Weights   06/24/23 1430  Weight: 177 lb 12.8 oz (80.6 kg)    Estimated body mass index is 26.26 kg/m as calculated from the following:   Height as of an earlier encounter on 06/24/23: 5\' 9"  (1.753 m).   Weight as of this encounter: 177 lb 12.8 oz (80.6 kg).  LABS: CBC:    Component Value Date/Time   WBC 5.4 06/24/2023 1428   WBC 6.1 08/13/2022 0923   HGB 13.8 06/24/2023 1428   HCT 41.6 06/24/2023 1428   PLT 125 (L) 06/24/2023 1428   MCV 90.6 06/24/2023 1428   NEUTROABS 3.6 06/24/2023 1428   LYMPHSABS 1.3 06/24/2023 1428   MONOABS 0.3 06/24/2023 1428   EOSABS 0.1 06/24/2023 1428   BASOSABS 0.1 06/24/2023 1428   Comprehensive Metabolic Panel:    Component Value Date/Time   NA 138 08/13/2022 0923   K 4.1 08/13/2022 0923   CL 103 08/13/2022 0923   CO2 26 08/13/2022 0923   BUN 13 08/13/2022 0923   CREATININE 0.88 08/13/2022 0923   GLUCOSE 162 (H) 08/13/2022 0923   CALCIUM 9.5 08/13/2022 0923   AST 19 08/13/2022 0923   ALT 6 08/13/2022 0923   ALKPHOS 42 08/13/2022 0923   BILITOT 1.1 08/13/2022 0923   PROT 7.2 08/13/2022 0923    ALBUMIN 4.4 08/13/2022 0923    RADIOGRAPHIC STUDIES: [place] CORE BIOPSY (LYMPH NODES) Result Date: 06/15/2023 INDICATION: RT Cervical lymph node enlargement.  RIGHT tonsillar mass EXAM: ULTRASOUND-GUIDED RIGHT CERVICAL LYMPH NODE CORE BIOPSY and ASPIRATION COMPARISON:  CT neck, 06/08/2023. MEDICATIONS: None ANESTHESIA/SEDATION: Local anesthetic was administered. COMPLICATIONS: None immediate. TECHNIQUE: Informed written consent was obtained from the patient after a discussion of the risks, benefits and alternatives to treatment. Questions regarding the procedure were encouraged and answered. Initial ultrasound scanning demonstrated enlarged RIGHT cervical lymph node. An ultrasound image was saved for documentation purposes. The procedure was planned. A timeout  was performed prior to the initiation of the procedure. The operative was prepped and draped in the usual sterile fashion, and a sterile drape was applied covering the operative field. A timeout was performed prior to the initiation of the procedure. Local anesthesia was provided with 1% lidocaine with epinephrine. Under direct ultrasound guidance, an 18 gauge core needle device was utilized to obtain to obtain 3 core needle biopsies of the RIGHT cervical lymph node. The samples were placed in saline and submitted to pathology. The samples appeared relatively pauci-cellular, therefore aspiration with the 18 gauge introducing needle was performed, yielding 3 mL of sanguinous fluid. The needle was removed and superficial hemostasis was achieved with manual compression. Post procedure scan was negative for significant hematoma. A dressing was applied. The patient tolerated the procedure well without immediate postprocedural complication. IMPRESSION: Successful ultrasound guided aspiration and core biopsy of an enlarged RIGHT cervical lymph node. Roanna Banning, MD [facility] Radiology Electronically Signed   By: Roanna Banning M.D.   On: 06/15/2023 14:50   CT SOFT TISSUE NECK W CONTRAST Result Date: 06/08/2023 CLINICAL DATA:  Primary tonsillar mass EXAM: CT NECK WITH CONTRAST TECHNIQUE: Multidetector CT imaging of the neck was performed using the standard protocol following the bolus administration of intravenous contrast. RADIATION DOSE REDUCTION: This exam was performed according to the departmental dose-optimization program which includes automated exposure control, adjustment of the mA and/or kV according to patient size and/or use of iterative reconstruction technique. CONTRAST:  75mL ISOVUE-300 IOPAMIDOL (ISOVUE-300) INJECTION 61% COMPARISON:  None Available. FINDINGS: Pharynx and larynx: 4 cm partially necrotic mass at the glossotonsillar sulcus on the right. This is almost certain to represent a squamous cell cancer. Adjacent lymphadenopathy of a level 2 node on the right measuring approximately 15 mm in diameter. Second pathologic node just lateral to the jugular vein on the right at the level 2 level 3 junction measuring 14 x 11 mm. No lymphadenopathy on the left. Salivary glands: Parotid and submandibular glands are normal. Thyroid: Normal Lymph nodes: See above. Vascular: No abnormal vascular finding. Limited intracranial: Normal Visualized orbits: Normal Mastoids and visualized paranasal sinuses: Clear Skeleton: Ordinary cervical spondylosis. Upper chest: Lung apices are clear. Other: None IMPRESSION: 4 cm partially necrotic mass at the glossotonsillar sulcus on the right. This is almost certain to represent a squamous cell cancer. Adjacent lymphadenopathy of a level 2 node on the right measuring approximately 15 mm in diameter. Second pathologic node just lateral to the jugular vein on the right at the level 2 level 3 junction measuring 14 x 11 mm. Electronically Signed   By: Paulina Fusi M.D.   On: 06/08/2023 16:13    PERFORMANCE STATUS (ECOG) : 1 - Symptomatic but completely ambulatory  Review of  Systems Unless otherwise noted, a complete review of systems is negative.  Physical Exam General: NAD Cardiovascular: regular rate and rhythm Pulmonary: clear ant fields Abdomen: soft, nontender, + bowel sounds GU: no suprapubic tenderness Extremities: no edema, no joint deformities Skin: no rashes Neurological: Weakness but otherwise nonfocal  IMPRESSION/PLAN: Squamous cell carcinoma of the head neck -patient is already seen oncologist today pending XRT.  Is pending PET scan and port placement next week.    Hypotension -BP improved by the time patient presented to my clinic.  Likely poor oral intake is a contributing factor.  Also, patient on atenolol.  Discussed with wife option of holding atenolol and tracking home BPs.  This had been previously discussed by  patient's PCP.  Will proceed with gentle IV fluids today.  Patient may benefit from ongoing supportive care.  Poor oral intake  -patient meeting nutritionist today.  Referral to establish with SLP.  Proceed with IV fluids and supportive care today.  Neoplasm related pain -poorly controlled on oxycodone.  Will liberalize dosing to 5 to 10 mg every 4 hours as needed.  Patient may benefit from starting a long-acting opioid.  Discussed bowel regimen in detail to prevent opioid-induced constipation.  Anxiety -patient has chronic anxiety.  Followed by psychiatry with recent dose reduction of his clonazepam.  Will send [facility].   Patient expressed understanding and was in agreement with this plan. He also understands that He can call clinic at any time with any questions, concerns, or complaints.   Thank you for allowing me to participate in the care of this very pleasant patient.   Time Total: 20 minutes  Visit consisted of counseling and education dealing with the complex and emotionally intense issues of symptom management in the setting of serious illness.Greater than 50%  of this time was spent counseling and  coordinating care related to the above assessment and plan.  Signed by: Laurette Schimke, PhD, NP-C

## 2023-06-24 NOTE — Progress Notes (Signed)
Patient asymptomatic. BP low. Discussed with patient to not take his Atenolol. Patient understood. Josh, NP aware and agreed. Wife will continue to check BP throughout the night. She will also call tomorrow if BP is unchanged. Wife will also call his PCP to discuss findings. Educated if things get worse to go to the ER.

## 2023-06-24 NOTE — Consult Note (Signed)
NEW PATIENT EVALUATION  Name: Ricky [name]  MRN: [id number]  Date:   06/24/2023     DOB: 1955-08-27   This 68 y.o. male patient presents to the clinic for initial evaluation of p16 positive squamous cell carcinoma of the glossotonsillar sulcus right is yet not completely staged.  REFERRING PHYSICIAN: [name], MD  CHIEF COMPLAINT:  Chief Complaint  Patient presents with   Consult    DIAGNOSIS: The encounter diagnosis was Squamous cell carcinoma of oropharynx (HCC).   PREVIOUS INVESTIGATIONS:  CT scan reviewed PET CT scan pending Pathology report reviewed Clinical notes reviewed  HPI: Patient is a 68 year old male who presented with increasing oral pain right ear pain.  He was found to have a right glossotonsillar mass.  CT scan of the head and neck showed a 4 cm partially necrotic mass of the consult glossotonsillar sulcus on the right.  There was adjacent lymphadenopathy at level 2 nodes measuring approximately 15 mm.  There is also a second pathologic node just lateral.  He has a PET CT scan pending.  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  He has a PET CT scan planned for Monday.  Again complains of some dysphagia oral pain and is currently on narcotic analgesics.  PLANNED TREATMENT REGIMEN: Concurrent chemoradiation  PAST MEDICAL HISTORY:  has a past medical history of Anxiety, Arthritis, Carpal tunnel syndrome, Depression, Diabetes (HCC), Hyperlipidemia, Hypertension, Loss of teeth due to extraction, and Parkinson's disease (HCC).    PAST SURGICAL HISTORY:  Past Surgical History:  Procedure Laterality Date   CARPAL TUNNEL RELEASE Right 11/28/2020   Procedure: CARPAL TUNNEL RELEASE ENDOSCOPIC;  Surgeon: Christena Flake, MD;  Location: [facility] ORS;  Service: Orthopedics;  Laterality: Right;   CARPAL TUNNEL RELEASE Left 01/22/2021   Procedure: CARPAL TUNNEL  RELEASE ENDOSCOPIC;  Surgeon: Christena Flake, MD;  Location: [facility] ORS;  Service: Orthopedics;  Laterality: Left;   KNEE ARTHROSCOPY Right     FAMILY HISTORY: family history includes CVA in his father; Depression in his brother and sister; Diabetes in his mother; Heart attack in his father; Hypertension in his mother.  SOCIAL HISTORY:  reports that he has never smoked. He has never used smokeless tobacco. He reports that he does not currently use alcohol. He reports that he does not use drugs.  ALLERGIES: Mirtazapine  MEDICATIONS:  Current Outpatient Medications  Medication Sig Dispense Refill   atenolol (TENORMIN) 50 MG tablet Take 50 mg by mouth daily.     buPROPion (WELLBUTRIN XL) 150 MG 24 hr tablet Take 1 tablet (150 mg total) by mouth daily. Total of 450 mg daily. Take along with 300 mg tab 30 tablet 5   buPROPion (WELLBUTRIN XL) 300 MG 24 hr tablet Take 1 tablet (300 mg total) by mouth daily. Total of 450 mg daily. Take along with 150 mg tab 30 tablet 5   [START ON 06/25/2023] busPIRone (BUSPAR) 7.5 MG tablet Take 1 tablet (7.5 mg total) by mouth 2 (two) times daily. 60 tablet 3   carbidopa-levodopa (SINEMET IR) 25-100 MG tablet Take 1 tablet by mouth 5 (five) times daily.     [START ON 07/03/2023] clonazePAM (KLONOPIN) 1 MG tablet Take 1 tablet (1 mg total) by mouth 2 (two) times daily as needed for anxiety. (Patient not taking: Reported on 06/22/2023) 60 tablet 1   ketoconazole (NIZORAL) 2 % shampoo Apply 1 application topically every  other day. (Patient not taking: Reported on 06/22/2023)     metFORMIN (GLUCOPHAGE) 500 MG tablet Take 500 mg by mouth 2 (two) times daily with a meal.     methylphenidate (RITALIN) 5 MG tablet Take 1 tablet (5 mg total) by mouth daily. (Patient not taking: Reported on 06/22/2023) 30 tablet 0   oxyCODONE (OXY IR/ROXICODONE) 5 MG immediate release tablet Take 1 tablet (5 mg total) by mouth every 4 (four) hours as needed for severe pain (pain score 7-10). 120  tablet 0   rosuvastatin (CRESTOR) 20 MG tablet Take 20 mg by mouth daily.     sertraline (ZOLOFT) 100 MG tablet Take 1.5 tablets (150 mg total) by mouth at bedtime. 45 tablet 3   sildenafil (VIAGRA) 50 MG tablet Take 50 mg by mouth daily as needed for erectile dysfunction.     tadalafil (CIALIS) 5 MG tablet Take 5 mg by mouth daily.     tamsulosin (FLOMAX) 0.4 MG CAPS capsule Take by mouth.     traZODone (DESYREL) 50 MG tablet Take 0.5-2 tablets (25-100 mg total) by mouth at bedtime as needed for sleep. 60 tablet 1   VICTOZA 18 MG/3ML SOPN Inject 1.2 mg into the skin daily. (Patient not taking: Reported on 06/22/2023)     No current facility-administered medications for this encounter.    ECOG PERFORMANCE STATUS:  1 - Symptomatic but completely ambulatory  REVIEW OF SYSTEMS: Patient has history of anxiety arthritis adult onset diabetes hypertension Patient denies any weight loss, fatigue, weakness, fever, chills or night sweats. Patient denies any loss of vision, blurred vision. Patient denies any ringing  of the ears or hearing loss. No irregular heartbeat. Patient denies heart murmur or history of fainting. Patient denies any chest pain or pain radiating to her upper extremities. Patient denies any shortness of breath, difficulty breathing at night, cough or hemoptysis. Patient denies any swelling in the lower legs. Patient denies any nausea vomiting, vomiting of blood, or coffee ground material in the vomitus. Patient denies any stomach pain. Patient states has had normal bowel movements no significant constipation or diarrhea. Patient denies any dysuria, hematuria or significant nocturia. Patient denies any problems walking, swelling in the joints or loss of balance. Patient denies any skin changes, loss of hair or loss of weight. Patient denies any excessive worrying or anxiety or significant depression. Patient denies any problems with insomnia. Patient denies excessive thirst, polyuria,  polydipsia. Patient denies any swollen glands, patient denies easy bruising or easy bleeding. Patient denies any recent infections, allergies or URI. Patient "s visual fields have not changed significantly in recent time. Patient denies any weight loss, fatigue, weakness, fever, chills or night sweats. Patient denies any loss of vision, blurred vision. Patient denies any ringing  of the ears or hearing loss. No irregular heartbeat. Patient denies heart murmur or history of fainting. Patient denies any chest pain or pain radiating to her upper extremities. Patient denies any shortness of breath, difficulty breathing at night, cough or hemoptysis. Patient denies any swelling in the lower legs. Patient denies any nausea vomiting, vomiting of blood, or coffee ground material in the vomitus. Patient denies any stomach pain. Patient states has had normal bowel movements no significant constipation or diarrhea. Patient denies any dysuria, hematuria or significant nocturia. Patient denies any problems walking, swelling in the joints or loss of balance. Patient denies any skin changes, loss of hair or loss of weight. Patient denies any excessive worrying or anxiety or significant depression. Patient denies  any problems with insomnia. Patient denies excessive thirst, polyuria, polydipsia. Patient denies any swollen glands, patient denies easy bruising or easy bleeding. Patient denies any recent infections, allergies or URI. Patient "s visual fields have not changed significantly in recent time.   PHYSICAL EXAM: BP 90/61   Pulse (!) 59   Temp (!) 97 F (36.1 C)   Resp 15   Ht 5\' 9"  (1.753 m)   Wt 180 lb 9.6 oz (81.9 kg)   BMI 26.67 kg/m  Patient has trismus.  Oral examination is difficult.  He does have some firmness under his right neck consistent with metastatic disease to his cervical nodes.  Left neck is clear.  Well-developed well-nourished patient in NAD. HEENT reveals PERLA, EOMI, discs not visualized.   Oral cavity is clear. No oral mucosal lesions are identified. Neck is clear without evidence of cervical or supraclavicular adenopathy. Lungs are clear to A&P. Cardiac examination is essentially unremarkable with regular rate and rhythm without murmur rub or thrill. Abdomen is benign with no organomegaly or masses noted. Motor sensory and DTR levels are equal and symmetric in the upper and lower extremities. Cranial nerves II through XII are grossly intact. Proprioception is intact. No peripheral adenopathy or edema is identified. No motor or sensory levels are noted. Crude visual fields are within normal range.  LABORATORY DATA: Pathology reports reviewed    RADIOLOGY RESULTS: CT scan of the head and neck reviewed PET CT scan pending   IMPRESSION: Probable stage III p16 positive squamous cell carcinoma of the oropharynx in 68 year old male  PLAN: At this time like evaluate his PET/CT scan for complete staging.  I would recommend concurrent chemoradiation therapy with curative intent.  Would plan on delivering 68 Gray to the primary tumor and the PET positive nodes in his head and neck area treated the remainder of his neck nodes to 54 Gray using IMRT treatment planning and delivery and dose painting technique.  Risks and benefits of treatment including increased dysphagia possible oral mucositis decreased taste skin reaction fatigue alteration blood counts all were described in detail to the patient and his wife.  I have set him up for simulation early next week once the PET scan is done.  He will be seeing symptom management today for management of his pain medication hypertension and diet.  They both comprehend my treatment plan well.  Simulation appointment was given.  There will be extra effort by both professional staff as well as technical staff to coordinate and manage concurrent chemoradiation and ensuing side effects during his treatments.   I would like to take this opportunity to thank you  for allowing me to participate in the care of your patient.Carmina Miller, MD

## 2023-06-25 ENCOUNTER — Ambulatory Visit: Payer: Medicare Other | Admitting: Internal Medicine

## 2023-06-28 ENCOUNTER — Ambulatory Visit
Admission: RE | Admit: 2023-06-28 | Discharge: 2023-06-28 | Disposition: A | Discharge status: Home / Self Care | Payer: Medicare Other | Source: Ambulatory Visit | Attending: Oncology | Admitting: Oncology

## 2023-06-28 ENCOUNTER — Other Ambulatory Visit: Payer: Self-pay

## 2023-06-28 ENCOUNTER — Other Ambulatory Visit: Payer: Self-pay | Admitting: *Deleted

## 2023-06-28 ENCOUNTER — Telehealth: Payer: Self-pay | Admitting: *Deleted

## 2023-06-28 ENCOUNTER — Encounter: Payer: Self-pay | Admitting: Oncology

## 2023-06-28 ENCOUNTER — Telehealth: Payer: Self-pay

## 2023-06-28 ENCOUNTER — Other Ambulatory Visit: Payer: Self-pay | Admitting: Psychiatry

## 2023-06-28 DIAGNOSIS — C109 Malignant neoplasm of oropharynx, unspecified: Secondary | ICD-10-CM

## 2023-06-28 MED ORDER — FENTANYL 12 MCG/HR TD PT72: 3.0000 | MEDICATED_PATCH | TRANSDERMAL | 0 refills | Status: DC

## 2023-06-28 MED ORDER — FENTANYL 12 MCG/HR TD PT72: 1.0000 | MEDICATED_PATCH | TRANSDERMAL | 0 refills | Status: DC

## 2023-06-28 NOTE — Telephone Encounter (Signed)
 Also the oxycodone is not working but short time. The wife states that Ricky [name] rec: long acting pain med and to ask [name] to se if he can have long acting med

## 2023-06-28 NOTE — Telephone Encounter (Signed)
 Can you send him fentanyl patch prescription for 12 mcg sherry? Josh- Lorain Childes

## 2023-06-28 NOTE — Telephone Encounter (Signed)
 she was notified. and she is not happy with what you are doing. she states that he has cancer and that he hasn't failing and that she doesn't understand why you will not give him the medication.

## 2023-06-28 NOTE — Telephone Encounter (Signed)
 Discussed her concerns at length for a total of 20 minutes. She is worried about his anxiety and expressed frustration that I am not prescribing additional clonazepam, as she believes that every other provider considers it reasonable to increase the dose.  I explained my concerns regarding his history of falls prior to the dose reduction in 2023 (though she states she does not recall any falls) and his hypotension. She states that he would not fall since he is in the best most off the time and also stated that it does not matter even if the medication lowers his blood pressure.  I provided psychoeducation on why increasing the dose would not be a safe practice, while also acknowledging his stress related to his cancer diagnosis and treatment. She agreed to the following:  - She will receive the fentanyl patch after receiving a call from his provider's office. - She will measure BP/HR at home twice daily until the next visit. - A follow-up visit has been scheduled. 2/20 at 3:30, video (she declined in person due to the distance)

## 2023-06-28 NOTE — Telephone Encounter (Addendum)
 Please inform him that we have not made any changes to his clonazepam dosage since April 2023, as previously discussed. There was also a concern about falls even before starting the current dose. According to the chart, he has lower blood pressure, which further contraindicates the use of clonazepam or other sleep medications.  It appears that his provider has started a new medication for pain, so hopefully, this will help improve his sleep to some extent.  If, after trying the new medication or fentanyl patch for pain, he is still interested in trying ramelteon for sleep, we can proceed with that option.

## 2023-06-28 NOTE — Telephone Encounter (Signed)
 pt wife left a message stating that the trazodone is not working and that pt needs to go back up on the clonazepam dosage. pt was last seen on 2-4 next appt 4-1

## 2023-06-29 ENCOUNTER — Telehealth: Payer: Self-pay

## 2023-06-29 ENCOUNTER — Other Ambulatory Visit: Payer: Self-pay | Admitting: Radiology

## 2023-06-29 ENCOUNTER — Ambulatory Visit
Admission: RE | Admit: 2023-06-29 | Discharge: 2023-06-29 | Disposition: A | Discharge status: Home / Self Care | Payer: Medicare Other | Source: Ambulatory Visit | Attending: Radiation Oncology | Admitting: Radiation Oncology

## 2023-06-29 ENCOUNTER — Other Ambulatory Visit: Payer: Self-pay

## 2023-06-29 ENCOUNTER — Other Ambulatory Visit: Payer: Self-pay | Admitting: Oncology

## 2023-06-29 DIAGNOSIS — Z510 Encounter for antineoplastic radiation therapy: Secondary | ICD-10-CM | POA: Diagnosis not present

## 2023-06-29 MED ORDER — FENTANYL 12 MCG/HR TD PT72: 1.0000 | MEDICATED_PATCH | TRANSDERMAL | 0 refills | Status: DC

## 2023-06-29 NOTE — Telephone Encounter (Signed)
 Attempted to complete PA -cover my meds for FentaNYL. KEY: [id number] Error message stating ID member not found. Rep will send request via fax which will need to be completed and sent to plan.

## 2023-06-29 NOTE — Telephone Encounter (Signed)
 Returned call regarding patient in pain, Dr. [name] sent fentanyl patches. Order should be place 1 patch every 3 days. Order error should not say place 3 patches. Patient and wife verbalizes understanding and aware.

## 2023-06-30 ENCOUNTER — Other Ambulatory Visit: Payer: Self-pay

## 2023-06-30 ENCOUNTER — Ambulatory Visit
Admission: RE | Admit: 2023-06-30 | Discharge: 2023-06-30 | Disposition: A | Discharge status: Home / Self Care | Payer: Medicare Other | Source: Ambulatory Visit | Attending: Oncology | Admitting: Oncology

## 2023-06-30 DIAGNOSIS — C109 Malignant neoplasm of oropharynx, unspecified: Secondary | ICD-10-CM | POA: Insufficient documentation

## 2023-06-30 HISTORY — PX: IR IMAGING GUIDED PORT INSERTION: IMG5740

## 2023-06-30 LAB — GLUCOSE, CAPILLARY
Glucose-Capillary: 126 mg/dL — ABNORMAL HIGH (ref 70–99)
Glucose-Capillary: 156 mg/dL — ABNORMAL HIGH (ref 70–99)

## 2023-06-30 MED ORDER — HEPARIN SOD (PORK) LOCK FLUSH 100 UNIT/ML IV SOLN
500.0000 [IU] | Freq: Once | INTRAVENOUS | Status: AC
  Administered 2023-06-30: 500 [IU] via INTRAVENOUS

## 2023-06-30 MED ORDER — MIDAZOLAM HCL 2 MG/2ML IJ SOLN
INTRAMUSCULAR | Status: AC | PRN
  Administered 2023-06-30 (×2): 1 mg via INTRAVENOUS

## 2023-06-30 MED ORDER — FENTANYL CITRATE (PF) 100 MCG/2ML IJ SOLN
INTRAMUSCULAR | Status: DC
Start: 2023-06-30 — End: 2023-07-01
  Filled 2023-06-30: qty 2

## 2023-06-30 MED ORDER — HEPARIN SOD (PORK) LOCK FLUSH 100 UNIT/ML IV SOLN
INTRAVENOUS | Status: AC
  Filled 2023-06-30: qty 5

## 2023-06-30 MED ORDER — MIDAZOLAM HCL 2 MG/2ML IJ SOLN
INTRAMUSCULAR | Status: AC
  Filled 2023-06-30: qty 4

## 2023-06-30 MED ORDER — LIDOCAINE-EPINEPHRINE 1 %-1:100000 IJ SOLN
INTRAMUSCULAR | Status: AC
  Filled 2023-06-30: qty 1

## 2023-06-30 MED ORDER — LIDOCAINE-EPINEPHRINE 1 %-1:100000 IJ SOLN
17.0000 mL | Freq: Once | INTRAMUSCULAR | Status: AC
  Administered 2023-06-30: 17 mL via INTRADERMAL

## 2023-06-30 MED ORDER — SODIUM CHLORIDE 0.9 % IV SOLN: INTRAVENOUS | Status: DC

## 2023-06-30 MED ORDER — FENTANYL CITRATE (PF) 100 MCG/2ML IJ SOLN
INTRAMUSCULAR | Status: AC | PRN
  Administered 2023-06-30: 25 ug via INTRAVENOUS
  Administered 2023-06-30: 50 ug via INTRAVENOUS

## 2023-06-30 NOTE — Procedures (Signed)
Interventional Radiology Procedure Note  Procedure: Placement of a right IJ approach single lumen Bard ClearVue PowerPort.  Tip is positioned at the superior cavoatrial junction and catheter is ready for immediate use.  Complications: No immediate Recommendations:  - Ok to shower tomorrow - Do not submerge for 7 days - Routine line care   Signed,  Sterling Big, MD

## 2023-06-30 NOTE — H&P (Signed)
 Chief Complaint: Patient was seen in consultation today for oropharyngeal cancer; port-a-catheter placement.   Referring Physician(s): Creig Hines  Supervising Physician: Malachy Moan  Patient Status: [facility] - Out-pt  History of Present Illness: Ricky [name] is a 68 y.o. 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 He presented with difficulty swallowing which had been ongoing for approximately 7 months. This was associated with unintentional weight loss. He was referred to ENT and subsequent work up showed a 4 cm partially necrotic mass at the glossotonsillar sulcus on the right side along with lymphadenopathy. He is familiar to IR from a lymph node biopsy 06/15/23 and pathology returned positive for SCC.   His oncology team is preparing him for chemoradiation and he will require durable venous access. Interventional Radiology has been asked to evaluate this patient for an image-guided port-a-catheter placement.   Past Medical History:  Diagnosis Date   Anxiety    Arthritis    Carpal tunnel syndrome    Depression    Diabetes (HCC)    Hyperlipidemia    Hypertension    Loss of teeth due to extraction    Parkinson's disease [facility])     Past Surgical History:  Procedure Laterality Date   CARPAL TUNNEL RELEASE Right 11/28/2020   Procedure: CARPAL TUNNEL RELEASE ENDOSCOPIC;  Surgeon: Christena Flake, MD;  Location: [facility] ORS;  Service: Orthopedics;  Laterality: Right;   CARPAL TUNNEL RELEASE Left 01/22/2021   Procedure: CARPAL TUNNEL RELEASE ENDOSCOPIC;  Surgeon: Christena Flake, MD;  Location: [facility] ORS;  Service: Orthopedics;  Laterality: Left;   KNEE ARTHROSCOPY Right     Allergies: Mirtazapine  Medications: Prior to Admission medications   Medication Sig Start Date End Date Taking? Authorizing Provider  atenolol (TENORMIN) 50 MG tablet Take 50 mg by mouth daily.  06/14/20   [provider]  buPROPion (WELLBUTRIN XL) 150 MG 24 hr tablet Take 1 tablet (150 mg total) by mouth daily. Total of 450 mg daily. Take along with 300 mg tab 05/27/23 11/23/23  Neysa Hotter, MD  buPROPion (WELLBUTRIN XL) 300 MG 24 hr tablet Take 1 tablet (300 mg total) by mouth daily. Total of 450 mg daily. Take along with 150 mg tab 05/27/23 11/23/23  Neysa Hotter, MD  busPIRone (BUSPAR) 7.5 MG tablet Take 1 tablet (7.5 mg total) by mouth 2 (two) times daily. 06/25/23 10/23/23  Neysa Hotter, MD  carbidopa-levodopa (SINEMET IR) 25-100 MG tablet Take 1 tablet by mouth 5 (five) times daily. 12/30/20   [provider]  clonazePAM (KLONOPIN) 1 MG tablet Take 1 tablet (1 mg total) by mouth 2 (two) times daily as needed for anxiety. Patient not taking: Reported on 06/24/2023 07/03/23 09/01/23  Neysa Hotter, MD  fentaNYL (DURAGESIC) 12 MCG/HR Place 1 patch onto the skin every 3 (three) days. 06/29/23   Creig Hines, MD  ketoconazole (NIZORAL) 2 % shampoo Apply 1 application topically every other day. Patient not taking: Reported on 06/22/2023 05/17/20   [provider]  metFORMIN (GLUCOPHAGE) 500 MG tablet Take 500 mg by mouth 2 (two) times daily with a meal.    [provider]  methylphenidate (RITALIN) 5 MG tablet Take 1 tablet (5 mg total) by mouth daily. Patient not taking: Reported on 06/24/2023 05/20/23 06/19/23  Neysa Hotter, MD  oxyCODONE (OXY IR/ROXICODONE) 5 MG immediate release tablet Take 1-2 tablets (5-10 mg total) by mouth every 4 (four) hours as needed  for severe pain (pain score 7-10). 06/24/23   Borders, Daryl Eastern, NP  rosuvastatin (CRESTOR) 20 MG tablet Take 20 mg by mouth daily. 04/21/21   [provider]  sertraline (ZOLOFT) 100 MG tablet Take 1.5 tablets (150 mg total) by mouth at bedtime. 05/07/23 09/04/23  Neysa Hotter, MD  sildenafil (VIAGRA) 50 MG tablet Take 50 mg by mouth daily as needed for erectile dysfunction.    [provider]   tadalafil (CIALIS) 5 MG tablet Take 5 mg by mouth daily.    [provider]  tamsulosin (FLOMAX) 0.4 MG CAPS capsule Take by mouth. 05/25/23 05/24/24  [provider]  traZODone (DESYREL) 50 MG tablet Take 0.5-2 tablets (25-100 mg total) by mouth at bedtime as needed for sleep. 06/15/23 08/14/23  Neysa Hotter, MD  VICTOZA 18 MG/3ML SOPN Inject 1.2 mg into the skin daily. Patient not taking: Reported on 06/22/2023 07/31/20   [provider]     Family History  Problem Relation Age of Onset   Diabetes Mother    Hypertension Mother    CVA Father    Heart attack Father    Depression Sister    Depression Brother     Social History   Socioeconomic History   Marital status: Single    Spouse name: [name]   Number of children: Not on file   Years of education: Not on file   Highest education level: Not on file  Occupational History   Not on file  Tobacco Use   Smoking status: Never   Smokeless tobacco: Never  Vaping Use   Vaping status: Never Used  Substance and Sexual Activity   Alcohol use: Not Currently   Drug use: Never   Sexual activity: Not Currently    Birth control/protection: None  Other Topics Concern   Not on file  Social History Narrative   Lives with friend   Social Drivers of Health   Financial Resource Strain: Not on file  Food Insecurity: No Food Insecurity (06/22/2023)   Hunger Vital Sign    Worried About Running Out of Food in the Last Year: Never true    Ran Out of Food in the Last Year: Never true  Transportation Needs: No Transportation Needs (06/22/2023)   PRAPARE - Administrator, Civil Service (Medical): No    Lack of Transportation (Non-Medical): No  Physical Activity: Not on file  Stress: Not on file  Social Connections: Not on file    Review of Systems: A 12 point ROS discussed and pertinent positives are indicated in the HPI above.  All other systems are negative.  Review of Systems  HENT:  Positive for sore  throat and trouble swallowing.   All other systems reviewed and are negative.   Vital Signs: BP (!) 141/75   Pulse 74   Resp 19   Ht 5\' 9"  (1.753 m)   Wt 175 lb (79.4 kg)   SpO2 93%   BMI 25.84 kg/m   Physical Exam Constitutional:      General: He is not in acute distress.    Appearance: He is not ill-appearing.  HENT:     Mouth/Throat:     Mouth: Mucous membranes are moist.     Pharynx: Oropharynx is clear.  Cardiovascular:     Rate and Rhythm: Normal rate.  Pulmonary:     Effort: Pulmonary effort is normal.  Abdominal:     Tenderness: There is no abdominal tenderness.  Skin:    General:  Skin is warm and dry.  Neurological:     Mental Status: He is alert and oriented to person, place, and time.  Psychiatric:        Mood and Affect: Mood normal.        Behavior: Behavior normal.        Thought Content: Thought content normal.        Judgment: Judgment normal.     Imaging: [place] CORE BIOPSY (LYMPH NODES) Result Date: 06/15/2023 INDICATION: RT Cervical lymph node enlargement.  RIGHT tonsillar mass EXAM: ULTRASOUND-GUIDED RIGHT CERVICAL LYMPH NODE CORE BIOPSY and ASPIRATION COMPARISON:  CT neck, 06/08/2023. MEDICATIONS: None ANESTHESIA/SEDATION: Local anesthetic was administered. COMPLICATIONS: None immediate. TECHNIQUE: Informed written consent was obtained from the patient after a discussion of the risks, benefits and alternatives to treatment. Questions regarding the procedure were encouraged and answered. Initial ultrasound scanning demonstrated enlarged RIGHT cervical lymph node. An ultrasound image was saved for documentation purposes. The procedure was planned. A timeout was performed prior to the initiation of the procedure. The operative was prepped and draped in the usual sterile fashion, and a sterile drape was applied covering the operative field. A timeout was performed prior to the initiation of the procedure. Local anesthesia was provided with 1% lidocaine with  epinephrine. Under direct ultrasound guidance, an 18 gauge core needle device was utilized to obtain to obtain 3 core needle biopsies of the RIGHT cervical lymph node. The samples were placed in saline and submitted to pathology. The samples appeared relatively pauci-cellular, therefore aspiration with the 18 gauge introducing needle was performed, yielding 3 mL of sanguinous fluid. The needle was removed and superficial hemostasis was achieved with manual compression. Post procedure scan was negative for significant hematoma. A dressing was applied. The patient tolerated the procedure well without immediate postprocedural complication. IMPRESSION: Successful ultrasound guided aspiration and core biopsy of an enlarged RIGHT cervical lymph node. Roanna Banning, MD [facility] Radiology Electronically Signed   By: Roanna Banning M.D.   On: 06/15/2023 14:50   CT SOFT TISSUE NECK W CONTRAST Result Date: 06/08/2023 CLINICAL DATA:  Primary tonsillar mass EXAM: CT NECK WITH CONTRAST TECHNIQUE: Multidetector CT imaging of the neck was performed using the standard protocol following the bolus administration of intravenous contrast. RADIATION DOSE REDUCTION: This exam was performed according to the departmental dose-optimization program which includes automated exposure control, adjustment of the mA and/or kV according to patient size and/or use of iterative reconstruction technique. CONTRAST:  75mL ISOVUE-300 IOPAMIDOL (ISOVUE-300) INJECTION 61% COMPARISON:  None Available. FINDINGS: Pharynx and larynx: 4 cm partially necrotic mass at the glossotonsillar sulcus on the right. This is almost certain to represent a squamous cell cancer. Adjacent lymphadenopathy of a level 2 node on the right measuring approximately 15 mm in diameter. Second pathologic node just lateral to the jugular vein on the right at the level 2 level 3 junction measuring 14 x 11 mm. No lymphadenopathy on the  left. Salivary glands: Parotid and submandibular glands are normal. Thyroid: Normal Lymph nodes: See above. Vascular: No abnormal vascular finding. Limited intracranial: Normal Visualized orbits: Normal Mastoids and visualized paranasal sinuses: Clear Skeleton: Ordinary cervical spondylosis. Upper chest: Lung apices are clear. Other: None IMPRESSION: 4 cm partially necrotic mass at the glossotonsillar sulcus on the right. This is almost certain to represent a squamous cell cancer. Adjacent lymphadenopathy of a level 2 node on the right measuring approximately 15 mm in diameter. Second pathologic node just lateral to the jugular vein  on the right at the level 2 level 3 junction measuring 14 x 11 mm. Electronically Signed   By: Paulina Fusi M.D.   On: 06/08/2023 16:13    Labs:  CBC: Recent Labs    08/13/22 0923 06/24/23 1428  WBC 6.1 5.4  HGB 14.2 13.8  HCT 41.1 41.6  PLT 149* 125*    COAGS: No results for input(s): "INR", "APTT" in the last 8760 hours.  BMP: Recent Labs    08/13/22 0923 06/24/23 1418  NA 138 138  K 4.1 4.5  CL 103 104  CO2 26 26  GLUCOSE 162* 167*  BUN 13 18  CALCIUM 9.5 8.8*  CREATININE 0.88 0.93  GFRNONAA >60 >60    LIVER FUNCTION TESTS: Recent Labs    08/13/22 0923 06/24/23 1418  BILITOT 1.1 0.7  AST 19 14*  ALT 6 7  ALKPHOS 42 32*  PROT 7.2 6.6  ALBUMIN 4.4 4.1    TUMOR MARKERS: No results for input(s): "AFPTM", "CEA", "CA199", "CHROMGRNA" in the last 8760 hours.  Assessment and Plan:  Squamous cell carcinoma of the oropharynx; pending chemotherapy: Liston Alba., 68 year old male, presents today to the [facility] Interventional Radiology department for an image-guided port-a-catheter placement.   Risks and benefits of image-guided Port-a-catheter placement were discussed with the patient including, but not limited to bleeding, infection, pneumothorax, or fibrin sheath development and need for additional  procedures.  All of the patient's questions were answered, patient is agreeable to proceed. He has been NPO. He is a full code.   Consent signed and in chart.  Thank you for this interesting consult.  I greatly enjoyed meeting Collen Vincent and look forward to participating in their care.  A copy of this report was sent to the requesting provider on this date.  Electronically Signed: Alwyn Ren, AGACNP-BC 06/30/2023, 1:31 PM   I spent a total of  30 Minutes   in face to face in clinical consultation, greater than 50% of which was counseling/coordinating care for port-a-catheter placement.

## 2023-06-30 NOTE — OR Nursing (Signed)
 Post procedure noted itchy red rash on both cheeks moving around ears and to chest. It follows the line of the etco2 tubing which is latex free. Added to allergies

## 2023-07-01 ENCOUNTER — Telehealth: Payer: Self-pay | Admitting: *Deleted

## 2023-07-01 ENCOUNTER — Telehealth (INDEPENDENT_AMBULATORY_CARE_PROVIDER_SITE_OTHER): Payer: Medicare Other | Admitting: Psychiatry

## 2023-07-01 ENCOUNTER — Telehealth: Payer: Self-pay

## 2023-07-01 ENCOUNTER — Encounter: Payer: Self-pay | Admitting: Psychiatry

## 2023-07-01 DIAGNOSIS — F331 Major depressive disorder, recurrent, moderate: Secondary | ICD-10-CM

## 2023-07-01 DIAGNOSIS — G4700 Insomnia, unspecified: Secondary | ICD-10-CM

## 2023-07-01 DIAGNOSIS — F411 Generalized anxiety disorder: Secondary | ICD-10-CM | POA: Diagnosis not present

## 2023-07-01 MED ORDER — CLONAZEPAM 1 MG PO TABS: ORAL_TABLET | ORAL | 1 refills | Status: DC

## 2023-07-01 NOTE — Progress Notes (Signed)
 Virtual Visit via Video Note  I connected with Ricky [name] on 07/01/23 at  3:30 PM EST by a video enabled telemedicine application and verified that I am speaking with the correct person using two identifiers.  Location: Patient: home Provider: office Persons participated in the visit- patient, provider    I discussed the limitations of evaluation and management by telemedicine and the availability of in person appointments. The patient expressed understanding and agreed to proceed.   I discussed the assessment and treatment plan with the patient. The patient was provided an opportunity to ask questions and all were answered. The patient agreed with the plan and demonstrated an understanding of the instructions.   The patient was advised to call back or seek an in-person evaluation if the symptoms worsen or if the condition fails to improve as anticipated.   Neysa Hotter, MD    [facility]/NP OP Progress Note  07/01/2023 5:38 PM Dione Mccombie  MRN:  [id number]  Chief Complaint:  Chief Complaint  Patient presents with   Follow-up   HPI:  This is a follow-up appointment for depression, anxiety, insomnia.  He states that it has been hard.  Although the cancer diagnosis did not bother him at first, it has been getting worse.  He is awaiting to obtain PET scan.  He tends to be worried about cancer.  His mind is racing.  Although there might be some movement of him not being worried, it does not last long.  He denies any violent images or compulsions.  He reports significant anxiety since 68 year old.  His provider reportedly put him on Xanax, which was later replaced to clonazepam.  He has been reportedly on the same dose, 1 mg TID for 40 years.  He has insomnia due to constant worry.  Although trazodone helps to sleep a little better, his mind is not rested.  He has decrease in appetite, and has dysphagia.  He tries to drink soup.  [name] is trying to make him smoothly with Ensure.  He  denies SI.  He denies hallucinations.  He denies any fall or dizziness.  Although the appointment with a therapist need to be rescheduled due to cancer treatment, he is willing to try this when the schedule becomes available.   141/75 HR 71 yesterday 129/77 HR 96 today   Visit Diagnosis:    ICD-10-CM   1. MDD (major depressive disorder), recurrent episode, moderate (HCC)  F33.1     2. Anxiety state  F41.1     3. Insomnia, unspecified type  G47.00       Past Psychiatric History: Please see initial evaluation for full details. I have reviewed the history. No updates at this time.     Past Medical History:  Past Medical History:  Diagnosis Date   Anxiety    Arthritis    Carpal tunnel syndrome    Depression    Diabetes (HCC)    Hyperlipidemia    Hypertension    Loss of teeth due to extraction    Parkinson's disease [facility])     Past Surgical History:  Procedure Laterality Date   CARPAL TUNNEL RELEASE Right 11/28/2020   Procedure: CARPAL TUNNEL RELEASE ENDOSCOPIC;  Surgeon: Christena Flake, MD;  Location: [facility] ORS;  Service: Orthopedics;  Laterality: Right;   CARPAL TUNNEL RELEASE Left 01/22/2021   Procedure: CARPAL TUNNEL RELEASE ENDOSCOPIC;  Surgeon: Christena Flake, MD;  Location: [facility] ORS;  Service: Orthopedics;  Laterality: Left;   IR IMAGING GUIDED  PORT INSERTION  06/30/2023   KNEE ARTHROSCOPY Right     Family Psychiatric History: Please see initial evaluation for full details. I have reviewed the history. No updates at this time.     Family History:  Family History  Problem Relation Age of Onset   Diabetes Mother    Hypertension Mother    CVA Father    Heart attack Father    Depression Sister    Depression Brother     Social History:  Social History   Socioeconomic History   Marital status: Single    Spouse name: [name]   Number of children: Not on file   Years of education: Not on file   Highest education level: Not on file  Occupational History   Not on file   Tobacco Use   Smoking status: Never   Smokeless tobacco: Never  Vaping Use   Vaping status: Never Used  Substance and Sexual Activity   Alcohol use: Not Currently   Drug use: Never   Sexual activity: Not Currently    Birth control/protection: None  Other Topics Concern   Not on file  Social History Narrative   Lives with friend   Social Drivers of Health   Financial Resource Strain: Not on file  Food Insecurity: No Food Insecurity (06/22/2023)   Hunger Vital Sign    Worried About Running Out of Food in the Last Year: Never true    Ran Out of Food in the Last Year: Never true  Transportation Needs: No Transportation Needs (06/22/2023)   PRAPARE - Administrator, Civil Service (Medical): No    Lack of Transportation (Non-Medical): No  Physical Activity: Not on file  Stress: Not on file  Social Connections: Not on file    Allergies:  Allergies  Allergen Reactions   Mirtazapine Rash   Other Itching and Rash    ETCO2 tubing. Medtronic Microstream advance    Metabolic Disorder Labs: Lab Results  Component Value Date   HGBA1C 5.2 08/25/2021   MPG 102.54 08/25/2021   No results found for: "PROLACTIN" No results found for: "CHOL", "TRIG", "HDL", "CHOLHDL", "VLDL", "LDLCALC" Lab Results  Component Value Date   TSH 2.715 08/13/2022   TSH 3.886 08/25/2021    Therapeutic Level Labs: No results found for: "LITHIUM" No results found for: "VALPROATE" No results found for: "CBMZ"  Current Medications: Current Outpatient Medications  Medication Sig Dispense Refill   clonazePAM (KLONOPIN) 1 MG tablet May take 1 tablet (1 mg total) by mouth 2 (two) times daily as needed for anxiety. May also take 0.5-1 tablets (0.5-1 mg total) at bedtime as needed for anxiety. 90 tablet 1   atenolol (TENORMIN) 50 MG tablet Take 50 mg by mouth daily.     buPROPion (WELLBUTRIN XL) 150 MG 24 hr tablet Take 1 tablet (150 mg total) by mouth daily. Total of 450 mg daily. Take along  with 300 mg tab 30 tablet 5   buPROPion (WELLBUTRIN XL) 300 MG 24 hr tablet Take 1 tablet (300 mg total) by mouth daily. Total of 450 mg daily. Take along with 150 mg tab 30 tablet 5   busPIRone (BUSPAR) 7.5 MG tablet Take 1 tablet (7.5 mg total) by mouth 2 (two) times daily. 60 tablet 3   carbidopa-levodopa (SINEMET IR) 25-100 MG tablet Take 1 tablet by mouth 5 (five) times daily. 3 in morning 2 in evening     fentaNYL (DURAGESIC) 12 MCG/HR Place 1 patch onto the skin every 3 (  three) days. 10 patch 0   ketoconazole (NIZORAL) 2 % shampoo Apply 1 application topically every other day. (Patient not taking: Reported on 06/22/2023)     metFORMIN (GLUCOPHAGE) 500 MG tablet Take 500 mg by mouth daily with breakfast.     methylphenidate (RITALIN) 5 MG tablet Take 1 tablet (5 mg total) by mouth daily. (Patient not taking: Reported on 06/24/2023) 30 tablet 0   oxyCODONE (OXY IR/ROXICODONE) 5 MG immediate release tablet Take 1-2 tablets (5-10 mg total) by mouth every 4 (four) hours as needed for severe pain (pain score 7-10).     rosuvastatin (CRESTOR) 20 MG tablet Take 20 mg by mouth daily.     sertraline (ZOLOFT) 100 MG tablet Take 1.5 tablets (150 mg total) by mouth at bedtime. 45 tablet 3   sildenafil (VIAGRA) 50 MG tablet Take 50 mg by mouth daily as needed for erectile dysfunction.     tadalafil (CIALIS) 5 MG tablet Take 5 mg by mouth daily.     tamsulosin (FLOMAX) 0.4 MG CAPS capsule Take by mouth.     traZODone (DESYREL) 50 MG tablet Take 0.5-2 tablets (25-100 mg total) by mouth at bedtime as needed for sleep. 60 tablet 1   VICTOZA 18 MG/3ML SOPN Inject 1.2 mg into the skin daily. (Patient not taking: Reported on 06/22/2023)     No current facility-administered medications for this visit.     Musculoskeletal: Strength & Muscle Tone:  N/A Gait & Station:  N/A Patient leans: N/A  Psychiatric Specialty Exam: Review of Systems  Psychiatric/Behavioral:  Positive for dysphoric mood and sleep  disturbance. Negative for agitation, behavioral problems, confusion, decreased concentration, hallucinations, self-injury and suicidal ideas. The patient is nervous/anxious. The patient is not hyperactive.   All other systems reviewed and are negative.   There were no vitals taken for this visit.There is no height or weight on file to calculate BMI.  General Appearance: Well Groomed  Eye Contact:  Good  Speech:  Clear and Coherent  Volume:  Normal  Mood:  Anxious  Affect:  Appropriate, Congruent, and slightly down  Thought Process:  Coherent  Orientation:  Full (Time, Place, and Person)  Thought Content: Logical   Suicidal Thoughts:  No  Homicidal Thoughts:  No  Memory:  Immediate;   Good  Judgement:  Good  Insight:  Good  Psychomotor Activity:  Normal  Concentration:  Concentration: Good and Attention Span: Good  Recall:  Good  Fund of Knowledge: Good  Language: Good  Akathisia:  No  Handed:  Right  AIMS (if indicated): not done  Assets:  Communication Skills Desire for Improvement  ADL's:  Intact  Cognition: WNL  Sleep:  Poor   Screenings: ECT-MADRS    Flowsheet Row ECT Treatment from 07/28/2021 in [facility]  MADRS Total Score 41      GAD-7    Flowsheet Row Office Visit from 02/25/2023 in [facility] Office Visit from 01[facility]07/2023 in [facility] Office Visit from 10/12/2022 in [facility] Office Visit from 06/16/2022 in [facility] Office Visit from 03/31/2022 in [facility]  Total GAD-7 Score 18 18 12 20 21       Mini-Mental    Flowsheet Row ECT Treatment from 07/28/2021 in [facility]  Total Score (max 30 points ) 30      PHQ2-9    Flowsheet  Row Office Visit from 06/22/2023 in [facility] [place] Office Visit from 02/25/2023 in [facility] Office Visit from 01/07/2023 in [facility] Office Visit from 10/12/2022 in [facility] Office Visit from 06/16/2022 in [facility]  PHQ-2 Total Score 6 5 6 4 6   PHQ-9 Total Score -- 17 22 11 18       Flowsheet Row Office Visit from 03/31/2022 in [facility] Office Visit from 02/09/2022 in [facility] Office Visit from 12/22/2021 in [facility]  C-SSRS RISK CATEGORY No Risk No Risk No Risk        Assessment and Plan:  Rulon Abdalla is a 68 y.o. year old male with a history of depression, anxiety, parkinsonism on levodopa, diabetes, hypertension, hyperlipidemia, carpal tunnel syndrome, s/p knee arthroscopy , who presents for follow up appointment for below.   1. MDD (major depressive disorder), recurrent episode, moderate (HCC) 2. Anxiety state R/o OCD Acute stressors include: loss of his father with liver cancer Feb 2024, undergoing evaluation of necrotic mass at the glossotonsillar sulcus Other stressors include: unemployment   History: anxiety since age 18 . depression since 1979, ECT in 2023 with limited benefit, originally on duloxetine 60 mg daily, bupropion 150 mg daily Abilify 30 mg daily        Unstable.  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  Treatment option is very limited due to history of Parkinson.  Given he reports significant benefit from higher frequency of clonazepam, we will get back on this medication for now to support him through cancer treatment. The goal is to taper down  this medication in the future, especially as he has an upcoming appointment with a therapist.  Will continue sertraline to target depression and anxiety , and bupropion as adjunctive treatment for depression.  Will continue BuSpar for anxiety.   Noted that he was previously referred and was accepted for TMS treatment.  Which is on hold due to the current medical condition. Also noted that his facial expression and energy appear slightly more vital since discontinuing Abilify, although both he and his wife deny noticing any subjective change. Will plan to hold off on initiating any antipsychotic at this time.  # benzodiazepine dependence (prescribed) - tapered down from 1 mg TID since April 2023, which was uptitrated back Feb 2025 Although he was adjusting well to tapering down medication 2 years ago, his anxiety has worsened in the setting of cancer diagnosis.  The dose will be up titrated as outlined above.  Discussed potential risks, which includes fall, drowsiness, and respiratory suppression with concomitant use of opioid.  He agrees to contact the office if he experiences any of these symptoms.     # Insomnia 3. Fatigue, unspecified type 4. Obstructive sleep apnea # vitamin D deficiency - According to the recent sleep study, he has severe obstructive sleep apnea, AHI of 31.  - Ritalin 5 mg did not make any difference, but cause adverse reaction of restlessness.  Pending sleep study.  He reports slight benefit in sleep since starting trazodone.  Will continue current dose for now to target insomnia.     5. Cognitive deficits Functional Status  IADL: Independent in the following:driving (advised to refrain from driving 0/[id number])           Requires assistance with the following:  managing finances, medications (using pill box, his wife checks) ADL  Independent in the following: bathing and hygiene, feeding, continence, grooming and toileting, walking          Requires assistance with the  following: Folate, Vitamin B12, TSH- low folic acid 08/2022. Otherwise wnl Images Brain MRI 08/2021: There is no evidence of an acute infarct, intracranial hemorrhage, mass, midline shift, or extra-axial fluid collection.Small T2 hyperintensities in the cerebral white matter bilaterally are nonspecific but compatible with mild chronic small vessel ischemic disease. Dilated perivascular spaces are noted in the basal ganglia bilaterally. There is mild generalized cerebral atrophy. Neuropsych assessment:  Etiology: parkinson, r/o VaD   He denies any subjective symptoms of memory loss.  However, his IADL is somewhat limited. Will plan to do further evaluation in the future visit.    Plan  Continue sertraline 150 mg at night (limited benefit from higher dose) Continue bupropion 450 mg daily Continue Buspar 7.5 mg twice a day (restless leg from 10 mg BID) Continue trazodone 100 mg at night as needed for insomnia Increase clonazepam 1 mg twice a day, and 0.5-1 mg at night as needed for anxiety  Next appointment-  4/1 at 11:30, IP They are advised to contact the sleep clinic regarding the result - TSH- 4.238 10/2022   Past trials of medication (the following medication caused either side effect, or limited benefit): lexapro, duloxetine, venlafaxine, mirtazapine (rash), Abilify, quetiapine (insomnia, RLS)   The patient demonstrates the following risk factors for suicide: Chronic risk factors for suicide include: psychiatric disorder of depression and chronic pain. Acute risk factors for suicide include: unemployment. Protective factors for this patient include: positive social support, coping skills and hope for the future. Considering these factors, the overall suicide risk at this point appears to be low. Patient is appropriate for outpatient follow up.  A total of 40 minutes was spent on the following activities during the encounter date, which includes but is not limited to: preparing to see the  patient (e.g., reviewing tests and records), obtaining and/or reviewing separately obtained history, performing a medically necessary examination or evaluation, counseling and educating the patient, family, or caregiver, ordering medications, tests, or procedures, referring and communicating with other healthcare professionals (when not reported separately), documenting clinical information in the electronic or paper health record, independently interpreting test or lab results and communicating these results to the family or caregiver, and coordinating care (when not reported separately).   Collaboration of Care: Collaboration of Care: Other reviewed notes in Epic, communicates with his providers to notify the change in his medication  Patient/Guardian was advised Release of Information must be obtained prior to any record release in order to collaborate their care with an outside provider. Patient/Guardian was advised if they have not already done so to contact the registration department to sign all necessary forms in order for [place] to release information regarding their care.   Consent: Patient/Guardian gives verbal consent for treatment and assignment of benefits for services provided during this visit. Patient/Guardian expressed understanding and agreed to proceed.    Neysa Hotter, MD 07/01/2023, 5:38 PM

## 2023-07-01 NOTE — Telephone Encounter (Signed)
-----   Message from Chriss Czar sent at 06/30/2023  9:21 AM EST ----- Regarding: PRIOR AUTH FOLLOW UP- COVER MY MEDS PRIOR AUTH FOLLOW UP- COVER MY MEDS sent to his chart.

## 2023-07-01 NOTE — Telephone Encounter (Signed)
 How many oxycodone pills he is using per day? We can only refill if it is within 3 days accounting for the weekend

## 2023-07-01 NOTE — Telephone Encounter (Signed)
 Patient needs a non-formulary exception request form completed for insurance purposes. Form completed. Will need Dr. Assunta Gambles signature

## 2023-07-01 NOTE — Telephone Encounter (Signed)
 Medication management - Telephone call with patient's [facility] to inform Dr. Vanetta Shawl sent in a new Clonazepam 1 mg order for patient today, #90 per month and requested pharmacist cancel out all other past prescriptions, per Dr. Bing Matter request.  Spoke to [place], the pharmacist who verified she had received the new order and cancelled out all the previous Clonazepam prescriptions.

## 2023-07-01 NOTE — Telephone Encounter (Signed)
 I spoke to wife about where they go for the education for treatment and that they may have to leave a few minutes early because he has scan at 1 pm. I think they will have time to get to the scan. Then the wife says that she needs refill of oxycodone and he is putting the patch on this morning. I asked if how many of the oxycodone pills does he have right now and she counted them and it was 30 pills and I told her that I did not think it is jsut refill right now. Wife states that the patient is very anxious and if he gets down to a small amount his anxiety gets worse. I told her that I will send message to you.

## 2023-07-02 ENCOUNTER — Ambulatory Visit
Admission: RE | Admit: 2023-07-02 | Discharge: 2023-07-02 | Disposition: A | Discharge status: Home / Self Care | Payer: Medicare Other | Source: Ambulatory Visit | Attending: Oncology | Admitting: Oncology

## 2023-07-02 ENCOUNTER — Inpatient Hospital Stay: Payer: Medicare Other

## 2023-07-02 ENCOUNTER — Other Ambulatory Visit: Payer: Self-pay | Admitting: Hospice and Palliative Medicine

## 2023-07-02 DIAGNOSIS — C771 Secondary and unspecified malignant neoplasm of intrathoracic lymph nodes: Secondary | ICD-10-CM | POA: Diagnosis not present

## 2023-07-02 DIAGNOSIS — C109 Malignant neoplasm of oropharynx, unspecified: Secondary | ICD-10-CM | POA: Diagnosis present

## 2023-07-02 LAB — GLUCOSE, CAPILLARY: Glucose-Capillary: 106 mg/dL — ABNORMAL HIGH (ref 70–99)

## 2023-07-02 MED ORDER — FLUDEOXYGLUCOSE F - 18 (FDG) INJECTION
9.7100 | Freq: Once | INTRAVENOUS | Status: AC | PRN
  Administered 2023-07-02: 9.71 via INTRAVENOUS

## 2023-07-03 ENCOUNTER — Encounter: Payer: Self-pay | Admitting: Hospice and Palliative Medicine

## 2023-07-04 ENCOUNTER — Other Ambulatory Visit: Payer: Self-pay | Admitting: Oncology

## 2023-07-04 MED ORDER — OXYCODONE HCL 10 MG PO TABS: 10.0000 mg | ORAL_TABLET | ORAL | 0 refills | Status: DC | PRN

## 2023-07-05 NOTE — Telephone Encounter (Signed)
 The order was sent at his last visit. Could you verify the pharmacy and update the patient accordingly? Thanks

## 2023-07-05 NOTE — Telephone Encounter (Signed)
 Form given to [facility] last week to obtain Dr. Assunta Gambles signature

## 2023-07-05 NOTE — Telephone Encounter (Signed)
 Looks like Dr. [name] sent script over the weekend

## 2023-07-05 NOTE — Telephone Encounter (Signed)
 Spoke to patients spouse she stated that the pharmacy did not have the right prescription which should say the patient could take up to 3 tables per day of the Clonazepam 1 mg called the pharmacy to verify what they had spoke to Diazian sent Dr. Vanetta Shawl a message to verify what she was wanting the patient to take called the spouse back to make aware of what the prescription says she voiced understanding and will pick the medication up when the pharmacy alert her that it is ready

## 2023-07-06 ENCOUNTER — Other Ambulatory Visit: Payer: Self-pay | Admitting: Oncology

## 2023-07-06 ENCOUNTER — Telehealth: Payer: Self-pay | Admitting: *Deleted

## 2023-07-06 ENCOUNTER — Encounter: Payer: Self-pay | Admitting: *Deleted

## 2023-07-06 DIAGNOSIS — C109 Malignant neoplasm of oropharynx, unspecified: Secondary | ICD-10-CM

## 2023-07-06 MED ORDER — ONDANSETRON HCL 8 MG PO TABS: 8.0000 mg | ORAL_TABLET | Freq: Three times a day (TID) | ORAL | 1 refills | Status: AC | PRN

## 2023-07-06 MED ORDER — PROCHLORPERAZINE MALEATE 10 MG PO TABS: 10.0000 mg | ORAL_TABLET | Freq: Four times a day (QID) | ORAL | 1 refills | Status: DC | PRN

## 2023-07-06 MED ORDER — LIDOCAINE-PRILOCAINE 2.5-2.5 % EX CREA
TOPICAL_CREAM | CUTANEOUS | 3 refills | Status: AC
Start: 2023-07-06 — End: ?

## 2023-07-06 MED ORDER — DEXAMETHASONE 4 MG PO TABS: ORAL_TABLET | ORAL | 1 refills | Status: DC

## 2023-07-06 NOTE — Telephone Encounter (Signed)
 I filled out the questions for the EMLA cream and Megan asked the questions and we should get the answer in by 2/28, the case # [id number]. They are suppose to call us also

## 2023-07-06 NOTE — Progress Notes (Signed)
START ON PATHWAY REGIMEN - Head and Neck     A cycle is every 7 days:     Cisplatin   **Always confirm dose/schedule in your pharmacy ordering system**  Patient Characteristics: Oropharynx, HPV Positive, Preoperative or Nonsurgical Candidate (Clinical Staging), cT0-4, cN1-3 or cT3-4, cN0 Disease Classification: Oropharynx HPV Status: Positive (+) Therapeutic Status: Preoperative or Nonsurgical Candidate (Clinical Staging) AJCC T Category: cT3 AJCC 8 Stage Grouping: II AJCC N Category: cN1 AJCC M Category: cM0 Intent of Therapy: Curative Intent, Discussed with Patient 

## 2023-07-07 ENCOUNTER — Ambulatory Visit: Payer: Medicare Other

## 2023-07-07 ENCOUNTER — Telehealth: Payer: Self-pay

## 2023-07-07 ENCOUNTER — Other Ambulatory Visit: Payer: Self-pay

## 2023-07-07 DIAGNOSIS — Z510 Encounter for antineoplastic radiation therapy: Secondary | ICD-10-CM | POA: Diagnosis not present

## 2023-07-07 NOTE — Telephone Encounter (Signed)
 Spoke to plan regarding lidocaine cream stated no PA or anything needed on our end. Plan is aware and will make a decision within 72 hr if approved or not

## 2023-07-07 NOTE — Telephone Encounter (Signed)
 Received notification appeal needed for fentanyl patches due to error in quantity. Expedited appeal completed via phone with [employer] rep with correct RX details. Will receive notification of decision in 3-5 days   Ref # [id number]

## 2023-07-07 NOTE — Progress Notes (Signed)
 Pharmacist Chemotherapy Monitoring - Initial Assessment    Anticipated start date: 07/12/23   The following has been reviewed per standard work regarding the patient's treatment regimen: The patient's diagnosis, treatment plan and drug doses, and organ/hematologic function Lab orders and baseline tests specific to treatment regimen  The treatment plan start date, drug sequencing, and pre-medications Prior authorization status  Patient's documented medication list, including drug-drug interaction screen and prescriptions for anti-emetics and supportive care specific to the treatment regimen The drug concentrations, fluid compatibility, administration routes, and timing of the medications to be used The patient's access for treatment and lifetime cumulative dose history, if applicable  The patient's medication allergies and previous infusion related reactions, if applicable   Changes made to treatment plan:  N/A  Follow up needed:  N/A   Sharen Hones, RPH, 07/07/2023  1:32 PM

## 2023-07-08 ENCOUNTER — Encounter: Payer: Self-pay | Admitting: Oncology

## 2023-07-08 ENCOUNTER — Ambulatory Visit
Admission: RE | Admit: 2023-07-08 | Discharge: 2023-07-08 | Disposition: A | Discharge status: Home / Self Care | Payer: Medicare Other | Source: Ambulatory Visit | Attending: Radiation Oncology | Admitting: Radiation Oncology

## 2023-07-08 ENCOUNTER — Telehealth: Payer: Self-pay | Admitting: *Deleted

## 2023-07-08 ENCOUNTER — Ambulatory Visit: Payer: Medicare Other

## 2023-07-08 NOTE — Telephone Encounter (Signed)
 Who looks into chemo/RT approval? Is it approved or not?

## 2023-07-08 NOTE — Telephone Encounter (Signed)
 I [facility] and let the staff know that it is ok for him to have both. I called to ask Ricky [name] about this and she says that it is ok for the patch fentanyl and he can have clonazepam also. I spoke to Ricky [name] and let him know that the patient can have both.

## 2023-07-08 NOTE — Telephone Encounter (Signed)
 The wife states and make it approved with chemo and radiation also

## 2023-07-09 ENCOUNTER — Telehealth: Payer: Self-pay | Admitting: *Deleted

## 2023-07-09 ENCOUNTER — Ambulatory Visit: Payer: Medicare Other

## 2023-07-09 ENCOUNTER — Telehealth: Payer: Self-pay

## 2023-07-09 ENCOUNTER — Telehealth: Payer: Self-pay | Admitting: Hospice and Palliative Medicine

## 2023-07-09 DIAGNOSIS — C109 Malignant neoplasm of oropharynx, unspecified: Secondary | ICD-10-CM

## 2023-07-09 MED ORDER — FENTANYL 25 MCG/HR TD PT72: 1.0000 | MEDICATED_PATCH | TRANSDERMAL | 0 refills | Status: DC

## 2023-07-09 MED FILL — Fosaprepitant Dimeglumine For IV Infusion 150 MG (Base Eq): INTRAVENOUS | Qty: 5 | Status: AC

## 2023-07-09 NOTE — Telephone Encounter (Signed)
 I called the wife and let her know that the chemo and the radiation has been approved and Josh NP says that he can take 1-2 tablets of oxycodone if needed and he put in for 25 mcg fentanylpaches will be coming on Monday after 12 oclock per pharmacy

## 2023-07-09 NOTE — Telephone Encounter (Signed)
 I spoke with his wife regarding patient's overall pain.  He is currently sleeping.  She increased fentanyl to 25 mcg yesterday evening using two patches.  She thinks pain might be slightly improved today, although she has continued to give patient oxycodone regularly and every 4 hour intervals.  Will go ahead and send in new prescription for fentanyl patches at 25 mcg dose.  Wife was tearful as she described problems with her insurance approving patient's chemotherapy.  Discussed with Dr. Assunta Gambles team who is working on this issue.  Patient's appetite remains poor.  Dr. [name] has discussed with Dr. Everlene Farrier regarding placement of PEG. Will refer to nutrition.

## 2023-07-09 NOTE — Telephone Encounter (Signed)
 Spoke to [employer] regarding appeal requested. Noralyn Pick rep stated dismissal of appeal for fentanyl patch. Quantity approved nothing else needed on our end  Conf # [id number]

## 2023-07-09 NOTE — Telephone Encounter (Signed)
 Wife calling about the treatment plan is not been approved.

## 2023-07-12 ENCOUNTER — Other Ambulatory Visit: Payer: Self-pay | Admitting: Hospice and Palliative Medicine

## 2023-07-12 ENCOUNTER — Inpatient Hospital Stay (HOSPITAL_BASED_OUTPATIENT_CLINIC_OR_DEPARTMENT_OTHER): Payer: Medicare Other | Admitting: Oncology

## 2023-07-12 ENCOUNTER — Telehealth: Payer: Self-pay | Admitting: *Deleted

## 2023-07-12 ENCOUNTER — Other Ambulatory Visit: Payer: Self-pay

## 2023-07-12 ENCOUNTER — Encounter: Payer: Self-pay | Admitting: Oncology

## 2023-07-12 ENCOUNTER — Ambulatory Visit: Payer: Medicare Other | Admitting: Surgery

## 2023-07-12 ENCOUNTER — Ambulatory Visit
Admission: RE | Admit: 2023-07-12 | Discharge: 2023-07-12 | Disposition: A | Discharge status: Home / Self Care | Payer: Medicare Other | Source: Ambulatory Visit | Attending: Radiation Oncology | Admitting: Radiation Oncology

## 2023-07-12 ENCOUNTER — Inpatient Hospital Stay: Payer: Medicare Other | Attending: Oncology

## 2023-07-12 ENCOUNTER — Inpatient Hospital Stay: Payer: Medicare Other

## 2023-07-12 VITALS — BP 131/77 | HR 104 | Temp 97.1°F | Resp 18

## 2023-07-12 VITALS — BP 136/81 | HR 104 | Temp 96.5°F | Resp 19 | Wt 170.8 lb

## 2023-07-12 DIAGNOSIS — Z5111 Encounter for antineoplastic chemotherapy: Secondary | ICD-10-CM

## 2023-07-12 DIAGNOSIS — C091 Malignant neoplasm of tonsillar pillar (anterior) (posterior): Secondary | ICD-10-CM | POA: Diagnosis not present

## 2023-07-12 DIAGNOSIS — Z79630 Long term (current) use of alkylating agent: Secondary | ICD-10-CM | POA: Diagnosis not present

## 2023-07-12 DIAGNOSIS — G893 Neoplasm related pain (acute) (chronic): Secondary | ICD-10-CM | POA: Insufficient documentation

## 2023-07-12 DIAGNOSIS — C109 Malignant neoplasm of oropharynx, unspecified: Secondary | ICD-10-CM

## 2023-07-12 DIAGNOSIS — F32A Depression, unspecified: Secondary | ICD-10-CM | POA: Insufficient documentation

## 2023-07-12 DIAGNOSIS — F419 Anxiety disorder, unspecified: Secondary | ICD-10-CM | POA: Insufficient documentation

## 2023-07-12 DIAGNOSIS — Z510 Encounter for antineoplastic radiation therapy: Secondary | ICD-10-CM | POA: Diagnosis not present

## 2023-07-12 LAB — BASIC METABOLIC PANEL - CANCER CENTER ONLY
Anion gap: 11 (ref 5–15)
BUN: 15 mg/dL (ref 8–23)
CO2: 28 mmol/L (ref 22–32)
Calcium: 9.2 mg/dL (ref 8.9–10.3)
Chloride: 99 mmol/L (ref 98–111)
Creatinine: 1 mg/dL (ref 0.61–1.24)
GFR, Estimated: >60 mL/min (ref >60)
Glucose, Bld: 163 mg/dL — ABNORMAL HIGH (ref 70–99)
Potassium: 4.2 mmol/L (ref 3.5–5.1)
Sodium: 138 mmol/L (ref 135–145)

## 2023-07-12 LAB — CBC WITH DIFFERENTIAL (CANCER CENTER ONLY)
Abs Immature Granulocytes: 0.03 10*3/uL (ref 0.00–0.07)
Basophils Absolute: 0.1 10*3/uL (ref 0.0–0.1)
Basophils Relative: 1 %
Eosinophils Absolute: 0.1 10*3/uL (ref 0.0–0.5)
Eosinophils Relative: 1 %
HCT: 42.4 % (ref 39.0–52.0)
Hemoglobin: 14.3 g/dL (ref 13.0–17.0)
Immature Granulocytes: 0 %
Lymphocytes Relative: 17 %
Lymphs Abs: 1.2 10*3/uL (ref 0.7–4.0)
MCH: 30 pg (ref 26.0–34.0)
MCHC: 33.7 g/dL (ref 30.0–36.0)
MCV: 88.9 fL (ref 80.0–100.0)
Monocytes Absolute: 0.6 10*3/uL (ref 0.1–1.0)
Monocytes Relative: 8 %
Neutro Abs: 5 10*3/uL (ref 1.7–7.7)
Neutrophils Relative %: 73 %
Platelet Count: 195 10*3/uL (ref 150–400)
RBC: 4.77 MIL/uL (ref 4.22–5.81)
RDW: 13 % (ref 11.5–15.5)
WBC Count: 6.9 10*3/uL (ref 4.0–10.5)
nRBC: 0 % (ref 0.0–0.2)

## 2023-07-12 LAB — RAD ONC ARIA SESSION SUMMARY
Course Elapsed Days: 0
Plan Fractions Treated to Date: 1
Plan Prescribed Dose Per Fraction: 2 Gy
Plan Total Fractions Prescribed: 35
Plan Total Prescribed Dose: 70 Gy
Reference Point Dosage Given to Date: 2 Gy
Reference Point Session Dosage Given: 2 Gy
Session Number: 1

## 2023-07-12 LAB — MAGNESIUM: Magnesium: 1.7 mg/dL (ref 1.7–2.4)

## 2023-07-12 MED ORDER — MAGNESIUM SULFATE 2 GM/50ML IV SOLN
2.0000 g | Freq: Once | INTRAVENOUS | Status: AC
  Administered 2023-07-12: 2 g via INTRAVENOUS
  Filled 2023-07-12: qty 50

## 2023-07-12 MED ORDER — OXYCODONE HCL 10 MG PO TABS: 10.0000 mg | ORAL_TABLET | ORAL | 0 refills | Status: DC | PRN

## 2023-07-12 MED ORDER — POTASSIUM CHLORIDE IN NACL 20-0.9 MEQ/L-% IV SOLN
Freq: Once | INTRAVENOUS | Status: AC
Start: 2023-07-12 — End: 2023-07-12

## 2023-07-12 MED ORDER — DEXAMETHASONE SODIUM PHOSPHATE 10 MG/ML IJ SOLN
10.0000 mg | Freq: Once | INTRAMUSCULAR | Status: AC
  Administered 2023-07-12: 10 mg via INTRAVENOUS
  Filled 2023-07-12: qty 1

## 2023-07-12 MED ORDER — HEPARIN SOD (PORK) LOCK FLUSH 100 UNIT/ML IV SOLN
500.0000 [IU] | Freq: Once | INTRAVENOUS | Status: AC | PRN
  Administered 2023-07-12: 500 [IU]
  Filled 2023-07-12: qty 5

## 2023-07-12 MED ORDER — PALONOSETRON HCL INJECTION 0.25 MG/5ML
0.2500 mg | Freq: Once | INTRAVENOUS | Status: AC
  Administered 2023-07-12: 0.25 mg via INTRAVENOUS
  Filled 2023-07-12: qty 5

## 2023-07-12 MED ORDER — SODIUM CHLORIDE 0.9 % IV SOLN
INTRAVENOUS | Status: DC
  Filled 2023-07-12: qty 250

## 2023-07-12 MED ORDER — SODIUM CHLORIDE 0.9 % IV SOLN
Freq: Once | INTRAVENOUS | Status: AC
  Filled 2023-07-12: qty 250

## 2023-07-12 MED ORDER — SODIUM CHLORIDE 0.9 % IV SOLN
150.0000 mg | Freq: Once | INTRAVENOUS | Status: AC
  Administered 2023-07-12: 150 mg via INTRAVENOUS
  Filled 2023-07-12: qty 150

## 2023-07-12 MED ORDER — CISPLATIN CHEMO INJECTION 100MG/100ML
40.0000 mg/m2 | Freq: Once | INTRAVENOUS | Status: AC
  Administered 2023-07-12: 79 mg via INTRAVENOUS
  Filled 2023-07-12: qty 79

## 2023-07-12 NOTE — Progress Notes (Addendum)
 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  He was ultimately referred By his dentist to ENT and underwent CT soft tissue neck which showed a 4 cm partially necrotic mass at the glossotonsillar sulcus on the right side and to level 2 lymph nodes measuring 15 and 14 mm that appeared pathologic.  Core needle biopsy of the level 2 lymph node was consistent with squamous cell carcinoma.  Cells diffusely positive for p40 and p16.  Plan is to proceed with concurrent weekly cisplatin chemotherapy with radiation  Interval history-patient is here with his wife today.  Patient's wife is concerned that he is more slow in his thoughts and often frustrated.  Patient himself rates his pain as 3 out of 10.  He is using fentanyl patch 25 mcg and 1 to 2 tablets of oxycodone 10 mg every 4 hours as needed.  Bowel movements are regular.  ECOG PS- 1 Pain scale- 3 Opioid associated constipation- no  Review of systems- Review of Systems  Constitutional:  Positive for malaise/fatigue. Negative for chills, fever and weight loss.  HENT:  Negative for congestion, ear discharge and nosebleeds.        Throat pain  Eyes:  Negative for blurred vision.  Respiratory:  Negative for cough, hemoptysis, sputum production, shortness of breath and wheezing.   Cardiovascular:  Negative for chest pain, palpitations,  orthopnea and claudication.  Gastrointestinal:  Negative for abdominal pain, blood in stool, constipation, diarrhea, heartburn, melena, nausea and vomiting.  Genitourinary:  Negative for dysuria, flank pain, frequency, hematuria and urgency.  Musculoskeletal:  Negative for back pain, joint pain and myalgias.  Skin:  Negative for rash.  Neurological:  Negative for dizziness, tingling, focal weakness, seizures, weakness and headaches.  Endo/Heme/Allergies:  Does not bruise/bleed easily.  Psychiatric/Behavioral:  Negative for depression and suicidal ideas. The patient does not have insomnia.       Allergies  Allergen Reactions   Mirtazapine Rash   Other Itching and Rash    ETCO2 tubing. Medtronic Microstream advance     Past Medical History:  Diagnosis Date   Anxiety    Arthritis    Carpal tunnel syndrome    Depression    Diabetes (HCC)    Hyperlipidemia    Hypertension    Loss of teeth due to extraction    Parkinson's disease [facility])      Past Surgical History:  Procedure Laterality Date   CARPAL TUNNEL RELEASE Right 11/28/2020   Procedure: CARPAL TUNNEL RELEASE ENDOSCOPIC;  Surgeon: Christena Flake, MD;  Location: [facility] ORS;  Service: Orthopedics;  Laterality: Right;   CARPAL TUNNEL RELEASE Left 01/22/2021   Procedure: CARPAL TUNNEL RELEASE ENDOSCOPIC;  Surgeon: Christena Flake, MD;  Location: [facility] ORS;  Service: Orthopedics;  Laterality: Left;   IR IMAGING GUIDED PORT INSERTION  06/30/2023   KNEE ARTHROSCOPY Right     Social History   Socioeconomic History   Marital  status: Single    Spouse name: [name]   Number of children: Not on file   Years of education: Not on file   Highest education level: Not on file  Occupational History   Not on file  Tobacco Use   Smoking status: Never   Smokeless tobacco: Never  Vaping Use   Vaping status: Never Used  Substance and Sexual Activity   Alcohol use: Not Currently   Drug use: Never   Sexual activity: Not Currently    Birth  control/protection: None  Other Topics Concern   Not on file  Social History Narrative   Lives with friend   Social Drivers of Health   Financial Resource Strain: Not on file  Food Insecurity: No Food Insecurity (06/22/2023)   Hunger Vital Sign    Worried About Running Out of Food in the Last Year: Never true    Ran Out of Food in the Last Year: Never true  Transportation Needs: No Transportation Needs (06/22/2023)   PRAPARE - Administrator, Civil Service (Medical): No    Lack of Transportation (Non-Medical): No  Physical Activity: Not on file  Stress: Not on file  Social Connections: Not on file  Intimate Partner Violence: Not At Risk (06/22/2023)   Humiliation, Afraid, Rape, and Kick questionnaire    Fear of Current or Ex-Partner: No    Emotionally Abused: No    Physically Abused: No    Sexually Abused: No    Family History  Problem Relation Age of Onset   Diabetes Mother    Hypertension Mother    CVA Father    Heart attack Father    Depression Sister    Depression Brother      Current Outpatient Medications:    buPROPion (WELLBUTRIN XL) 150 MG 24 hr tablet, Take 1 tablet (150 mg total) by mouth daily. Total of 450 mg daily. Take along with 300 mg tab, Disp: 30 tablet, Rfl: 5   buPROPion (WELLBUTRIN XL) 300 MG 24 hr tablet, Take 1 tablet (300 mg total) by mouth daily. Total of 450 mg daily. Take along with 150 mg tab, Disp: 30 tablet, Rfl: 5   busPIRone (BUSPAR) 7.5 MG tablet, Take 1 tablet (7.5 mg total) by mouth 2 (two) times daily., Disp: 60 tablet, Rfl: 3   carbidopa-levodopa (SINEMET IR) 25-100 MG tablet, Take 1 tablet by mouth 5 (five) times daily. 3 in morning 2 in evening, Disp: , Rfl:    clonazePAM (KLONOPIN) 1 MG tablet, May take 1 tablet (1 mg total) by mouth 2 (two) times daily as needed for anxiety. May also take 0.5-1 tablets (0.5-1 mg total) at bedtime as needed for anxiety., Disp: 90 tablet, Rfl: 1   dexamethasone (DECADRON) 4 MG tablet, Take 2  tablets (8 mg) by mouth daily x 3 days starting the day after cisplatin chemotherapy. Take with food., Disp: 30 tablet, Rfl: 1   fentaNYL (DURAGESIC) 25 MCG/HR, Place 1 patch onto the skin every 3 (three) days., Disp: 5 patch, Rfl: 0   lidocaine-prilocaine (EMLA) cream, Apply to affected area once, Disp: 30 g, Rfl: 3   metFORMIN (GLUCOPHAGE) 500 MG tablet, Take 500 mg by mouth daily with breakfast., Disp: , Rfl:    ondansetron (ZOFRAN) 8 MG tablet, Take 1 tablet (8 mg total) by mouth every 8 (eight) hours as needed for nausea or vomiting. Start on the third day after cisplatin., Disp: 30 tablet, Rfl: 1   Oxycodone HCl 10 MG TABS, Take 1 tablet (  10 mg total) by mouth every 4 (four) hours as needed for up to 15 days., Disp: 90 tablet, Rfl: 0   prochlorperazine (COMPAZINE) 10 MG tablet, Take 1 tablet (10 mg total) by mouth every 6 (six) hours as needed (Nausea or vomiting)., Disp: 30 tablet, Rfl: 1   rosuvastatin (CRESTOR) 20 MG tablet, Take 20 mg by mouth daily., Disp: , Rfl:    sertraline (ZOLOFT) 100 MG tablet, Take 1.5 tablets (150 mg total) by mouth at bedtime., Disp: 45 tablet, Rfl: 3   sildenafil (VIAGRA) 50 MG tablet, Take 50 mg by mouth daily as needed for erectile dysfunction., Disp: , Rfl:    tadalafil (CIALIS) 5 MG tablet, Take 5 mg by mouth daily., Disp: , Rfl:    tamsulosin (FLOMAX) 0.4 MG CAPS capsule, Take by mouth., Disp: , Rfl:    traZODone (DESYREL) 50 MG tablet, Take 0.5-2 tablets (25-100 mg total) by mouth at bedtime as needed for sleep., Disp: 60 tablet, Rfl: 1   atenolol (TENORMIN) 50 MG tablet, Take 50 mg by mouth daily. (Patient not taking: Reported on 07/12/2023), Disp: , Rfl:    ketoconazole (NIZORAL) 2 % shampoo, Apply 1 application topically every other day. (Patient not taking: Reported on 06/22/2023), Disp: , Rfl:    methylphenidate (RITALIN) 5 MG tablet, Take 1 tablet (5 mg total) by mouth daily. (Patient not taking: Reported on 06/22/2023), Disp: 30 tablet, Rfl: 0   VICTOZA  18 MG/3ML SOPN, Inject 1.2 mg into the skin daily. (Patient not taking: Reported on 06/22/2023), Disp: , Rfl:  No current facility-administered medications for this visit.  Facility-Administered Medications Ordered in Other Visits:    0.9 %  sodium chloride infusion, , Intravenous, Continuous, Creig Hines, MD, Last Rate: 10 mL/hr at 07/12/23 0948, New Bag at 07/12/23 0948  Physical exam:  Vitals:   07/12/23 0854  BP: 136/81  Pulse: (!) 104  Resp: 19  Temp: (!) 96.5 F (35.8 C)  SpO2: 95%  Weight: 170 lb 12.8 oz (77.5 kg)   Physical Exam Neck:     Comments: Palpable cervical adenopathy Cardiovascular:     Rate and Rhythm: Normal rate and regular rhythm.     Heart sounds: Normal heart sounds.  Pulmonary:     Effort: Pulmonary effort is normal.     Breath sounds: Normal breath sounds.  Skin:    General: Skin is warm and dry.  Neurological:     Mental Status: He is alert and oriented to person, place, and time.         Latest Ref Rng & Units 07/12/2023    8:35 AM  CMP  Glucose 70 - 99 mg/dL 413   BUN 8 - 23 mg/dL 15   Creatinine 2.44 - 1.24 mg/dL 0.10   Sodium 272 - 536 mmol/L 138   Potassium 3.5 - 5.1 mmol/L 4.2   Chloride 98 - 111 mmol/L 99   CO2 22 - 32 mmol/L 28   Calcium 8.9 - 10.3 mg/dL 9.2       Latest Ref Rng & Units 07/12/2023    8:35 AM  CBC  WBC 4.0 - 10.5 K/uL 6.9   Hemoglobin 13.0 - 17.0 g/dL 64.4   Hematocrit 03.4 - 52.0 % 42.4   Platelets 150 - 400 K/uL 195     No images are attached to the encounter.  NM PET Image Initial (PI) Skull Base To Thigh Result Date: 07/06/2023 CLINICAL DATA:  Initial treatment strategy for neoplasm of the oropharynx. EXAM:  NUCLEAR MEDICINE PET SKULL BASE TO THIGH TECHNIQUE: 9.7 mCi F-18 FDG was injected intravenously. Full-ring PET imaging was performed from the skull base to thigh after the radiotracer. CT data was obtained and used for attenuation correction and anatomic localization. Fasting blood glucose: 106 mg/dl  COMPARISON:  Or is FINDINGS: Mediastinal blood pool activity: SUV max 2.2 Liver activity: SUV max NA NECK: Large round mass in the posterior RIGHT oropharynx centered about the lingual and palatine tonsil measures 3.4 x 3.7 cm and has intense metabolic activity with SUV max equal 13.3. Hypermetabolic mass appears confined to the pharyngeal mucosa. Adjacent intense hypermetabolic LEFT level 2 lymph node anterior to the RIGHT sternocleidomastoid muscle measures 12 mm on image 27 with SUV max equal 9.9. Second hypermetabolic level 2 lymph node beneath the sternocleidomastoid muscle measures 12 mm on image 31/6 with SUV max equal 5.1. No hypermetabolic contralateral cervical nodes. No supraclavicular adenopathy. Incidental CT findings: None. CHEST: No hypermetabolic mediastinal or hilar nodes. No suspicious pulmonary nodules on the CT scan. Incidental CT findings: Port in the anterior chest wall with tip in distal SVC. ABDOMEN/PELVIS: No abnormal hypermetabolic activity within the liver, pancreas, adrenal glands, or spleen. No hypermetabolic lymph nodes in the abdomen or pelvis. Incidental CT findings: None. SKELETON: No focal hypermetabolic activity to suggest skeletal metastasis. Incidental CT findings: None. IMPRESSION: 1. Hypermetabolic mass in the posterior RIGHT oropharynx consistent with primary head neck carcinoma. 2. Two hypermetabolic RIGHT level 2 lymph nodes consistent with nodal metastasis. 3. No evidence distant metastatic disease. Electronically Signed   By: Genevive Bi M.D.   On: 07/06/2023 12:41   IR IMAGING GUIDED PORT INSERTION Result Date: 06/30/2023 INDICATION: 68 year old male with head and neck cancer. He presents for port catheter placement to establish durable venous access for chemotherapy. EXAM: IMPLANTED PORT A CATH PLACEMENT WITH ULTRASOUND AND FLUOROSCOPIC GUIDANCE MEDICATIONS: None. ANESTHESIA/SEDATION: Versed 2 mg IV; Fentanyl 75 mcg IV; administered by the radiology nurse.  Moderate Sedation Time:  18 minutes The patient's vital signs and level of consciousness were continuously monitored during the procedure by the interventional radiology nurse under my direct supervision. FLUOROSCOPY: Radiation exposure index: 4 mGy reference air kerma COMPLICATIONS: None immediate. PROCEDURE: The right neck and chest was prepped with chlorhexidine, and draped in the usual sterile fashion using maximum barrier technique (cap and mask, sterile gown, sterile gloves, large sterile sheet, hand hygiene and cutaneous antiseptic). Local anesthesia was attained by infiltration with 1% lidocaine with epinephrine. Ultrasound demonstrated patency of the right internal jugular vein, and this was documented with an image. Under real-time ultrasound guidance, this vein was accessed with a 21 gauge micropuncture needle and image documentation was performed. A small dermatotomy was made at the access site with an 11 scalpel. A 0.018" wire was advanced into the SVC and the access needle exchanged for a 77F micropuncture vascular sheath. The 0.018" wire was then removed and a 0.035" wire advanced into the IVC. An appropriate location for the subcutaneous reservoir was selected below the clavicle and an incision was made through the skin and underlying soft tissues. The subcutaneous tissues were then dissected using a combination of blunt and sharp surgical technique and a pocket was formed. A Bard Clear Vue single lumen power injectable portacatheter was then tunneled through the subcutaneous tissues from the pocket to the dermatotomy and the port reservoir placed within the subcutaneous pocket. The venous access site was then serially dilated and a peel away vascular sheath placed over the wire. The wire was  removed and the port catheter advanced into position under fluoroscopic guidance. The catheter tip is positioned in the superior cavoatrial junction. This was documented with a spot image. The portacatheter was  then tested and found to flush and aspirate well. The port was flushed with saline followed by 100 units/mL heparinized saline. The pocket was then closed in two layers using first subdermal inverted interrupted absorbable sutures followed by a running subcuticular suture. The epidermis was then sealed with Dermabond. The dermatotomy at the venous access site was also closed with Dermabond. IMPRESSION: Successful placement of a right IJ approach Bard Clear Vue portacatheter with ultrasound and fluoroscopic guidance. The catheter is ready for use. Electronically Signed   By: Malachy Moan M.D.   On: 06/30/2023 15:21   [place] CORE BIOPSY (LYMPH NODES) Result Date: 06/15/2023 INDICATION: RT Cervical lymph node enlargement.  RIGHT tonsillar mass EXAM: ULTRASOUND-GUIDED RIGHT CERVICAL LYMPH NODE CORE BIOPSY and ASPIRATION COMPARISON:  CT neck, 06/08/2023. MEDICATIONS: None ANESTHESIA/SEDATION: Local anesthetic was administered. COMPLICATIONS: None immediate. TECHNIQUE: Informed written consent was obtained from the patient after a discussion of the risks, benefits and alternatives to treatment. Questions regarding the procedure were encouraged and answered. Initial ultrasound scanning demonstrated enlarged RIGHT cervical lymph node. An ultrasound image was saved for documentation purposes. The procedure was planned. A timeout was performed prior to the initiation of the procedure. The operative was prepped and draped in the usual sterile fashion, and a sterile drape was applied covering the operative field. A timeout was performed prior to the initiation of the procedure. Local anesthesia was provided with 1% lidocaine with epinephrine. Under direct ultrasound guidance, an 18 gauge core needle device was utilized to obtain to obtain 3 core needle biopsies of the RIGHT cervical lymph node. The samples were placed in saline and submitted to pathology. The samples appeared relatively pauci-cellular, therefore aspiration  with the 18 gauge introducing needle was performed, yielding 3 mL of sanguinous fluid. The needle was removed and superficial hemostasis was achieved with manual compression. Post procedure scan was negative for significant hematoma. A dressing was applied. The patient tolerated the procedure well without immediate postprocedural complication. IMPRESSION: Successful ultrasound guided aspiration and core biopsy of an enlarged RIGHT cervical lymph node. Roanna Banning, MD [facility] Radiology Electronically Signed   By: Roanna Banning M.D.   On: 06/15/2023 14:50     Assessment and plan- Patient is a 68 y.o. male with history of stage I T2 N1 M0 squamous cell carcinoma of the oropharynx  I have reviewed PET CT scan images independently and discussed Findings with the patient and his wife which does not show any evidence of metastatic disease.  Primary oropharyngeal mass measures 3.4 x 3.7 cm and he has 2 other hypermetabolic level 2 cervical lymph nodes.  No hypermetabolic contralateral cervical lymph nodes.  This therefore constitute stage I T2 N1 disease.  Plan is to proceed with concurrent chemoradiation with weekly cisplatin cycle 1 today.  Labs okay to proceed with the same.  He will be seen by covering provider next week and I will see him back in 2 weeks for cycle 3.  Neoplasm related pain: Patient is somewhat slow in his thinking but is oriented to time and place and was able to tell me about his cancer and upcoming plan for treatment.  I therefore do not think that he is confused.  I am holding off on making any further changes to his pain medication regimen today as  partly could be secondary to his ongoing pain meds as well.  I expect his pain to improve once he responds to radiation.  However we could be dealing with more radiation mucositis that can lead to worsening pain in the coming weeks as well.  He will be assessed by NP Laurette Schimke in 5 days  to see if any changes to his pain medication are warranted.  Patient's wife is concerned about his ability to eat during chemoradiation and patient is therefore meeting up with surgery to discuss prophylactic PEG tube.  I did discuss with the patient that he can meet with the surgeon and decide if he wants to proceed with a prophylactic PEG tube at this time versus wait and watch and see how his nutrition is in the upcoming weeks and decide about that down the line.   Visit Diagnosis 1. Squamous cell carcinoma of oropharynx (HCC)   2. Encounter for antineoplastic chemotherapy      Dr. Owens Shark, MD, MPH [facility] at [facility] [phone] 07/12/2023 10:58 AM   Addendum: There was a concern that patient had not voided since 7:30 AM.  Despite giving pre and post cisplatin fluids and additional 500 mL of bolus the patient did not void for a while but did void sometime around 4 PM however the quantity could not be measured.  We have given instructions to patient significant other to continue monitor his urine output at home.  If there is a concern that he is not voiding sufficiently she may have to bring him to the ER to get a bladder scan.  Patient is on narcotics which can lead to urinary retention but on exam patient did not seem to have a distended bladder  Dr. Owens Shark, MD, MPH [facility] at [facility] [phone] 07/12/2023 4:00 PM

## 2023-07-12 NOTE — Telephone Encounter (Signed)
 Wife notified Ricky [name] that pt Is unable to get script. I called patient's pharmacy to inquire. Pt has a quantity limit of 6 tabs per day.  Attempted pa to accomplish the approval but unable to run it through cover mymed. Insurance requires a phone call -cALL [phone] to discuss

## 2023-07-12 NOTE — Progress Notes (Signed)
 Patient here for first infusion of cisplatin. Pt presenting with disorientation that per wife is different from baseline. Pt disoriented at times to the year, the date, and repeatedly stating he is not sure what is going on. [name], MD notified that pt is very disoriented at times. Consent for treatment obtained by patient and an additional one was obtained from wife.Pt also unable to void prior to cisplatin. Pt attempted to void 3 times with nurse assisting and was unsuccessful. Per [name], MD to give 500 ml bolus prior to starting cisplatin. Bolus started at 1322 and ended at 1407. Pt attempted to void post bolus and was unsuccessful. [name], MD notified and verified ok to proceed with cisplatin despite inability to void. [name], MD came to assess patient and palpate bladder. No abdominal distention or tenderness. Pt and wife were educated to seek emergency care if unable to void and that inability to urinate can be a potential side effect of pain medication. Pt was started on fentanyl patches yesterday per his wife. Pt was taken to the bathroom at 1550 and was finally able to void although the amount was unable to be measured. [name], MD was notified. Pt was stable at discharge.

## 2023-07-12 NOTE — Patient Instructions (Signed)
 [facility] - A DEPT OF [facility]  Discharge Instructions: Thank you for choosing [facility] to provide your oncology and hematology care.  If you have a lab appointment with the Cancer Center, please go directly to the Cancer Center and check in at the registration area.  Wear comfortable clothing and clothing appropriate for easy access to any Portacath or PICC line.   We strive to give you quality time with your provider. You may need to reschedule your appointment if you arrive late (15 or more minutes).  Arriving late affects you and other patients whose appointments are after yours.  Also, if you miss three or more appointments without notifying the office, you may be dismissed from the clinic at the provider's discretion.      For prescription refill requests, have your pharmacy contact our office and allow 72 hours for refills to be completed.    Today you received the following chemotherapy and/or immunotherapy agents- cisplatin      To help prevent nausea and vomiting after your treatment, we encourage you to take your nausea medication as directed.  BELOW ARE SYMPTOMS THAT SHOULD BE REPORTED IMMEDIATELY: *FEVER GREATER THAN 100.4 F (38 C) OR HIGHER *CHILLS OR SWEATING *NAUSEA AND VOMITING THAT IS NOT CONTROLLED WITH YOUR NAUSEA MEDICATION *UNUSUAL SHORTNESS OF BREATH *UNUSUAL BRUISING OR BLEEDING *URINARY PROBLEMS (pain or burning when urinating, or frequent urination) *BOWEL PROBLEMS (unusual diarrhea, constipation, pain near the anus) TENDERNESS IN MOUTH AND THROAT WITH OR WITHOUT PRESENCE OF ULCERS (sore throat, sores in mouth, or a toothache) UNUSUAL RASH, SWELLING OR PAIN  UNUSUAL VAGINAL DISCHARGE OR ITCHING   Items with * indicate a potential emergency and should be followed up as soon as possible or go to the Emergency Department if any problems should occur.  Please show the CHEMOTHERAPY ALERT CARD or IMMUNOTHERAPY  ALERT CARD at check-in to the Emergency Department and triage nurse.  Should you have questions after your visit or need to cancel or reschedule your appointment, please contact [facility] - A DEPT OF [facility]  [phone] and follow the prompts.  Office hours are 8:00 a.m. to 4:30 p.m. Monday - Friday. Please note that voicemails left after 4:00 p.m. may not be returned until the following business day.  We are closed weekends and major holidays. You have access to a nurse at all times for urgent questions. Please call the main number to the clinic [phone] and follow the prompts.  For any non-urgent questions, you may also contact your provider using MyChart. We now offer e-Visits for anyone 9 and older to request care online for non-urgent symptoms. For details visit [email or web address].   Also download the MyChart app! Go to the app store, search "MyChart", open the app, select , and log in with your MyChart username and password.

## 2023-07-13 ENCOUNTER — Telehealth: Payer: Self-pay

## 2023-07-13 ENCOUNTER — Encounter: Payer: Self-pay | Admitting: *Deleted

## 2023-07-13 ENCOUNTER — Other Ambulatory Visit: Payer: Self-pay | Admitting: Hospice and Palliative Medicine

## 2023-07-13 ENCOUNTER — Other Ambulatory Visit: Payer: Self-pay

## 2023-07-13 ENCOUNTER — Encounter: Payer: Self-pay | Admitting: Oncology

## 2023-07-13 ENCOUNTER — Ambulatory Visit
Admission: RE | Admit: 2023-07-13 | Discharge: 2023-07-13 | Disposition: A | Discharge status: Home / Self Care | Payer: Medicare Other | Source: Ambulatory Visit | Attending: Radiation Oncology | Admitting: Radiation Oncology

## 2023-07-13 DIAGNOSIS — Z510 Encounter for antineoplastic radiation therapy: Secondary | ICD-10-CM | POA: Diagnosis not present

## 2023-07-13 LAB — RAD ONC ARIA SESSION SUMMARY
Course Elapsed Days: 1
Plan Fractions Treated to Date: 2
Plan Prescribed Dose Per Fraction: 2 Gy
Plan Total Fractions Prescribed: 35
Plan Total Prescribed Dose: 70 Gy
Reference Point Dosage Given to Date: 4 Gy
Reference Point Session Dosage Given: 2 Gy
Session Number: 2

## 2023-07-13 MED ORDER — OXYCODONE HCL 10 MG PO TABS: 10.0000 mg | ORAL_TABLET | ORAL | 0 refills | Status: DC | PRN

## 2023-07-13 NOTE — Telephone Encounter (Signed)
 Phone call made and I spoke with Raynelle Fanning at [facility]. Quantity exception submitted. Insurance will only cover 180 tabs per month of the oxycodone 10 mg. Josh stated that in the meantime, he will send a new script of he 5 mg dosing so patient can have med on hand.

## 2023-07-13 NOTE — Telephone Encounter (Signed)
 Telephone call to patient for follow up after receiving first infusion.   Patient wife states infusion went great.  States still not eating good but drinking plenty of fluids.   Denies any nausea or vomiting.  Encouraged patient wife to call for any concerns or questions.

## 2023-07-14 ENCOUNTER — Telehealth: Payer: Self-pay

## 2023-07-14 ENCOUNTER — Ambulatory Visit

## 2023-07-14 ENCOUNTER — Other Ambulatory Visit: Payer: Self-pay | Admitting: Hospice and Palliative Medicine

## 2023-07-14 ENCOUNTER — Inpatient Hospital Stay: Admitting: Licensed Clinical Social Worker

## 2023-07-14 ENCOUNTER — Inpatient Hospital Stay (HOSPITAL_BASED_OUTPATIENT_CLINIC_OR_DEPARTMENT_OTHER): Payer: Medicare Other | Admitting: Hospice and Palliative Medicine

## 2023-07-14 ENCOUNTER — Ambulatory Visit: Payer: Medicare Other

## 2023-07-14 DIAGNOSIS — C109 Malignant neoplasm of oropharynx, unspecified: Secondary | ICD-10-CM

## 2023-07-14 MED ORDER — DEXAMETHASONE 2 MG PO TABS: ORAL_TABLET | ORAL | 1 refills | Status: DC

## 2023-07-14 NOTE — Telephone Encounter (Signed)
 Patient's spouse was notified that Sharia Reeve spoke to Dr. Homero Hyson Robert and he has sent in a prescription for dexamethasone to see if this helps his fatigue, appetite and other symptoms.

## 2023-07-14 NOTE — Telephone Encounter (Signed)
 Ricky [name] from the cancer center [phone] called states that pt wife called states that he is withdrawn and like he given up. states that he doesn't want to go to chemo anymore. Ricky [name] from cancer center thinks that Dr. Vanetta Shawl needs to know.   I have asked the front desk to see if they can move his appt up.- pending

## 2023-07-14 NOTE — Progress Notes (Signed)
 Discussed with Dr. [name].  Patient is fatigued with minimal oral intake.  Will start low-dose dexamethasone to see if this improves his appetite and fatigue.  Patient also likely with ongoing psychiatric issues in light of his depression and anxiety.  Message sent to his psychiatrist for follow-up.  Patient has also been referred to [facility].

## 2023-07-14 NOTE — Telephone Encounter (Signed)
 Spoke with nurse at [facility] to inform her of some concerns with patient.  Dr. Vanetta Shawl is out of town but she will forward the message to Dr. Elna Breslow.  I also informed [name] that I spoke with Dr. Bing Matter office and it would be a good idea if she gave them a call to voice her concerns as well.

## 2023-07-14 NOTE — Telephone Encounter (Signed)
 Message left on nurse line at Dr. Bing Matter office [phone]) to return my call to provide them an update on patient status.

## 2023-07-14 NOTE — Progress Notes (Signed)
 [facility] Clinical Social Work  Initial Assessment   Ricky [name] is a 68 y.o. year old male contacted caregiver by phone. Clinical Social Work was referred by medical provider, Ricky Munroe, NP, for assessment of psychosocial needs.   SDOH (Social Determinants of Health) assessments performed: Yes SDOH Interventions    Flowsheet Row Office Visit from 02/25/2023 in confidential department Office Visit from 01/07/2023 in confidential department Office Visit from 10/12/2022 in confidential department Office Visit from 06/16/2022 in confidential department Office Visit from 03/31/2022 in confidential department Office Visit from 02/09/2022 in confidential department  SDOH Interventions        Depression Interventions/Treatment  Medication Medication Medication Medication Medication Medication       SDOH Screenings   Food Insecurity: No Food Insecurity (06/22/2023)  Housing: Unknown (06/22/2023)  Transportation Needs: No Transportation Needs (06/22/2023)  Utilities: Not At Risk (06/22/2023)  Depression (PHQ2-9): Medium Risk (06/22/2023)  Tobacco Use: Low Risk  (07/12/2023)     Distress Screen completed: No    06/22/2023    2:33 PM  ONCBCN DISTRESS SCREENING  Screening Type Initial Screening  Distress experienced in past week (1-10) 0      Family/Social Information:  Housing Arrangement: patient lives with his wife, Ricky [name]. Family members/support persons in your life? Family.  Patient's mother and son are supportive. 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 Advanced directives: Yes-per wife, she is HCPOA. Services Currently in place:  BCBS  Coping/ Adjustment to diagnosis: Patient understands treatment plan and what happens next? yes Concerns about diagnosis and/or treatment: Feelings of anger or  sadness Patient reported stressors: Depression and Anxiety/ nervousness per patient's wife. Hopes and/or priorities: stabilizing mental health. Patient enjoys  being alone at the moment. 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  Contacted Ricky [name] regarding wife's concerns.  Wife stated patient is remaining in bed and not wanting to speak with family members.  He is a patient of Dr. Vanetta [name].   Referral made to [facility].  CSW also provided supportive counseling to wife.   Follow Up Plan: CSW will follow-up with patient by phone  Patient verbalizes understanding of plan: Yes    Ricky Baseman, LCSW Clinical Social Worker [facility]

## 2023-07-14 NOTE — Telephone Encounter (Signed)
 Yes , please set this patient up for a sooner appointment with Dr. Vanetta Shawl. Please try to schedule this patient for psychotherapy if not already in therapy. Please let me know if any further assistance needed.

## 2023-07-14 NOTE — Progress Notes (Signed)
 Multidisciplinary Oncology Council Documentation  Ricky [name] was presented by our [facility] on 07/14/2023, which included representatives from:  Palliative Care Dietitian  Physical/Occupational Therapist Nurse Navigator Genetics Social work Survivorship RN Financial Navigator Research RN   Ricky [name] currently presents with history of H&N cancer  We reviewed previous medical and familial history, history of present illness, and recent lab results along with all available histopathologic and imaging studies. The MOC considered available treatment options and made the following recommendations/referrals:  SW, nutrition, SLP  The MOC is a meeting of clinicians from various specialty areas who evaluate and discuss patients for whom a multidisciplinary approach is being considered. Final determinations in the plan of care are those of the provider(s).   Today's extended care, comprehensive team conference, Ricky [name] was not present for the discussion and was not examined.

## 2023-07-14 NOTE — Telephone Encounter (Signed)
 Josh requested I call Patient's spouse for update.  Patient refused to come to XRT appt today and became very emotional.  [name] is concerned with patient mental stability as he has become very withdrawn, refusing to speak with family members, as well as refusing medical appts.    [name] is wondering if the pain medications may be interfering with his psychiatric meds.  Does take Oxycodone as prescribed and reports doing Fentanyl patch 1 every 3 days with placement of additional patch if needed to control pain as advised.  Did apply 2 Fentanyl patches yesterday.

## 2023-07-15 ENCOUNTER — Other Ambulatory Visit: Payer: Self-pay

## 2023-07-15 ENCOUNTER — Ambulatory Visit
Admission: RE | Admit: 2023-07-15 | Discharge: 2023-07-15 | Disposition: A | Discharge status: Home / Self Care | Payer: Medicare Other | Source: Ambulatory Visit | Attending: Radiation Oncology | Admitting: Radiation Oncology

## 2023-07-15 DIAGNOSIS — Z510 Encounter for antineoplastic radiation therapy: Secondary | ICD-10-CM | POA: Diagnosis not present

## 2023-07-15 LAB — RAD ONC ARIA SESSION SUMMARY
Course Elapsed Days: 3
Plan Fractions Treated to Date: 3
Plan Prescribed Dose Per Fraction: 2 Gy
Plan Total Fractions Prescribed: 35
Plan Total Prescribed Dose: 70 Gy
Reference Point Dosage Given to Date: 6 Gy
Reference Point Session Dosage Given: 2 Gy
Session Number: 3

## 2023-07-15 NOTE — Telephone Encounter (Signed)
 Can he be placed on therapist priority waitlist if anything opens up sooner so we can get him in earlier. The other option is contacting '[facility] ' its an online platform for therapy and they work with patients who has cancer. Hope this helps.

## 2023-07-15 NOTE — Telephone Encounter (Signed)
 Noted.

## 2023-07-15 NOTE — Telephone Encounter (Signed)
 Noted , Please  let him see Dr.Hisada sooner or take to [facility] . I do not believe I have any openings at this time .

## 2023-07-16 ENCOUNTER — Encounter: Payer: Self-pay | Admitting: Hospice and Palliative Medicine

## 2023-07-16 ENCOUNTER — Inpatient Hospital Stay (HOSPITAL_BASED_OUTPATIENT_CLINIC_OR_DEPARTMENT_OTHER): Admitting: Hospice and Palliative Medicine

## 2023-07-16 ENCOUNTER — Ambulatory Visit
Admission: RE | Admit: 2023-07-16 | Discharge: 2023-07-16 | Disposition: A | Discharge status: Home / Self Care | Payer: Medicare Other | Source: Ambulatory Visit | Attending: Radiation Oncology | Admitting: Radiation Oncology

## 2023-07-16 ENCOUNTER — Other Ambulatory Visit: Payer: Self-pay

## 2023-07-16 VITALS — BP 127/77 | HR 86 | Temp 98.5°F | Resp 18 | Wt 169.7 lb

## 2023-07-16 DIAGNOSIS — C109 Malignant neoplasm of oropharynx, unspecified: Secondary | ICD-10-CM | POA: Diagnosis not present

## 2023-07-16 DIAGNOSIS — Z510 Encounter for antineoplastic radiation therapy: Secondary | ICD-10-CM | POA: Diagnosis not present

## 2023-07-16 DIAGNOSIS — Z5111 Encounter for antineoplastic chemotherapy: Secondary | ICD-10-CM | POA: Diagnosis not present

## 2023-07-16 LAB — RAD ONC ARIA SESSION SUMMARY
Course Elapsed Days: 4
Plan Fractions Treated to Date: 4
Plan Prescribed Dose Per Fraction: 2 Gy
Plan Total Fractions Prescribed: 35
Plan Total Prescribed Dose: 70 Gy
Reference Point Dosage Given to Date: 8 Gy
Reference Point Session Dosage Given: 2 Gy
Session Number: 4

## 2023-07-16 MED FILL — Fosaprepitant Dimeglumine For IV Infusion 150 MG (Base Eq): INTRAVENOUS | Qty: 5 | Status: AC

## 2023-07-16 NOTE — Progress Notes (Signed)
 Symptom Management Clinic [facility] at [facility] [phone] [phone]  Patient Care Team: [name], MD as PCP - General (Internal Medicine)   NAME OF PATIENT: Ricky [name]  [id number]  January 10, 1956   DATE OF VISIT: 07/16/23  REASON FOR CONSULT: Ricky [name] is a 68 y.o. 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   INTERVAL HISTORY: Patient with recent diagnosis of squamous cell carcinoma of the glossotonsillar sulcus.    Patient received cycle 1 cisplatin on 07/12/2023.  Has also been receiving concurrent radiation.  Patient saw Dr. [name] on 07/12/2023 at which time patient was somewhat sluggish but oriented.  Pain was reportedly stable.  Patient was not eating or drinking much so he was referred to surgery for prophylactic PEG tube.  However, patient subsequently no-show to surgical appointment.  I was asked to follow-up with patient today regarding symptom management.  Today, patient states that he feels about the same.  Denies significant changes or concerns.  Continues to feel depressed and anxious, slightly worse since his cancer diagnosis.  He states that some days he just does not feel like getting out of bed.  Denies significant weakness but does endorse fatigue.  Also continues to have poor oral intake.  Pain is reportedly stable as long as he takes oxycodone at regular interval dosing.  Also continues on transdermal fentanyl.  Denies any neurologic complaints. Denies recent fevers or illnesses. Denies any easy bleeding or bruising. Denies chest pain. Denies any nausea, vomiting, constipation, or diarrhea. Denies urinary complaints. Patient offers no further specific complaints today.  PAST MEDICAL HISTORY: Past Medical History:  Diagnosis Date   Anxiety    Arthritis    Carpal tunnel syndrome    Depression    Diabetes (HCC)    Hyperlipidemia     Hypertension    Loss of teeth due to extraction    Neoplasm related pain    Parkinson's disease (HCC)     PAST SURGICAL HISTORY:  Past Surgical History:  Procedure Laterality Date   CARPAL TUNNEL RELEASE Right 11/28/2020   Procedure: CARPAL TUNNEL RELEASE ENDOSCOPIC;  Surgeon: Christena Flake, MD;  Location: [facility] ORS;  Service: Orthopedics;  Laterality: Right;   CARPAL TUNNEL RELEASE Left 01/22/2021   Procedure: CARPAL TUNNEL RELEASE ENDOSCOPIC;  Surgeon: Christena Flake, MD;  Location: [facility] ORS;  Service: Orthopedics;  Laterality: Left;   IR IMAGING GUIDED PORT INSERTION  06/30/2023   KNEE ARTHROSCOPY Right     HEMATOLOGY/ONCOLOGY HISTORY:  Oncology History  Squamous cell carcinoma of oropharynx (HCC)  06/22/2023 Initial Diagnosis   Squamous cell carcinoma of oropharynx (HCC)   06/22/2023 Cancer Staging   Staging form: Pharynx - HPV-Mediated Oropharynx, AJCC 8th Edition - Clinical stage from 06/22/2023: Stage I (cT2, cN1, cM0, p16+) - Signed by Creig Hines, MD on 07/12/2023 Stage prefix: Initial diagnosis   07/12/2023 -  Chemotherapy   Patient is on Treatment Plan : HEAD/NECK Cisplatin (40) q7d       ALLERGIES:  is allergic to mirtazapine and other.  MEDICATIONS:  Current Outpatient Medications  Medication Sig Dispense Refill   atenolol (TENORMIN) 50 MG tablet Take 50 mg by mouth daily. (Patient not taking: Reported on 07/12/2023)     buPROPion (WELLBUTRIN XL) 150 MG 24 hr tablet Take 1 tablet (150 mg total) by mouth daily. Total of 450 mg daily. Take along with 300 mg tab 30 tablet 5  buPROPion (WELLBUTRIN XL) 300 MG 24 hr tablet Take 1 tablet (300 mg total) by mouth daily. Total of 450 mg daily. Take along with 150 mg tab 30 tablet 5   busPIRone (BUSPAR) 7.5 MG tablet Take 1 tablet (7.5 mg total) by mouth 2 (two) times daily. 60 tablet 3   carbidopa-levodopa (SINEMET IR) 25-100 MG tablet Take 1 tablet by mouth 5 (five) times daily. 3 in morning 2 in evening     clonazePAM  (KLONOPIN) 1 MG tablet May take 1 tablet (1 mg total) by mouth 2 (two) times daily as needed for anxiety. May also take 0.5-1 tablets (0.5-1 mg total) at bedtime as needed for anxiety. 90 tablet 1   dexamethasone (DECADRON) 2 MG tablet Take 1 tablet by mouth daily x 1 week, then take 1/2 tablet daily x 1 week. Do not abruptly stop. 14 tablet 1   dexamethasone (DECADRON) 4 MG tablet Take 2 tablets (8 mg) by mouth daily x 3 days starting the day after cisplatin chemotherapy. Take with food. 30 tablet 1   fentaNYL (DURAGESIC) 25 MCG/HR Place 1 patch onto the skin every 3 (three) days. 5 patch 0   ketoconazole (NIZORAL) 2 % shampoo Apply 1 application topically every other day. (Patient not taking: Reported on 06/22/2023)     lidocaine-prilocaine (EMLA) cream Apply to affected area once 30 g 3   metFORMIN (GLUCOPHAGE) 500 MG tablet Take 500 mg by mouth daily with breakfast.     methylphenidate (RITALIN) 5 MG tablet Take 1 tablet (5 mg total) by mouth daily. (Patient not taking: Reported on 06/22/2023) 30 tablet 0   ondansetron (ZOFRAN) 8 MG tablet Take 1 tablet (8 mg total) by mouth every 8 (eight) hours as needed for nausea or vomiting. Start on the third day after cisplatin. 30 tablet 1   Oxycodone HCl 10 MG TABS Take 1 tablet (10 mg total) by mouth every 4 (four) hours as needed. 90 tablet 0   prochlorperazine (COMPAZINE) 10 MG tablet Take 1 tablet (10 mg total) by mouth every 6 (six) hours as needed (Nausea or vomiting). 30 tablet 1   rosuvastatin (CRESTOR) 20 MG tablet Take 20 mg by mouth daily.     sertraline (ZOLOFT) 100 MG tablet Take 1.5 tablets (150 mg total) by mouth at bedtime. 45 tablet 3   sildenafil (VIAGRA) 50 MG tablet Take 50 mg by mouth daily as needed for erectile dysfunction.     tadalafil (CIALIS) 5 MG tablet Take 5 mg by mouth daily.     tamsulosin (FLOMAX) 0.4 MG CAPS capsule Take by mouth.     traZODone (DESYREL) 50 MG tablet Take 0.5-2 tablets (25-100 mg total) by mouth at bedtime  as needed for sleep. 60 tablet 1   VICTOZA 18 MG/3ML SOPN Inject 1.2 mg into the skin daily. (Patient not taking: Reported on 06/22/2023)     No current facility-administered medications for this visit.    VITAL SIGNS: There were no vitals taken for this visit. There were no vitals filed for this visit.   Estimated body mass index is 25.22 kg/m as calculated from the following:   Height as of 06/30/23: 5\' 9"  (1.753 m).   Weight as of 07/12/23: 170 lb 12.8 oz (77.5 kg).  LABS: CBC:    Component Value Date/Time   WBC 6.9 07/12/2023 0835   WBC 6.1 08/13/2022 0923   HGB 14.3 07/12/2023 0835   HCT 42.4 07/12/2023 0835   PLT 195 07/12/2023 0835   MCV  88.9 07/12/2023 0835   NEUTROABS 5.0 07/12/2023 0835   LYMPHSABS 1.2 07/12/2023 0835   MONOABS 0.6 07/12/2023 0835   EOSABS 0.1 07/12/2023 0835   BASOSABS 0.1 07/12/2023 0835   Comprehensive Metabolic Panel:    Component Value Date/Time   NA 138 07/12/2023 0835   K 4.2 07/12/2023 0835   CL 99 07/12/2023 0835   CO2 28 07/12/2023 0835   BUN 15 07/12/2023 0835   CREATININE 1.00 07/12/2023 0835   GLUCOSE 163 (H) 07/12/2023 0835   CALCIUM 9.2 07/12/2023 0835   AST 14 (L) 06/24/2023 1418   ALT 7 06/24/2023 1418   ALKPHOS 32 (L) 06/24/2023 1418   BILITOT 0.7 06/24/2023 1418   PROT 6.6 06/24/2023 1418   ALBUMIN 4.1 06/24/2023 1418    RADIOGRAPHIC STUDIES: NM PET Image Initial (PI) Skull Base To Thigh Result Date: 07/06/2023 CLINICAL DATA:  Initial treatment strategy for neoplasm of the oropharynx. EXAM: NUCLEAR MEDICINE PET SKULL BASE TO THIGH TECHNIQUE: 9.7 mCi F-18 FDG was injected intravenously. Full-ring PET imaging was performed from the skull base to thigh after the radiotracer. CT data was obtained and used for attenuation correction and anatomic localization. Fasting blood glucose: 106 mg/dl COMPARISON:  Or is FINDINGS: Mediastinal blood pool activity: SUV max 2.2 Liver activity: SUV max NA NECK: Large round mass in the  posterior RIGHT oropharynx centered about the lingual and palatine tonsil measures 3.4 x 3.7 cm and has intense metabolic activity with SUV max equal 13.3. Hypermetabolic mass appears confined to the pharyngeal mucosa. Adjacent intense hypermetabolic LEFT level 2 lymph node anterior to the RIGHT sternocleidomastoid muscle measures 12 mm on image 27 with SUV max equal 9.9. Second hypermetabolic level 2 lymph node beneath the sternocleidomastoid muscle measures 12 mm on image 31/6 with SUV max equal 5.1. No hypermetabolic contralateral cervical nodes. No supraclavicular adenopathy. Incidental CT findings: None. CHEST: No hypermetabolic mediastinal or hilar nodes. No suspicious pulmonary nodules on the CT scan. Incidental CT findings: Port in the anterior chest wall with tip in distal SVC. ABDOMEN/PELVIS: No abnormal hypermetabolic activity within the liver, pancreas, adrenal glands, or spleen. No hypermetabolic lymph nodes in the abdomen or pelvis. Incidental CT findings: None. SKELETON: No focal hypermetabolic activity to suggest skeletal metastasis. Incidental CT findings: None. IMPRESSION: 1. Hypermetabolic mass in the posterior RIGHT oropharynx consistent with primary head neck carcinoma. 2. Two hypermetabolic RIGHT level 2 lymph nodes consistent with nodal metastasis. 3. No evidence distant metastatic disease. Electronically Signed   By: Genevive Bi M.D.   On: 07/06/2023 12:41   IR IMAGING GUIDED PORT INSERTION Result Date: 06/30/2023 INDICATION: 68 year old male with head and neck cancer. He presents for port catheter placement to establish durable venous access for chemotherapy. EXAM: IMPLANTED PORT A CATH PLACEMENT WITH ULTRASOUND AND FLUOROSCOPIC GUIDANCE MEDICATIONS: None. ANESTHESIA/SEDATION: Versed 2 mg IV; Fentanyl 75 mcg IV; administered by the radiology nurse. Moderate Sedation Time:  18 minutes The patient's vital signs and level of consciousness were continuously monitored during the procedure  by the interventional radiology nurse under my direct supervision. FLUOROSCOPY: Radiation exposure index: 4 mGy reference air kerma COMPLICATIONS: None immediate. PROCEDURE: The right neck and chest was prepped with chlorhexidine, and draped in the usual sterile fashion using maximum barrier technique (cap and mask, sterile gown, sterile gloves, large sterile sheet, hand hygiene and cutaneous antiseptic). Local anesthesia was attained by infiltration with 1% lidocaine with epinephrine. Ultrasound demonstrated patency of the right internal jugular vein, and this was documented with an image. Under  real-time ultrasound guidance, this vein was accessed with a 21 gauge micropuncture needle and image documentation was performed. A small dermatotomy was made at the access site with an 11 scalpel. A 0.018" wire was advanced into the SVC and the access needle exchanged for a 75F micropuncture vascular sheath. The 0.018" wire was then removed and a 0.035" wire advanced into the IVC. An appropriate location for the subcutaneous reservoir was selected below the clavicle and an incision was made through the skin and underlying soft tissues. The subcutaneous tissues were then dissected using a combination of blunt and sharp surgical technique and a pocket was formed. A Bard Clear Vue single lumen power injectable portacatheter was then tunneled through the subcutaneous tissues from the pocket to the dermatotomy and the port reservoir placed within the subcutaneous pocket. The venous access site was then serially dilated and a peel away vascular sheath placed over the wire. The wire was removed and the port catheter advanced into position under fluoroscopic guidance. The catheter tip is positioned in the superior cavoatrial junction. This was documented with a spot image. The portacatheter was then tested and found to flush and aspirate well. The port was flushed with saline followed by 100 units/mL heparinized saline. The pocket  was then closed in two layers using first subdermal inverted interrupted absorbable sutures followed by a running subcuticular suture. The epidermis was then sealed with Dermabond. The dermatotomy at the venous access site was also closed with Dermabond. IMPRESSION: Successful placement of a right IJ approach Bard Clear Vue portacatheter with ultrasound and fluoroscopic guidance. The catheter is ready for use. Electronically Signed   By: Malachy Moan M.D.   On: 06/30/2023 15:21    PERFORMANCE STATUS (ECOG) : 1 - Symptomatic but completely ambulatory  Review of Systems Unless otherwise noted, a complete review of systems is negative.  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  His significant other feels like this has helped slightly.  Although some of these symptoms could be exacerbated by chemotherapy/radiation, more likely, symptoms are secondary to his longstanding anxiety and depression.  His psychiatrist, Dr. Vanetta Shawl, is out of office until 3/17.  However, covering psychiatrist is aware.  Patient denies any suicidal ideation.  He has been referred to [facility] and is followed by social work for counseling support.  Patient is also followed by nutrition.  He is not interested in rescheduling with surgeon at this time for consideration of PEG.  He says he would like to leave that as a "last option."  Neoplasm related pain -stable on current regimen of oxycodone/fentanyl.  Anxiety/depression-patient has chronic anxiety.  Followed by  psychiatry with recent dose increase of his clonazepam.  Also on Wellbutrin XL and BuSpar  Case and plan discussed with Dr. [name]  Patient expressed understanding and was in agreement with this plan. He also understands that He can call clinic at any time with any questions, concerns, or complaints.   Thank you for allowing me to participate in the care of this very pleasant patient.   Time Total: 20 minutes  Visit consisted of counseling and education dealing with the complex and emotionally intense  issues of symptom management in the setting of serious illness.Greater than 50%  of this time was spent counseling and coordinating care related to the above assessment and plan.  Signed by: Laurette Schimke, PhD, NP-C

## 2023-07-19 ENCOUNTER — Inpatient Hospital Stay

## 2023-07-19 ENCOUNTER — Other Ambulatory Visit: Payer: Self-pay | Admitting: Nurse Practitioner

## 2023-07-19 ENCOUNTER — Ambulatory Visit

## 2023-07-19 ENCOUNTER — Ambulatory Visit: Payer: Medicare Other

## 2023-07-19 ENCOUNTER — Inpatient Hospital Stay: Admitting: Nurse Practitioner

## 2023-07-19 DIAGNOSIS — C109 Malignant neoplasm of oropharynx, unspecified: Secondary | ICD-10-CM

## 2023-07-20 ENCOUNTER — Ambulatory Visit
Admission: RE | Admit: 2023-07-20 | Discharge: 2023-07-20 | Disposition: A | Discharge status: Home / Self Care | Payer: Medicare Other | Source: Ambulatory Visit | Attending: Radiation Oncology | Admitting: Radiation Oncology

## 2023-07-20 ENCOUNTER — Other Ambulatory Visit: Payer: Self-pay

## 2023-07-20 DIAGNOSIS — Z510 Encounter for antineoplastic radiation therapy: Secondary | ICD-10-CM | POA: Diagnosis not present

## 2023-07-20 LAB — RAD ONC ARIA SESSION SUMMARY
Course Elapsed Days: 8
Plan Fractions Treated to Date: 5
Plan Prescribed Dose Per Fraction: 2 Gy
Plan Total Fractions Prescribed: 35
Plan Total Prescribed Dose: 70 Gy
Reference Point Dosage Given to Date: 10 Gy
Reference Point Session Dosage Given: 2 Gy
Session Number: 5

## 2023-07-21 ENCOUNTER — Other Ambulatory Visit: Payer: Self-pay

## 2023-07-21 ENCOUNTER — Ambulatory Visit
Admission: RE | Admit: 2023-07-21 | Discharge: 2023-07-21 | Disposition: A | Discharge status: Home / Self Care | Payer: Medicare Other | Source: Ambulatory Visit | Attending: Radiation Oncology | Admitting: Radiation Oncology

## 2023-07-21 DIAGNOSIS — Z510 Encounter for antineoplastic radiation therapy: Secondary | ICD-10-CM | POA: Diagnosis not present

## 2023-07-21 LAB — RAD ONC ARIA SESSION SUMMARY
Course Elapsed Days: 9
Plan Fractions Treated to Date: 6
Plan Prescribed Dose Per Fraction: 2 Gy
Plan Total Fractions Prescribed: 35
Plan Total Prescribed Dose: 70 Gy
Reference Point Dosage Given to Date: 12 Gy
Reference Point Session Dosage Given: 2 Gy
Session Number: 6

## 2023-07-22 ENCOUNTER — Other Ambulatory Visit: Payer: Self-pay

## 2023-07-22 ENCOUNTER — Ambulatory Visit
Admission: RE | Admit: 2023-07-22 | Discharge: 2023-07-22 | Disposition: A | Discharge status: Home / Self Care | Payer: Medicare Other | Source: Ambulatory Visit | Attending: Radiation Oncology | Admitting: Radiation Oncology

## 2023-07-22 DIAGNOSIS — Z510 Encounter for antineoplastic radiation therapy: Secondary | ICD-10-CM | POA: Diagnosis not present

## 2023-07-22 LAB — RAD ONC ARIA SESSION SUMMARY
Course Elapsed Days: 10
Plan Fractions Treated to Date: 7
Plan Prescribed Dose Per Fraction: 2 Gy
Plan Total Fractions Prescribed: 35
Plan Total Prescribed Dose: 70 Gy
Reference Point Dosage Given to Date: 14 Gy
Reference Point Session Dosage Given: 2 Gy
Session Number: 7

## 2023-07-23 ENCOUNTER — Inpatient Hospital Stay

## 2023-07-23 ENCOUNTER — Ambulatory Visit
Admission: RE | Admit: 2023-07-23 | Discharge: 2023-07-23 | Disposition: A | Discharge status: Home / Self Care | Payer: Medicare Other | Source: Ambulatory Visit | Attending: Radiation Oncology | Admitting: Radiation Oncology

## 2023-07-23 ENCOUNTER — Other Ambulatory Visit: Payer: Self-pay

## 2023-07-23 DIAGNOSIS — Z510 Encounter for antineoplastic radiation therapy: Secondary | ICD-10-CM | POA: Diagnosis not present

## 2023-07-23 LAB — RAD ONC ARIA SESSION SUMMARY
Course Elapsed Days: 11
Plan Fractions Treated to Date: 8
Plan Prescribed Dose Per Fraction: 2 Gy
Plan Total Fractions Prescribed: 35
Plan Total Prescribed Dose: 70 Gy
Reference Point Dosage Given to Date: 16 Gy
Reference Point Session Dosage Given: 2 Gy
Session Number: 8

## 2023-07-23 MED FILL — Fosaprepitant Dimeglumine For IV Infusion 150 MG (Base Eq): INTRAVENOUS | Qty: 5 | Status: AC

## 2023-07-23 NOTE — Progress Notes (Signed)
 Nutrition Follow-up:  Patient with right glossotonsillar sulcus mass, p 16+.  Patient receiving concurrent cisplatin and radiation.  Noted did not show up for consult for PEG placement  Met with patient and significant other [name] after radiation today.  Patient reports that his appetite is decreased.  Does not usually eat anything until after radiation.  Has been stopping at [facility] and getting frosty, sometimes cheese burger and few fries (sometimes dry).  Last night ate sloppy joe with cheese.  Has been able to eat roast chicken, green beans, flounder, fried okra, banana milkshake.  Has not really wanted protein shakes.  [name] has on hand pudding, rice pudding, jello, popsciles, etc.  Reports some pain with swallowing  Says that he was having issues with constipation and took a dose of miralax on 3/9 then had loose stool until 3/12.      Medications: oxycodone, prochlorperazine, zofran, decadron, metformin  Labs: reviewed from 3/3  Anthropometrics:   Weight 169 lb on 3/7, decreased  177 lb on 2/13 184 lb on 02/25/23  NUTRITION DIAGNOSIS: Inadequate oral intake continues with treatment related side effects    INTERVENTION:  Discussed feeding tube as option to supplement oral intake.  Wants to hold off at this time Recommend eating q 2-3 hours small amounts of soft, moist foods.  Example provided Reviewed ways to add calories and protein     MONITORING, EVALUATION, GOAL: weight trends, intake   NEXT VISIT: Thursday, Mar 20 after radiation  Donyae Kilner B. Elease Hashimoto, CSO, LDN Registered Dietitian [phone]

## 2023-07-26 ENCOUNTER — Ambulatory Visit

## 2023-07-26 ENCOUNTER — Ambulatory Visit: Admitting: Oncology

## 2023-07-26 ENCOUNTER — Other Ambulatory Visit

## 2023-07-26 ENCOUNTER — Inpatient Hospital Stay

## 2023-07-26 ENCOUNTER — Telehealth: Payer: Self-pay | Admitting: Psychiatry

## 2023-07-26 ENCOUNTER — Other Ambulatory Visit: Payer: Self-pay

## 2023-07-26 ENCOUNTER — Inpatient Hospital Stay (HOSPITAL_BASED_OUTPATIENT_CLINIC_OR_DEPARTMENT_OTHER): Admitting: Oncology

## 2023-07-26 ENCOUNTER — Encounter: Payer: Self-pay | Admitting: Oncology

## 2023-07-26 ENCOUNTER — Ambulatory Visit
Admission: RE | Admit: 2023-07-26 | Discharge: 2023-07-26 | Disposition: A | Discharge status: Home / Self Care | Payer: Medicare Other | Source: Ambulatory Visit | Attending: Radiation Oncology | Admitting: Radiation Oncology

## 2023-07-26 VITALS — BP 105/75 | HR 86 | Temp 95.0°F | Resp 19 | Ht 69.0 in | Wt 165.3 lb

## 2023-07-26 VITALS — BP 106/74 | HR 82

## 2023-07-26 DIAGNOSIS — Z5111 Encounter for antineoplastic chemotherapy: Secondary | ICD-10-CM | POA: Diagnosis not present

## 2023-07-26 DIAGNOSIS — Z510 Encounter for antineoplastic radiation therapy: Secondary | ICD-10-CM | POA: Diagnosis not present

## 2023-07-26 DIAGNOSIS — C109 Malignant neoplasm of oropharynx, unspecified: Secondary | ICD-10-CM | POA: Diagnosis not present

## 2023-07-26 DIAGNOSIS — G893 Neoplasm related pain (acute) (chronic): Secondary | ICD-10-CM

## 2023-07-26 LAB — CBC WITH DIFFERENTIAL (CANCER CENTER ONLY)
Abs Immature Granulocytes: 0.04 10*3/uL (ref 0.00–0.07)
Basophils Absolute: 0.1 10*3/uL (ref 0.0–0.1)
Basophils Relative: 1 %
Eosinophils Absolute: 0.1 10*3/uL (ref 0.0–0.5)
Eosinophils Relative: 1 %
HCT: 37.1 % — ABNORMAL LOW (ref 39.0–52.0)
Hemoglobin: 12.7 g/dL — ABNORMAL LOW (ref 13.0–17.0)
Immature Granulocytes: 0 %
Lymphocytes Relative: 11 %
Lymphs Abs: 1.1 10*3/uL (ref 0.7–4.0)
MCH: 30.4 pg (ref 26.0–34.0)
MCHC: 34.2 g/dL (ref 30.0–36.0)
MCV: 88.8 fL (ref 80.0–100.0)
Monocytes Absolute: 0.5 10*3/uL (ref 0.1–1.0)
Monocytes Relative: 5 %
Neutro Abs: 8.1 10*3/uL — ABNORMAL HIGH (ref 1.7–7.7)
Neutrophils Relative %: 82 %
Platelet Count: 146 10*3/uL — ABNORMAL LOW (ref 150–400)
RBC: 4.18 MIL/uL — ABNORMAL LOW (ref 4.22–5.81)
RDW: 13.1 % (ref 11.5–15.5)
WBC Count: 9.8 10*3/uL (ref 4.0–10.5)
nRBC: 0 % (ref 0.0–0.2)

## 2023-07-26 LAB — BASIC METABOLIC PANEL - CANCER CENTER ONLY
Anion gap: 8 (ref 5–15)
BUN: 15 mg/dL (ref 8–23)
CO2: 26 mmol/L (ref 22–32)
Calcium: 9.1 mg/dL (ref 8.9–10.3)
Chloride: 102 mmol/L (ref 98–111)
Creatinine: 0.83 mg/dL (ref 0.61–1.24)
GFR, Estimated: >60 mL/min (ref >60)
Glucose, Bld: 164 mg/dL — ABNORMAL HIGH (ref 70–99)
Potassium: 4 mmol/L (ref 3.5–5.1)
Sodium: 136 mmol/L (ref 135–145)

## 2023-07-26 LAB — RAD ONC ARIA SESSION SUMMARY
Course Elapsed Days: 14
Plan Fractions Treated to Date: 9
Plan Prescribed Dose Per Fraction: 2 Gy
Plan Total Fractions Prescribed: 35
Plan Total Prescribed Dose: 70 Gy
Reference Point Dosage Given to Date: 18 Gy
Reference Point Session Dosage Given: 2 Gy
Session Number: 9

## 2023-07-26 LAB — MAGNESIUM: Magnesium: 1.8 mg/dL (ref 1.7–2.4)

## 2023-07-26 MED ORDER — PALONOSETRON HCL INJECTION 0.25 MG/5ML
0.2500 mg | Freq: Once | INTRAVENOUS | Status: AC
  Administered 2023-07-26: 0.25 mg via INTRAVENOUS
  Filled 2023-07-26: qty 5

## 2023-07-26 MED ORDER — MAGNESIUM SULFATE 2 GM/50ML IV SOLN
2.0000 g | Freq: Once | INTRAVENOUS | Status: AC
Start: 2023-07-26 — End: 2023-07-26
  Administered 2023-07-26: 2 g via INTRAVENOUS
  Filled 2023-07-26: qty 50

## 2023-07-26 MED ORDER — MORPHINE SULFATE (PF) 2 MG/ML IV SOLN
4.0000 mg | Freq: Once | INTRAVENOUS | Status: AC
  Administered 2023-07-26: 4 mg via INTRAVENOUS
  Filled 2023-07-26: qty 2

## 2023-07-26 MED ORDER — HEPARIN SOD (PORK) LOCK FLUSH 100 UNIT/ML IV SOLN
500.0000 [IU] | Freq: Once | INTRAVENOUS | Status: AC | PRN
Start: 2023-07-26 — End: 2023-07-26
  Administered 2023-07-26: 500 [IU]
  Filled 2023-07-26: qty 5

## 2023-07-26 MED ORDER — POTASSIUM CHLORIDE IN NACL 20-0.9 MEQ/L-% IV SOLN
Freq: Once | INTRAVENOUS | Status: AC
  Filled 2023-07-26: qty 1000

## 2023-07-26 MED ORDER — DEXAMETHASONE SODIUM PHOSPHATE 10 MG/ML IJ SOLN
10.0000 mg | Freq: Once | INTRAMUSCULAR | Status: AC
  Administered 2023-07-26: 10 mg via INTRAVENOUS
  Filled 2023-07-26: qty 1

## 2023-07-26 MED ORDER — SODIUM CHLORIDE 0.9 % IV SOLN
40.0000 mg/m2 | Freq: Once | INTRAVENOUS | Status: AC
  Administered 2023-07-26: 79 mg via INTRAVENOUS
  Filled 2023-07-26: qty 79

## 2023-07-26 MED ORDER — FOSAPREPITANT DIMEGLUMINE INJECTION 150 MG
150.0000 mg | Freq: Once | INTRAVENOUS | Status: AC
  Administered 2023-07-26: 150 mg via INTRAVENOUS
  Filled 2023-07-26: qty 150
  Filled 2023-07-26: qty 5

## 2023-07-26 MED ORDER — OXYCODONE HCL 10 MG PO TABS
10.0000 mg | ORAL_TABLET | ORAL | 0 refills | Status: DC | PRN
Start: 2023-07-26 — End: 2023-08-25

## 2023-07-26 MED ORDER — SODIUM CHLORIDE 0.9 % IV SOLN
INTRAVENOUS | Status: DC
  Filled 2023-07-26: qty 250

## 2023-07-26 NOTE — Telephone Encounter (Signed)
 Called her due to concerns about his condition. She shared that they have found out his confusion occurs when he takes two tablets of oxycodone. He is now trying to maintain a single dose, and he appears to be doing better. His sleep has improved since taking clonazepam more frequently as prescribed. She does not feel a sooner appointment is necessary at this time but will call if they decide otherwise. She agrees to keep the scheduled appointment in April.

## 2023-07-26 NOTE — Patient Instructions (Signed)

## 2023-07-26 NOTE — Progress Notes (Signed)
 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  He was ultimately referred By his dentist to ENT and underwent CT soft tissue neck which showed a 4 cm partially necrotic mass at the glossotonsillar sulcus on the right side and to level 2 lymph nodes measuring 15 and 14 mm that appeared pathologic.  Core needle biopsy of the level 2 lymph node was consistent with squamous cell carcinoma.  Cells diffusely positive for p40 and p16.  Plan is to proceed with concurrent weekly cisplatin chemotherapy with radiation   Interval history-patient missed cycle 2 of weekly cisplatin chemotherapy last week because he is having some diarrhea.  He is better and his laxatives and bowel habits are currently regular.  He still reports a pain scale of about 4-5 out of 10 which has remained stable as compared to 2 weeks ago.  He is on fentanyl patch 25 mcg and as needed oxycodone which he uses 2-3 times a day.  Confusion is overall better and he has an appointment coming up with Dr. Vanetta Shawl in 2 weeks time  ECOG PS- 1 Pain scale- 4   Review of systems- Review of Systems  Constitutional:  Negative for chills, fever, malaise/fatigue and weight loss.  HENT:  Negative for congestion, ear discharge and nosebleeds.        Right-sided neck pain  Eyes:  Negative for blurred vision.  Respiratory:  Negative for  cough, hemoptysis, sputum production, shortness of breath and wheezing.   Cardiovascular:  Negative for chest pain, palpitations, orthopnea and claudication.  Gastrointestinal:  Negative for abdominal pain, blood in stool, constipation, diarrhea, heartburn, melena, nausea and vomiting.  Genitourinary:  Negative for dysuria, flank pain, frequency, hematuria and urgency.  Musculoskeletal:  Negative for back pain, joint pain and myalgias.  Skin:  Negative for rash.  Neurological:  Negative for dizziness, tingling, focal weakness, seizures, weakness and headaches.  Endo/Heme/Allergies:  Does not bruise/bleed easily.  Psychiatric/Behavioral:  Negative for depression and suicidal ideas. The patient does not have insomnia.       Allergies  Allergen Reactions   Mirtazapine Rash   Other Itching and Rash    ETCO2 tubing. Medtronic Microstream advance     Past Medical History:  Diagnosis Date   Anxiety    Arthritis    Carpal tunnel syndrome    Depression    Diabetes (HCC)    Hyperlipidemia    Hypertension    Loss of teeth due to extraction    Neoplasm related pain    Parkinson's disease [facility])      Past Surgical History:  Procedure Laterality Date   CARPAL TUNNEL RELEASE Right 11/28/2020   Procedure: CARPAL TUNNEL RELEASE ENDOSCOPIC;  Surgeon: Christena Flake, MD;  Location: [facility] ORS;  Service: Orthopedics;  Laterality: Right;   CARPAL TUNNEL RELEASE Left 01/22/2021   Procedure: CARPAL TUNNEL RELEASE ENDOSCOPIC;  Surgeon: Christena Flake, MD;  Location: [facility] ORS;  Service: Orthopedics;  Laterality: Left;   IR IMAGING GUIDED PORT INSERTION  06/30/2023   KNEE ARTHROSCOPY Right     Social History   Socioeconomic History   Marital status: Single    Spouse name: [name]   Number of children: Not on file   Years of education: Not on file   Highest education level: Not on file  Occupational History   Not on file  Tobacco Use   Smoking status: Never   Smokeless tobacco: Never  Vaping  Use   Vaping status: Never Used  Substance and Sexual Activity   Alcohol use: Not Currently   Drug use: Never   Sexual activity: Not Currently    Birth control/protection: None  Other Topics Concern   Not on file  Social History Narrative   Lives with friend   Social Drivers of Health   Financial Resource Strain: Not on file  Food Insecurity: No Food Insecurity (06/22/2023)   Hunger Vital Sign    Worried About Running Out of Food in the Last Year: Never true    Ran Out of Food in the Last Year: Never true  Transportation Needs: No Transportation Needs (06/22/2023)   PRAPARE - Administrator, Civil Service (Medical): No    Lack of Transportation (Non-Medical): No  Physical Activity: Not on file  Stress: Not on file  Social Connections: Not on file  Intimate Partner Violence: Not At Risk (06/22/2023)   Humiliation, Afraid, Rape, and Kick questionnaire    Fear of Current or Ex-Partner: No    Emotionally Abused: No    Physically Abused: No    Sexually Abused: No    Family History  Problem Relation Age of Onset   Diabetes Mother    Hypertension Mother    CVA Father    Heart attack Father    Depression Sister    Depression Brother      Current Outpatient Medications:    ARIPiprazole (ABILIFY) 30 MG tablet, Take 30 mg by mouth daily., Disp: , Rfl:    atenolol (TENORMIN) 50 MG tablet, Take 50 mg by mouth daily. (Patient not taking: Reported on 07/12/2023), Disp: , Rfl:    buPROPion (WELLBUTRIN XL) 150 MG 24 hr tablet, Take 1 tablet (150 mg total) by mouth daily. Total of 450 mg daily. Take along with 300 mg tab, Disp: 30 tablet, Rfl: 5   buPROPion (WELLBUTRIN XL) 300 MG 24 hr tablet, Take 1 tablet (300 mg total) by mouth daily. Total of 450 mg daily. Take along with 150 mg tab, Disp: 30 tablet, Rfl: 5   busPIRone (BUSPAR) 7.5 MG tablet, Take 1 tablet (7.5 mg total) by mouth 2 (two) times daily., Disp: 60 tablet, Rfl: 3   carbidopa-levodopa (SINEMET IR) 25-100 MG  tablet, Take 1 tablet by mouth 5 (five) times daily. 3 in morning 2 in evening, Disp: , Rfl:    clonazePAM (KLONOPIN) 1 MG tablet, May take 1 tablet (1 mg total) by mouth 2 (two) times daily as needed for anxiety. May also take 0.5-1 tablets (0.5-1 mg total) at bedtime as needed for anxiety., Disp: 90 tablet, Rfl: 1   dexamethasone (DECADRON) 2 MG tablet, Take 1 tablet by mouth daily x 1 week, then take 1/2 tablet daily x 1 week. Do not abruptly stop., Disp: 14 tablet, Rfl: 1   dexamethasone (DECADRON) 4 MG tablet, Take 2 tablets (8 mg) by mouth daily x 3 days starting the day after cisplatin chemotherapy. Take  with food., Disp: 30 tablet, Rfl: 1   fentaNYL (DURAGESIC) 25 MCG/HR, Place 1 patch onto the skin every 3 (three) days., Disp: 5 patch, Rfl: 0   ketoconazole (NIZORAL) 2 % shampoo, Apply 1 application topically every other day. (Patient not taking: Reported on 06/22/2023), Disp: , Rfl:    lidocaine-prilocaine (EMLA) cream, Apply to affected area once, Disp: 30 g, Rfl: 3   metFORMIN (GLUCOPHAGE) 500 MG tablet, Take 500 mg by mouth daily with breakfast., Disp: , Rfl:    methylphenidate (RITALIN) 5 MG tablet, Take 1 tablet (5 mg total) by mouth daily. (Patient not taking: Reported on 07/16/2023), Disp: 30 tablet, Rfl: 0   ondansetron (ZOFRAN) 8 MG tablet, Take 1 tablet (8 mg total) by mouth every 8 (eight) hours as needed for nausea or vomiting. Start on the third day after cisplatin., Disp: 30 tablet, Rfl: 1   Oxycodone HCl 10 MG TABS, Take 1 tablet (10 mg total) by mouth every 4 (four) hours as needed., Disp: 90 tablet, Rfl: 0   prochlorperazine (COMPAZINE) 10 MG tablet, Take 1 tablet (10 mg total) by mouth every 6 (six) hours as needed (Nausea or vomiting)., Disp: 30 tablet, Rfl: 1   rosuvastatin (CRESTOR) 20 MG tablet, Take 20 mg by mouth daily., Disp: , Rfl:    sertraline (ZOLOFT) 100 MG tablet, Take 1.5 tablets (150 mg total) by mouth at bedtime., Disp: 45 tablet, Rfl: 3   sildenafil (VIAGRA) 50  MG tablet, Take 50 mg by mouth daily as needed for erectile dysfunction., Disp: , Rfl:    tadalafil (CIALIS) 5 MG tablet, Take 5 mg by mouth daily., Disp: , Rfl:    tamsulosin (FLOMAX) 0.4 MG CAPS capsule, Take by mouth., Disp: , Rfl:    traZODone (DESYREL) 50 MG tablet, Take 0.5-2 tablets (25-100 mg total) by mouth at bedtime as needed for sleep., Disp: 60 tablet, Rfl: 1   VICTOZA 18 MG/3ML SOPN, Inject 1.2 mg into the skin daily. (Patient not taking: Reported on 06/22/2023), Disp: , Rfl:  No current facility-administered medications for this visit.  Facility-Administered Medications Ordered in Other Visits:    0.9 %  sodium chloride infusion, , Intravenous, Continuous, Creig Hines, MD, Last Rate: 10 mL/hr at 07/26/23 1024, New Bag at 07/26/23 1024   CISplatin (PLATINOL) 79 mg in sodium chloride 0.9 % 250 mL chemo infusion, 40 mg/m2 (Treatment Plan Recorded), Intravenous, Once, Creig Hines, MD   dexamethasone (DECADRON) injection 10 mg, 10 mg, Intravenous, Once, Creig Hines, MD   fosaprepitant (EMEND) 150 mg in sodium chloride 0.9 % 145 mL IVPB, 150 mg, Intravenous, Once, Creig Hines, MD   morphine (PF) 2 MG/ML injection 4 mg, 4 mg, Intravenous, Once, Creig Hines, MD   palonosetron (ALOXI) injection 0.25 mg, 0.25 mg, Intravenous, Once, Creig Hines, MD  Physical exam:  Vitals:   07/26/23 0931  BP: 105/75  Pulse: 86  Resp: 19  Temp: (!) 95 F (35 C)  TempSrc: Tympanic  SpO2: 98%  Weight: 165 lb 4.8 oz (75 kg)  Height: 5\' 9"  (1.753 m)   Physical Exam HENT:     Mouth/Throat:     Mouth: Mucous membranes are moist.     Pharynx: Oropharynx is clear.     Comments: Right tonsillar mass visible Cardiovascular:     Rate and Rhythm: Normal rate and regular rhythm.     Heart sounds: Normal heart sounds.  Pulmonary:     Effort: Pulmonary effort is normal.  Breath sounds: Normal breath sounds.  Skin:    General: Skin is warm and dry.  Neurological:     Mental Status:  He is alert and oriented to person, place, and time.         Latest Ref Rng & Units 07/26/2023    8:59 AM  CMP  Glucose 70 - 99 mg/dL 130   BUN 8 - 23 mg/dL 15   Creatinine 8.65 - 1.24 mg/dL 7.84   Sodium 696 - 295 mmol/L 136   Potassium 3.5 - 5.1 mmol/L 4.0   Chloride 98 - 111 mmol/L 102   CO2 22 - 32 mmol/L 26   Calcium 8.9 - 10.3 mg/dL 9.1       Latest Ref Rng & Units 07/26/2023    8:59 AM  CBC  WBC 4.0 - 10.5 K/uL 9.8   Hemoglobin 13.0 - 17.0 g/dL 28.4   Hematocrit 13.2 - 52.0 % 37.1   Platelets 150 - 400 K/uL 146     No images are attached to the encounter.  NM PET Image Initial (PI) Skull Base To Thigh Result Date: 07/06/2023 CLINICAL DATA:  Initial treatment strategy for neoplasm of the oropharynx. EXAM: NUCLEAR MEDICINE PET SKULL BASE TO THIGH TECHNIQUE: 9.7 mCi F-18 FDG was injected intravenously. Full-ring PET imaging was performed from the skull base to thigh after the radiotracer. CT data was obtained and used for attenuation correction and anatomic localization. Fasting blood glucose: 106 mg/dl COMPARISON:  Or is FINDINGS: Mediastinal blood pool activity: SUV max 2.2 Liver activity: SUV max NA NECK: Large round mass in the posterior RIGHT oropharynx centered about the lingual and palatine tonsil measures 3.4 x 3.7 cm and has intense metabolic activity with SUV max equal 13.3. Hypermetabolic mass appears confined to the pharyngeal mucosa. Adjacent intense hypermetabolic LEFT level 2 lymph node anterior to the RIGHT sternocleidomastoid muscle measures 12 mm on image 27 with SUV max equal 9.9. Second hypermetabolic level 2 lymph node beneath the sternocleidomastoid muscle measures 12 mm on image 31/6 with SUV max equal 5.1. No hypermetabolic contralateral cervical nodes. No supraclavicular adenopathy. Incidental CT findings: None. CHEST: No hypermetabolic mediastinal or hilar nodes. No suspicious pulmonary nodules on the CT scan. Incidental CT findings: Port in the anterior  chest wall with tip in distal SVC. ABDOMEN/PELVIS: No abnormal hypermetabolic activity within the liver, pancreas, adrenal glands, or spleen. No hypermetabolic lymph nodes in the abdomen or pelvis. Incidental CT findings: None. SKELETON: No focal hypermetabolic activity to suggest skeletal metastasis. Incidental CT findings: None. IMPRESSION: 1. Hypermetabolic mass in the posterior RIGHT oropharynx consistent with primary head neck carcinoma. 2. Two hypermetabolic RIGHT level 2 lymph nodes consistent with nodal metastasis. 3. No evidence distant metastatic disease. Electronically Signed   By: Genevive Bi M.D.   On: 07/06/2023 12:41   IR IMAGING GUIDED PORT INSERTION Result Date: 06/30/2023 INDICATION: 68 year old male with head and neck cancer. He presents for port catheter placement to establish durable venous access for chemotherapy. EXAM: IMPLANTED PORT A CATH PLACEMENT WITH ULTRASOUND AND FLUOROSCOPIC GUIDANCE MEDICATIONS: None. ANESTHESIA/SEDATION: Versed 2 mg IV; Fentanyl 75 mcg IV; administered by the radiology nurse. Moderate Sedation Time:  18 minutes The patient's vital signs and level of consciousness were continuously monitored during the procedure by the interventional radiology nurse under my direct supervision. FLUOROSCOPY: Radiation exposure index: 4 mGy reference air kerma COMPLICATIONS: None immediate. PROCEDURE: The right neck and chest was prepped with chlorhexidine, and draped in the usual sterile fashion using maximum barrier  technique (cap and mask, sterile gown, sterile gloves, large sterile sheet, hand hygiene and cutaneous antiseptic). Local anesthesia was attained by infiltration with 1% lidocaine with epinephrine. Ultrasound demonstrated patency of the right internal jugular vein, and this was documented with an image. Under real-time ultrasound guidance, this vein was accessed with a 21 gauge micropuncture needle and image documentation was performed. A small dermatotomy was made  at the access site with an 11 scalpel. A 0.018" wire was advanced into the SVC and the access needle exchanged for a 19F micropuncture vascular sheath. The 0.018" wire was then removed and a 0.035" wire advanced into the IVC. An appropriate location for the subcutaneous reservoir was selected below the clavicle and an incision was made through the skin and underlying soft tissues. The subcutaneous tissues were then dissected using a combination of blunt and sharp surgical technique and a pocket was formed. A Bard Clear Vue single lumen power injectable portacatheter was then tunneled through the subcutaneous tissues from the pocket to the dermatotomy and the port reservoir placed within the subcutaneous pocket. The venous access site was then serially dilated and a peel away vascular sheath placed over the wire. The wire was removed and the port catheter advanced into position under fluoroscopic guidance. The catheter tip is positioned in the superior cavoatrial junction. This was documented with a spot image. The portacatheter was then tested and found to flush and aspirate well. The port was flushed with saline followed by 100 units/mL heparinized saline. The pocket was then closed in two layers using first subdermal inverted interrupted absorbable sutures followed by a running subcuticular suture. The epidermis was then sealed with Dermabond. The dermatotomy at the venous access site was also closed with Dermabond. IMPRESSION: Successful placement of a right IJ approach Bard Clear Vue portacatheter with ultrasound and fluoroscopic guidance. The catheter is ready for use. Electronically Signed   By: Malachy Moan M.D.   On: 06/30/2023 15:21     Assessment and plan- Patient is a 68 y.o. 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  He is here for on treatment assessment prior to cycle 2 of weekly cisplatin chemotherapy  Counts okay to proceed with cycle 2 of weekly  cisplatin chemotherapy today.  He will directly proceed for cycle 3 next week and I will see him back in 2 weeks for cycle 4  Neoplasm related pain: Continue fentanyl patch 25 mcg and as needed oxycodone.  Oxycodone prescription renewed today.  He has lost about 5 pounds in the last couple of weeks.  I have encouraged him to continue protein supplements and try to keep up with his oral intake.  We are holding off on feeding tube consideration at this time.   Visit Diagnosis 1. Squamous cell carcinoma of oropharynx (HCC)   2. Encounter for antineoplastic chemotherapy   3. Neoplasm related pain      Dr. Owens Shark, MD, MPH [facility] [phone] 07/26/2023 12:48 PM

## 2023-07-27 ENCOUNTER — Other Ambulatory Visit: Payer: Self-pay

## 2023-07-27 ENCOUNTER — Ambulatory Visit
Admission: RE | Admit: 2023-07-27 | Discharge: 2023-07-27 | Disposition: A | Discharge status: Home / Self Care | Payer: Medicare Other | Source: Ambulatory Visit | Attending: Radiation Oncology | Admitting: Radiation Oncology

## 2023-07-27 DIAGNOSIS — Z510 Encounter for antineoplastic radiation therapy: Secondary | ICD-10-CM | POA: Diagnosis not present

## 2023-07-27 LAB — RAD ONC ARIA SESSION SUMMARY
Course Elapsed Days: 15
Plan Fractions Treated to Date: 10
Plan Prescribed Dose Per Fraction: 2 Gy
Plan Total Fractions Prescribed: 35
Plan Total Prescribed Dose: 70 Gy
Reference Point Dosage Given to Date: 20 Gy
Reference Point Session Dosage Given: 2 Gy
Session Number: 10

## 2023-07-28 ENCOUNTER — Other Ambulatory Visit: Payer: Self-pay

## 2023-07-28 ENCOUNTER — Ambulatory Visit
Admission: RE | Admit: 2023-07-28 | Discharge: 2023-07-28 | Disposition: A | Discharge status: Home / Self Care | Payer: Medicare Other | Source: Ambulatory Visit | Attending: Radiation Oncology | Admitting: Radiation Oncology

## 2023-07-28 DIAGNOSIS — F329 Major depressive disorder, single episode, unspecified: Secondary | ICD-10-CM | POA: Diagnosis not present

## 2023-07-28 DIAGNOSIS — Z510 Encounter for antineoplastic radiation therapy: Secondary | ICD-10-CM | POA: Diagnosis not present

## 2023-07-28 DIAGNOSIS — C109 Malignant neoplasm of oropharynx, unspecified: Secondary | ICD-10-CM | POA: Diagnosis not present

## 2023-07-28 LAB — RAD ONC ARIA SESSION SUMMARY
Course Elapsed Days: 16
Plan Fractions Treated to Date: 11
Plan Prescribed Dose Per Fraction: 2 Gy
Plan Total Fractions Prescribed: 35
Plan Total Prescribed Dose: 70 Gy
Reference Point Dosage Given to Date: 22 Gy
Reference Point Session Dosage Given: 2 Gy
Session Number: 11

## 2023-07-29 ENCOUNTER — Other Ambulatory Visit: Payer: Self-pay

## 2023-07-29 ENCOUNTER — Ambulatory Visit
Admission: RE | Admit: 2023-07-29 | Discharge: 2023-07-29 | Disposition: A | Discharge status: Home / Self Care | Payer: Medicare Other | Source: Ambulatory Visit | Attending: Radiation Oncology | Admitting: Radiation Oncology

## 2023-07-29 ENCOUNTER — Inpatient Hospital Stay

## 2023-07-29 ENCOUNTER — Encounter: Payer: Self-pay | Admitting: Oncology

## 2023-07-29 DIAGNOSIS — Z510 Encounter for antineoplastic radiation therapy: Secondary | ICD-10-CM | POA: Diagnosis not present

## 2023-07-29 LAB — RAD ONC ARIA SESSION SUMMARY
Course Elapsed Days: 17
Plan Fractions Treated to Date: 12
Plan Prescribed Dose Per Fraction: 2 Gy
Plan Total Fractions Prescribed: 35
Plan Total Prescribed Dose: 70 Gy
Reference Point Dosage Given to Date: 24 Gy
Reference Point Session Dosage Given: 2 Gy
Session Number: 12

## 2023-07-29 MED ORDER — FENTANYL 25 MCG/HR TD PT72: 1.0000 | MEDICATED_PATCH | TRANSDERMAL | 0 refills | Status: DC

## 2023-07-29 NOTE — Telephone Encounter (Signed)
 Refill request pended and forwarded to [facility]

## 2023-07-29 NOTE — Progress Notes (Signed)
 Nutrition Follow-up:  Patient with right glossotonsillar sulcus mass, p 16+.  Patient receiving concurrent radiation and cisplatin.  Patient did not show up for PEG placement consultation  Met with patient and significant other Ricky [name] after radiation today.  Patient able to drink a shake yesterday am.  Ate a frosty yesterday after radiation and 1/2 burger.  Ate the other 1/2 later yesterday.  Snacks on cookies.  Ricky [name] thinks patient had ensure plus. Has also gotten him [employer] shakes.  Reports some jaw pain and some pain swallowing but still able to get food down without much difficulty.      Medications: decadron,  Labs: reviewed  Anthropometrics:   Weight 165 lb 4.8 oz on 3/17  169 lb on 3/7  177 lb on 2/13 184 lb on 02/25/23    NUTRITION DIAGNOSIS: Inadequate oral intake continues with treatment related side effects    INTERVENTION:  Previously declined feeding tube Encouraged patient to increase oral nutrition supplement to at least BID.  Says that he can do this.  Ideally would prefer 350+ calorie shake due to low intake and weight decreasing.  Monitor blood glucose     MONITORING, EVALUATION, GOAL: weight trends, intake   NEXT VISIT: Friday, Mar 28 after radiation  Ricky [name] B. Ricky [name], CSO, LDN Registered Dietitian [phone]

## 2023-07-30 ENCOUNTER — Ambulatory Visit
Admission: RE | Admit: 2023-07-30 | Discharge: 2023-07-30 | Disposition: A | Discharge status: Home / Self Care | Payer: Medicare Other | Source: Ambulatory Visit | Attending: Radiation Oncology | Admitting: Radiation Oncology

## 2023-07-30 ENCOUNTER — Other Ambulatory Visit: Payer: Self-pay

## 2023-07-30 DIAGNOSIS — Z510 Encounter for antineoplastic radiation therapy: Secondary | ICD-10-CM | POA: Diagnosis not present

## 2023-07-30 LAB — RAD ONC ARIA SESSION SUMMARY
Course Elapsed Days: 18
Plan Fractions Treated to Date: 13
Plan Prescribed Dose Per Fraction: 2 Gy
Plan Total Fractions Prescribed: 35
Plan Total Prescribed Dose: 70 Gy
Reference Point Dosage Given to Date: 26 Gy
Reference Point Session Dosage Given: 2 Gy
Session Number: 13

## 2023-07-30 MED FILL — Fosaprepitant Dimeglumine For IV Infusion 150 MG (Base Eq): INTRAVENOUS | Qty: 5 | Status: AC

## 2023-08-01 ENCOUNTER — Encounter: Payer: Self-pay | Admitting: Oncology

## 2023-08-01 NOTE — Progress Notes (Signed)
 Patient enrolled in [facility] services on 07/21/23. Patient scheduled for initial behavioral health evaluation on 07/28/23.

## 2023-08-02 ENCOUNTER — Ambulatory Visit
Admission: RE | Admit: 2023-08-02 | Discharge: 2023-08-02 | Disposition: A | Discharge status: Home / Self Care | Payer: Medicare Other | Source: Ambulatory Visit | Attending: Radiation Oncology | Admitting: Radiation Oncology

## 2023-08-02 ENCOUNTER — Other Ambulatory Visit: Payer: Self-pay

## 2023-08-02 ENCOUNTER — Encounter: Payer: Self-pay | Admitting: Oncology

## 2023-08-02 ENCOUNTER — Inpatient Hospital Stay

## 2023-08-02 ENCOUNTER — Ambulatory Visit: Admitting: Oncology

## 2023-08-02 VITALS — BP 106/71 | HR 84 | Temp 97.6°F | Resp 18

## 2023-08-02 DIAGNOSIS — Z510 Encounter for antineoplastic radiation therapy: Secondary | ICD-10-CM | POA: Diagnosis not present

## 2023-08-02 DIAGNOSIS — C109 Malignant neoplasm of oropharynx, unspecified: Secondary | ICD-10-CM

## 2023-08-02 DIAGNOSIS — Z5111 Encounter for antineoplastic chemotherapy: Secondary | ICD-10-CM | POA: Diagnosis not present

## 2023-08-02 LAB — RAD ONC ARIA SESSION SUMMARY
Course Elapsed Days: 21
Plan Fractions Treated to Date: 14
Plan Prescribed Dose Per Fraction: 2 Gy
Plan Total Fractions Prescribed: 35
Plan Total Prescribed Dose: 70 Gy
Reference Point Dosage Given to Date: 28 Gy
Reference Point Session Dosage Given: 2 Gy
Session Number: 14

## 2023-08-02 LAB — CBC WITH DIFFERENTIAL (CANCER CENTER ONLY)
Abs Immature Granulocytes: 0.03 10*3/uL (ref 0.00–0.07)
Basophils Absolute: 0.1 10*3/uL (ref 0.0–0.1)
Basophils Relative: 1 %
Eosinophils Absolute: 0.1 10*3/uL (ref 0.0–0.5)
Eosinophils Relative: 2 %
HCT: 34.9 % — ABNORMAL LOW (ref 39.0–52.0)
Hemoglobin: 12.1 g/dL — ABNORMAL LOW (ref 13.0–17.0)
Immature Granulocytes: 0 %
Lymphocytes Relative: 10 %
Lymphs Abs: 0.7 10*3/uL (ref 0.7–4.0)
MCH: 30.6 pg (ref 26.0–34.0)
MCHC: 34.7 g/dL (ref 30.0–36.0)
MCV: 88.1 fL (ref 80.0–100.0)
Monocytes Absolute: 0.6 10*3/uL (ref 0.1–1.0)
Monocytes Relative: 8 %
Neutro Abs: 6.2 10*3/uL (ref 1.7–7.7)
Neutrophils Relative %: 79 %
Platelet Count: 149 10*3/uL — ABNORMAL LOW (ref 150–400)
RBC: 3.96 MIL/uL — ABNORMAL LOW (ref 4.22–5.81)
RDW: 12.7 % (ref 11.5–15.5)
WBC Count: 7.7 10*3/uL (ref 4.0–10.5)
nRBC: 0 % (ref 0.0–0.2)

## 2023-08-02 LAB — BASIC METABOLIC PANEL - CANCER CENTER ONLY
Anion gap: 11 (ref 5–15)
BUN: 19 mg/dL (ref 8–23)
CO2: 24 mmol/L (ref 22–32)
Calcium: 8.9 mg/dL (ref 8.9–10.3)
Chloride: 98 mmol/L (ref 98–111)
Creatinine: 1.24 mg/dL (ref 0.61–1.24)
GFR, Estimated: >60 mL/min (ref >60)
Glucose, Bld: 168 mg/dL — ABNORMAL HIGH (ref 70–99)
Potassium: 4 mmol/L (ref 3.5–5.1)
Sodium: 133 mmol/L — ABNORMAL LOW (ref 135–145)

## 2023-08-02 LAB — MAGNESIUM: Magnesium: 1.6 mg/dL — ABNORMAL LOW (ref 1.7–2.4)

## 2023-08-02 MED ORDER — SODIUM CHLORIDE 0.9 % IV SOLN
150.0000 mg | Freq: Once | INTRAVENOUS | Status: AC
  Administered 2023-08-02: 150 mg via INTRAVENOUS
  Filled 2023-08-02: qty 150

## 2023-08-02 MED ORDER — POTASSIUM CHLORIDE IN NACL 20-0.9 MEQ/L-% IV SOLN
Freq: Once | INTRAVENOUS | Status: AC
  Filled 2023-08-02: qty 1000

## 2023-08-02 MED ORDER — HEPARIN SOD (PORK) LOCK FLUSH 100 UNIT/ML IV SOLN
500.0000 [IU] | Freq: Once | INTRAVENOUS | Status: DC | PRN
  Filled 2023-08-02: qty 5

## 2023-08-02 MED ORDER — SODIUM CHLORIDE 0.9 % IV SOLN
INTRAVENOUS | Status: DC
  Filled 2023-08-02: qty 250

## 2023-08-02 MED ORDER — DEXAMETHASONE SODIUM PHOSPHATE 10 MG/ML IJ SOLN
10.0000 mg | Freq: Once | INTRAMUSCULAR | Status: AC
  Administered 2023-08-02: 10 mg via INTRAVENOUS
  Filled 2023-08-02: qty 1

## 2023-08-02 MED ORDER — MAGNESIUM SULFATE 4 GM/100ML IV SOLN
4.0000 g | Freq: Once | INTRAVENOUS | Status: AC
  Administered 2023-08-02: 4 g via INTRAVENOUS
  Filled 2023-08-02: qty 100

## 2023-08-02 MED ORDER — SODIUM CHLORIDE 0.9 % IV SOLN
40.0000 mg/m2 | Freq: Once | INTRAVENOUS | Status: AC
  Administered 2023-08-02: 79 mg via INTRAVENOUS
  Filled 2023-08-02: qty 79

## 2023-08-02 MED ORDER — PALONOSETRON HCL INJECTION 0.25 MG/5ML
0.2500 mg | Freq: Once | INTRAVENOUS | Status: AC
  Administered 2023-08-02: 0.25 mg via INTRAVENOUS
  Filled 2023-08-02: qty 5

## 2023-08-02 NOTE — Progress Notes (Signed)
 Per MD ok to run post hydration with Cistplatin.

## 2023-08-02 NOTE — Patient Instructions (Signed)
 [facility] - A DEPT OF [facility]  Discharge Instructions: Thank you for choosing [facility] to provide your oncology and hematology care.  If you have a lab appointment with the Cancer Center, please go directly to the Cancer Center and check in at the registration area.  Wear comfortable clothing and clothing appropriate for easy access to any Portacath or PICC line.   We strive to give you quality time with your provider. You may need to reschedule your appointment if you arrive late (15 or more minutes).  Arriving late affects you and other patients whose appointments are after yours.  Also, if you miss three or more appointments without notifying the office, you may be dismissed from the clinic at the provider's discretion.      For prescription refill requests, have your pharmacy contact our office and allow 72 hours for refills to be completed.    Today you received the following chemotherapy and/or immunotherapy agents Cisplatin      To help prevent nausea and vomiting after your treatment, we encourage you to take your nausea medication as directed.  BELOW ARE SYMPTOMS THAT SHOULD BE REPORTED IMMEDIATELY: *FEVER GREATER THAN 100.4 F (38 C) OR HIGHER *CHILLS OR SWEATING *NAUSEA AND VOMITING THAT IS NOT CONTROLLED WITH YOUR NAUSEA MEDICATION *UNUSUAL SHORTNESS OF BREATH *UNUSUAL BRUISING OR BLEEDING *URINARY PROBLEMS (pain or burning when urinating, or frequent urination) *BOWEL PROBLEMS (unusual diarrhea, constipation, pain near the anus) TENDERNESS IN MOUTH AND THROAT WITH OR WITHOUT PRESENCE OF ULCERS (sore throat, sores in mouth, or a toothache) UNUSUAL RASH, SWELLING OR PAIN  UNUSUAL VAGINAL DISCHARGE OR ITCHING   Items with * indicate a potential emergency and should be followed up as soon as possible or go to the Emergency Department if any problems should occur.  Please show the CHEMOTHERAPY ALERT CARD or IMMUNOTHERAPY  ALERT CARD at check-in to the Emergency Department and triage nurse.  Should you have questions after your visit or need to cancel or reschedule your appointment, please contact [facility] - A DEPT OF [facility]  [phone] and follow the prompts.  Office hours are 8:00 a.m. to 4:30 p.m. Monday - Friday. Please note that voicemails left after 4:00 p.m. may not be returned until the following business day.  We are closed weekends and major holidays. You have access to a nurse at all times for urgent questions. Please call the main number to the clinic [phone] and follow the prompts.  For any non-urgent questions, you may also contact your provider using MyChart. We now offer e-Visits for anyone 83 and older to request care online for non-urgent symptoms. For details visit [email or web address].   Also download the MyChart app! Go to the app store, search "MyChart", open the app, select [facility], and log in with your MyChart username and password.

## 2023-08-02 NOTE — Progress Notes (Signed)
 Pt has not voided prior to cisplatin treatment. MD made aware. States ok to proceed with treatment.

## 2023-08-03 ENCOUNTER — Other Ambulatory Visit: Payer: Self-pay

## 2023-08-03 ENCOUNTER — Encounter: Payer: Self-pay | Admitting: Oncology

## 2023-08-03 ENCOUNTER — Telehealth: Payer: Self-pay

## 2023-08-03 ENCOUNTER — Other Ambulatory Visit: Payer: Self-pay | Admitting: *Deleted

## 2023-08-03 ENCOUNTER — Ambulatory Visit
Admission: RE | Admit: 2023-08-03 | Discharge: 2023-08-03 | Disposition: A | Discharge status: Home / Self Care | Payer: Medicare Other | Source: Ambulatory Visit | Attending: Radiation Oncology | Admitting: Radiation Oncology

## 2023-08-03 ENCOUNTER — Telehealth: Payer: Self-pay | Admitting: Oncology

## 2023-08-03 DIAGNOSIS — Z510 Encounter for antineoplastic radiation therapy: Secondary | ICD-10-CM | POA: Diagnosis not present

## 2023-08-03 DIAGNOSIS — K1230 Oral mucositis (ulcerative), unspecified: Secondary | ICD-10-CM

## 2023-08-03 DIAGNOSIS — C109 Malignant neoplasm of oropharynx, unspecified: Secondary | ICD-10-CM

## 2023-08-03 LAB — RAD ONC ARIA SESSION SUMMARY
Course Elapsed Days: 22
Plan Fractions Treated to Date: 15
Plan Prescribed Dose Per Fraction: 2 Gy
Plan Total Fractions Prescribed: 35
Plan Total Prescribed Dose: 70 Gy
Reference Point Dosage Given to Date: 30 Gy
Reference Point Session Dosage Given: 2 Gy
Session Number: 15

## 2023-08-03 MED ORDER — MAGIC MOUTHWASH W/LIDOCAINE: 5.0000 mL | Freq: Four times a day (QID) | ORAL | 3 refills | Status: DC | PRN

## 2023-08-03 MED ORDER — STERILE WATER FOR INJECTION IJ SOLN
5.0000 mL | Freq: Four times a day (QID) | ORAL | 3 refills | Status: DC
Start: 2023-08-03 — End: 2023-09-29
  Filled 2023-08-03: qty 480, 12d supply, fill #0

## 2023-08-03 MED ORDER — SUCRALFATE 1 G PO TABS: 1.0000 g | ORAL_TABLET | Freq: Three times a day (TID) | ORAL | 1 refills | Status: DC

## 2023-08-03 NOTE — Telephone Encounter (Signed)
 [facility] called back to answering service and said that his mouthwash was not covered by his insurance. [phone] They are requesting a call back.

## 2023-08-03 NOTE — Telephone Encounter (Signed)
 Pt returned phone call. Wife given directions on address to [facility]. She will go get rx for pt

## 2023-08-03 NOTE — Telephone Encounter (Signed)
 left message indicating response was sent via my chart message regarding inquiry received earlier today and to call if there are any additional questions / concerns.

## 2023-08-04 ENCOUNTER — Other Ambulatory Visit: Payer: Self-pay

## 2023-08-04 ENCOUNTER — Ambulatory Visit

## 2023-08-04 ENCOUNTER — Ambulatory Visit: Payer: Medicare Other

## 2023-08-05 ENCOUNTER — Ambulatory Visit

## 2023-08-05 ENCOUNTER — Ambulatory Visit: Payer: Medicare Other

## 2023-08-06 ENCOUNTER — Ambulatory Visit

## 2023-08-06 ENCOUNTER — Ambulatory Visit: Payer: Medicare Other

## 2023-08-06 ENCOUNTER — Inpatient Hospital Stay

## 2023-08-06 ENCOUNTER — Telehealth: Payer: Self-pay | Admitting: *Deleted

## 2023-08-06 MED FILL — Fosaprepitant Dimeglumine For IV Infusion 150 MG (Base Eq): INTRAVENOUS | Qty: 5 | Status: AC

## 2023-08-06 NOTE — Progress Notes (Signed)
 [facility] MD/PA/NP OP Progress Note  08/10/2023 1:40 PM Ricky [name]  MRN:  [id number]  Chief Complaint:  Chief Complaint  Patient presents with   Follow-up   HPI:  This is a follow-up appointment for depression, anxiety and insomnia.  He states that he tends to be worried more during the day.  He has been able to sleep better at night since taking clonazepam.  He experiences side effect of dry mouth, dysguesia since starting treatment.  He tries to drink boost or other protein shake.  His pain is good as well as he is on the current medication.  He notices that he might have a little more shaking in his hands (though not noticeable on exam).  He has anxiety when he sees anybody new in the treatment center, although it is not bad.  He has "anxiety attack" of getting more emotional.  He had an episode of crying in the morning.  He feels tired of going to the cancer treatment.  He feels drained.  Although he was notified that the mass appears to be getting smaller, this makes him worried more, wondering what would happen after he is done with the treatment.  He tends to have this thought pattern since 21, but he believes it has been worse lately.  Validated her fear and concern.  He is willing to keep up the appointment with Ms. Perkins for therapy.   [name], his significant other presents to the visit.  He appears to be doing good with the current pain medication.  They were able to figure out the right balance. Phill tend to be worried, which is worse lately. He tends to brush off the positives. She agrees with the plans as outlined below.    Wt Readings from Last 3 Encounters:  08/10/23 163 lb 9.6 oz (74.2 kg)  08/09/23 162 lb 7.7 oz (73.7 kg)  07/26/23 165 lb 4.8 oz (75 kg)     Visit Diagnosis:    ICD-10-CM   1. MDD (major depressive disorder), recurrent episode, moderate (HCC)  F33.1     2. Anxiety state  F41.1     3. Insomnia, unspecified type  G47.00       Past Psychiatric History:  Please see initial evaluation for full details. I have reviewed the history. No updates at this time.     Past Medical History:  Past Medical History:  Diagnosis Date   Anxiety    Arthritis    Carpal tunnel syndrome    Depression    Diabetes (HCC)    Hyperlipidemia    Hypertension    Loss of teeth due to extraction    Neoplasm related pain    Parkinson's disease [facility])     Past Surgical History:  Procedure Laterality Date   CARPAL TUNNEL RELEASE Right 11/28/2020   Procedure: CARPAL TUNNEL RELEASE ENDOSCOPIC;  Surgeon: Christena Flake, MD;  Location: [facility] ORS;  Service: Orthopedics;  Laterality: Right;   CARPAL TUNNEL RELEASE Left 01/22/2021   Procedure: CARPAL TUNNEL RELEASE ENDOSCOPIC;  Surgeon: Christena Flake, MD;  Location: [facility] ORS;  Service: Orthopedics;  Laterality: Left;   IR IMAGING GUIDED PORT INSERTION  06/30/2023   KNEE ARTHROSCOPY Right     Family Psychiatric History: Please see initial evaluation for full details. I have reviewed the history. No updates at this time.     Family History:  Family History  Problem Relation Age of Onset   Diabetes Mother    Hypertension Mother  CVA Father    Heart attack Father    Depression Sister    Depression Brother     Social History:  Social History   Socioeconomic History   Marital status: Single    Spouse name: [name]   Number of children: Not on file   Years of education: Not on file   Highest education level: Not on file  Occupational History   Not on file  Tobacco Use   Smoking status: Never   Smokeless tobacco: Never  Vaping Use   Vaping status: Never Used  Substance and Sexual Activity   Alcohol use: Not Currently   Drug use: Never   Sexual activity: Not Currently    Birth control/protection: None  Other Topics Concern   Not on file  Social History Narrative   Lives with friend   Social Drivers of Health   Financial Resource Strain: Not on file  Food Insecurity: No Food Insecurity (06/22/2023)    Hunger Vital Sign    Worried About Running Out of Food in the Last Year: Never true    Ran Out of Food in the Last Year: Never true  Transportation Needs: No Transportation Needs (06/22/2023)   PRAPARE - Administrator, Civil Service (Medical): No    Lack of Transportation (Non-Medical): No  Physical Activity: Not on file  Stress: Not on file  Social Connections: Not on file    Allergies:  Allergies  Allergen Reactions   Mirtazapine Rash   Other Itching and Rash    ETCO2 tubing. Medtronic Microstream advance    Metabolic Disorder Labs: Lab Results  Component Value Date   HGBA1C 5.2 08/25/2021   MPG 102.54 08/25/2021   No results found for: "PROLACTIN" No results found for: "CHOL", "TRIG", "HDL", "CHOLHDL", "VLDL", "LDLCALC" Lab Results  Component Value Date   TSH 2.715 08/13/2022   TSH 3.886 08/25/2021    Therapeutic Level Labs: No results found for: "LITHIUM" No results found for: "VALPROATE" No results found for: "CBMZ"  Current Medications: Current Outpatient Medications  Medication Sig Dispense Refill   atenolol (TENORMIN) 50 MG tablet Take 50 mg by mouth daily.     buPROPion (WELLBUTRIN XL) 150 MG 24 hr tablet Take 1 tablet (150 mg total) by mouth daily. Total of 450 mg daily. Take along with 300 mg tab 30 tablet 5   buPROPion (WELLBUTRIN XL) 300 MG 24 hr tablet Take 1 tablet (300 mg total) by mouth daily. Total of 450 mg daily. Take along with 150 mg tab 30 tablet 5   busPIRone (BUSPAR) 7.5 MG tablet Take 1 tablet (7.5 mg total) by mouth 2 (two) times daily. 60 tablet 3   carbidopa-levodopa (SINEMET IR) 25-100 MG tablet Take 1 tablet by mouth 5 (five) times daily. 3 in morning 2 in evening     dexamethasone (DECADRON) 2 MG tablet Take 1 tablet by mouth daily x 1 week, then take 1/2 tablet daily x 1 week. Do not abruptly stop. 14 tablet 1   fentaNYL (DURAGESIC) 25 MCG/HR Place 1 patch onto the skin every 3 (three) days. 10 patch 0   ketoconazole  (NIZORAL) 2 % shampoo Apply 1 application  topically every other day.     lidocaine-prilocaine (EMLA) cream Apply to affected area once 30 g 3   magic mouthwash (multi-ingredient) oral suspension Swish and swallow 5-10 mLs by mouth 4 (four) times daily. 480 mL 3   magic mouthwash w/lidocaine SOLN Take 5-10 mLs by mouth 4 (four) times  daily as needed for mouth pain. Suspension contains equal amounts of Maalox Extra Strength, nystatin, diphenhydramine and lidocaine. 480 mL 3   metFORMIN (GLUCOPHAGE) 500 MG tablet Take 500 mg by mouth daily with breakfast.     ondansetron (ZOFRAN) 8 MG tablet Take 1 tablet (8 mg total) by mouth every 8 (eight) hours as needed for nausea or vomiting. Start on the third day after cisplatin. 30 tablet 1   Oxycodone HCl 10 MG TABS Take 1 tablet (10 mg total) by mouth every 4 (four) hours as needed. 90 tablet 0   prochlorperazine (COMPAZINE) 10 MG tablet Take 1 tablet (10 mg total) by mouth every 6 (six) hours as needed (Nausea or vomiting). 30 tablet 1   rosuvastatin (CRESTOR) 20 MG tablet Take 20 mg by mouth daily.     sildenafil (VIAGRA) 50 MG tablet Take 50 mg by mouth daily as needed for erectile dysfunction.     sucralfate (CARAFATE) 1 g tablet Take 1 tablet (1 g total) by mouth 3 (three) times daily before meals. Dissolve in 4 tbs of warm water, swish and swallow 90 tablet 1   tadalafil (CIALIS) 5 MG tablet Take 5 mg by mouth daily.     tamsulosin (FLOMAX) 0.4 MG CAPS capsule Take by mouth.     [START ON 09/09/2023] clonazePAM (KLONOPIN) 1 MG tablet Take 1 tablet (1 mg total) by mouth 3 (three) times daily as needed for anxiety. 90 tablet 0   dexamethasone (DECADRON) 4 MG tablet Take 2 tablets (8 mg) by mouth daily x 3 days starting the day after cisplatin chemotherapy. Take with food. 30 tablet 1   methylphenidate (RITALIN) 5 MG tablet Take 1 tablet (5 mg total) by mouth daily. (Patient not taking: Reported on 07/16/2023) 30 tablet 0   [START ON 09/04/2023] sertraline  (ZOLOFT) 100 MG tablet Take 1.5 tablets (150 mg total) by mouth at bedtime. 45 tablet 3   [START ON 08/14/2023] traZODone (DESYREL) 50 MG tablet Take 0.5-2 tablets (25-100 mg total) by mouth at bedtime as needed for sleep. 60 tablet 1   VICTOZA 18 MG/3ML SOPN Inject 1.2 mg into the skin daily. (Patient not taking: Reported on 06/22/2023)     No current facility-administered medications for this visit.     Musculoskeletal: Strength & Muscle Tone: within normal limits Gait & Station: normal Patient leans: N/A  Psychiatric Specialty Exam: Review of Systems  Psychiatric/Behavioral:  Positive for decreased concentration, dysphoric mood and sleep disturbance. Negative for agitation, behavioral problems, confusion, hallucinations, self-injury and suicidal ideas. The patient is nervous/anxious. The patient is not hyperactive.   All other systems reviewed and are negative.   Blood pressure 118/60, pulse 99, temperature 98.6 F (37 C), temperature source Temporal, height 5\' 9"  (1.753 m), weight 163 lb 9.6 oz (74.2 kg), SpO2 99%.Body mass index is 24.16 kg/m.  General Appearance: Well Groomed  Eye Contact:  Good  Speech:  Clear and Coherent  Volume:  Normal  Mood:   depressed  Affect:  Appropriate, Congruent, and down  Thought Process:  Coherent  Orientation:  Full (Time, Place, and Person)  Thought Content: Logical   Suicidal Thoughts:  No  Homicidal Thoughts:  No  Memory:  Immediate;   Good  Judgement:  Good  Insight:  Good  Psychomotor Activity:  Normal  Concentration:  Concentration: Good and Attention Span: Good  Recall:  Good  Fund of Knowledge: Good  Language: Good  Akathisia:  No  Handed:  Right  AIMS (if indicated):  not done  Assets:  Communication Skills Desire for Improvement  ADL's:  Intact  Cognition: WNL  Sleep:  Fair   Screenings: ECT-MADRS    Flowsheet Row ECT Treatment from 07/28/2021 in [facility]  MADRS Total Score 41       GAD-7    Flowsheet Row Office Visit from 02/25/2023 in [facility] Office Visit from 01/07/2023 in [facility] Office Visit from 10/12/2022 in [facility] Office Visit from 06/16/2022 in [facility] Office Visit from 03/31/2022 in [facility]  Total GAD-7 Score 18 18 12 20 21       Mini-Mental    Flowsheet Row ECT Treatment from 07/28/2021 in [facility]  Total Score (max 30 points ) 30      PHQ2-9    Flowsheet Row Office Visit from 06/22/2023 in [facility] [place] Office Visit from 02/25/2023 in [facility] Office Visit from 01/07/2023 in [facility] Office Visit from 10/12/2022 in [facility] Office Visit from 06/16/2022 in [facility]  PHQ-2 Total Score 6 5 6 4 6   PHQ-9 Total Score -- 17 22 11 18       Flowsheet Row Office Visit from 03/31/2022 in [facility] Office Visit from 02/09/2022 in [facility] Office Visit from 12/22/2021 in [facility]  C-SSRS RISK CATEGORY No Risk No Risk No Risk        Assessment and Plan:  Paulanthony Gleaves is a 68 y.o. 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 diabetes, hypertension, hyperlipidemia, carpal tunnel syndrome, s/p knee arthroscopy , who presents for follow up appointment for below.   1. MDD (major depressive disorder), recurrent episode, moderate (HCC) 2. 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 depression since 1979, ECT in 2023 with limited benefit, originally on duloxetine 60 mg daily, bupropion 150 mg daily Abilify 30 mg daily        He experiences depressive symptoms with significant exhaustion, and anxiety in the setting of undergoing treatment for cancer.  He is not a good candidate for stimulant due to history of adverse reaction of restlessness in the past.  The treatment option is also quite limited due to history of parkinsonian symptoms.  Given he has an upcoming appointment for therapy, will not make changes in his medication adjustment.  Will continue current dose of sertraline to target depression and anxiety.  Will continue bupropion adjunctive treatment for depression.  Will continue BuSpar for anxiety.  Will continue clonazepam as needed for anxiety.    Noted that he was previously referred and was accepted for TMS treatment.  Which is on hold due to the current medical condition. Also noted that his facial expression appear slightly more vital since discontinuing Abilify, although both he and his wife deny noticing any subjective change. Will plan to hold off on initiating any antipsychotic at this time.   # benzodiazepine dependence (  prescribed) - tapered down from 1 mg TID since April 2023, which was uptitrated back Feb 2025 Although he was adjusting well to tapering down medication 2 years ago, his anxiety has worsened in the setting of cancer diagnosis.  He reports good benefit since uptitration without any side effects of drowsiness, oversedation or fall.  Both the patient and his wife is aware of potential risk of respiratory suppression with concomitant use of opioid.   # Insomnia 3. Fatigue, unspecified type 4. Obstructive sleep apnea # vitamin D  deficiency - According to the recent sleep study, he has severe obstructive sleep apnea, AHI of 31.  - Ritalin 5 mg did not make any difference, but cause adverse reaction of restlessness.  Multifactorial due to current medical condition.  She reports good benefit from trazodone, and taking additional dose of clonazepam at night.  Will continue current dose of trazodone to target insomnia.     5. Cognitive deficits Functional Status   IADL: Independent in the following:driving (advised to refrain from driving 06/4399)           Requires assistance with the following:  managing finances, medications (using pill box, his wife checks) ADL  Independent in the following: bathing and hygiene, feeding, continence, grooming and toileting, walking          Requires assistance with the following: Folate, Vitamin B12, TSH- low folic acid 08/2022. Otherwise wnl Images Brain MRI 08/2021: There is no evidence of an acute infarct, intracranial hemorrhage, mass, midline shift, or extra-axial fluid collection.Small T2 hyperintensities in the cerebral white matter bilaterally are nonspecific but compatible with mild chronic small vessel ischemic disease. Dilated perivascular spaces are noted in the basal ganglia bilaterally. There is mild generalized cerebral atrophy. Neuropsych assessment:  Etiology: parkinson, r/o VaD   He denies any subjective symptoms of memory loss.  However, his IADL is somewhat limited. Will plan to do further evaluation in the future visit.    Plan  Continue sertraline 150 mg at night (limited benefit from higher dose) Continue bupropion 450 mg daily Continue Buspar 7.5 mg twice a day (restless leg from 10 mg BID) Continue trazodone 100 mg at night as needed for insomnia Continue clonazepam 1 mg three times a day Next appointment-  5/29 at 1 pm, IP, or contact for sooner appointment They are advised to contact the sleep clinic regarding the result - TSH- 4.238 10/2022   Past trials of  medication (the following medication caused either side effect, or limited benefit): lexapro, duloxetine, venlafaxine, mirtazapine (rash), Abilify, quetiapine (insomnia, RLS)   The patient demonstrates the following risk factors for suicide: Chronic risk factors for suicide include: psychiatric disorder of depression and chronic pain. Acute risk factors for suicide include: unemployment. Protective factors for this patient include: positive social support, coping skills and hope for the future. Considering these factors, the overall suicide risk at this point appears to be low. Patient is appropriate for outpatient follow up.    Collaboration of Care: Collaboration of Care: Other reviewed notes in Epic  Patient/Guardian was advised Release of Information must be obtained prior to any record release in order to collaborate their care with an outside provider. Patient/Guardian was advised if they have not already done so to contact the registration department to sign all necessary forms in order for [place] to release information regarding their care.   Consent: Patient/Guardian gives verbal consent for treatment and assignment of benefits for services provided during this visit. Patient/Guardian expressed understanding and agreed to  proceed.    Neysa Hotter, MD 08/10/2023, 1:40 PM

## 2023-08-06 NOTE — Telephone Encounter (Signed)
 [name] the wife wants to speak to the radiation staff about the pt. Her call back is [phone]

## 2023-08-06 NOTE — Progress Notes (Signed)
 Nutrition  Patient did not have radiation today and therefore did not come for nutrition appointment as scheduled.    Called but no answer.  Left message for [name] that RD would follow-up next week.  Call back number provided.  Hezekiah Veltre B. Elease Hashimoto, CSO, LDN Registered Dietitian [phone]

## 2023-08-09 ENCOUNTER — Encounter: Payer: Self-pay | Admitting: Oncology

## 2023-08-09 ENCOUNTER — Ambulatory Visit
Admission: RE | Admit: 2023-08-09 | Discharge: 2023-08-09 | Disposition: A | Discharge status: Home / Self Care | Payer: Medicare Other | Source: Ambulatory Visit | Attending: Radiation Oncology | Admitting: Radiation Oncology

## 2023-08-09 ENCOUNTER — Other Ambulatory Visit: Payer: Self-pay

## 2023-08-09 ENCOUNTER — Other Ambulatory Visit: Payer: Self-pay | Admitting: *Deleted

## 2023-08-09 ENCOUNTER — Inpatient Hospital Stay

## 2023-08-09 ENCOUNTER — Inpatient Hospital Stay (HOSPITAL_BASED_OUTPATIENT_CLINIC_OR_DEPARTMENT_OTHER): Admitting: Oncology

## 2023-08-09 ENCOUNTER — Inpatient Hospital Stay (HOSPITAL_BASED_OUTPATIENT_CLINIC_OR_DEPARTMENT_OTHER)

## 2023-08-09 VITALS — BP 111/67 | HR 78

## 2023-08-09 VITALS — BP 102/67 | HR 77 | Temp 97.7°F | Resp 18 | Ht 69.0 in | Wt 162.5 lb

## 2023-08-09 DIAGNOSIS — T451X5A Adverse effect of antineoplastic and immunosuppressive drugs, initial encounter: Secondary | ICD-10-CM

## 2023-08-09 DIAGNOSIS — D6481 Anemia due to antineoplastic chemotherapy: Secondary | ICD-10-CM

## 2023-08-09 DIAGNOSIS — D6959 Other secondary thrombocytopenia: Secondary | ICD-10-CM | POA: Diagnosis not present

## 2023-08-09 DIAGNOSIS — Z510 Encounter for antineoplastic radiation therapy: Secondary | ICD-10-CM | POA: Diagnosis not present

## 2023-08-09 DIAGNOSIS — G893 Neoplasm related pain (acute) (chronic): Secondary | ICD-10-CM

## 2023-08-09 DIAGNOSIS — C109 Malignant neoplasm of oropharynx, unspecified: Secondary | ICD-10-CM

## 2023-08-09 DIAGNOSIS — Z5111 Encounter for antineoplastic chemotherapy: Secondary | ICD-10-CM

## 2023-08-09 LAB — CBC WITH DIFFERENTIAL (CANCER CENTER ONLY)
Abs Immature Granulocytes: 0.02 10*3/uL (ref 0.00–0.07)
Basophils Absolute: 0 10*3/uL (ref 0.0–0.1)
Basophils Relative: 1 %
Eosinophils Absolute: 0 10*3/uL (ref 0.0–0.5)
Eosinophils Relative: 1 %
HCT: 29.3 % — ABNORMAL LOW (ref 39.0–52.0)
Hemoglobin: 10.2 g/dL — ABNORMAL LOW (ref 13.0–17.0)
Immature Granulocytes: 0 %
Lymphocytes Relative: 7 %
Lymphs Abs: 0.5 10*3/uL — ABNORMAL LOW (ref 0.7–4.0)
MCH: 30.4 pg (ref 26.0–34.0)
MCHC: 34.8 g/dL (ref 30.0–36.0)
MCV: 87.5 fL (ref 80.0–100.0)
Monocytes Absolute: 0.3 10*3/uL (ref 0.1–1.0)
Monocytes Relative: 5 %
Neutro Abs: 5.4 10*3/uL (ref 1.7–7.7)
Neutrophils Relative %: 86 %
Platelet Count: 93 10*3/uL — ABNORMAL LOW (ref 150–400)
RBC: 3.35 MIL/uL — ABNORMAL LOW (ref 4.22–5.81)
RDW: 12.8 % (ref 11.5–15.5)
WBC Count: 6.2 10*3/uL (ref 4.0–10.5)
nRBC: 0 % (ref 0.0–0.2)

## 2023-08-09 LAB — RAD ONC ARIA SESSION SUMMARY
Course Elapsed Days: 28
Plan Fractions Treated to Date: 16
Plan Prescribed Dose Per Fraction: 2 Gy
Plan Total Fractions Prescribed: 35
Plan Total Prescribed Dose: 70 Gy
Reference Point Dosage Given to Date: 32 Gy
Reference Point Session Dosage Given: 2 Gy
Session Number: 16

## 2023-08-09 LAB — BASIC METABOLIC PANEL - CANCER CENTER ONLY
Anion gap: 10 (ref 5–15)
BUN: 17 mg/dL (ref 8–23)
CO2: 24 mmol/L (ref 22–32)
Calcium: 8.8 mg/dL — ABNORMAL LOW (ref 8.9–10.3)
Chloride: 102 mmol/L (ref 98–111)
Creatinine: 1.13 mg/dL (ref 0.61–1.24)
GFR, Estimated: >60 mL/min (ref >60)
Glucose, Bld: 148 mg/dL — ABNORMAL HIGH (ref 70–99)
Potassium: 3.9 mmol/L (ref 3.5–5.1)
Sodium: 136 mmol/L (ref 135–145)

## 2023-08-09 LAB — MAGNESIUM: Magnesium: 1.4 mg/dL — ABNORMAL LOW (ref 1.7–2.4)

## 2023-08-09 MED ORDER — PALONOSETRON HCL INJECTION 0.25 MG/5ML
0.2500 mg | Freq: Once | INTRAVENOUS | Status: AC
  Administered 2023-08-09: 0.25 mg via INTRAVENOUS
  Filled 2023-08-09: qty 5

## 2023-08-09 MED ORDER — MAGNESIUM SULFATE 2 GM/50ML IV SOLN
2.0000 g | Freq: Once | INTRAVENOUS | Status: AC
Start: 2023-08-09 — End: 2023-08-09
  Administered 2023-08-09: 2 g via INTRAVENOUS
  Filled 2023-08-09: qty 50

## 2023-08-09 MED ORDER — SODIUM CHLORIDE 0.9 % IV SOLN
150.0000 mg | Freq: Once | INTRAVENOUS | Status: AC
  Administered 2023-08-09: 150 mg via INTRAVENOUS
  Filled 2023-08-09: qty 150

## 2023-08-09 MED ORDER — SODIUM CHLORIDE 0.9 % IV SOLN
INTRAVENOUS | Status: DC
  Filled 2023-08-09: qty 250

## 2023-08-09 MED ORDER — SODIUM CHLORIDE 0.9 % IV SOLN
40.0000 mg/m2 | Freq: Once | INTRAVENOUS | Status: AC
  Administered 2023-08-09: 79 mg via INTRAVENOUS
  Filled 2023-08-09: qty 79

## 2023-08-09 MED ORDER — POTASSIUM CHLORIDE IN NACL 20-0.9 MEQ/L-% IV SOLN
Freq: Once | INTRAVENOUS | Status: AC
  Filled 2023-08-09: qty 1000

## 2023-08-09 MED ORDER — DEXAMETHASONE SODIUM PHOSPHATE 10 MG/ML IJ SOLN
10.0000 mg | Freq: Once | INTRAMUSCULAR | Status: AC
  Administered 2023-08-09: 10 mg via INTRAVENOUS
  Filled 2023-08-09: qty 1

## 2023-08-09 MED ORDER — HEPARIN SOD (PORK) LOCK FLUSH 100 UNIT/ML IV SOLN
500.0000 [IU] | Freq: Once | INTRAVENOUS | Status: AC | PRN
  Administered 2023-08-09: 500 [IU]
  Filled 2023-08-09: qty 5

## 2023-08-09 NOTE — Progress Notes (Signed)
 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  He was ultimately referred By his dentist to ENT and underwent CT soft tissue neck which showed a 4 cm partially necrotic mass at the glossotonsillar sulcus on the right side and to level 2 lymph nodes measuring 15 and 14 mm that appeared pathologic.  Core needle biopsy of the level 2 lymph node was consistent with squamous cell carcinoma.  Cells diffusely positive for p40 and p16.  Plan is to proceed with concurrent weekly cisplatin chemotherapy with radiation     Interval history-pain is currently well-controlled with fentanyl patch and as needed oxycodone which she uses about 4 to 5/day.  Bowel habits are regular with MiraLAX.  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  Magic mouthwash was working out very expensive for him.  ECOG PS- 1 Pain scale- 3 Opioid associated constipation- no  Review of systems- Review of Systems  Constitutional:  Negative for chills, fever, malaise/fatigue and weight loss.  HENT:  Negative for congestion, ear discharge and nosebleeds.   Eyes:  Negative for blurred vision.  Respiratory:  Negative for cough, hemoptysis, sputum production, shortness of breath and wheezing.   Cardiovascular:  Negative for chest pain, palpitations, orthopnea and claudication.   Gastrointestinal:  Negative for abdominal pain, blood in stool, constipation, diarrhea, heartburn, melena, nausea and vomiting.  Genitourinary:  Negative for dysuria, flank pain, frequency, hematuria and urgency.  Musculoskeletal:  Negative for back pain, joint pain and myalgias.  Skin:  Negative for rash.  Neurological:  Negative for dizziness, tingling, focal weakness, seizures, weakness and headaches.  Endo/Heme/Allergies:  Does not bruise/bleed easily.  Psychiatric/Behavioral:  Negative for depression and suicidal ideas. The patient does not have insomnia.       Allergies  Allergen Reactions   Mirtazapine Rash   Other Itching and Rash    ETCO2 tubing. Medtronic Microstream advance     Past Medical History:  Diagnosis Date   Anxiety    Arthritis    Carpal tunnel syndrome    Depression    Diabetes (HCC)    Hyperlipidemia    Hypertension    Loss of teeth due to extraction    Neoplasm related pain    Parkinson's disease [facility])      Past Surgical History:  Procedure Laterality Date   CARPAL TUNNEL RELEASE Right 11/28/2020   Procedure: CARPAL TUNNEL RELEASE ENDOSCOPIC;  Surgeon: Christena Flake, MD;  Location: [facility] ORS;  Service: Orthopedics;  Laterality: Right;   CARPAL TUNNEL RELEASE Left 01/22/2021   Procedure: CARPAL TUNNEL RELEASE ENDOSCOPIC;  Surgeon: Christena Flake, MD;  Location: [facility] ORS;  Service: Orthopedics;  Laterality: Left;   IR IMAGING GUIDED PORT INSERTION  06/30/2023   KNEE ARTHROSCOPY Right     Social History   Socioeconomic History   Marital status: Single    Spouse name: [name]   Number  of children: Not on file   Years of education: Not on file   Highest education level: Not on file  Occupational History   Not on file  Tobacco Use   Smoking status: Never   Smokeless tobacco: Never  Vaping Use   Vaping status: Never Used  Substance and Sexual Activity   Alcohol use: Not Currently   Drug use: Never   Sexual activity: Not Currently    Birth  control/protection: None  Other Topics Concern   Not on file  Social History Narrative   Lives with friend   Social Drivers of Health   Financial Resource Strain: Not on file  Food Insecurity: No Food Insecurity (06/22/2023)   Hunger Vital Sign    Worried About Running Out of Food in the Last Year: Never true    Ran Out of Food in the Last Year: Never true  Transportation Needs: No Transportation Needs (06/22/2023)   PRAPARE - Administrator, Civil Service (Medical): No    Lack of Transportation (Non-Medical): No  Physical Activity: Not on file  Stress: Not on file  Social Connections: Not on file  Intimate Partner Violence: Not At Risk (06/22/2023)   Humiliation, Afraid, Rape, and Kick questionnaire    Fear of Current or Ex-Partner: No    Emotionally Abused: No    Physically Abused: No    Sexually Abused: No    Family History  Problem Relation Age of Onset   Diabetes Mother    Hypertension Mother    CVA Father    Heart attack Father    Depression Sister    Depression Brother      Current Outpatient Medications:    ARIPiprazole (ABILIFY) 30 MG tablet, Take 30 mg by mouth daily., Disp: , Rfl:    atenolol (TENORMIN) 50 MG tablet, Take 50 mg by mouth daily. (Patient not taking: Reported on 07/12/2023), Disp: , Rfl:    buPROPion (WELLBUTRIN XL) 150 MG 24 hr tablet, Take 1 tablet (150 mg total) by mouth daily. Total of 450 mg daily. Take along with 300 mg tab, Disp: 30 tablet, Rfl: 5   buPROPion (WELLBUTRIN XL) 300 MG 24 hr tablet, Take 1 tablet (300 mg total) by mouth daily. Total of 450 mg daily. Take along with 150 mg tab, Disp: 30 tablet, Rfl: 5   busPIRone (BUSPAR) 7.5 MG tablet, Take 1 tablet (7.5 mg total) by mouth 2 (two) times daily., Disp: 60 tablet, Rfl: 3   carbidopa-levodopa (SINEMET IR) 25-100 MG tablet, Take 1 tablet by mouth 5 (five) times daily. 3 in morning 2 in evening, Disp: , Rfl:    clonazePAM (KLONOPIN) 1 MG tablet, May take 1 tablet (1 mg total)  by mouth 2 (two) times daily as needed for anxiety. May also take 0.5-1 tablets (0.5-1 mg total) at bedtime as needed for anxiety., Disp: 90 tablet, Rfl: 1   dexamethasone (DECADRON) 2 MG tablet, Take 1 tablet by mouth daily x 1 week, then take 1/2 tablet daily x 1 week. Do not abruptly stop., Disp: 14 tablet, Rfl: 1   dexamethasone (DECADRON) 4 MG tablet, Take 2 tablets (8 mg) by mouth daily x 3 days starting the day after cisplatin chemotherapy. Take with food., Disp: 30 tablet, Rfl: 1   fentaNYL (DURAGESIC) 25 MCG/HR, Place 1 patch onto the skin every 3 (three) days., Disp: 10 patch, Rfl: 0   ketoconazole (NIZORAL) 2 % shampoo, Apply 1 application topically every other day. (Patient not taking: Reported on  06/22/2023), Disp: , Rfl:    lidocaine-prilocaine (EMLA) cream, Apply to affected area once, Disp: 30 g, Rfl: 3   magic mouthwash (multi-ingredient) oral suspension, Swish and swallow 5-10 mLs by mouth 4 (four) times daily., Disp: 480 mL, Rfl: 3   magic mouthwash w/lidocaine SOLN, Take 5-10 mLs by mouth 4 (four) times daily as needed for mouth pain. Suspension contains equal amounts of Maalox Extra Strength, nystatin, diphenhydramine and lidocaine., Disp: 480 mL, Rfl: 3   metFORMIN (GLUCOPHAGE) 500 MG tablet, Take 500 mg by mouth daily with breakfast., Disp: , Rfl:    methylphenidate (RITALIN) 5 MG tablet, Take 1 tablet (5 mg total) by mouth daily. (Patient not taking: Reported on 07/16/2023), Disp: 30 tablet, Rfl: 0   ondansetron (ZOFRAN) 8 MG tablet, Take 1 tablet (8 mg total) by mouth every 8 (eight) hours as needed for nausea or vomiting. Start on the third day after cisplatin., Disp: 30 tablet, Rfl: 1   Oxycodone HCl 10 MG TABS, Take 1 tablet (10 mg total) by mouth every 4 (four) hours as needed., Disp: 90 tablet, Rfl: 0   prochlorperazine (COMPAZINE) 10 MG tablet, Take 1 tablet (10 mg total) by mouth every 6 (six) hours as needed (Nausea or vomiting)., Disp: 30 tablet, Rfl: 1   rosuvastatin  (CRESTOR) 20 MG tablet, Take 20 mg by mouth daily., Disp: , Rfl:    sertraline (ZOLOFT) 100 MG tablet, Take 1.5 tablets (150 mg total) by mouth at bedtime., Disp: 45 tablet, Rfl: 3   sildenafil (VIAGRA) 50 MG tablet, Take 50 mg by mouth daily as needed for erectile dysfunction., Disp: , Rfl:    sucralfate (CARAFATE) 1 g tablet, Take 1 tablet (1 g total) by mouth 3 (three) times daily before meals. Dissolve in 4 tbs of warm water, swish and swallow, Disp: 90 tablet, Rfl: 1   tadalafil (CIALIS) 5 MG tablet, Take 5 mg by mouth daily., Disp: , Rfl:    tamsulosin (FLOMAX) 0.4 MG CAPS capsule, Take by mouth., Disp: , Rfl:    traZODone (DESYREL) 50 MG tablet, Take 0.5-2 tablets (25-100 mg total) by mouth at bedtime as needed for sleep., Disp: 60 tablet, Rfl: 1   VICTOZA 18 MG/3ML SOPN, Inject 1.2 mg into the skin daily. (Patient not taking: Reported on 06/22/2023), Disp: , Rfl:   Physical exam:  Vitals:   08/09/23 0944  BP: 102/67  Pulse: 77  Resp: 18  Temp: 97.7 F (36.5 C)  TempSrc: Tympanic  SpO2: 98%  Weight: 162 lb 7.7 oz (73.7 kg)  Height: 5\' 9"  (1.753 m)   Physical Exam HENT:     Mouth/Throat:     Mouth: Mucous membranes are moist.     Pharynx: Oropharynx is clear.  Cardiovascular:     Rate and Rhythm: Normal rate and regular rhythm.     Heart sounds: Normal heart sounds.  Pulmonary:     Effort: Pulmonary effort is normal.     Breath sounds: Normal breath sounds.  Abdominal:     General: Bowel sounds are normal.     Palpations: Abdomen is soft.  Lymphadenopathy:     Comments: Right cervical adenopathy decreasing in size  Skin:    General: Skin is warm and dry.  Neurological:     Mental Status: He is alert and oriented to person, place, and time.      I have personally reviewed labs listed below:    Latest Ref Rng & Units 08/09/2023    9:41 AM  CMP  Glucose 70 - 99 mg/dL 161   BUN 8 - 23 mg/dL 17   Creatinine 0.96 - 1.24 mg/dL 0.45   Sodium 409 - 811 mmol/L 136    Potassium 3.5 - 5.1 mmol/L 3.9   Chloride 98 - 111 mmol/L 102   CO2 22 - 32 mmol/L 24   Calcium 8.9 - 10.3 mg/dL 8.8       Latest Ref Rng & Units 08/09/2023    9:41 AM  CBC  WBC 4.0 - 10.5 K/uL 6.2   Hemoglobin 13.0 - 17.0 g/dL 91.4   Hematocrit 78.2 - 52.0 % 29.3   Platelets 150 - 400 K/uL 93    I  Assessment and plan- Patient is a 68 y.o. 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  He is here for on treatment assessment prior to cycle 4 of weekly cisplatin chemotherapy  Counts okay to proceed with cycle 4 of weekly cisplatin chemotherapy today.  He will directly proceed for cycle 5 next week and I will see him back in 2 weeks for cycle 6.  Plan is to complete 7 weekly cycles of treatment with radiation.  Neoplasm relatedPain: He is currently on fentanyl patch 25 mcg and oxycodone 10 mg every 4 hours as needed  Weight loss: Secondary to ongoing chemoradiation.  He has lost 7 pounds in the last 3 weeks.  Continue to monitor.  We are holding off on feeding tube placement at this time  Chemo-induced thrombocytopenia: Okay to do cisplatin as long as platelets are more than 75.  Continue to monitor  Chemo-induced anemia: Will check ferritin and iron studies B12 and folate with next set of labs   Visit Diagnosis 1. Encounter for antineoplastic chemotherapy   2. Squamous cell carcinoma of oropharynx (HCC)   3. Neoplasm related pain   4. Chemotherapy-induced thrombocytopenia   5. Antineoplastic chemotherapy induced anemia      Dr. Owens Shark, MD, MPH [facility] at [facility] [phone] 08/09/2023 9:45 AM

## 2023-08-09 NOTE — Patient Instructions (Signed)

## 2023-08-10 ENCOUNTER — Encounter: Payer: Self-pay | Admitting: Psychiatry

## 2023-08-10 ENCOUNTER — Other Ambulatory Visit: Payer: Self-pay

## 2023-08-10 ENCOUNTER — Ambulatory Visit
Admission: RE | Admit: 2023-08-10 | Discharge: 2023-08-10 | Disposition: A | Discharge status: Home / Self Care | Payer: Medicare Other | Source: Ambulatory Visit | Attending: Radiation Oncology | Admitting: Radiation Oncology

## 2023-08-10 ENCOUNTER — Ambulatory Visit (INDEPENDENT_AMBULATORY_CARE_PROVIDER_SITE_OTHER): Payer: Self-pay | Admitting: Psychiatry

## 2023-08-10 VITALS — BP 118/60 | HR 99 | Temp 98.6°F | Ht 69.0 in | Wt 163.6 lb

## 2023-08-10 DIAGNOSIS — Z510 Encounter for antineoplastic radiation therapy: Secondary | ICD-10-CM | POA: Diagnosis not present

## 2023-08-10 DIAGNOSIS — F331 Major depressive disorder, recurrent, moderate: Secondary | ICD-10-CM

## 2023-08-10 DIAGNOSIS — F411 Generalized anxiety disorder: Secondary | ICD-10-CM

## 2023-08-10 DIAGNOSIS — G4700 Insomnia, unspecified: Secondary | ICD-10-CM

## 2023-08-10 DIAGNOSIS — C091 Malignant neoplasm of tonsillar pillar (anterior) (posterior): Secondary | ICD-10-CM | POA: Insufficient documentation

## 2023-08-10 DIAGNOSIS — C109 Malignant neoplasm of oropharynx, unspecified: Secondary | ICD-10-CM | POA: Diagnosis present

## 2023-08-10 LAB — RAD ONC ARIA SESSION SUMMARY
Course Elapsed Days: 29
Plan Fractions Treated to Date: 17
Plan Prescribed Dose Per Fraction: 2 Gy
Plan Total Fractions Prescribed: 35
Plan Total Prescribed Dose: 70 Gy
Reference Point Dosage Given to Date: 34 Gy
Reference Point Session Dosage Given: 2 Gy
Session Number: 17

## 2023-08-10 MED ORDER — CLONAZEPAM 1 MG PO TABS: 1.0000 mg | ORAL_TABLET | Freq: Three times a day (TID) | ORAL | 0 refills | Status: DC | PRN

## 2023-08-10 MED ORDER — SERTRALINE HCL 100 MG PO TABS: 150.0000 mg | ORAL_TABLET | Freq: Every day | ORAL | 3 refills | Status: DC

## 2023-08-10 MED ORDER — TRAZODONE HCL 50 MG PO TABS: 25.0000 mg | ORAL_TABLET | Freq: Every evening | ORAL | 1 refills | Status: DC | PRN

## 2023-08-10 NOTE — Patient Instructions (Signed)
 Continue sertraline 150 mg at night  Continue bupropion 450 mg daily Continue Buspar 7.5 mg twice a day Continue trazodone 100 mg at night as needed for insomnia Continue clonazepam 1 mg three times a day Next appointment-  5/29 at 1 pm

## 2023-08-11 ENCOUNTER — Ambulatory Visit
Admission: RE | Admit: 2023-08-11 | Discharge: 2023-08-11 | Disposition: A | Discharge status: Home / Self Care | Payer: Medicare Other | Source: Ambulatory Visit | Attending: Radiation Oncology | Admitting: Radiation Oncology

## 2023-08-11 ENCOUNTER — Other Ambulatory Visit: Payer: Self-pay

## 2023-08-11 DIAGNOSIS — Z510 Encounter for antineoplastic radiation therapy: Secondary | ICD-10-CM | POA: Diagnosis not present

## 2023-08-11 LAB — RAD ONC ARIA SESSION SUMMARY
Course Elapsed Days: 30
Plan Fractions Treated to Date: 18
Plan Prescribed Dose Per Fraction: 2 Gy
Plan Total Fractions Prescribed: 35
Plan Total Prescribed Dose: 70 Gy
Reference Point Dosage Given to Date: 36 Gy
Reference Point Session Dosage Given: 2 Gy
Session Number: 18

## 2023-08-12 ENCOUNTER — Ambulatory Visit
Admission: RE | Admit: 2023-08-12 | Discharge: 2023-08-12 | Disposition: A | Discharge status: Home / Self Care | Payer: Medicare Other | Source: Ambulatory Visit | Attending: Radiation Oncology | Admitting: Radiation Oncology

## 2023-08-12 ENCOUNTER — Other Ambulatory Visit: Payer: Self-pay

## 2023-08-12 DIAGNOSIS — Z510 Encounter for antineoplastic radiation therapy: Secondary | ICD-10-CM | POA: Diagnosis not present

## 2023-08-12 LAB — RAD ONC ARIA SESSION SUMMARY
Course Elapsed Days: 31
Plan Fractions Treated to Date: 19
Plan Prescribed Dose Per Fraction: 2 Gy
Plan Total Fractions Prescribed: 35
Plan Total Prescribed Dose: 70 Gy
Reference Point Dosage Given to Date: 38 Gy
Reference Point Session Dosage Given: 2 Gy
Session Number: 19

## 2023-08-13 ENCOUNTER — Inpatient Hospital Stay: Attending: Oncology

## 2023-08-13 ENCOUNTER — Ambulatory Visit
Admission: RE | Admit: 2023-08-13 | Discharge: 2023-08-13 | Disposition: A | Discharge status: Home / Self Care | Payer: Medicare Other | Source: Ambulatory Visit | Attending: Radiation Oncology | Admitting: Radiation Oncology

## 2023-08-13 ENCOUNTER — Other Ambulatory Visit: Payer: Self-pay

## 2023-08-13 DIAGNOSIS — Z79630 Long term (current) use of alkylating agent: Secondary | ICD-10-CM | POA: Insufficient documentation

## 2023-08-13 DIAGNOSIS — D6481 Anemia due to antineoplastic chemotherapy: Secondary | ICD-10-CM | POA: Insufficient documentation

## 2023-08-13 DIAGNOSIS — Z5111 Encounter for antineoplastic chemotherapy: Secondary | ICD-10-CM | POA: Insufficient documentation

## 2023-08-13 DIAGNOSIS — C109 Malignant neoplasm of oropharynx, unspecified: Secondary | ICD-10-CM | POA: Insufficient documentation

## 2023-08-13 DIAGNOSIS — Z510 Encounter for antineoplastic radiation therapy: Secondary | ICD-10-CM | POA: Diagnosis not present

## 2023-08-13 DIAGNOSIS — T451X5A Adverse effect of antineoplastic and immunosuppressive drugs, initial encounter: Secondary | ICD-10-CM | POA: Insufficient documentation

## 2023-08-13 LAB — RAD ONC ARIA SESSION SUMMARY
Course Elapsed Days: 32
Plan Fractions Treated to Date: 20
Plan Prescribed Dose Per Fraction: 2 Gy
Plan Total Fractions Prescribed: 35
Plan Total Prescribed Dose: 70 Gy
Reference Point Dosage Given to Date: 40 Gy
Reference Point Session Dosage Given: 2 Gy
Session Number: 20

## 2023-08-13 MED FILL — Fosaprepitant Dimeglumine For IV Infusion 150 MG (Base Eq): INTRAVENOUS | Qty: 5 | Status: AC

## 2023-08-13 NOTE — Progress Notes (Signed)
 Nutrition Follow-up:  Patient with right glossotonsillar sulcus mass, p 16+.  Patient receiving concurrent radiation and cisplatin.  Patient did not show up for PEG placement consultation.   Met with patient and significant other Ricky [name] after radiation.  Has add custard in the am before coming to treatment and at night before bed.  Was able to eat large portion of stew beef and mashed potatoes as well as tuna salad.  Reports that he just can't taste foods very well.  Still getting Frosty and milkshakes.  Using carafate and feels that is helping him.  Taking 1/2 dose of miralax when needed.      Medications: reviewed  Labs: reviewed  Anthropometrics:   Weight 162 lb 7.7 oz 165 lb 4.8 oz on 3/17 169 lb 3/7 177 lb on 2/13 184 lb on 02/25/23   NUTRITION DIAGNOSIS: Inadequate oral intake continues with treatment side effects    INTERVENTION:  Previously declined feeding tube Continue custard BID for added calories and protein Discussed lack of taste as normal side effect from treatment Encouraged small frequent high calorie foods q 2 hours Continue bowel regimen to prevent constipation    MONITORING, EVALUATION, GOAL: weight trends, intake   NEXT VISIT: Friday, April 18 after radiation  Ricky [name] B. Ricky [name], CSO, LDN Registered Dietitian [phone]

## 2023-08-16 ENCOUNTER — Other Ambulatory Visit: Payer: Self-pay

## 2023-08-16 ENCOUNTER — Inpatient Hospital Stay

## 2023-08-16 ENCOUNTER — Ambulatory Visit
Admission: RE | Admit: 2023-08-16 | Discharge: 2023-08-16 | Disposition: A | Discharge status: Home / Self Care | Payer: Medicare Other | Source: Ambulatory Visit | Attending: Radiation Oncology | Admitting: Radiation Oncology

## 2023-08-16 VITALS — BP 101/65 | HR 72 | Temp 96.3°F | Resp 18 | Ht 69.0 in | Wt 163.8 lb

## 2023-08-16 DIAGNOSIS — C109 Malignant neoplasm of oropharynx, unspecified: Secondary | ICD-10-CM

## 2023-08-16 DIAGNOSIS — T451X5A Adverse effect of antineoplastic and immunosuppressive drugs, initial encounter: Secondary | ICD-10-CM | POA: Diagnosis not present

## 2023-08-16 DIAGNOSIS — Z79630 Long term (current) use of alkylating agent: Secondary | ICD-10-CM | POA: Diagnosis not present

## 2023-08-16 DIAGNOSIS — Z510 Encounter for antineoplastic radiation therapy: Secondary | ICD-10-CM | POA: Diagnosis not present

## 2023-08-16 DIAGNOSIS — D6481 Anemia due to antineoplastic chemotherapy: Secondary | ICD-10-CM | POA: Diagnosis not present

## 2023-08-16 DIAGNOSIS — Z5111 Encounter for antineoplastic chemotherapy: Secondary | ICD-10-CM | POA: Diagnosis present

## 2023-08-16 LAB — CBC WITH DIFFERENTIAL (CANCER CENTER ONLY)
Abs Immature Granulocytes: 0.01 10*3/uL (ref 0.00–0.07)
Basophils Absolute: 0 10*3/uL (ref 0.0–0.1)
Basophils Relative: 1 %
Eosinophils Absolute: 0 10*3/uL (ref 0.0–0.5)
Eosinophils Relative: 1 %
HCT: 24.3 % — ABNORMAL LOW (ref 39.0–52.0)
Hemoglobin: 8.6 g/dL — ABNORMAL LOW (ref 13.0–17.0)
Immature Granulocytes: 0 %
Lymphocytes Relative: 11 %
Lymphs Abs: 0.4 10*3/uL — ABNORMAL LOW (ref 0.7–4.0)
MCH: 30.7 pg (ref 26.0–34.0)
MCHC: 35.4 g/dL (ref 30.0–36.0)
MCV: 86.8 fL (ref 80.0–100.0)
Monocytes Absolute: 0.2 10*3/uL (ref 0.1–1.0)
Monocytes Relative: 6 %
Neutro Abs: 2.9 10*3/uL (ref 1.7–7.7)
Neutrophils Relative %: 81 %
Platelet Count: 77 10*3/uL — ABNORMAL LOW (ref 150–400)
RBC: 2.8 MIL/uL — ABNORMAL LOW (ref 4.22–5.81)
RDW: 13.1 % (ref 11.5–15.5)
WBC Count: 3.5 10*3/uL — ABNORMAL LOW (ref 4.0–10.5)
nRBC: 0 % (ref 0.0–0.2)

## 2023-08-16 LAB — HEPATIC FUNCTION PANEL
ALT: <5 U/L (ref 0–44)
AST: 14 U/L — ABNORMAL LOW (ref 15–41)
Albumin: 3.8 g/dL (ref 3.5–5.0)
Alkaline Phosphatase: 37 U/L — ABNORMAL LOW (ref 38–126)
Bilirubin, Direct: 0.2 mg/dL (ref 0.0–0.2)
Indirect Bilirubin: 0.6 mg/dL (ref 0.3–0.9)
Total Bilirubin: 0.8 mg/dL (ref 0.0–1.2)
Total Protein: 6.4 g/dL — ABNORMAL LOW (ref 6.5–8.1)

## 2023-08-16 LAB — RAD ONC ARIA SESSION SUMMARY
Course Elapsed Days: 35
Plan Fractions Treated to Date: 21
Plan Prescribed Dose Per Fraction: 2 Gy
Plan Total Fractions Prescribed: 35
Plan Total Prescribed Dose: 70 Gy
Reference Point Dosage Given to Date: 42 Gy
Reference Point Session Dosage Given: 2 Gy
Session Number: 21

## 2023-08-16 LAB — IRON AND TIBC
Iron: 108 ug/dL (ref 45–182)
Saturation Ratios: 37 % (ref 17.9–39.5)
TIBC: 295 ug/dL (ref 250–450)
UIBC: 187 ug/dL

## 2023-08-16 LAB — FOLATE: Folate: 2.7 ng/mL — ABNORMAL LOW (ref >5.9)

## 2023-08-16 LAB — BASIC METABOLIC PANEL - CANCER CENTER ONLY
Anion gap: 6 (ref 5–15)
BUN: 15 mg/dL (ref 8–23)
CO2: 25 mmol/L (ref 22–32)
Calcium: 7.6 mg/dL — ABNORMAL LOW (ref 8.9–10.3)
Chloride: 101 mmol/L (ref 98–111)
Creatinine: 1.14 mg/dL (ref 0.61–1.24)
GFR, Estimated: >60 mL/min (ref >60)
Glucose, Bld: 117 mg/dL — ABNORMAL HIGH (ref 70–99)
Potassium: 3.5 mmol/L (ref 3.5–5.1)
Sodium: 132 mmol/L — ABNORMAL LOW (ref 135–145)

## 2023-08-16 LAB — MAGNESIUM: Magnesium: 1.1 mg/dL — ABNORMAL LOW (ref 1.7–2.4)

## 2023-08-16 LAB — FERRITIN: Ferritin: 382 ng/mL — ABNORMAL HIGH (ref 24–336)

## 2023-08-16 LAB — VITAMIN B12: Vitamin B-12: 299 pg/mL (ref 180–914)

## 2023-08-16 MED ORDER — FOSAPREPITANT DIMEGLUMINE INJECTION 150 MG
150.0000 mg | Freq: Once | INTRAVENOUS | Status: AC
  Administered 2023-08-16: 150 mg via INTRAVENOUS
  Filled 2023-08-16: qty 150

## 2023-08-16 MED ORDER — MAGNESIUM SULFATE 2 GM/50ML IV SOLN
2.0000 g | Freq: Once | INTRAVENOUS | Status: AC
Start: 2023-08-16 — End: 2023-08-16
  Administered 2023-08-16: 2 g via INTRAVENOUS

## 2023-08-16 MED ORDER — POTASSIUM CHLORIDE IN NACL 20-0.9 MEQ/L-% IV SOLN: Freq: Once | INTRAVENOUS | Status: AC

## 2023-08-16 MED ORDER — CISPLATIN CHEMO INJECTION 100MG/100ML
40.0000 mg/m2 | Freq: Once | INTRAVENOUS | Status: AC
  Administered 2023-08-16: 79 mg via INTRAVENOUS
  Filled 2023-08-16: qty 79

## 2023-08-16 MED ORDER — FOLIC ACID 1 MG PO TABS: 1.0000 mg | ORAL_TABLET | Freq: Every day | ORAL | 3 refills | Status: DC

## 2023-08-16 MED ORDER — DEXAMETHASONE SODIUM PHOSPHATE 10 MG/ML IJ SOLN
10.0000 mg | Freq: Once | INTRAMUSCULAR | Status: AC
  Administered 2023-08-16: 10 mg via INTRAVENOUS
  Filled 2023-08-16: qty 1

## 2023-08-16 MED ORDER — MAGNESIUM SULFATE 2 GM/50ML IV SOLN
2.0000 g | Freq: Once | INTRAVENOUS | Status: AC
  Administered 2023-08-16: 2 g via INTRAVENOUS
  Filled 2023-08-16: qty 50

## 2023-08-16 MED ORDER — SODIUM CHLORIDE 0.9 % IV SOLN
INTRAVENOUS | Status: DC
Start: 2023-08-16 — End: 2023-08-16
  Filled 2023-08-16: qty 250

## 2023-08-16 MED ORDER — PALONOSETRON HCL INJECTION 0.25 MG/5ML
0.2500 mg | Freq: Once | INTRAVENOUS | Status: AC
  Administered 2023-08-16: 0.25 mg via INTRAVENOUS
  Filled 2023-08-16: qty 5

## 2023-08-16 MED ORDER — HEPARIN SOD (PORK) LOCK FLUSH 100 UNIT/ML IV SOLN
500.0000 [IU] | Freq: Once | INTRAVENOUS | Status: AC | PRN
  Administered 2023-08-16: 500 [IU]
  Filled 2023-08-16: qty 5

## 2023-08-16 NOTE — Progress Notes (Signed)
 Pt unable to void- ok to proceed with treatment per Dr. [name].

## 2023-08-16 NOTE — Patient Instructions (Signed)
 [facility] - A DEPT OF [facility]  Discharge Instructions: Thank you for choosing [facility] to provide your oncology and hematology care.  If you have a lab appointment with the Cancer Center, please go directly to the Cancer Center and check in at the registration area.  Wear comfortable clothing and clothing appropriate for easy access to any Portacath or PICC line.   We strive to give you quality time with your provider. You may need to reschedule your appointment if you arrive late (15 or more minutes).  Arriving late affects you and other patients whose appointments are after yours.  Also, if you miss three or more appointments without notifying the office, you may be dismissed from the clinic at the provider's discretion.      For prescription refill requests, have your pharmacy contact our office and allow 72 hours for refills to be completed.    Today you received the following chemotherapy and/or immunotherapy agents CISPLATIN      To help prevent nausea and vomiting after your treatment, we encourage you to take your nausea medication as directed.  BELOW ARE SYMPTOMS THAT SHOULD BE REPORTED IMMEDIATELY: *FEVER GREATER THAN 100.4 F (38 C) OR HIGHER *CHILLS OR SWEATING *NAUSEA AND VOMITING THAT IS NOT CONTROLLED WITH YOUR NAUSEA MEDICATION *UNUSUAL SHORTNESS OF BREATH *UNUSUAL BRUISING OR BLEEDING *URINARY PROBLEMS (pain or burning when urinating, or frequent urination) *BOWEL PROBLEMS (unusual diarrhea, constipation, pain near the anus) TENDERNESS IN MOUTH AND THROAT WITH OR WITHOUT PRESENCE OF ULCERS (sore throat, sores in mouth, or a toothache) UNUSUAL RASH, SWELLING OR PAIN  UNUSUAL VAGINAL DISCHARGE OR ITCHING   Items with * indicate a potential emergency and should be followed up as soon as possible or go to the Emergency Department if any problems should occur.  Please show the CHEMOTHERAPY ALERT CARD or IMMUNOTHERAPY  ALERT CARD at check-in to the Emergency Department and triage nurse.  Should you have questions after your visit or need to cancel or reschedule your appointment, please contact [facility] - A DEPT OF [facility]  [phone] and follow the prompts.  Office hours are 8:00 a.m. to 4:30 p.m. Monday - Friday. Please note that voicemails left after 4:00 p.m. may not be returned until the following business day.  We are closed weekends and major holidays. You have access to a nurse at all times for urgent questions. Please call the main number to the clinic [phone] and follow the prompts.  For any non-urgent questions, you may also contact your provider using MyChart. We now offer e-Visits for anyone 49 and older to request care online for non-urgent symptoms. For details visit [email or web address].   Also download the MyChart app! Go to the app store, search "MyChart", open the app, select [facility], and log in with your MyChart username and password.  Cisplatin Injection What is this medication? CISPLATIN (SIS pla tin) treats some types of cancer. It works by slowing down the growth of cancer cells. This medicine may be used for other purposes; ask your health care provider or pharmacist if you have questions. COMMON BRAND NAME(S): Platinol, Platinol -AQ What should I tell my care team before I take this medication? They need to know if you have any of these conditions: Eye disease, vision problems Hearing problems Kidney disease Low blood counts, such as low white cells, platelets, or red blood cells Tingling of the fingers  or toes, or other nerve disorder An unusual or allergic reaction to cisplatin, carboplatin, oxaliplatin, other medications, foods, dyes, or preservatives If you or your partner are pregnant or trying to get pregnant Breast-feeding How should I use this medication? This medication is injected into a vein. It is given by your care  team in a hospital or clinic setting. Talk to your care team about the use of this medication in children. Special care may be needed. Overdosage: If you think you have taken too much of this medicine contact a poison control center or emergency room at once. NOTE: This medicine is only for you. Do not share this medicine with others. What if I miss a dose? Keep appointments for follow-up doses. It is important not to miss your dose. Call your care team if you are unable to keep an appointment. What may interact with this medication? Do not take this medication with any of the following: Live virus vaccines This medication may also interact with the following: Certain antibiotics, such as amikacin, gentamicin, neomycin, polymyxin B, streptomycin, tobramycin, vancomycin Foscarnet This list may not describe all possible interactions. Give your health care provider a list of all the medicines, herbs, non-prescription drugs, or dietary supplements you use. Also tell them if you smoke, drink alcohol, or use illegal drugs. Some items may interact with your medicine. What should I watch for while using this medication? Your condition will be monitored carefully while you are receiving this medication. You may need blood work done while taking this medication. This medication may make you feel generally unwell. This is not uncommon, as chemotherapy can affect healthy cells as well as cancer cells. Report any side effects. Continue your course of treatment even though you feel ill unless your care team tells you to stop. This medication may increase your risk of getting an infection. Call your care team for advice if you get a fever, chills, sore throat, or other symptoms of a cold or flu. Do not treat yourself. Try to avoid being around people who are sick. Avoid taking medications that contain aspirin, acetaminophen, ibuprofen, naproxen, or ketoprofen unless instructed by your care team. These medications  may hide a fever. This medication may increase your risk to bruise or bleed. Call your care team if you notice any unusual bleeding. Be careful brushing or flossing your teeth or using a toothpick because you may get an infection or bleed more easily. If you have any dental work done, tell your dentist you are receiving this medication. Drink fluids as directed while you are taking this medication. This will help protect your kidneys. Call your care team if you get diarrhea. Do not treat yourself. Talk to your care team if you or your partner wish to become pregnant or think you might be pregnant. This medication can cause serious birth defects if taken during pregnancy and for 14 months after the last dose. A negative pregnancy test is required before starting this medication. A reliable form of contraception is recommended while taking this medication and for 14 months after the last dose. Talk to your care team about effective forms of contraception. Do not father a child while taking this medication and for 11 months after the last dose. Use a condom during sex during this time period. Do not breast-feed while taking this medication. This medication may cause infertility. Talk to your care team if you are concerned about your fertility. What side effects may I notice from receiving this medication? Side  effects that you should report to your care team as soon as possible: Allergic reactions--skin rash, itching, hives, swelling of the face, lips, tongue, or throat Eye pain, change in vision, vision loss Hearing loss, ringing in ears Infection--fever, chills, cough, sore throat, wounds that don't heal, pain or trouble when passing urine, general feeling of discomfort or being unwell Kidney injury--decrease in the amount of urine, swelling of the ankles, hands, or feet Low red blood cell level--unusual weakness or fatigue, dizziness, headache, trouble breathing Painful swelling, warmth, or redness  of the skin, blisters or sores at the infusion site Pain, tingling, or numbness in the hands or feet Unusual bruising or bleeding Side effects that usually do not require medical attention (report to your care team if they continue or are bothersome): Hair loss Nausea Vomiting This list may not describe all possible side effects. Call your doctor for medical advice about side effects. You may report side effects to FDA at 1-800-FDA-1088. Where should I keep my medication? This medication is given in a hospital or clinic. It will not be stored at home. NOTE: This sheet is a summary. It may not cover all possible information. If you have questions about this medicine, talk to your doctor, pharmacist, or health care provider.  2024 Elsevier/Gold Standard (2021-08-29 00:00:00)

## 2023-08-17 ENCOUNTER — Ambulatory Visit
Admission: RE | Admit: 2023-08-17 | Discharge: 2023-08-17 | Disposition: A | Discharge status: Home / Self Care | Payer: Medicare Other | Source: Ambulatory Visit | Attending: Radiation Oncology | Admitting: Radiation Oncology

## 2023-08-17 ENCOUNTER — Other Ambulatory Visit: Payer: Self-pay

## 2023-08-17 DIAGNOSIS — Z510 Encounter for antineoplastic radiation therapy: Secondary | ICD-10-CM | POA: Diagnosis not present

## 2023-08-17 LAB — RAD ONC ARIA SESSION SUMMARY
Course Elapsed Days: 36
Plan Fractions Treated to Date: 22
Plan Prescribed Dose Per Fraction: 2 Gy
Plan Total Fractions Prescribed: 35
Plan Total Prescribed Dose: 70 Gy
Reference Point Dosage Given to Date: 44 Gy
Reference Point Session Dosage Given: 2 Gy
Session Number: 22

## 2023-08-18 ENCOUNTER — Ambulatory Visit
Admission: RE | Admit: 2023-08-18 | Discharge: 2023-08-18 | Disposition: A | Discharge status: Home / Self Care | Payer: Medicare Other | Source: Ambulatory Visit | Attending: Radiation Oncology | Admitting: Radiation Oncology

## 2023-08-18 ENCOUNTER — Other Ambulatory Visit: Payer: Self-pay

## 2023-08-18 DIAGNOSIS — Z510 Encounter for antineoplastic radiation therapy: Secondary | ICD-10-CM | POA: Diagnosis not present

## 2023-08-18 LAB — RAD ONC ARIA SESSION SUMMARY
Course Elapsed Days: 37
Plan Fractions Treated to Date: 23
Plan Prescribed Dose Per Fraction: 2 Gy
Plan Total Fractions Prescribed: 35
Plan Total Prescribed Dose: 70 Gy
Reference Point Dosage Given to Date: 46 Gy
Reference Point Session Dosage Given: 2 Gy
Session Number: 23

## 2023-08-19 ENCOUNTER — Ambulatory Visit
Admission: RE | Admit: 2023-08-19 | Discharge: 2023-08-19 | Discharge status: Home / Self Care | Payer: Medicare Other | Source: Ambulatory Visit | Attending: Radiation Oncology | Admitting: Radiation Oncology

## 2023-08-19 ENCOUNTER — Other Ambulatory Visit: Payer: Self-pay

## 2023-08-19 DIAGNOSIS — Z510 Encounter for antineoplastic radiation therapy: Secondary | ICD-10-CM | POA: Diagnosis not present

## 2023-08-19 LAB — RAD ONC ARIA SESSION SUMMARY
Course Elapsed Days: 38
Plan Fractions Treated to Date: 24
Plan Prescribed Dose Per Fraction: 2 Gy
Plan Total Fractions Prescribed: 35
Plan Total Prescribed Dose: 70 Gy
Reference Point Dosage Given to Date: 48 Gy
Reference Point Session Dosage Given: 2 Gy
Session Number: 24

## 2023-08-20 ENCOUNTER — Ambulatory Visit
Admission: RE | Admit: 2023-08-20 | Discharge: 2023-08-20 | Disposition: A | Discharge status: Home / Self Care | Payer: Medicare Other | Source: Ambulatory Visit | Attending: Radiation Oncology | Admitting: Radiation Oncology

## 2023-08-20 ENCOUNTER — Other Ambulatory Visit: Payer: Self-pay

## 2023-08-20 DIAGNOSIS — Z510 Encounter for antineoplastic radiation therapy: Secondary | ICD-10-CM | POA: Diagnosis not present

## 2023-08-20 LAB — RAD ONC ARIA SESSION SUMMARY
Course Elapsed Days: 39
Plan Fractions Treated to Date: 25
Plan Prescribed Dose Per Fraction: 2 Gy
Plan Total Fractions Prescribed: 35
Plan Total Prescribed Dose: 70 Gy
Reference Point Dosage Given to Date: 50 Gy
Reference Point Session Dosage Given: 2 Gy
Session Number: 25

## 2023-08-20 MED FILL — Fosaprepitant Dimeglumine For IV Infusion 150 MG (Base Eq): INTRAVENOUS | Qty: 5 | Status: AC

## 2023-08-23 ENCOUNTER — Ambulatory Visit: Admission: RE | Admit: 2023-08-23 | Source: Ambulatory Visit

## 2023-08-23 ENCOUNTER — Inpatient Hospital Stay

## 2023-08-23 ENCOUNTER — Inpatient Hospital Stay: Admitting: Oncology

## 2023-08-23 ENCOUNTER — Ambulatory Visit: Payer: Self-pay | Admitting: Licensed Clinical Social Worker

## 2023-08-23 ENCOUNTER — Ambulatory Visit: Payer: Medicare Other

## 2023-08-24 ENCOUNTER — Other Ambulatory Visit: Payer: Self-pay

## 2023-08-24 ENCOUNTER — Other Ambulatory Visit: Payer: Self-pay | Admitting: Oncology

## 2023-08-24 ENCOUNTER — Inpatient Hospital Stay: Admitting: Nurse Practitioner

## 2023-08-24 ENCOUNTER — Inpatient Hospital Stay

## 2023-08-24 ENCOUNTER — Ambulatory Visit
Admission: RE | Admit: 2023-08-24 | Discharge: 2023-08-24 | Disposition: A | Discharge status: Home / Self Care | Payer: Medicare Other | Source: Ambulatory Visit | Attending: Radiation Oncology | Admitting: Radiation Oncology

## 2023-08-24 ENCOUNTER — Encounter: Payer: Self-pay | Admitting: Nurse Practitioner

## 2023-08-24 ENCOUNTER — Ambulatory Visit

## 2023-08-24 VITALS — BP 104/63 | HR 71 | Temp 96.5°F | Resp 18 | Wt 157.0 lb

## 2023-08-24 DIAGNOSIS — C109 Malignant neoplasm of oropharynx, unspecified: Secondary | ICD-10-CM

## 2023-08-24 DIAGNOSIS — D6959 Other secondary thrombocytopenia: Secondary | ICD-10-CM | POA: Diagnosis not present

## 2023-08-24 DIAGNOSIS — Z5189 Encounter for other specified aftercare: Secondary | ICD-10-CM

## 2023-08-24 DIAGNOSIS — T451X5A Adverse effect of antineoplastic and immunosuppressive drugs, initial encounter: Secondary | ICD-10-CM | POA: Diagnosis not present

## 2023-08-24 DIAGNOSIS — D6481 Anemia due to antineoplastic chemotherapy: Secondary | ICD-10-CM | POA: Diagnosis not present

## 2023-08-24 DIAGNOSIS — Z5111 Encounter for antineoplastic chemotherapy: Secondary | ICD-10-CM | POA: Diagnosis not present

## 2023-08-24 DIAGNOSIS — Z510 Encounter for antineoplastic radiation therapy: Secondary | ICD-10-CM | POA: Diagnosis not present

## 2023-08-24 LAB — RAD ONC ARIA SESSION SUMMARY
Course Elapsed Days: 43
Plan Fractions Treated to Date: 26
Plan Prescribed Dose Per Fraction: 2 Gy
Plan Total Fractions Prescribed: 35
Plan Total Prescribed Dose: 70 Gy
Reference Point Dosage Given to Date: 52 Gy
Reference Point Session Dosage Given: 2 Gy
Session Number: 26

## 2023-08-24 LAB — COMPREHENSIVE METABOLIC PANEL WITH GFR
ALT: <5 U/L (ref 0–44)
AST: 12 U/L — ABNORMAL LOW (ref 15–41)
Albumin: 3.9 g/dL (ref 3.5–5.0)
Alkaline Phosphatase: 37 U/L — ABNORMAL LOW (ref 38–126)
Anion gap: 10 (ref 5–15)
BUN: 15 mg/dL (ref 8–23)
CO2: 25 mmol/L (ref 22–32)
Calcium: 7.7 mg/dL — ABNORMAL LOW (ref 8.9–10.3)
Chloride: 99 mmol/L (ref 98–111)
Creatinine, Ser: 1.19 mg/dL (ref 0.61–1.24)
GFR, Estimated: >60 mL/min (ref >60)
Glucose, Bld: 88 mg/dL (ref 70–99)
Potassium: 3.8 mmol/L (ref 3.5–5.1)
Sodium: 134 mmol/L — ABNORMAL LOW (ref 135–145)
Total Bilirubin: 1.2 mg/dL (ref 0.0–1.2)
Total Protein: 6.5 g/dL (ref 6.5–8.1)

## 2023-08-24 LAB — CBC WITH DIFFERENTIAL/PLATELET
Abs Immature Granulocytes: 0.02 10*3/uL (ref 0.00–0.07)
Basophils Absolute: 0 10*3/uL (ref 0.0–0.1)
Basophils Relative: 0 %
Eosinophils Absolute: 0 10*3/uL (ref 0.0–0.5)
Eosinophils Relative: 0 %
HCT: 22.2 % — ABNORMAL LOW (ref 39.0–52.0)
Hemoglobin: 7.7 g/dL — ABNORMAL LOW (ref 13.0–17.0)
Immature Granulocytes: 1 %
Lymphocytes Relative: 10 %
Lymphs Abs: 0.3 10*3/uL — ABNORMAL LOW (ref 0.7–4.0)
MCH: 30.6 pg (ref 26.0–34.0)
MCHC: 34.7 g/dL (ref 30.0–36.0)
MCV: 88.1 fL (ref 80.0–100.0)
Monocytes Absolute: 0.3 10*3/uL (ref 0.1–1.0)
Monocytes Relative: 9 %
Neutro Abs: 2.3 10*3/uL (ref 1.7–7.7)
Neutrophils Relative %: 80 %
Platelets: 69 10*3/uL — ABNORMAL LOW (ref 150–400)
RBC: 2.52 MIL/uL — ABNORMAL LOW (ref 4.22–5.81)
RDW: 14.2 % (ref 11.5–15.5)
WBC: 2.9 10*3/uL — ABNORMAL LOW (ref 4.0–10.5)
nRBC: 0 % (ref 0.0–0.2)

## 2023-08-24 LAB — ABO/RH: ABO/RH(D): A POS

## 2023-08-24 LAB — MAGNESIUM: Magnesium: 1 mg/dL — ABNORMAL LOW (ref 1.7–2.4)

## 2023-08-24 MED ORDER — DEXAMETHASONE SODIUM PHOSPHATE 10 MG/ML IJ SOLN
10.0000 mg | Freq: Once | INTRAMUSCULAR | Status: AC
  Administered 2023-08-24: 10 mg via INTRAVENOUS
  Filled 2023-08-24: qty 1

## 2023-08-24 MED ORDER — SODIUM CHLORIDE 0.9% FLUSH
10.0000 mL | Freq: Once | INTRAVENOUS | Status: AC
  Administered 2023-08-24: 10 mL via INTRAVENOUS
  Filled 2023-08-24: qty 10

## 2023-08-24 MED ORDER — MAGNESIUM SULFATE 2 GM/50ML IV SOLN
2.0000 g | Freq: Once | INTRAVENOUS | Status: AC
  Administered 2023-08-24: 2 g via INTRAVENOUS
  Filled 2023-08-24: qty 50

## 2023-08-24 MED ORDER — DEXAMETHASONE 2 MG PO TABS: 2.0000 mg | ORAL_TABLET | Freq: Every day | ORAL | 0 refills | Status: DC

## 2023-08-24 MED ORDER — SODIUM CHLORIDE 0.9 % IV SOLN
Freq: Once | INTRAVENOUS | Status: AC
  Filled 2023-08-24: qty 250

## 2023-08-24 MED ORDER — SLOW-MAG 71.5-119 MG PO TBEC: 2.0000 | DELAYED_RELEASE_TABLET | Freq: Every day | ORAL | Status: DC

## 2023-08-24 MED ORDER — CALCIUM CARBONATE 600 MG PO TABS: 600.0000 mg | ORAL_TABLET | Freq: Every day | ORAL | 0 refills | Status: AC

## 2023-08-24 NOTE — Progress Notes (Signed)
 Symptom Management Clinic  [facility] at [facility] A Department of the [place]. [facility] [address], [address] [place], [place] [address] [phone]) [phone] (fax)  Patient Care Team: [name], MD as PCP - General (Internal Medicine)   Name of the patient: Ricky [name]  [id number]  07/14/1955   Date of visit: 08/24/23  Diagnosis- SCC of oropharynx  Chief complaint/ Reason for visit- weakness  Heme/Onc history:  Oncology History  Squamous cell carcinoma of oropharynx (HCC)  06/22/2023 Initial Diagnosis   Squamous cell carcinoma of oropharynx (HCC)   06/22/2023 Cancer Staging   Staging form: Pharynx - HPV-Mediated Oropharynx, AJCC 8th Edition - Clinical stage from 06/22/2023: Stage I (cT2, cN1, cM0, p16+) - Signed by Creig Hines, MD on 07/12/2023 Stage prefix: Initial diagnosis   07/12/2023 -  Chemotherapy   Patient is on Treatment Plan : HEAD/NECK Cisplatin (40) q7d       Interval history- Patient is 68 year old male currently undergoing concurrent cisplatin chemotherapy and radiation for above history of head and neck cancer who presents to Symptom management clinic for complaints of weakness. Symptoms have been present for a couple of weeks but gradually worsening. He has felt like he would fall at times d/t symptoms. He has chronic constipation but experienced diarrhea yesterday. Now resolved. No fevers or chills. No nausea or vomiting. Ongoing depressed mood. Denies urinary symptoms. Denies chest pain or palpitations. Weight is down. Drinking water and taking medications as prescribed.   Review of systems- Review of Systems  Constitutional:  Positive for malaise/fatigue and weight loss. Negative for chills and fever.  HENT:  Negative for hearing loss, nosebleeds, sore throat and tinnitus.   Eyes:  Negative for blurred vision and double vision.  Respiratory:  Negative for cough, hemoptysis, shortness of  breath and wheezing.   Cardiovascular:  Negative for chest pain, palpitations and leg swelling.  Gastrointestinal:  Positive for constipation and diarrhea. Negative for abdominal pain, blood in stool, melena, nausea and vomiting.  Genitourinary:  Negative for dysuria, hematuria and urgency.  Musculoskeletal:  Negative for back pain, falls, joint pain and myalgias.  Skin:  Negative for itching and rash.  Neurological:  Positive for weakness. Negative for dizziness, tingling, sensory change, loss of consciousness and headaches.  Endo/Heme/Allergies:  Negative for environmental allergies. Does not bruise/bleed easily.  Psychiatric/Behavioral:  Positive for depression. The patient is not nervous/anxious and does not have insomnia.     Allergies  Allergen Reactions   Mirtazapine Rash   Other Itching and Rash    ETCO2 tubing. Medtronic Microstream advance   Past Medical History:  Diagnosis Date   Anxiety    Arthritis    Carpal tunnel syndrome    Depression    Diabetes (HCC)    Hyperlipidemia    Hypertension    Loss of teeth due to extraction    Neoplasm related pain    Parkinson's disease [facility])    Past Surgical History:  Procedure Laterality Date   CARPAL TUNNEL RELEASE Right 11/28/2020   Procedure: CARPAL TUNNEL RELEASE ENDOSCOPIC;  Surgeon: Christena Flake, MD;  Location: [facility] ORS;  Service: Orthopedics;  Laterality: Right;   CARPAL TUNNEL RELEASE Left 01/22/2021   Procedure: CARPAL TUNNEL RELEASE ENDOSCOPIC;  Surgeon: Christena Flake, MD;  Location: [facility] ORS;  Service: Orthopedics;  Laterality: Left;   IR IMAGING GUIDED PORT INSERTION  06/30/2023   KNEE ARTHROSCOPY Right    Social History  Socioeconomic History   Marital status: Single    Spouse name: [name]   Number of children: Not on file   Years of education: Not on file   Highest education level: Not on file  Occupational History   Not on file  Tobacco Use   Smoking status: Never   Smokeless tobacco: Never  Vaping Use    Vaping status: Never Used  Substance and Sexual Activity   Alcohol use: Not Currently   Drug use: Never   Sexual activity: Not Currently    Birth control/protection: None  Other Topics Concern   Not on file  Social History Narrative   Lives with friend   Social Drivers of Health   Financial Resource Strain: Not on file  Food Insecurity: No Food Insecurity (06/22/2023)   Hunger Vital Sign    Worried About Running Out of Food in the Last Year: Never true    Ran Out of Food in the Last Year: Never true  Transportation Needs: No Transportation Needs (06/22/2023)   PRAPARE - Administrator, Civil Service (Medical): No    Lack of Transportation (Non-Medical): No  Physical Activity: Not on file  Stress: Not on file  Social Connections: Not on file  Intimate Partner Violence: Not At Risk (06/22/2023)   Humiliation, Afraid, Rape, and Kick questionnaire    Fear of Current or Ex-Partner: No    Emotionally Abused: No    Physically Abused: No    Sexually Abused: No   Family History  Problem Relation Age of Onset   Diabetes Mother    Hypertension Mother    CVA Father    Heart attack Father    Depression Sister    Depression Brother    Current Outpatient Medications:    atenolol (TENORMIN) 50 MG tablet, Take 50 mg by mouth daily., Disp: , Rfl:    buPROPion (WELLBUTRIN XL) 150 MG 24 hr tablet, Take 1 tablet (150 mg total) by mouth daily. Total of 450 mg daily. Take along with 300 mg tab, Disp: 30 tablet, Rfl: 5   buPROPion (WELLBUTRIN XL) 300 MG 24 hr tablet, Take 1 tablet (300 mg total) by mouth daily. Total of 450 mg daily. Take along with 150 mg tab, Disp: 30 tablet, Rfl: 5   busPIRone (BUSPAR) 7.5 MG tablet, Take 1 tablet (7.5 mg total) by mouth 2 (two) times daily., Disp: 60 tablet, Rfl: 3   carbidopa-levodopa (SINEMET IR) 25-100 MG tablet, Take 1 tablet by mouth 5 (five) times daily. 3 in morning 2 in evening, Disp: , Rfl:    [START ON 09/09/2023] clonazePAM (KLONOPIN) 1  MG tablet, Take 1 tablet (1 mg total) by mouth 3 (three) times daily as needed for anxiety., Disp: 90 tablet, Rfl: 0   dexamethasone (DECADRON) 2 MG tablet, Take 1 tablet by mouth daily x 1 week, then take 1/2 tablet daily x 1 week. Do not abruptly stop., Disp: 14 tablet, Rfl: 1   fentaNYL (DURAGESIC) 25 MCG/HR, Place 1 patch onto the skin every 3 (three) days., Disp: 10 patch, Rfl: 0   folic acid (FOLVITE) 1 MG tablet, Take 1 tablet (1 mg total) by mouth daily., Disp: 30 tablet, Rfl: 3   ketoconazole (NIZORAL) 2 % shampoo, Apply 1 application  topically every other day., Disp: , Rfl:    lidocaine-prilocaine (EMLA) cream, Apply to affected area once, Disp: 30 g, Rfl: 3   magic mouthwash (multi-ingredient) oral suspension, Swish and swallow 5-10 mLs by mouth 4 (four) times  daily., Disp: 480 mL, Rfl: 3   magic mouthwash w/lidocaine SOLN, Take 5-10 mLs by mouth 4 (four) times daily as needed for mouth pain. Suspension contains equal amounts of Maalox Extra Strength, nystatin, diphenhydramine and lidocaine., Disp: 480 mL, Rfl: 3   metFORMIN (GLUCOPHAGE) 500 MG tablet, Take 500 mg by mouth daily with breakfast., Disp: , Rfl:    ondansetron (ZOFRAN) 8 MG tablet, Take 1 tablet (8 mg total) by mouth every 8 (eight) hours as needed for nausea or vomiting. Start on the third day after cisplatin., Disp: 30 tablet, Rfl: 1   Oxycodone HCl 10 MG TABS, Take 1 tablet (10 mg total) by mouth every 4 (four) hours as needed., Disp: 90 tablet, Rfl: 0   prochlorperazine (COMPAZINE) 10 MG tablet, Take 1 tablet (10 mg total) by mouth every 6 (six) hours as needed (Nausea or vomiting)., Disp: 30 tablet, Rfl: 1   rosuvastatin (CRESTOR) 20 MG tablet, Take 20 mg by mouth daily., Disp: , Rfl:    [START ON 09/04/2023] sertraline (ZOLOFT) 100 MG tablet, Take 1.5 tablets (150 mg total) by mouth at bedtime., Disp: 45 tablet, Rfl: 3   sildenafil (VIAGRA) 50 MG tablet, Take 50 mg by mouth daily as needed for erectile dysfunction., Disp:  , Rfl:    sucralfate (CARAFATE) 1 g tablet, Take 1 tablet (1 g total) by mouth 3 (three) times daily before meals. Dissolve in 4 tbs of warm water, swish and swallow, Disp: 90 tablet, Rfl: 1   tadalafil (CIALIS) 5 MG tablet, Take 5 mg by mouth daily., Disp: , Rfl:    tamsulosin (FLOMAX) 0.4 MG CAPS capsule, Take by mouth., Disp: , Rfl:    traZODone (DESYREL) 50 MG tablet, Take 0.5-2 tablets (25-100 mg total) by mouth at bedtime as needed for sleep., Disp: 60 tablet, Rfl: 1   dexamethasone (DECADRON) 4 MG tablet, Take 2 tablets (8 mg) by mouth daily x 3 days starting the day after cisplatin chemotherapy. Take with food., Disp: 30 tablet, Rfl: 1   methylphenidate (RITALIN) 5 MG tablet, Take 1 tablet (5 mg total) by mouth daily. (Patient not taking: Reported on 07/16/2023), Disp: 30 tablet, Rfl: 0   VICTOZA 18 MG/3ML SOPN, Inject 1.2 mg into the skin daily. (Patient not taking: Reported on 06/22/2023), Disp: , Rfl:   Physical exam:  Vitals:   08/24/23 1116  BP: 104/63  Pulse: 71  Resp: 18  Temp: (!) 96.5 F (35.8 C)  TempSrc: Tympanic  SpO2: 100%  Weight: 157 lb (71.2 kg)   Physical Exam Constitutional:      General: He is not in acute distress.    Appearance: He is well-developed. He is not ill-appearing.     Comments: Fatigued appearing. Accompanied by wife [name]  HENT:     Head: Normocephalic and atraumatic.     Mouth/Throat:     Pharynx: No oropharyngeal exudate.  Cardiovascular:     Rate and Rhythm: Normal rate and regular rhythm.  Pulmonary:     Effort: Pulmonary effort is normal.     Breath sounds: No wheezing.  Abdominal:     General: There is no distension.     Palpations: Abdomen is soft.     Tenderness: There is no abdominal tenderness.  Musculoskeletal:        General: No deformity.     Comments: Ambulating w/o aids  Skin:    General: Skin is warm and dry.     Coloration: Skin is pale.     Findings:  No bruising.  Neurological:     Mental Status: He is alert and  oriented to person, place, and time.  Psychiatric:        Behavior: Behavior normal.     Comments: Flat affect        Latest Ref Rng & Units 08/24/2023   10:55 AM  CMP  Glucose 70 - 99 mg/dL 88   BUN 8 - 23 mg/dL 15   Creatinine 1.61 - 1.24 mg/dL 0.96   Sodium 045 - 409 mmol/L 134   Potassium 3.5 - 5.1 mmol/L 3.8   Chloride 98 - 111 mmol/L 99   CO2 22 - 32 mmol/L 25   Calcium 8.9 - 10.3 mg/dL 7.7   Total Protein 6.5 - 8.1 g/dL 6.5   Total Bilirubin 0.0 - 1.2 mg/dL 1.2   Alkaline Phos 38 - 126 U/L 37   AST 15 - 41 U/L 12   ALT 0 - 44 U/L <5       Latest Ref Rng & Units 08/24/2023   10:55 AM  CBC  WBC 4.0 - 10.5 K/uL 2.9   Hemoglobin 13.0 - 17.0 g/dL 7.7   Hematocrit 81.1 - 52.0 % 22.2   Platelets 150 - 400 K/uL 69    No results found.  Assessment and plan- Patient is a 68 y.o. male currently undergoing concurrent chemo & radiation for head and neck cancer who presents to symptom management clinic for   Weakness- secondary to worsening anemia, effects of chemotherapy and radiation in addition to weight loss. Plan for IV fluids and decadron 10 mg IV in clinic today. starting tomorrow, he will start oral decadron 2 mg daily x 5 days.  Stage I Squamous Cell Carcinoma of the Orophaynx- s/p cycle 5 of cisplatin on 08/16/23. Missed cycle 6 due yesterday d/t symptoms. Plan to hold off on this week's treatment and Dr [name] will see him back next week for reevaluation and consideration of cycle 6 of weekly cisplatin.  Anemia- secondary to radiation and chemotherapy. Hmg 7.7. Iron studies were normal. He has started oral folate and b12. Symptomatic. Recommend blood transfusion. Discussed risks and benefits as well as alternatives. Risks of transfusion were discussed with patient including, but are not limited to: fluid overload, febrile transfusion reaction, transfusion reaction, Hepatitis B, Hepatitis C, HIV, as well as other currently unknown infectious complications. Patient agrees to proceed  and will sign a written consent prior to the procedure.  Hypocalcemia & Hypomagnesemia- Calcium 7.7. Magnesium 1.0. Likely related to poor intake. Start oral calcium 600 mg daily. Plan for 2 g IV Magnesium in clinic today then start oral slow mag.  Constipation- likely related to intake, decreased mobility, decreased fiber intake, and medications. Hold constipation medications if he experiences diarrhea.  Thrombocytopenia- plt 69. Secondary to chemotherapy. Monitor.   Disposition:  Thursday- blood transfusion Follow up per IS or rtc sooner if symptoms don't improve or worsen- la  Visit Diagnosis 1. Antineoplastic chemotherapy induced anemia   2. Chemotherapy-induced thrombocytopenia   3. Convalescence after radiotherapy   4. Convalescence following chemotherapy    Patient expressed understanding and was in agreement with this plan. He also understands that He can call clinic at any time with any questions, concerns, or complaints.   Thank you for allowing me to participate in the care of this very pleasant patient.   Consuello Masse, DNP, AGNP-C, AOCNP [facility] [phone]

## 2023-08-24 NOTE — Progress Notes (Signed)
 [facility] CSW Progress Note  Visual merchandiser met with patient to assess needs and provide support.  Patient appeared anxious to finish his treatment and wanted to go home.  He engaged minimally in conversation.  He stated he had no needs at this time.      Kennth Peal, LCSW Clinical Social Worker [facility]

## 2023-08-24 NOTE — Patient Instructions (Addendum)
 Today you received IV fluids, decadron which is a steroid and IV magnesium. We will plan to give you a blood transfusion later this week. I've sent prescriptions for decadron 2 mg tablets (start this tomorrow 08/25/23), calcium, and magnesium. Start the calcium and magnesium supplements today. Continue your folate supplement.

## 2023-08-25 ENCOUNTER — Ambulatory Visit

## 2023-08-25 ENCOUNTER — Ambulatory Visit: Admission: RE | Admit: 2023-08-25 | Payer: Medicare Other | Source: Ambulatory Visit

## 2023-08-25 ENCOUNTER — Other Ambulatory Visit: Payer: Self-pay | Admitting: Oncology

## 2023-08-25 LAB — PREPARE RBC (CROSSMATCH)

## 2023-08-25 MED ORDER — OXYCODONE HCL 10 MG PO TABS: 10.0000 mg | ORAL_TABLET | ORAL | 0 refills | Status: DC | PRN

## 2023-08-26 ENCOUNTER — Ambulatory Visit: Payer: Medicare Other

## 2023-08-26 ENCOUNTER — Inpatient Hospital Stay

## 2023-08-26 ENCOUNTER — Ambulatory Visit

## 2023-08-26 VITALS — BP 125/74 | HR 73 | Temp 98.3°F | Resp 18

## 2023-08-26 DIAGNOSIS — Z5111 Encounter for antineoplastic chemotherapy: Secondary | ICD-10-CM | POA: Diagnosis not present

## 2023-08-26 DIAGNOSIS — Z95828 Presence of other vascular implants and grafts: Secondary | ICD-10-CM

## 2023-08-26 DIAGNOSIS — D6481 Anemia due to antineoplastic chemotherapy: Secondary | ICD-10-CM

## 2023-08-26 MED ORDER — SODIUM CHLORIDE 0.9% IV SOLUTION
250.0000 mL | INTRAVENOUS | Status: DC
  Administered 2023-08-26: 100 mL via INTRAVENOUS
  Filled 2023-08-26: qty 250

## 2023-08-26 MED ORDER — HEPARIN SOD (PORK) LOCK FLUSH 100 UNIT/ML IV SOLN
500.0000 [IU] | Freq: Once | INTRAVENOUS | Status: AC
  Administered 2023-08-26: 500 [IU] via INTRAVENOUS
  Filled 2023-08-26: qty 5

## 2023-08-26 NOTE — Progress Notes (Signed)
 Nutrition Follow-up:  Add on as radiation cancelled for tomorrow and nutrition appointment scheduled after radiation  Patient with right glossotonsillar sulcus mass, p 16+.  Patient receiving concurrent radiation and cisplatin.  Patient did not show up for PEG consultation  Met with patient and significant other [name] during blood transfusion.  Patient did not receive chemotherapy this week and only 2 days of radiation.  Decreased intake/appetite.  Ate some pimento cheese sandwich today and flan.  Drinking gatorade and sweet tea.  Reports no taste and sore throat.  Trying to time pain medication around time of eating.      Medications: decadron added for 5 days  Labs: reviewed  Anthropometrics:   Weight 157 lb on 4/15 162 lb 7.7 oz on 4/4 165 lb 4.8 oz on 3/17 169 lb 3/7 177 lb on 2/13 184 lb on 02/25/23  7% weight loss in the last month and half, significant  NUTRITION DIAGNOSIS: Inadequate oral intake continues    INTERVENTION:  Encouraged small sips, bites q 2 hours Encouraged hydration and shakes     MONITORING, EVALUATION, GOAL: weight trends, intake   NEXT VISIT: Tuesday, April 29 after radiation  Hidaya Daniel B. Zollie Hipp, CSO, LDN Registered Dietitian [phone]

## 2023-08-27 ENCOUNTER — Ambulatory Visit: Payer: Medicare Other

## 2023-08-27 ENCOUNTER — Ambulatory Visit

## 2023-08-27 ENCOUNTER — Inpatient Hospital Stay

## 2023-08-27 LAB — TYPE AND SCREEN
ABO/RH(D): A POS
Antibody Screen: NEGATIVE
Unit division: 0

## 2023-08-27 LAB — BPAM RBC
Blood Product Expiration Date: 202505092359
ISSUE DATE / TIME: 202504171353
Unit Type and Rh: 600

## 2023-08-27 MED FILL — Fosaprepitant Dimeglumine For IV Infusion 150 MG (Base Eq): INTRAVENOUS | Qty: 5 | Status: AC

## 2023-08-30 ENCOUNTER — Encounter: Payer: Self-pay | Admitting: Oncology

## 2023-08-30 ENCOUNTER — Other Ambulatory Visit

## 2023-08-30 ENCOUNTER — Ambulatory Visit

## 2023-08-30 ENCOUNTER — Inpatient Hospital Stay

## 2023-08-30 ENCOUNTER — Inpatient Hospital Stay (HOSPITAL_BASED_OUTPATIENT_CLINIC_OR_DEPARTMENT_OTHER): Admitting: Oncology

## 2023-08-30 ENCOUNTER — Other Ambulatory Visit: Payer: Self-pay | Admitting: *Deleted

## 2023-08-30 VITALS — BP 93/59 | HR 66 | Temp 97.3°F | Resp 16 | Wt 161.5 lb

## 2023-08-30 DIAGNOSIS — T451X5A Adverse effect of antineoplastic and immunosuppressive drugs, initial encounter: Secondary | ICD-10-CM

## 2023-08-30 DIAGNOSIS — E876 Hypokalemia: Secondary | ICD-10-CM

## 2023-08-30 DIAGNOSIS — E538 Deficiency of other specified B group vitamins: Secondary | ICD-10-CM | POA: Diagnosis not present

## 2023-08-30 DIAGNOSIS — D6481 Anemia due to antineoplastic chemotherapy: Secondary | ICD-10-CM | POA: Diagnosis not present

## 2023-08-30 DIAGNOSIS — C109 Malignant neoplasm of oropharynx, unspecified: Secondary | ICD-10-CM

## 2023-08-30 DIAGNOSIS — Z5111 Encounter for antineoplastic chemotherapy: Secondary | ICD-10-CM

## 2023-08-30 LAB — CBC WITH DIFFERENTIAL (CANCER CENTER ONLY)
Abs Immature Granulocytes: 0.01 10*3/uL (ref 0.00–0.07)
Basophils Absolute: 0 10*3/uL (ref 0.0–0.1)
Basophils Relative: 1 %
Eosinophils Absolute: 0 10*3/uL (ref 0.0–0.5)
Eosinophils Relative: 1 %
HCT: 25.7 % — ABNORMAL LOW (ref 39.0–52.0)
Hemoglobin: 8.6 g/dL — ABNORMAL LOW (ref 13.0–17.0)
Immature Granulocytes: 0 %
Lymphocytes Relative: 17 %
Lymphs Abs: 0.4 10*3/uL — ABNORMAL LOW (ref 0.7–4.0)
MCH: 30.1 pg (ref 26.0–34.0)
MCHC: 33.5 g/dL (ref 30.0–36.0)
MCV: 89.9 fL (ref 80.0–100.0)
Monocytes Absolute: 0.2 10*3/uL (ref 0.1–1.0)
Monocytes Relative: 7 %
Neutro Abs: 1.7 10*3/uL (ref 1.7–7.7)
Neutrophils Relative %: 74 %
Platelet Count: 67 10*3/uL — ABNORMAL LOW (ref 150–400)
RBC: 2.86 MIL/uL — ABNORMAL LOW (ref 4.22–5.81)
RDW: 16.8 % — ABNORMAL HIGH (ref 11.5–15.5)
WBC Count: 2.3 10*3/uL — ABNORMAL LOW (ref 4.0–10.5)
nRBC: 0 % (ref 0.0–0.2)

## 2023-08-30 LAB — MAGNESIUM: Magnesium: 1.2 mg/dL — ABNORMAL LOW (ref 1.7–2.4)

## 2023-08-30 LAB — BASIC METABOLIC PANEL - CANCER CENTER ONLY
Anion gap: 10 (ref 5–15)
BUN: 13 mg/dL (ref 8–23)
CO2: 26 mmol/L (ref 22–32)
Calcium: 8.2 mg/dL — ABNORMAL LOW (ref 8.9–10.3)
Chloride: 101 mmol/L (ref 98–111)
Creatinine: 1.15 mg/dL (ref 0.61–1.24)
GFR, Estimated: >60 mL/min (ref >60)
Glucose, Bld: 106 mg/dL — ABNORMAL HIGH (ref 70–99)
Potassium: 3.3 mmol/L — ABNORMAL LOW (ref 3.5–5.1)
Sodium: 137 mmol/L (ref 135–145)

## 2023-08-30 MED ORDER — MAGNESIUM SULFATE 2 GM/50ML IV SOLN
2.0000 g | Freq: Once | INTRAVENOUS | Status: AC
Start: 2023-08-30 — End: 2023-08-30
  Administered 2023-08-30: 2 g via INTRAVENOUS

## 2023-08-30 MED ORDER — SODIUM CHLORIDE 0.9 % IV SOLN
30.0000 mg/m2 | Freq: Once | INTRAVENOUS | Status: AC
  Administered 2023-08-30: 59 mg via INTRAVENOUS
  Filled 2023-08-30: qty 59

## 2023-08-30 MED ORDER — MAGNESIUM SULFATE 2 GM/50ML IV SOLN
2.0000 g | Freq: Once | INTRAVENOUS | Status: AC
  Administered 2023-08-30: 2 g via INTRAVENOUS

## 2023-08-30 MED ORDER — SODIUM CHLORIDE 0.9 % IV SOLN
INTRAVENOUS | Status: DC
  Filled 2023-08-30: qty 250

## 2023-08-30 MED ORDER — HEPARIN SOD (PORK) LOCK FLUSH 100 UNIT/ML IV SOLN
500.0000 [IU] | Freq: Once | INTRAVENOUS | Status: AC | PRN
Start: 2023-08-30 — End: 2023-08-30
  Administered 2023-08-30: 500 [IU]
  Filled 2023-08-30: qty 5

## 2023-08-30 MED ORDER — FOSAPREPITANT DIMEGLUMINE INJECTION 150 MG
150.0000 mg | Freq: Once | INTRAVENOUS | Status: AC
  Administered 2023-08-30: 150 mg via INTRAVENOUS
  Filled 2023-08-30: qty 5
  Filled 2023-08-30: qty 150

## 2023-08-30 MED ORDER — POTASSIUM CHLORIDE IN NACL 20-0.9 MEQ/L-% IV SOLN: Freq: Once | INTRAVENOUS | Status: AC

## 2023-08-30 MED ORDER — DEXAMETHASONE SODIUM PHOSPHATE 10 MG/ML IJ SOLN
10.0000 mg | Freq: Once | INTRAMUSCULAR | Status: AC
  Administered 2023-08-30: 10 mg via INTRAVENOUS
  Filled 2023-08-30: qty 1

## 2023-08-30 MED ORDER — POTASSIUM CHLORIDE 10 MEQ/100ML IV SOLN
10.0000 meq | Freq: Once | INTRAVENOUS | Status: AC
  Administered 2023-08-30: 10 meq via INTRAVENOUS
  Filled 2023-08-30: qty 100

## 2023-08-30 MED ORDER — PALONOSETRON HCL INJECTION 0.25 MG/5ML
0.2500 mg | Freq: Once | INTRAVENOUS | Status: AC
  Administered 2023-08-30: 0.25 mg via INTRAVENOUS
  Filled 2023-08-30: qty 5

## 2023-08-30 NOTE — Progress Notes (Signed)
 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 He was ultimately referred By his dentist to ENT and underwent CT soft tissue neck which showed a 4 cm partially necrotic mass at the glossotonsillar sulcus on the right side and to level 2 lymph nodes measuring 15 and 14 mm that appeared pathologic. Core needle biopsy of the level 2 lymph node was consistent with squamous cell carcinoma. Cells diffusely positive for p40 and p16. Plan is to proceed with concurrent weekly cisplatin  chemotherapy with radiation   Interval history-patient did not receive chemotherapy last week and feels better after receiving IV fluids and short course of steroids.  Swallowing is gradually improving.  Pain is presently well-controlled on fentanyl  patch and as needed oxycodone   ECOG PS- 1 Pain scale- 2 Opioid associated constipation- no  Review of systems- Review of Systems  Constitutional:  Positive for malaise/fatigue. Negative for chills, fever and weight loss.  HENT:  Negative for congestion, ear discharge and nosebleeds.   Eyes:  Negative for blurred vision.  Respiratory:  Negative for cough, hemoptysis, sputum production, shortness of breath and wheezing.   Cardiovascular:  Negative for chest pain, palpitations, orthopnea and claudication.   Gastrointestinal:  Negative for abdominal pain, blood in stool, constipation, diarrhea, heartburn, melena, nausea and vomiting.  Genitourinary:  Negative for dysuria, flank pain, frequency, hematuria and urgency.  Musculoskeletal:  Negative for back pain, joint pain and myalgias.  Skin:  Negative for rash.  Neurological:  Negative for dizziness, tingling, focal weakness, seizures, weakness and headaches.  Endo/Heme/Allergies:  Does not bruise/bleed easily.  Psychiatric/Behavioral:  Negative for depression and suicidal ideas. The patient does not have insomnia.       Allergies  Allergen Reactions   Mirtazapine Rash   Other Itching and Rash    ETCO2 tubing. Medtronic Microstream advance     Past Medical History:  Diagnosis Date   Anxiety    Arthritis    Carpal tunnel syndrome    Depression    Diabetes (HCC)    Hyperlipidemia    Hypertension    Loss of teeth due to extraction    Neoplasm related pain    Parkinson's disease [facility])      Past Surgical History:  Procedure Laterality Date   CARPAL TUNNEL RELEASE Right 11/28/2020   Procedure: CARPAL TUNNEL RELEASE ENDOSCOPIC;  Surgeon: Elner Hahn, MD;  Location: [facility] ORS;  Service: Orthopedics;  Laterality: Right;   CARPAL TUNNEL RELEASE Left 01/22/2021   Procedure: CARPAL TUNNEL RELEASE ENDOSCOPIC;  Surgeon: Elner Hahn, MD;  Location: [facility] ORS;  Service: Orthopedics;  Laterality: Left;   IR IMAGING GUIDED PORT INSERTION  06/30/2023   KNEE ARTHROSCOPY Right     Social History   Socioeconomic History   Marital status: Single    Spouse name: [name]   Number of children: Not on file   Years of  education: Not on file   Highest education level: Not on file  Occupational History   Not on file  Tobacco Use   Smoking status: Never   Smokeless tobacco: Never  Vaping Use   Vaping status: Never Used  Substance and Sexual Activity   Alcohol use: Not Currently   Drug use: Never   Sexual activity: Not Currently    Birth  control/protection: None  Other Topics Concern   Not on file  Social History Narrative   Lives with friend   Social Drivers of Health   Financial Resource Strain: Not on file  Food Insecurity: No Food Insecurity (06/22/2023)   Hunger Vital Sign    Worried About Running Out of Food in the Last Year: Never true    Ran Out of Food in the Last Year: Never true  Transportation Needs: No Transportation Needs (06/22/2023)   PRAPARE - Administrator, Civil Service (Medical): No    Lack of Transportation (Non-Medical): No  Physical Activity: Not on file  Stress: Not on file  Social Connections: Not on file  Intimate Partner Violence: Not At Risk (06/22/2023)   Humiliation, Afraid, Rape, and Kick questionnaire    Fear of Current or Ex-Partner: No    Emotionally Abused: No    Physically Abused: No    Sexually Abused: No    Family History  Problem Relation Age of Onset   Diabetes Mother    Hypertension Mother    CVA Father    Heart attack Father    Depression Sister    Depression Brother      Current Outpatient Medications:    ACCU-CHEK GUIDE TEST test strip, TEST 2X DAILY, Disp: , Rfl:    Accu-Chek Softclix Lancets lancets, 2 (two) times daily., Disp: , Rfl:    ARIPiprazole  (ABILIFY ) 30 MG tablet, Take 30 mg by mouth daily., Disp: , Rfl:    buPROPion  (WELLBUTRIN  XL) 150 MG 24 hr tablet, Take 1 tablet (150 mg total) by mouth daily. Total of 450 mg daily. Take along with 300 mg tab, Disp: 30 tablet, Rfl: 5   buPROPion  (WELLBUTRIN  XL) 150 MG 24 hr tablet, Take by mouth., Disp: , Rfl:    buPROPion  (WELLBUTRIN  XL) 300 MG 24 hr tablet, Take 1 tablet (300 mg total) by mouth daily. Total of 450 mg daily. Take along with 150 mg tab, Disp: 30 tablet, Rfl: 5   calcium  carbonate (CALCIUM  600) 600 MG TABS tablet, Take 1 tablet (600 mg total) by mouth daily. For low calcium , Disp: 60 tablet, Rfl: 0   magnesium  chloride (SLOW-MAG) 64 MG TBEC SR tablet, Take 2 tablets (128 mg total) by  mouth daily. For low magnesium , Disp: 120 tablet, Rfl: 00   metFORMIN  (GLUCOPHAGE ) 500 MG tablet, Take 500 mg by mouth daily with breakfast., Disp: , Rfl:    ondansetron  (ZOFRAN ) 8 MG tablet, Take 1 tablet (8 mg total) by mouth every 8 (eight) hours as needed for nausea or vomiting. Start on the third day after cisplatin ., Disp: 30 tablet, Rfl: 1   Oxycodone  HCl 10 MG TABS, Take 1 tablet (10 mg total) by mouth every 4 (four) hours as needed., Disp: 90 tablet, Rfl: 0   prochlorperazine  (COMPAZINE ) 10 MG tablet, Take 1 tablet (10 mg total) by mouth every 6 (six) hours as needed (Nausea or vomiting)., Disp: 30 tablet, Rfl: 1   rosuvastatin  (CRESTOR ) 20 MG tablet, Take 20 mg by mouth daily., Disp: , Rfl:    [START ON  09/04/2023] sertraline  (ZOLOFT ) 100 MG tablet, Take 1.5 tablets (150 mg total) by mouth at bedtime., Disp: 45 tablet, Rfl: 3   tamsulosin (FLOMAX) 0.4 MG CAPS capsule, Take by mouth., Disp: , Rfl:    traZODone  (DESYREL ) 50 MG tablet, Take 0.5-2 tablets (25-100 mg total) by mouth at bedtime as needed for sleep., Disp: 60 tablet, Rfl: 1   atenolol (TENORMIN) 50 MG tablet, Take 50 mg by mouth daily., Disp: , Rfl:    Blood Glucose Monitoring Suppl (ACCU-CHEK GUIDE ME) w/Device KIT, See admin instructions., Disp: , Rfl:    busPIRone  (BUSPAR ) 7.5 MG tablet, Take 1 tablet (7.5 mg total) by mouth 2 (two) times daily., Disp: 60 tablet, Rfl: 3   carbidopa -levodopa  (SINEMET  IR) 25-100 MG tablet, Take 1 tablet by mouth 5 (five) times daily. 3 in morning 2 in evening, Disp: , Rfl:    [START ON 09/09/2023] clonazePAM  (KLONOPIN ) 1 MG tablet, Take 1 tablet (1 mg total) by mouth 3 (three) times daily as needed for anxiety., Disp: 90 tablet, Rfl: 0   dexamethasone  (DECADRON ) 2 MG tablet, Take 1 tablet by mouth daily x 1 week, then take 1/2 tablet daily x 1 week. Do not abruptly stop., Disp: 14 tablet, Rfl: 1   dexamethasone  (DECADRON ) 2 MG tablet, Take 1 tablet (2 mg total) by mouth daily., Disp: 5 tablet, Rfl:  0   fentaNYL  (DURAGESIC ) 25 MCG/HR, Place 1 patch onto the skin every 3 (three) days., Disp: 10 patch, Rfl: 0   folic acid  (FOLVITE ) 1 MG tablet, Take 1 tablet (1 mg total) by mouth daily., Disp: 30 tablet, Rfl: 3   ketoconazole (NIZORAL) 2 % shampoo, Apply 1 application  topically every other day., Disp: , Rfl:    lidocaine -prilocaine  (EMLA ) cream, Apply to affected area once, Disp: 30 g, Rfl: 3   magic mouthwash (multi-ingredient) oral suspension, Swish and swallow 5-10 mLs by mouth 4 (four) times daily., Disp: 480 mL, Rfl: 3   magic mouthwash w/lidocaine  SOLN, Take 5-10 mLs by mouth 4 (four) times daily as needed for mouth pain. Suspension contains equal amounts of Maalox Extra Strength, nystatin , diphenhydramine and lidocaine . (Patient not taking: Reported on 08/30/2023), Disp: 480 mL, Rfl: 3   sildenafil (VIAGRA) 50 MG tablet, Take 50 mg by mouth daily as needed for erectile dysfunction. (Patient not taking: Reported on 08/30/2023), Disp: , Rfl:    sucralfate  (CARAFATE ) 1 g tablet, Take 1 tablet (1 g total) by mouth 3 (three) times daily before meals. Dissolve in 4 tbs of warm water , swish and swallow, Disp: 90 tablet, Rfl: 1   tadalafil (CIALIS) 5 MG tablet, Take 5 mg by mouth daily. (Patient not taking: Reported on 08/30/2023), Disp: , Rfl:  No current facility-administered medications for this visit.  Facility-Administered Medications Ordered in Other Visits:    0.9 %  sodium chloride  infusion, , Intravenous, Continuous, Seretha Dance C, MD   0.9 % NaCl with KCl 20 mEq/ L  infusion, , Intravenous, Once, Avonne Boettcher, MD   CISplatin  (PLATINOL ) 59 mg in sodium chloride  0.9 % 250 mL chemo infusion, 30 mg/m2 (Treatment Plan Recorded), Intravenous, Once, Avonne Boettcher, MD   dexamethasone  (DECADRON ) injection 10 mg, 10 mg, Intravenous, Once, Avonne Boettcher, MD   fosaprepitant  (EMEND) 150 mg in sodium chloride  0.9 % 145 mL IVPB, 150 mg, Intravenous, Once, Avonne Boettcher, MD   heparin  lock flush 100  unit/mL, 500 Units, Intracatheter, Once PRN, Rhyse Skowron C, MD   magnesium  sulfate IVPB 2 g  50 mL, 2 g, Intravenous, Once, Avonne Boettcher, MD   palonosetron  (ALOXI ) injection 0.25 mg, 0.25 mg, Intravenous, Once, Avonne Boettcher, MD  Physical exam:  Vitals:   08/30/23 0836  BP: (!) 93/59  Pulse: 66  Resp: 16  Temp: (!) 97.3 F (36.3 C)  SpO2: 100%  Weight: 161 lb 8 oz (73.3 kg)   Physical Exam Neck:     Comments: No palpable cervical adenopathy Cardiovascular:     Rate and Rhythm: Normal rate and regular rhythm.     Heart sounds: Normal heart sounds.  Pulmonary:     Effort: Pulmonary effort is normal.     Breath sounds: Normal breath sounds.  Skin:    General: Skin is warm and dry.  Neurological:     Mental Status: He is alert and oriented to person, place, and time.      I have personally reviewed labs listed below:    Latest Ref Rng & Units 08/30/2023    8:02 AM  CMP  Glucose 70 - 99 mg/dL 829   BUN 8 - 23 mg/dL 13   Creatinine 5.62 - 1.24 mg/dL 1.30   Sodium 865 - 784 mmol/L 137   Potassium 3.5 - 5.1 mmol/L 3.3   Chloride 98 - 111 mmol/L 101   CO2 22 - 32 mmol/L 26   Calcium  8.9 - 10.3 mg/dL 8.2       Latest Ref Rng & Units 08/30/2023    8:02 AM  CBC  WBC 4.0 - 10.5 K/uL 2.3   Hemoglobin 13.0 - 17.0 g/dL 8.6   Hematocrit 69.6 - 52.0 % 25.7   Platelets 150 - 400 K/uL 67    I have personally reviewed Radiology images listed below: No images are attached to the encounter.  No results found.   Assessment and plan- Patient is a 68 y.o. 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  He is here for on treatment assessment prior to cycle 6 of weekly cisplatin  chemotherapy  Despite giving him a break from chemotherapy last week platelets are 67 today.  He completes radiation therapy next week.  I would therefore like him to get at least 1 additional cycle of chemotherapy so he gets 6 out of 7 treatments.  I am therefore  proceeding with cycle 6 of chemotherapy at a reduced dose of cisplatin  30 mg/m.  He may not be able to get cycle 7 next week if platelet counts are less than 50 and ANC less than 1.  Patient will be receiving 1 L of IV fluids today with 20 mEq of IV potassium and magnesium  in addition to pre and post cisplatin  fluids.  Chemo induced anemia: He has a component of folate deficiency and B12 deficiency for which he is on oral supplements.  He will also be getting B12 injection today.  Continue to monitor  I will see him back in 3 weeks with repeat labs.  Plan to repeat PET scan about 10 weeks after completing chemoradiation  Neoplasm related pain: Continue fentanyl  patch and as needed oxycodone    Visit Diagnosis 1. Squamous cell carcinoma of oropharynx (HCC)   2. Encounter for antineoplastic chemotherapy   3. Antineoplastic chemotherapy induced anemia   4. Hypokalemia   5. B12 deficiency   6. Folate deficiency      Dr. Seretha Dance, MD, MPH [facility] [phone] 08/30/2023 9:10 AM

## 2023-08-30 NOTE — Patient Instructions (Signed)
 [facility] - A DEPT OF [facility]  Discharge Instructions: Thank you for choosing [facility] to provide your oncology and hematology care.  If you have a lab appointment with the Cancer Center, please go directly to the Cancer Center and check in at the registration area.  Wear comfortable clothing and clothing appropriate for easy access to any Portacath or PICC line.   We strive to give you quality time with your provider. You may need to reschedule your appointment if you arrive late (15 or more minutes).  Arriving late affects you and other patients whose appointments are after yours.  Also, if you miss three or more appointments without notifying the office, you may be dismissed from the clinic at the provider's discretion.      For prescription refill requests, have your pharmacy contact our office and allow 72 hours for refills to be completed.    Today you received the following chemotherapy and/or immunotherapy agents CISPLATIN , MAGNESIUM  and POTASSIUM      To help prevent nausea and vomiting after your treatment, we encourage you to take your nausea medication as directed.  BELOW ARE SYMPTOMS THAT SHOULD BE REPORTED IMMEDIATELY: *FEVER GREATER THAN 100.4 F (38 C) OR HIGHER *CHILLS OR SWEATING *NAUSEA AND VOMITING THAT IS NOT CONTROLLED WITH YOUR NAUSEA MEDICATION *UNUSUAL SHORTNESS OF BREATH *UNUSUAL BRUISING OR BLEEDING *URINARY PROBLEMS (pain or burning when urinating, or frequent urination) *BOWEL PROBLEMS (unusual diarrhea, constipation, pain near the anus) TENDERNESS IN MOUTH AND THROAT WITH OR WITHOUT PRESENCE OF ULCERS (sore throat, sores in mouth, or a toothache) UNUSUAL RASH, SWELLING OR PAIN  UNUSUAL VAGINAL DISCHARGE OR ITCHING   Items with * indicate a potential emergency and should be followed up as soon as possible or go to the Emergency Department if any problems should occur.  Please show the CHEMOTHERAPY ALERT  CARD or IMMUNOTHERAPY ALERT CARD at check-in to the Emergency Department and triage nurse.  Should you have questions after your visit or need to cancel or reschedule your appointment, please contact [facility] - A DEPT OF [facility]  [phone] and follow the prompts.  Office hours are 8:00 a.m. to 4:30 p.m. Monday - Friday. Please note that voicemails left after 4:00 p.m. may not be returned until the following business day.  We are closed weekends and major holidays. You have access to a nurse at all times for urgent questions. Please call the main number to the clinic [phone] and follow the prompts.  For any non-urgent questions, you may also contact your provider using MyChart. We now offer e-Visits for anyone 45 and older to request care online for non-urgent symptoms. For details visit [email or web address].   Also download the MyChart app! Go to the app store, search "MyChart", open the app, select [facility], and log in with your MyChart username and password.  Cisplatin  Injection What is this medication? CISPLATIN  (SIS pla tin) treats some types of cancer. It works by slowing down the growth of cancer cells. This medicine may be used for other purposes; ask your health care provider or pharmacist if you have questions. COMMON BRAND NAME(S): Platinol , Platinol  -AQ What should I tell my care team before I take this medication? They need to know if you have any of these conditions: Eye disease, vision problems Hearing problems Kidney disease Low blood counts, such as low white cells, platelets, or red blood cells Tingling  of the fingers or toes, or other nerve disorder An unusual or allergic reaction to cisplatin , carboplatin, oxaliplatin, other medications, foods, dyes, or preservatives If you or your partner are pregnant or trying to get pregnant Breast-feeding How should I use this medication? This medication is injected into a vein. It  is given by your care team in a hospital or clinic setting. Talk to your care team about the use of this medication in children. Special care may be needed. Overdosage: If you think you have taken too much of this medicine contact a poison control center or emergency room at once. NOTE: This medicine is only for you. Do not share this medicine with others. What if I miss a dose? Keep appointments for follow-up doses. It is important not to miss your dose. Call your care team if you are unable to keep an appointment. What may interact with this medication? Do not take this medication with any of the following: Live virus vaccines This medication may also interact with the following: Certain antibiotics, such as amikacin, gentamicin, neomycin, polymyxin B, streptomycin, tobramycin, vancomycin Foscarnet This list may not describe all possible interactions. Give your health care provider a list of all the medicines, herbs, non-prescription drugs, or dietary supplements you use. Also tell them if you smoke, drink alcohol, or use illegal drugs. Some items may interact with your medicine. What should I watch for while using this medication? Your condition will be monitored carefully while you are receiving this medication. You may need blood work done while taking this medication. This medication may make you feel generally unwell. This is not uncommon, as chemotherapy can affect healthy cells as well as cancer cells. Report any side effects. Continue your course of treatment even though you feel ill unless your care team tells you to stop. This medication may increase your risk of getting an infection. Call your care team for advice if you get a fever, chills, sore throat, or other symptoms of a cold or flu. Do not treat yourself. Try to avoid being around people who are sick. Avoid taking medications that contain aspirin, acetaminophen , ibuprofen, naproxen, or ketoprofen unless instructed by your care  team. These medications may hide a fever. This medication may increase your risk to bruise or bleed. Call your care team if you notice any unusual bleeding. Be careful brushing or flossing your teeth or using a toothpick because you may get an infection or bleed more easily. If you have any dental work done, tell your dentist you are receiving this medication. Drink fluids as directed while you are taking this medication. This will help protect your kidneys. Call your care team if you get diarrhea. Do not treat yourself. Talk to your care team if you or your partner wish to become pregnant or think you might be pregnant. This medication can cause serious birth defects if taken during pregnancy and for 14 months after the last dose. A negative pregnancy test is required before starting this medication. A reliable form of contraception is recommended while taking this medication and for 14 months after the last dose. Talk to your care team about effective forms of contraception. Do not father a child while taking this medication and for 11 months after the last dose. Use a condom during sex during this time period. Do not breast-feed while taking this medication. This medication may cause infertility. Talk to your care team if you are concerned about your fertility. What side effects may I notice from receiving  this medication? Side effects that you should report to your care team as soon as possible: Allergic reactions--skin rash, itching, hives, swelling of the face, lips, tongue, or throat Eye pain, change in vision, vision loss Hearing loss, ringing in ears Infection--fever, chills, cough, sore throat, wounds that don't heal, pain or trouble when passing urine, general feeling of discomfort or being unwell Kidney injury--decrease in the amount of urine, swelling of the ankles, hands, or feet Low red blood cell level--unusual weakness or fatigue, dizziness, headache, trouble breathing Painful  swelling, warmth, or redness of the skin, blisters or sores at the infusion site Pain, tingling, or numbness in the hands or feet Unusual bruising or bleeding Side effects that usually do not require medical attention (report to your care team if they continue or are bothersome): Hair loss Nausea Vomiting This list may not describe all possible side effects. Call your doctor for medical advice about side effects. You may report side effects to FDA at 1-800-FDA-1088. Where should I keep my medication? This medication is given in a hospital or clinic. It will not be stored at home. NOTE: This sheet is a summary. It may not cover all possible information. If you have questions about this medicine, talk to your doctor, pharmacist, or health care provider.  2024 Elsevier/Gold Standard (2021-08-29 00:00:00)  Magnesium  Sulfate Injection What is this medication? MAGNESIUM  SULFATE (mag NEE zee um SUL fate) prevents and treats low levels of magnesium  in your body. It may also be used to prevent and treat seizures during pregnancy in people with high blood pressure disorders, such as preeclampsia or eclampsia. Magnesium  plays an important role in maintaining the health of your muscles and nervous system. This medicine may be used for other purposes; ask your health care provider or pharmacist if you have questions. What should I tell my care team before I take this medication? They need to know if you have any of these conditions: Heart disease History of irregular heart beat Kidney disease An unusual or allergic reaction to magnesium  sulfate, medications, foods, dyes, or preservatives Pregnant or trying to get pregnant Breast-feeding How should I use this medication? This medication is for infusion into a vein. It is given in a hospital or clinic setting. Talk to your care team about the use of this medication in children. While this medication may be prescribed for selected conditions, precautions  do apply. Overdosage: If you think you have taken too much of this medicine contact a poison control center or emergency room at once. NOTE: This medicine is only for you. Do not share this medicine with others. What if I miss a dose? This does not apply. What may interact with this medication? Certain medications for anxiety or sleep Certain medications for seizures, such phenobarbital Digoxin Medications that relax muscles for surgery Narcotic medications for pain This list may not describe all possible interactions. Give your health care provider a list of all the medicines, herbs, non-prescription drugs, or dietary supplements you use. Also tell them if you smoke, drink alcohol, or use illegal drugs. Some items may interact with your medicine. What should I watch for while using this medication? Your condition will be monitored carefully while you are receiving this medication. You may need blood work done while you are receiving this medication. What side effects may I notice from receiving this medication? Side effects that you should report to your care team as soon as possible: Allergic reactions--skin rash, itching, hives, swelling of the face, lips, tongue,  or throat High magnesium  level--confusion, drowsiness, facial flushing, redness, sweating, muscle weakness, fast or irregular heartbeat, trouble breathing Low blood pressure--dizziness, feeling faint or lightheaded, blurry vision Side effects that usually do not require medical attention (report to your care team if they continue or are bothersome): Headache Nausea This list may not describe all possible side effects. Call your doctor for medical advice about side effects. You may report side effects to FDA at 1-800-FDA-1088. Where should I keep my medication? This medication is given in a hospital or clinic and will not be stored at home. NOTE: This sheet is a summary. It may not cover all possible information. If you have  questions about this medicine, talk to your doctor, pharmacist, or health care provider.  2024 Elsevier/Gold Standard (2021-01-08 00:00:00)  Potassium Chloride  Injection What is this medication? POTASSIUM CHLORIDE  (poe TASS i um KLOOR ide) prevents and treats low levels of potassium in your body. Potassium plays an important role in maintaining the health of your kidneys, heart, muscles, and nervous system. This medicine may be used for other purposes; ask your health care provider or pharmacist if you have questions. COMMON BRAND NAME(S): PROAMP What should I tell my care team before I take this medication? They need to know if you have any of these conditions: Addison disease Dehydration Diabetes (high blood sugar) Heart disease High levels of potassium in the blood Irregular heartbeat or rhythm Kidney disease Large areas of burned skin An unusual or allergic reaction to potassium, other medications, foods, dyes, or preservatives Pregnant or trying to get pregnant Breast-feeding How should I use this medication? This medication is injected into a vein. It is given in a hospital or clinic setting. Talk to your care team about the use of this medication in children. Special care may be needed. Overdosage: If you think you have taken too much of this medicine contact a poison control center or emergency room at once. NOTE: This medicine is only for you. Do not share this medicine with others. What if I miss a dose? This does not apply. This medication is not for regular use. What may interact with this medication? Do not take this medication with any of the following: Certain diuretics, such as spironolactone, triamterene Eplerenone Sodium polystyrene sulfonate This medication may also interact with the following: Certain medications for blood pressure or heart disease, such as lisinopril, losartan, quinapril, valsartan Medications that lower your chance of fighting infection, such  as cyclosporine, tacrolimus NSAIDs, medications for pain and inflammation, such as ibuprofen or naproxen Other potassium supplements Salt substitutes This list may not describe all possible interactions. Give your health care provider a list of all the medicines, herbs, non-prescription drugs, or dietary supplements you use. Also tell them if you smoke, drink alcohol, or use illegal drugs. Some items may interact with your medicine. What should I watch for while using this medication? Visit your care team for regular checks on your progress. Tell your care team if your symptoms do not start to get better or if they get worse. You may need blood work while you are taking this medication. Avoid salt substitutes unless you are told otherwise by your care team. What side effects may I notice from receiving this medication? Side effects that you should report to your care team as soon as possible: Allergic reactions--skin rash, itching, hives, swelling of the face, lips, tongue, or throat High potassium level--muscle weakness, fast or irregular heartbeat Side effects that usually do not require medical attention (  report to your care team if they continue or are bothersome): Diarrhea Nausea Stomach pain Vomiting This list may not describe all possible side effects. Call your doctor for medical advice about side effects. You may report side effects to FDA at 1-800-FDA-1088. Where should I keep my medication? This medication is given in a hospital or clinic. It will not be stored at home. NOTE: This sheet is a summary. It may not cover all possible information. If you have questions about this medicine, talk to your doctor, pharmacist, or health care provider.  2024 Elsevier/Gold Standard (2021-11-07 00:00:00)

## 2023-08-31 ENCOUNTER — Ambulatory Visit

## 2023-08-31 NOTE — Addendum Note (Signed)
 Encounter addended by: Karolynn Pack on: 08/31/2023 9:22 AM  Actions taken: Imaging Exam ended

## 2023-09-01 ENCOUNTER — Ambulatory Visit
Admission: RE | Admit: 2023-09-01 | Discharge: 2023-09-01 | Disposition: A | Discharge status: Home / Self Care | Source: Ambulatory Visit | Attending: Radiation Oncology | Admitting: Radiation Oncology

## 2023-09-01 ENCOUNTER — Ambulatory Visit

## 2023-09-02 ENCOUNTER — Ambulatory Visit

## 2023-09-02 ENCOUNTER — Ambulatory Visit: Admission: RE | Admit: 2023-09-02 | Source: Ambulatory Visit

## 2023-09-02 ENCOUNTER — Inpatient Hospital Stay

## 2023-09-02 DIAGNOSIS — Z5111 Encounter for antineoplastic chemotherapy: Secondary | ICD-10-CM | POA: Diagnosis not present

## 2023-09-02 DIAGNOSIS — C109 Malignant neoplasm of oropharynx, unspecified: Secondary | ICD-10-CM

## 2023-09-02 LAB — CBC (CANCER CENTER ONLY)
HCT: 25.9 % — ABNORMAL LOW (ref 39.0–52.0)
Hemoglobin: 8.9 g/dL — ABNORMAL LOW (ref 13.0–17.0)
MCH: 30.9 pg (ref 26.0–34.0)
MCHC: 34.4 g/dL (ref 30.0–36.0)
MCV: 89.9 fL (ref 80.0–100.0)
Platelet Count: 57 10*3/uL — ABNORMAL LOW (ref 150–400)
RBC: 2.88 MIL/uL — ABNORMAL LOW (ref 4.22–5.81)
RDW: 17.5 % — ABNORMAL HIGH (ref 11.5–15.5)
WBC Count: 2 10*3/uL — ABNORMAL LOW (ref 4.0–10.5)
nRBC: 0 % (ref 0.0–0.2)

## 2023-09-03 ENCOUNTER — Ambulatory Visit

## 2023-09-03 MED FILL — Fosaprepitant Dimeglumine For IV Infusion 150 MG (Base Eq): INTRAVENOUS | Qty: 5 | Status: AC

## 2023-09-06 ENCOUNTER — Ambulatory Visit
Admission: RE | Admit: 2023-09-06 | Discharge: 2023-09-06 | Disposition: A | Discharge status: Home / Self Care | Source: Ambulatory Visit | Attending: Radiation Oncology | Admitting: Radiation Oncology

## 2023-09-06 ENCOUNTER — Other Ambulatory Visit: Payer: Self-pay

## 2023-09-06 ENCOUNTER — Inpatient Hospital Stay

## 2023-09-06 ENCOUNTER — Encounter: Payer: Self-pay | Admitting: Oncology

## 2023-09-06 ENCOUNTER — Other Ambulatory Visit: Payer: Self-pay | Admitting: Oncology

## 2023-09-06 ENCOUNTER — Ambulatory Visit

## 2023-09-06 VITALS — BP 120/61 | HR 72

## 2023-09-06 DIAGNOSIS — Z5111 Encounter for antineoplastic chemotherapy: Secondary | ICD-10-CM | POA: Diagnosis not present

## 2023-09-06 DIAGNOSIS — E860 Dehydration: Secondary | ICD-10-CM

## 2023-09-06 DIAGNOSIS — C109 Malignant neoplasm of oropharynx, unspecified: Secondary | ICD-10-CM

## 2023-09-06 DIAGNOSIS — E612 Magnesium deficiency: Secondary | ICD-10-CM

## 2023-09-06 DIAGNOSIS — Z510 Encounter for antineoplastic radiation therapy: Secondary | ICD-10-CM | POA: Diagnosis not present

## 2023-09-06 LAB — RAD ONC ARIA SESSION SUMMARY
Course Elapsed Days: 56
Plan Fractions Treated to Date: 27
Plan Prescribed Dose Per Fraction: 2 Gy
Plan Total Fractions Prescribed: 35
Plan Total Prescribed Dose: 70 Gy
Reference Point Dosage Given to Date: 54 Gy
Reference Point Session Dosage Given: 2 Gy
Session Number: 27

## 2023-09-06 LAB — CBC WITH DIFFERENTIAL (CANCER CENTER ONLY)
Abs Immature Granulocytes: 0 10*3/uL (ref 0.00–0.07)
Basophils Absolute: 0 10*3/uL (ref 0.0–0.1)
Basophils Relative: 1 %
Eosinophils Absolute: 0 10*3/uL (ref 0.0–0.5)
Eosinophils Relative: 1 %
HCT: 24.1 % — ABNORMAL LOW (ref 39.0–52.0)
Hemoglobin: 8.5 g/dL — ABNORMAL LOW (ref 13.0–17.0)
Immature Granulocytes: 0 %
Lymphocytes Relative: 13 %
Lymphs Abs: 0.2 10*3/uL — ABNORMAL LOW (ref 0.7–4.0)
MCH: 30.8 pg (ref 26.0–34.0)
MCHC: 35.3 g/dL (ref 30.0–36.0)
MCV: 87.3 fL (ref 80.0–100.0)
Monocytes Absolute: 0.1 10*3/uL (ref 0.1–1.0)
Monocytes Relative: 7 %
Neutro Abs: 1.4 10*3/uL — ABNORMAL LOW (ref 1.7–7.7)
Neutrophils Relative %: 78 %
Platelet Count: 69 10*3/uL — ABNORMAL LOW (ref 150–400)
RBC: 2.76 MIL/uL — ABNORMAL LOW (ref 4.22–5.81)
RDW: 16.7 % — ABNORMAL HIGH (ref 11.5–15.5)
WBC Count: 1.8 10*3/uL — ABNORMAL LOW (ref 4.0–10.5)
nRBC: 0 % (ref 0.0–0.2)

## 2023-09-06 LAB — BASIC METABOLIC PANEL - CANCER CENTER ONLY
Anion gap: 8 (ref 5–15)
BUN: 19 mg/dL (ref 8–23)
CO2: 26 mmol/L (ref 22–32)
Calcium: 8.3 mg/dL — ABNORMAL LOW (ref 8.9–10.3)
Chloride: 100 mmol/L (ref 98–111)
Creatinine: 1.32 mg/dL — ABNORMAL HIGH (ref 0.61–1.24)
GFR, Estimated: 60 mL/min — ABNORMAL LOW (ref >60)
Glucose, Bld: 152 mg/dL — ABNORMAL HIGH (ref 70–99)
Potassium: 3.7 mmol/L (ref 3.5–5.1)
Sodium: 134 mmol/L — ABNORMAL LOW (ref 135–145)

## 2023-09-06 LAB — MAGNESIUM: Magnesium: 1.2 mg/dL — ABNORMAL LOW (ref 1.7–2.4)

## 2023-09-06 MED ORDER — HEPARIN SOD (PORK) LOCK FLUSH 100 UNIT/ML IV SOLN
500.0000 [IU] | Freq: Once | INTRAVENOUS | Status: AC
  Administered 2023-09-06: 500 [IU] via INTRAVENOUS
  Filled 2023-09-06: qty 5

## 2023-09-06 MED ORDER — SODIUM CHLORIDE 0.9 % IV SOLN
INTRAVENOUS | Status: DC
  Filled 2023-09-06 (×2): qty 250

## 2023-09-06 MED ORDER — MAGNESIUM SULFATE 2 GM/50ML IV SOLN
2.0000 g | Freq: Once | INTRAVENOUS | Status: AC
  Administered 2023-09-06: 2 g via INTRAVENOUS
  Filled 2023-09-06: qty 50

## 2023-09-07 ENCOUNTER — Inpatient Hospital Stay

## 2023-09-07 ENCOUNTER — Other Ambulatory Visit: Payer: Self-pay

## 2023-09-07 ENCOUNTER — Ambulatory Visit
Admission: RE | Admit: 2023-09-07 | Discharge: 2023-09-07 | Disposition: A | Discharge status: Home / Self Care | Source: Ambulatory Visit | Attending: Radiation Oncology | Admitting: Radiation Oncology

## 2023-09-07 DIAGNOSIS — Z510 Encounter for antineoplastic radiation therapy: Secondary | ICD-10-CM | POA: Diagnosis not present

## 2023-09-07 LAB — RAD ONC ARIA SESSION SUMMARY
Course Elapsed Days: 57
Plan Fractions Treated to Date: 28
Plan Prescribed Dose Per Fraction: 2 Gy
Plan Total Fractions Prescribed: 35
Plan Total Prescribed Dose: 70 Gy
Reference Point Dosage Given to Date: 56 Gy
Reference Point Session Dosage Given: 2 Gy
Session Number: 28

## 2023-09-07 NOTE — Progress Notes (Signed)
 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  Patient receiving chemotherapy and radiation.  Last day for radiation planned on 5/8.   Met with patient and significant other [name] after radiation.  Patient received reduced dose of chemotherapy on 4/21.  Reports that he has been able to eat scrambled eggs with bologna, beef tips with potatoes, eye of round, butter peas, chicken/tuna salad, Frosty, milkshake.  Says that taste is off and makes it hard for him to keep eating.      Medications: reviewed  Labs: reviewed  Anthropometrics:   Weight 158 lb 2.9 oz  157 lb on 4/15 162 lb 7.7 oz on 4/4 165 lb 4.8 oz on 3/17 169 lb on 3/7 177 lb on 2/13    NUTRITION DIAGNOSIS: Inadequate oral intake continues    INTERVENTION:  Declined PEG tube Continue eating q 2 hours of high calorie, high protein soft foods Continue oral nutrition supplements as able     MONITORING, EVALUATION, GOAL: weight trends, intake   NEXT VISIT: phone call on Tuesday, May 13  Romey Cohea B. Zollie Hipp, CSO, LDN Registered Dietitian [phone]

## 2023-09-08 ENCOUNTER — Ambulatory Visit (INDEPENDENT_AMBULATORY_CARE_PROVIDER_SITE_OTHER): Payer: Self-pay | Admitting: Licensed Clinical Social Worker

## 2023-09-08 ENCOUNTER — Ambulatory Visit
Admission: RE | Admit: 2023-09-08 | Discharge: 2023-09-08 | Disposition: A | Discharge status: Home / Self Care | Source: Ambulatory Visit | Attending: Radiation Oncology | Admitting: Radiation Oncology

## 2023-09-08 ENCOUNTER — Other Ambulatory Visit: Payer: Self-pay

## 2023-09-08 DIAGNOSIS — F411 Generalized anxiety disorder: Secondary | ICD-10-CM

## 2023-09-08 DIAGNOSIS — F331 Major depressive disorder, recurrent, moderate: Secondary | ICD-10-CM

## 2023-09-08 DIAGNOSIS — Z510 Encounter for antineoplastic radiation therapy: Secondary | ICD-10-CM | POA: Diagnosis not present

## 2023-09-08 LAB — RAD ONC ARIA SESSION SUMMARY
Course Elapsed Days: 58
Plan Fractions Treated to Date: 29
Plan Prescribed Dose Per Fraction: 2 Gy
Plan Total Fractions Prescribed: 35
Plan Total Prescribed Dose: 70 Gy
Reference Point Dosage Given to Date: 58 Gy
Reference Point Session Dosage Given: 2 Gy
Session Number: 29

## 2023-09-08 NOTE — Progress Notes (Signed)
 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       CCA Biopsychosocial Intake/Chief Complaint:  Ricky [name] is a 68 y.o. year old male who presents alone to [facility] to establish care with therapist. Patient was referred by his psychiatrist, Dr Edda Goo with a history of depression and anxiety.  Current Symptoms/Problems: The patient reports experiencing a range of symptoms, including a lack of motivation, anhedonia (the inability to feel pleasure), feelings of anxiety, tension, a negative outlook, and uncontrollable worry. The patient attributes some of this anxiety to recent cancer treatment and the fear of what will happen with his mass after completing treatment.  He shares that he has experienced depressive symptoms for as long as he can remember, including a low mood and a negative perspective on life. He feels uncomfortable being around people, meeting new individuals, or going to new places. Additionally, he often experiences shakiness and tends to hide under the bed when someone visits his house, fearing that others are judging him. Reports a history of panic attacks whcih he lost 2-3 jobs due to walking off the job where he felt overwhelmed.   Patient Reported Schizophrenia/Schizoaffective Diagnosis in Past: No data recorded  Strengths: No data  recorded Preferences: No data recorded Abilities: No data recorded  Type of Services Patient Feels are Needed: Individual Outpatient Therapy   Initial Clinical Notes/Concerns: No data recorded  Mental Health Symptoms Depression:  Change in energy/activity; Difficulty Concentrating; Fatigue; Hopelessness; Increase/decrease in appetite; Irritability; Tearfulness; Worthlessness   Duration of Depressive symptoms: Greater than two weeks   Mania:  None   Anxiety:   Difficulty concentrating; Fatigue; Irritability; Restlessness; Sleep; Tension; Worrying   Psychosis:  None   Duration of Psychotic symptoms: No data recorded  Trauma:  None   Obsessions:  None   Compulsions:  None   Inattention:  None   Hyperactivity/Impulsivity:  None   Oppositional/Defiant Behaviors:  None   Emotional Irregularity:  Mood lability   Other Mood/Personality Symptoms:  No data recorded   Mental Status Exam Appearance and self-care  Stature:  Average   Weight:  Average weight   Clothing:  Casual   Grooming:  Normal   Cosmetic use:  None   Posture/gait:  Normal   Motor activity:  Restless   Sensorium  Attention:  Normal   Concentration:  Normal   Orientation:  X5   Recall/memory:  Normal   Affect and Mood  Affect:  Anxious   Mood:  Anxious   Relating  Eye contact:  Normal   Facial expression:  Anxious   Attitude toward examiner:  Cooperative   Thought and Language  Speech flow: Clear and Coherent   Thought content:  Appropriate to Mood and Circumstances   Preoccupation:  None   Hallucinations:  None   Organization:  No data recorded  [employer] of Knowledge:  Good   Intelligence:  Average   Abstraction:  Normal   Judgement:  Good   Reality Testing:  Realistic   Insight:  Fair   Decision Making:  Paralyzed   Social Functioning  Social Maturity:  Isolates   Social Judgement:  Normal   Stress  Stressors:  Work; Illness   Coping  Ability:  Overwhelmed   Skill Deficits:  Decision making; Interpersonal; Activities of daily living; Communication   Supports:  Family     Religion:    Leisure/Recreation:    Exercise/Diet:     CCA Employment/Education Employment/Work Situation: Employment / Work Situation Employment Situation: Retired Therapist, 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 Quit due to pain, used to work as a Lawyer since age 82.  Education: Education Is Patient Currently Attending School?: No Last Grade Completed: 12 Did You Graduate From [employer]?: Yes Did You Attend College?: No Did You Attend Graduate School?: No   CCA Family/Childhood History Family and Relationship History: Family history Marital status: Married Number of Years Married: 10 What types of issues is patient dealing with in the relationship?: Denies Does patient have children?: Yes How many children?: 3 How is patient's relationship with their children?: 3 children, age 45-41; Shares he is closest with his oldest child. Shares he does not get along with his other children.  Childhood History:  Childhood History By whom was/is the patient raised?: Both parents Additional childhood history information: Per previous documentation by psychiatrist "He has good relationship with his parents as a child, although he suffers from depression and anxiety "since born." Patient became tearful identifiying his father passed away one year ago. Description of patient's relationship with caregiver when they were a child: Shares he had a good relaitonship with both parents. Patient's description of current relationship with people who raised him/her: Both deceased. Does patient have siblings?: Yes Number of Siblings: 1 Description of patient's current relationship with siblings: One sibling, See each other once a month.  Child/Adolescent  Assessment:     CCA Substance Use Alcohol/Drug Use:                           ASAM's:  Six Dimensions of Multidimensional Assessment  Dimension 1:  Acute Intoxication and/or Withdrawal Potential:      Dimension 2:  Biomedical Conditions and Complications:      Dimension 3:  Emotional, Behavioral, or Cognitive Conditions and Complications:     Dimension 4:  Readiness to Change:     Dimension 5:  Relapse, Continued use, or Continued Problem Potential:     Dimension 6:  Recovery/Living Environment:     ASAM Severity Score:    ASAM Recommended Level of Treatment:     Substance use Disorder (SUD)    Recommendations for Services/Supports/Treatments:    DSM5 Diagnoses: Patient Active Problem List   Diagnosis Date Noted   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) 06/17/2023   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   Alcohol use disorder, moderate, in sustained remission (HCC) 05/18/2023   Somnolence 01/08/2023   Dyslipidemia 02/02/2022   B12 deficiency 11/14/2021   Overweight (BMI 25.0-29.9) 08/27/2021   Major depression 08/26/2021   Hyponatremia    Parkinson's disease (HCC)    Essential hypertension    Type 2 diabetes mellitus with hyperlipidemia (HCC)    Carpal tunnel syndrome, left 11/08/2020   Chronic bilateral thoracic back pain 10/15/2020   Lumbar spine  pain 10/15/2020   Thoracic radiculitis 10/15/2020   Chronic pain syndrome 10/15/2020   Bilateral hand numbness 10/09/2020   Moderate episode of recurrent major depressive disorder (HCC) 03/06/2020   Spinal stenosis of thoracic region 01/18/2020   Neck pain 12/14/2019   Radiculitis of right cervical region 12/14/2019   Erectile dysfunction due to diseases classified elsewhere 05/31/2019   Hx of chronic eczema 05/31/2019   Hyperlipidemia associated with type 2 diabetes mellitus (HCC) 05/31/2019   Carpal tunnel syndrome, right 03/15/2019    Eczema 06/27/2018   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 year old male who presents alone to [facility] to establish care with therapist. Patient was referred by his psychiatrist, Dr Edda Goo with a history of depression and anxiety.  The patient reports experiencing a range of symptoms, including a lack of motivation, anhedonia, feelings of anxiety, tension, a negative outlook, and uncontrollable worry. The patient attributes some of this anxiety to recent cancer treatment and the fear of what will happen with his mass after completing treatment.  He shares that he has experienced depressive symptoms for as long as he can remember, including a low mood and a negative perspective on life. He feels uncomfortable being around people, meeting new individuals, or going to new places. Additionally, he often experiences shakiness and tends to hide under the bed when someone visits his house, fearing that others are judging him. Reports a history of panic attacks which he lost 2-3 jobs due to walking off the job where he felt overwhelmed. Shares difficulties sleeping due to mind racing. Shares medication has not helped in the past.   Cln will rule out rule out social anxiety.   Reports despite his cancer diagnosis, he does not think "I can't do this or that." Shares the time it takes for his treatment serves as stressor.  Reports suicidal thoughts at 69 years old before he was able to get prescribed SSRI's.   Therapeutic Goals: "To be normal [people being happy, getting around, seeing my son again, communicating with others] like everybody else."  Patient Centered Plan: Patient is on the following Treatment Plan(s):  Anxiety and Depression   Referrals to Alternative Service(s): Referred to Alternative Service(s):   Place:   Date:   Time:     Referred to Alternative Service(s):   Place:   Date:   Time:    Referred to Alternative Service(s):   Place:   Date:   Time:    Referred to Alternative Service(s):   Place:   Date:   Time:      Collaboration of Care: AEB psychiatrist can access notes and cln. Will review psychiatrists' notes. Check in with the patient and will see LCSW per availability. Patient agreed with treatment recommendations.   Patient/Guardian was advised Release of Information must be obtained prior to any record release in order to collaborate their care with an outside provider. Patient/Guardian was advised if they have not already done so to contact the registration department to sign all necessary forms in order for us  to release information regarding their care.   Consent: Patient/Guardian gives verbal consent for treatment and assignment of benefits for services provided during this visit. Patient/Guardian expressed understanding and agreed to proceed.   Marvin Slot, LCSW

## 2023-09-09 ENCOUNTER — Ambulatory Visit

## 2023-09-09 ENCOUNTER — Other Ambulatory Visit: Payer: Self-pay

## 2023-09-09 ENCOUNTER — Ambulatory Visit
Admission: RE | Admit: 2023-09-09 | Discharge: 2023-09-09 | Disposition: A | Discharge status: Home / Self Care | Source: Ambulatory Visit | Attending: Radiation Oncology | Admitting: Radiation Oncology

## 2023-09-09 DIAGNOSIS — Z510 Encounter for antineoplastic radiation therapy: Secondary | ICD-10-CM | POA: Insufficient documentation

## 2023-09-09 DIAGNOSIS — C091 Malignant neoplasm of tonsillar pillar (anterior) (posterior): Secondary | ICD-10-CM | POA: Insufficient documentation

## 2023-09-09 DIAGNOSIS — C109 Malignant neoplasm of oropharynx, unspecified: Secondary | ICD-10-CM | POA: Diagnosis present

## 2023-09-09 LAB — RAD ONC ARIA SESSION SUMMARY
Course Elapsed Days: 59
Plan Fractions Treated to Date: 30
Plan Prescribed Dose Per Fraction: 2 Gy
Plan Total Fractions Prescribed: 35
Plan Total Prescribed Dose: 70 Gy
Reference Point Dosage Given to Date: 60 Gy
Reference Point Session Dosage Given: 2 Gy
Session Number: 30

## 2023-09-10 ENCOUNTER — Ambulatory Visit
Admission: RE | Admit: 2023-09-10 | Discharge: 2023-09-10 | Disposition: A | Discharge status: Home / Self Care | Source: Ambulatory Visit | Attending: Radiation Oncology | Admitting: Radiation Oncology

## 2023-09-10 ENCOUNTER — Ambulatory Visit

## 2023-09-10 ENCOUNTER — Other Ambulatory Visit: Payer: Self-pay

## 2023-09-10 DIAGNOSIS — Z510 Encounter for antineoplastic radiation therapy: Secondary | ICD-10-CM | POA: Diagnosis not present

## 2023-09-10 LAB — RAD ONC ARIA SESSION SUMMARY
Course Elapsed Days: 60
Plan Fractions Treated to Date: 31
Plan Prescribed Dose Per Fraction: 2 Gy
Plan Total Fractions Prescribed: 35
Plan Total Prescribed Dose: 70 Gy
Reference Point Dosage Given to Date: 62 Gy
Reference Point Session Dosage Given: 2 Gy
Session Number: 31

## 2023-09-13 ENCOUNTER — Encounter: Payer: Self-pay | Admitting: Oncology

## 2023-09-13 ENCOUNTER — Ambulatory Visit
Admission: RE | Admit: 2023-09-13 | Discharge: 2023-09-13 | Disposition: A | Discharge status: Home / Self Care | Source: Ambulatory Visit | Attending: Radiation Oncology | Admitting: Radiation Oncology

## 2023-09-13 ENCOUNTER — Other Ambulatory Visit: Payer: Self-pay

## 2023-09-13 DIAGNOSIS — Z510 Encounter for antineoplastic radiation therapy: Secondary | ICD-10-CM | POA: Diagnosis not present

## 2023-09-13 LAB — RAD ONC ARIA SESSION SUMMARY
Course Elapsed Days: 63
Plan Fractions Treated to Date: 32
Plan Prescribed Dose Per Fraction: 2 Gy
Plan Total Fractions Prescribed: 35
Plan Total Prescribed Dose: 70 Gy
Reference Point Dosage Given to Date: 64 Gy
Reference Point Session Dosage Given: 2 Gy
Session Number: 32

## 2023-09-13 MED FILL — Fosaprepitant Dimeglumine For IV Infusion 150 MG (Base Eq): INTRAVENOUS | Qty: 5 | Status: AC

## 2023-09-14 ENCOUNTER — Encounter: Payer: Self-pay | Admitting: Oncology

## 2023-09-14 ENCOUNTER — Other Ambulatory Visit: Payer: Self-pay

## 2023-09-14 ENCOUNTER — Inpatient Hospital Stay (HOSPITAL_BASED_OUTPATIENT_CLINIC_OR_DEPARTMENT_OTHER): Admitting: Oncology

## 2023-09-14 ENCOUNTER — Inpatient Hospital Stay (HOSPITAL_BASED_OUTPATIENT_CLINIC_OR_DEPARTMENT_OTHER)

## 2023-09-14 ENCOUNTER — Inpatient Hospital Stay: Attending: Oncology

## 2023-09-14 ENCOUNTER — Ambulatory Visit
Admission: RE | Admit: 2023-09-14 | Discharge: 2023-09-14 | Disposition: A | Discharge status: Home / Self Care | Source: Ambulatory Visit | Attending: Radiation Oncology | Admitting: Radiation Oncology

## 2023-09-14 VITALS — BP 94/64 | HR 81 | Temp 97.9°F | Resp 19 | Wt 155.1 lb

## 2023-09-14 DIAGNOSIS — B370 Candidal stomatitis: Secondary | ICD-10-CM | POA: Insufficient documentation

## 2023-09-14 DIAGNOSIS — T451X5A Adverse effect of antineoplastic and immunosuppressive drugs, initial encounter: Secondary | ICD-10-CM

## 2023-09-14 DIAGNOSIS — F32A Depression, unspecified: Secondary | ICD-10-CM | POA: Diagnosis not present

## 2023-09-14 DIAGNOSIS — Z510 Encounter for antineoplastic radiation therapy: Secondary | ICD-10-CM | POA: Diagnosis not present

## 2023-09-14 DIAGNOSIS — Z79891 Long term (current) use of opiate analgesic: Secondary | ICD-10-CM | POA: Insufficient documentation

## 2023-09-14 DIAGNOSIS — N179 Acute kidney failure, unspecified: Secondary | ICD-10-CM | POA: Insufficient documentation

## 2023-09-14 DIAGNOSIS — E559 Vitamin D deficiency, unspecified: Secondary | ICD-10-CM | POA: Insufficient documentation

## 2023-09-14 DIAGNOSIS — Z79630 Long term (current) use of alkylating agent: Secondary | ICD-10-CM | POA: Insufficient documentation

## 2023-09-14 DIAGNOSIS — G893 Neoplasm related pain (acute) (chronic): Secondary | ICD-10-CM

## 2023-09-14 DIAGNOSIS — F419 Anxiety disorder, unspecified: Secondary | ICD-10-CM | POA: Diagnosis not present

## 2023-09-14 DIAGNOSIS — D6959 Other secondary thrombocytopenia: Secondary | ICD-10-CM

## 2023-09-14 DIAGNOSIS — C109 Malignant neoplasm of oropharynx, unspecified: Secondary | ICD-10-CM | POA: Diagnosis present

## 2023-09-14 DIAGNOSIS — D6481 Anemia due to antineoplastic chemotherapy: Secondary | ICD-10-CM | POA: Diagnosis not present

## 2023-09-14 DIAGNOSIS — Z5111 Encounter for antineoplastic chemotherapy: Secondary | ICD-10-CM

## 2023-09-14 DIAGNOSIS — R634 Abnormal weight loss: Secondary | ICD-10-CM | POA: Insufficient documentation

## 2023-09-14 DIAGNOSIS — E860 Dehydration: Secondary | ICD-10-CM | POA: Insufficient documentation

## 2023-09-14 DIAGNOSIS — R5383 Other fatigue: Secondary | ICD-10-CM

## 2023-09-14 LAB — RAD ONC ARIA SESSION SUMMARY
Course Elapsed Days: 64
Plan Fractions Treated to Date: 33
Plan Prescribed Dose Per Fraction: 2 Gy
Plan Total Fractions Prescribed: 35
Plan Total Prescribed Dose: 70 Gy
Reference Point Dosage Given to Date: 66 Gy
Reference Point Session Dosage Given: 2 Gy
Session Number: 33

## 2023-09-14 LAB — CBC WITH DIFFERENTIAL (CANCER CENTER ONLY)
Abs Immature Granulocytes: 0.01 10*3/uL (ref 0.00–0.07)
Basophils Absolute: 0 10*3/uL (ref 0.0–0.1)
Basophils Relative: 1 %
Eosinophils Absolute: 0 10*3/uL (ref 0.0–0.5)
Eosinophils Relative: 1 %
HCT: 24.7 % — ABNORMAL LOW (ref 39.0–52.0)
Hemoglobin: 8.6 g/dL — ABNORMAL LOW (ref 13.0–17.0)
Immature Granulocytes: 0 %
Lymphocytes Relative: 5 %
Lymphs Abs: 0.2 10*3/uL — ABNORMAL LOW (ref 0.7–4.0)
MCH: 30.8 pg (ref 26.0–34.0)
MCHC: 34.8 g/dL (ref 30.0–36.0)
MCV: 88.5 fL (ref 80.0–100.0)
Monocytes Absolute: 0.2 10*3/uL (ref 0.1–1.0)
Monocytes Relative: 7 %
Neutro Abs: 2.9 10*3/uL (ref 1.7–7.7)
Neutrophils Relative %: 86 %
Platelet Count: 104 10*3/uL — ABNORMAL LOW (ref 150–400)
RBC: 2.79 MIL/uL — ABNORMAL LOW (ref 4.22–5.81)
RDW: 17.2 % — ABNORMAL HIGH (ref 11.5–15.5)
WBC Count: 3.4 10*3/uL — ABNORMAL LOW (ref 4.0–10.5)
nRBC: 0 % (ref 0.0–0.2)

## 2023-09-14 LAB — BASIC METABOLIC PANEL - CANCER CENTER ONLY
Anion gap: 10 (ref 5–15)
BUN: 12 mg/dL (ref 8–23)
CO2: 25 mmol/L (ref 22–32)
Calcium: 8.7 mg/dL — ABNORMAL LOW (ref 8.9–10.3)
Chloride: 101 mmol/L (ref 98–111)
Creatinine: 1.35 mg/dL — ABNORMAL HIGH (ref 0.61–1.24)
GFR, Estimated: 58 mL/min — ABNORMAL LOW (ref >60)
Glucose, Bld: 117 mg/dL — ABNORMAL HIGH (ref 70–99)
Potassium: 3.8 mmol/L (ref 3.5–5.1)
Sodium: 136 mmol/L (ref 135–145)

## 2023-09-14 LAB — TSH: TSH: 2.016 u[IU]/mL (ref 0.350–4.500)

## 2023-09-14 LAB — MAGNESIUM: Magnesium: 1.4 mg/dL — ABNORMAL LOW (ref 1.7–2.4)

## 2023-09-14 LAB — VITAMIN D 25 HYDROXY (VIT D DEFICIENCY, FRACTURES): Vit D, 25-Hydroxy: 28.41 ng/mL — ABNORMAL LOW (ref 30–100)

## 2023-09-14 LAB — VITAMIN B12: Vitamin B-12: 479 pg/mL (ref 180–914)

## 2023-09-14 MED ORDER — MAGNESIUM SULFATE 2 GM/50ML IV SOLN
2.0000 g | Freq: Once | INTRAVENOUS | Status: AC
  Administered 2023-09-14: 2 g via INTRAVENOUS
  Filled 2023-09-14: qty 50

## 2023-09-14 MED ORDER — DEXAMETHASONE SODIUM PHOSPHATE 10 MG/ML IJ SOLN
10.0000 mg | Freq: Once | INTRAMUSCULAR | Status: AC
  Administered 2023-09-14: 10 mg via INTRAVENOUS
  Filled 2023-09-14: qty 1

## 2023-09-14 MED ORDER — HEPARIN SOD (PORK) LOCK FLUSH 100 UNIT/ML IV SOLN
500.0000 [IU] | Freq: Once | INTRAVENOUS | Status: AC | PRN
  Administered 2023-09-14: 500 [IU]
  Filled 2023-09-14: qty 5

## 2023-09-14 MED ORDER — SODIUM CHLORIDE 0.9 % IV SOLN
150.0000 mg | Freq: Once | INTRAVENOUS | Status: AC
  Administered 2023-09-14: 150 mg via INTRAVENOUS
  Filled 2023-09-14: qty 5
  Filled 2023-09-14: qty 150

## 2023-09-14 MED ORDER — FENTANYL 25 MCG/HR TD PT72
1.0000 | MEDICATED_PATCH | TRANSDERMAL | 0 refills | Status: DC
Start: 2023-09-14 — End: 2023-09-29

## 2023-09-14 MED ORDER — OXYCODONE HCL 10 MG PO TABS: 10.0000 mg | ORAL_TABLET | ORAL | 0 refills | Status: DC | PRN

## 2023-09-14 MED ORDER — SODIUM CHLORIDE 0.9 % IV SOLN
INTRAVENOUS | Status: DC
  Filled 2023-09-14: qty 250

## 2023-09-14 MED ORDER — PALONOSETRON HCL INJECTION 0.25 MG/5ML
0.2500 mg | Freq: Once | INTRAVENOUS | Status: AC
  Administered 2023-09-14: 0.25 mg via INTRAVENOUS
  Filled 2023-09-14: qty 5

## 2023-09-14 MED ORDER — POTASSIUM CHLORIDE IN NACL 20-0.9 MEQ/L-% IV SOLN
Freq: Once | INTRAVENOUS | Status: AC
  Filled 2023-09-14: qty 1000

## 2023-09-14 MED ORDER — NYSTATIN 100000 UNIT/ML MT SUSP
5.0000 mL | Freq: Four times a day (QID) | OROMUCOSAL | 0 refills | Status: DC
Start: 2023-09-14 — End: 2023-09-29

## 2023-09-14 MED ORDER — SODIUM CHLORIDE 0.9 % IV SOLN
30.0000 mg/m2 | Freq: Once | INTRAVENOUS | Status: AC
  Administered 2023-09-14: 59 mg via INTRAVENOUS
  Filled 2023-09-14: qty 59

## 2023-09-14 MED ORDER — SODIUM CHLORIDE 0.9% FLUSH
10.0000 mL | INTRAVENOUS | Status: DC | PRN
  Administered 2023-09-14: 10 mL
  Filled 2023-09-14: qty 10

## 2023-09-14 NOTE — Patient Instructions (Signed)
 [facility] - A DEPT OF [facility]  Discharge Instructions: Thank you for choosing [facility] to provide your oncology and hematology care.  If you have a lab appointment with the Cancer Center, please go directly to the Cancer Center and check in at the registration area.  Wear comfortable clothing and clothing appropriate for easy access to any Portacath or PICC line.   We strive to give you quality time with your provider. You may need to reschedule your appointment if you arrive late (15 or more minutes).  Arriving late affects you and other patients whose appointments are after yours.  Also, if you miss three or more appointments without notifying the office, you may be dismissed from the clinic at the provider's discretion.      For prescription refill requests, have your pharmacy contact our office and allow 72 hours for refills to be completed.    Today you received the following chemotherapy and/or immunotherapy agents- cisplatin      To help prevent nausea and vomiting after your treatment, we encourage you to take your nausea medication as directed.  BELOW ARE SYMPTOMS THAT SHOULD BE REPORTED IMMEDIATELY: *FEVER GREATER THAN 100.4 F (38 C) OR HIGHER *CHILLS OR SWEATING *NAUSEA AND VOMITING THAT IS NOT CONTROLLED WITH YOUR NAUSEA MEDICATION *UNUSUAL SHORTNESS OF BREATH *UNUSUAL BRUISING OR BLEEDING *URINARY PROBLEMS (pain or burning when urinating, or frequent urination) *BOWEL PROBLEMS (unusual diarrhea, constipation, pain near the anus) TENDERNESS IN MOUTH AND THROAT WITH OR WITHOUT PRESENCE OF ULCERS (sore throat, sores in mouth, or a toothache) UNUSUAL RASH, SWELLING OR PAIN  UNUSUAL VAGINAL DISCHARGE OR ITCHING   Items with * indicate a potential emergency and should be followed up as soon as possible or go to the Emergency Department if any problems should occur.  Please show the CHEMOTHERAPY ALERT CARD or IMMUNOTHERAPY  ALERT CARD at check-in to the Emergency Department and triage nurse.  Should you have questions after your visit or need to cancel or reschedule your appointment, please contact [facility] - A DEPT OF [facility]  [phone] and follow the prompts.  Office hours are 8:00 a.m. to 4:30 p.m. Monday - Friday. Please note that voicemails left after 4:00 p.m. may not be returned until the following business day.  We are closed weekends and major holidays. You have access to a nurse at all times for urgent questions. Please call the main number to the clinic [phone] and follow the prompts.  For any non-urgent questions, you may also contact your provider using MyChart. We now offer e-Visits for anyone 9 and older to request care online for non-urgent symptoms. For details visit [email or web address].   Also download the MyChart app! Go to the app store, search "MyChart", open the app, select , and log in with your MyChart username and password.

## 2023-09-14 NOTE — Progress Notes (Signed)
 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 He was ultimately referred By his dentist to ENT and underwent CT soft tissue neck which showed a 4 cm partially necrotic mass at the glossotonsillar sulcus on the right side and to level 2 lymph nodes measuring 15 and 14 mm that appeared pathologic. Core needle biopsy of the level 2 lymph node was consistent with squamous cell carcinoma. Cells diffusely positive for p40 and p16. Plan is to proceed with concurrent weekly cisplatin  chemotherapy with radiation   Interval history-patient reports ongoing pain in his mouth.  He has baseline fatigue.  ECOG PS- 1 Pain scale- 3 Opioid associated constipation- no  Review of systems- Review of Systems  Constitutional:  Positive for malaise/fatigue. Negative for chills, fever and weight loss.  HENT:  Negative for congestion, ear discharge and nosebleeds.   Eyes:  Negative for blurred vision.  Respiratory:  Negative for cough, hemoptysis, sputum production, shortness of breath and wheezing.   Cardiovascular:  Negative for chest pain, palpitations, orthopnea and claudication.  Gastrointestinal:  Negative for abdominal pain, blood in stool, constipation, diarrhea, heartburn, melena, nausea and vomiting.  Genitourinary:  Negative for dysuria,  flank pain, frequency, hematuria and urgency.  Musculoskeletal:  Negative for back pain, joint pain and myalgias.  Skin:  Negative for rash.  Neurological:  Negative for dizziness, tingling, focal weakness, seizures, weakness and headaches.  Endo/Heme/Allergies:  Does not bruise/bleed easily.  Psychiatric/Behavioral:  Negative for depression and suicidal ideas. The patient does not have insomnia.       Allergies  Allergen Reactions   Mirtazapine Rash   Other Itching and Rash    ETCO2 tubing. Medtronic Microstream advance     Past Medical History:  Diagnosis Date   Anxiety    Arthritis    Carpal tunnel syndrome    Depression    Diabetes (HCC)    Hyperlipidemia    Hypertension    Loss of teeth due to extraction    Neoplasm related pain    Parkinson's disease [facility])      Past Surgical History:  Procedure Laterality Date   CARPAL TUNNEL RELEASE Right 11/28/2020   Procedure: CARPAL TUNNEL RELEASE ENDOSCOPIC;  Surgeon: Elner Hahn, MD;  Location: [facility] ORS;  Service: Orthopedics;  Laterality: Right;   CARPAL TUNNEL RELEASE Left 01/22/2021   Procedure: CARPAL TUNNEL RELEASE ENDOSCOPIC;  Surgeon: Elner Hahn, MD;  Location: [facility] ORS;  Service: Orthopedics;  Laterality: Left;   IR IMAGING GUIDED PORT INSERTION  06/30/2023   KNEE ARTHROSCOPY Right     Social History   Socioeconomic History   Marital status: Single    Spouse name: [name]   Number of children: Not on file   Years of education: Not on file   Highest education level: Not on file  Occupational History   Not on file  Tobacco Use  Smoking status: Never   Smokeless tobacco: Never  Vaping Use   Vaping status: Never Used  Substance and Sexual Activity   Alcohol use: Not Currently   Drug use: Never   Sexual activity: Not Currently    Birth control/protection: None  Other Topics Concern   Not on file  Social History Narrative   Lives with friend   Social Drivers of Health   Financial Resource Strain:  Not on file  Food Insecurity: No Food Insecurity (06/22/2023)   Hunger Vital Sign    Worried About Running Out of Food in the Last Year: Never true    Ran Out of Food in the Last Year: Never true  Transportation Needs: No Transportation Needs (06/22/2023)   PRAPARE - Administrator, Civil Service (Medical): No    Lack of Transportation (Non-Medical): No  Physical Activity: Not on file  Stress: Not on file  Social Connections: Not on file  Intimate Partner Violence: Not At Risk (06/22/2023)   Humiliation, Afraid, Rape, and Kick questionnaire    Fear of Current or Ex-Partner: No    Emotionally Abused: No    Physically Abused: No    Sexually Abused: No    Family History  Problem Relation Age of Onset   Diabetes Mother    Hypertension Mother    CVA Father    Heart attack Father    Depression Sister    Depression Brother      Current Outpatient Medications:    ACCU-CHEK GUIDE TEST test strip, TEST 2X DAILY, Disp: , Rfl:    Accu-Chek Softclix Lancets lancets, 2 (two) times daily., Disp: , Rfl:    ARIPiprazole  (ABILIFY ) 30 MG tablet, Take 30 mg by mouth daily., Disp: , Rfl:    atenolol (TENORMIN) 50 MG tablet, Take 50 mg by mouth daily., Disp: , Rfl:    Blood Glucose Monitoring Suppl (ACCU-CHEK GUIDE ME) w/Device KIT, See admin instructions., Disp: , Rfl:    buPROPion  (WELLBUTRIN  XL) 150 MG 24 hr tablet, Take 1 tablet (150 mg total) by mouth daily. Total of 450 mg daily. Take along with 300 mg tab, Disp: 30 tablet, Rfl: 5   buPROPion  (WELLBUTRIN  XL) 150 MG 24 hr tablet, Take by mouth., Disp: , Rfl:    buPROPion  (WELLBUTRIN  XL) 300 MG 24 hr tablet, Take 1 tablet (300 mg total) by mouth daily. Total of 450 mg daily. Take along with 150 mg tab, Disp: 30 tablet, Rfl: 5   busPIRone  (BUSPAR ) 7.5 MG tablet, Take 1 tablet (7.5 mg total) by mouth 2 (two) times daily., Disp: 60 tablet, Rfl: 3   calcium  carbonate (CALCIUM  600) 600 MG TABS tablet, Take 1 tablet (600 mg total) by mouth  daily. For low calcium , Disp: 60 tablet, Rfl: 0   carbidopa -levodopa  (SINEMET  IR) 25-100 MG tablet, Take 1 tablet by mouth 5 (five) times daily. 3 in morning 2 in evening, Disp: , Rfl:    clonazePAM  (KLONOPIN ) 1 MG tablet, Take 1 tablet (1 mg total) by mouth 3 (three) times daily as needed for anxiety., Disp: 90 tablet, Rfl: 0   dexamethasone  (DECADRON ) 2 MG tablet, Take 1 tablet by mouth daily x 1 week, then take 1/2 tablet daily x 1 week. Do not abruptly stop., Disp: 14 tablet, Rfl: 1   dexamethasone  (DECADRON ) 2 MG tablet, Take 1 tablet (2 mg total) by mouth daily., Disp: 5 tablet, Rfl: 0   folic acid  (FOLVITE ) 1 MG tablet, Take 1 tablet (1 mg total) by mouth  daily., Disp: 30 tablet, Rfl: 3   ketoconazole (NIZORAL) 2 % shampoo, Apply 1 application  topically every other day., Disp: , Rfl:    lidocaine -prilocaine  (EMLA ) cream, Apply to affected area once, Disp: 30 g, Rfl: 3   magic mouthwash (multi-ingredient) oral suspension, Swish and swallow 5-10 mLs by mouth 4 (four) times daily., Disp: 480 mL, Rfl: 3   magnesium  chloride (SLOW-MAG) 64 MG TBEC SR tablet, Take 2 tablets (128 mg total) by mouth daily. For low magnesium , Disp: 120 tablet, Rfl: 00   metFORMIN  (GLUCOPHAGE ) 500 MG tablet, Take 500 mg by mouth daily with breakfast., Disp: , Rfl:    nystatin  (MYCOSTATIN ) 100000 UNIT/ML suspension, Take 5 mLs (500,000 Units total) by mouth 4 (four) times daily., Disp: 60 mL, Rfl: 0   ondansetron  (ZOFRAN ) 8 MG tablet, Take 1 tablet (8 mg total) by mouth every 8 (eight) hours as needed for nausea or vomiting. Start on the third day after cisplatin ., Disp: 30 tablet, Rfl: 1   prochlorperazine  (COMPAZINE ) 10 MG tablet, Take 1 tablet (10 mg total) by mouth every 6 (six) hours as needed (Nausea or vomiting)., Disp: 30 tablet, Rfl: 1   rosuvastatin  (CRESTOR ) 20 MG tablet, Take 20 mg by mouth daily., Disp: , Rfl:    sertraline  (ZOLOFT ) 100 MG tablet, Take 1.5 tablets (150 mg total) by mouth at bedtime., Disp: 45  tablet, Rfl: 3   sucralfate  (CARAFATE ) 1 g tablet, Take 1 tablet (1 g total) by mouth 3 (three) times daily before meals. Dissolve in 4 tbs of warm water , swish and swallow, Disp: 90 tablet, Rfl: 1   tamsulosin (FLOMAX) 0.4 MG CAPS capsule, Take by mouth., Disp: , Rfl:    traZODone  (DESYREL ) 50 MG tablet, Take 0.5-2 tablets (25-100 mg total) by mouth at bedtime as needed for sleep., Disp: 60 tablet, Rfl: 1   fentaNYL  (DURAGESIC ) 25 MCG/HR, Place 1 patch onto the skin every 3 (three) days., Disp: 10 patch, Rfl: 0   magic mouthwash w/lidocaine  SOLN, Take 5-10 mLs by mouth 4 (four) times daily as needed for mouth pain. Suspension contains equal amounts of Maalox Extra Strength, nystatin , diphenhydramine and lidocaine . (Patient not taking: Reported on 09/14/2023), Disp: 480 mL, Rfl: 3   Oxycodone  HCl 10 MG TABS, Take 1 tablet (10 mg total) by mouth every 4 (four) hours as needed., Disp: 90 tablet, Rfl: 0   sildenafil (VIAGRA) 50 MG tablet, Take 50 mg by mouth daily as needed for erectile dysfunction. (Patient not taking: Reported on 08/30/2023), Disp: , Rfl:    tadalafil (CIALIS) 5 MG tablet, Take 5 mg by mouth daily. (Patient not taking: Reported on 09/14/2023), Disp: , Rfl:  No current facility-administered medications for this visit.  Facility-Administered Medications Ordered in Other Visits:    0.9 %  sodium chloride  infusion, , Intravenous, Continuous, Avonne Boettcher, MD, Last Rate: 10 mL/hr at 09/14/23 0935, New Bag at 09/14/23 0935   CISplatin  (PLATINOL ) 59 mg in sodium chloride  0.9 % 250 mL chemo infusion, 30 mg/m2 (Treatment Plan Recorded), Intravenous, Once, Avonne Boettcher, MD   dexamethasone  (DECADRON ) injection 10 mg, 10 mg, Intravenous, Once, Avonne Boettcher, MD   fosaprepitant  (EMEND) 150 mg in sodium chloride  0.9 % 145 mL IVPB, 150 mg, Intravenous, Once, Avonne Boettcher, MD   heparin  lock flush 100 unit/mL, 500 Units, Intracatheter, Once PRN, Chrissi Crow C, MD   palonosetron  (ALOXI ) injection  0.25 mg, 0.25 mg, Intravenous, Once, Avonne Boettcher, MD   sodium chloride  flush (NS)  0.9 % injection 10 mL, 10 mL, Intracatheter, PRN, Avonne Boettcher, MD  Physical exam:  Vitals:   09/14/23 0852  BP: 94/64  Pulse: 81  Resp: 19  Temp: 97.9 F (36.6 C)  SpO2: 98%  Weight: 155 lb 1.6 oz (70.4 kg)   Physical Exam HENT:     Mouth/Throat:     Comments: Mild oral thrush noted Cardiovascular:     Rate and Rhythm: Normal rate and regular rhythm.     Heart sounds: Normal heart sounds.  Pulmonary:     Effort: Pulmonary effort is normal.     Breath sounds: Normal breath sounds.  Abdominal:     General: Bowel sounds are normal.     Palpations: Abdomen is soft.  Skin:    General: Skin is warm and dry.  Neurological:     Mental Status: He is alert and oriented to person, place, and time.      I have personally reviewed labs listed below:    Latest Ref Rng & Units 09/14/2023    8:36 AM  CMP  Glucose 70 - 99 mg/dL 161   BUN 8 - 23 mg/dL 12   Creatinine 0.96 - 1.24 mg/dL 0.45   Sodium 409 - 811 mmol/L 136   Potassium 3.5 - 5.1 mmol/L 3.8   Chloride 98 - 111 mmol/L 101   CO2 22 - 32 mmol/L 25   Calcium  8.9 - 10.3 mg/dL 8.7       Latest Ref Rng & Units 09/14/2023    8:37 AM  CBC  WBC 4.0 - 10.5 K/uL 3.4   Hemoglobin 13.0 - 17.0 g/dL 8.6   Hematocrit 91.4 - 52.0 % 24.7   Platelets 150 - 400 K/uL 104      Assessment and plan- Patient is a 68 y.o. 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  He is here for on treatment assessment prior to cycle 10 of weekly cisplatin  chemotherapy  Platelet counts have improved after holding treatment last week and is up to 204 today.  White count was 3.4 with an ANC more than 1.  He will therefore proceed with cycle 7 of weekly cisplatin  chemotherapy today at 30 mg/m which will be his last dose.  Plan to get PET scan in about 10 weeks from now  AKI: Creatinine has remained at 1.3 since last week.  He will be  receiving pre and post cisplatin  fluids today and I will plan to bring him for more fluids tomorrow and repeat BMP again next week.   Chemo induced anemia: Overall stable and likely to improve now that he is completing chemotherapy  Hypomagnesemia: Will replace IV magnesium  with IV fluids tomorrow.  Oral thrush: We are sending a prescription for nystatin  swish and spit  Neoplasm related pain: Fentanyl  and oxycodone  prescription renewed today.  Encouraged him to reduce the usage of opioids over the next few months so he can completely come off opioids in about 3 months from now.     Visit Diagnosis 1. Neoplasm related pain   2. Squamous cell carcinoma of oropharynx (HCC)   3. Encounter for antineoplastic chemotherapy   4. AKI (acute kidney injury) (HCC)   5. Hypomagnesemia   6. Chemotherapy-induced thrombocytopenia   7. Oral thrush      Dr. Seretha Dance, MD, MPH [facility] [phone] 09/14/2023 11:19 AM

## 2023-09-15 ENCOUNTER — Other Ambulatory Visit: Payer: Self-pay

## 2023-09-15 ENCOUNTER — Inpatient Hospital Stay

## 2023-09-15 ENCOUNTER — Ambulatory Visit
Admission: RE | Admit: 2023-09-15 | Discharge: 2023-09-15 | Disposition: A | Discharge status: Home / Self Care | Source: Ambulatory Visit | Attending: Radiation Oncology | Admitting: Radiation Oncology

## 2023-09-15 DIAGNOSIS — Z5111 Encounter for antineoplastic chemotherapy: Secondary | ICD-10-CM | POA: Diagnosis not present

## 2023-09-15 DIAGNOSIS — Z510 Encounter for antineoplastic radiation therapy: Secondary | ICD-10-CM | POA: Diagnosis not present

## 2023-09-15 LAB — RAD ONC ARIA SESSION SUMMARY
Course Elapsed Days: 65
Plan Fractions Treated to Date: 34
Plan Prescribed Dose Per Fraction: 2 Gy
Plan Total Fractions Prescribed: 35
Plan Total Prescribed Dose: 70 Gy
Reference Point Dosage Given to Date: 68 Gy
Reference Point Session Dosage Given: 2 Gy
Session Number: 34

## 2023-09-15 LAB — FOLATE RBC
Folate, Hemolysate: 403 ng/mL
Folate, RBC: 1652 ng/mL (ref >498)
Hematocrit: 24.4 % — ABNORMAL LOW (ref 37.5–51.0)

## 2023-09-15 MED ORDER — SODIUM CHLORIDE 0.9 % IV SOLN
Freq: Once | INTRAVENOUS | Status: AC
  Filled 2023-09-15: qty 250

## 2023-09-15 MED ORDER — MAGNESIUM SULFATE 4 GM/100ML IV SOLN
4.0000 g | Freq: Once | INTRAVENOUS | Status: AC
  Administered 2023-09-15: 4 g via INTRAVENOUS
  Filled 2023-09-15: qty 100

## 2023-09-15 MED ORDER — HEPARIN SOD (PORK) LOCK FLUSH 100 UNIT/ML IV SOLN
500.0000 [IU] | Freq: Once | INTRAVENOUS | Status: AC
  Administered 2023-09-15: 500 [IU] via INTRAVENOUS
  Filled 2023-09-15: qty 5

## 2023-09-15 NOTE — Addendum Note (Signed)
 Addended byOrpha Blade, Caine Barfield L on: 09/15/2023 02:10 PM   Modules accepted: Orders

## 2023-09-15 NOTE — Patient Instructions (Signed)
 Dehydration, Adult Dehydration is a condition in which there is not enough water  or other fluids in the body. This happens when a person loses more fluids than they take in. Important organs cannot work right without the right amount of fluids. Any loss of fluids from the body can cause dehydration. Dehydration can be mild, worse, or very bad. It should be treated right away to keep it from getting very bad. What are the causes? Conditions that cause loss of water  in the body. They include: Watery poop (diarrhea). Vomiting. Sweating a lot. Fever. Infection. Peeing (urinating) a lot. Not drinking enough fluids. Certain medicines, such as medicines that take extra fluid out of the body (diuretics). Lack of safe drinking water . Not being able to get enough water  and food. What increases the risk? Having a long-term (chronic) illness that has not been treated the right way, such as: Diabetes. Heart disease. Kidney disease. Being 20 years of age or older. Having a disability. Living in a place that is high above the ground or sea (high in altitude). The thinner, drier air causes more fluid loss. Doing exercises that put stress on your body for a long time. Being active when in hot places. What are the signs or symptoms? Symptoms of dehydration depend on how bad it is. Mild or worse dehydration Thirst. Dry lips or dry mouth. Feeling dizzy or light-headed. Muscle cramps. Passing little pee or dark pee. Pee may be the color of tea. Headache. Very bad dehydration Changes in skin. Skin may: Be cold to the touch (clammy). Be blotchy or pale. Not go back to normal right after you pinch it and let it go. Little or no tears, pee, or sweat. Fast breathing. Low blood pressure. Weak pulse. Pulse that is more than 100 beats a minute when you are sitting still. Other changes, such as: Feeling very thirsty. Eyes that look hollow (sunken). Cold hands and feet. Being confused. Being very  tired (lethargic) or having trouble waking from sleep. Losing weight. Loss of consciousness. How is this treated? Treatment for this condition depends on how bad your dehydration is. Treatment should start right away. Do not wait until your condition gets very bad. Very bad dehydration is an emergency. You will need to go to a hospital. Mild or worse dehydration can be treated at home. You may be asked to: Drink more fluids. Drink an oral rehydration solution (ORS). This drink gives you the right amount of fluids, salts, and minerals (electrolytes). Very bad dehydration can be treated: With fluids through an IV tube. By correcting low levels of electrolytes in the body. By treating the problem that caused your dehydration. Follow these instructions at home: Oral rehydration solution If told by your doctor, drink an ORS: Make an ORS. Use instructions on the package. Start by drinking small amounts, about  cup (120 mL) every 5-10 minutes. Slowly drink more until you have had the amount that your doctor said to have.  Eating and drinking  Drink enough clear fluid to keep your pee pale yellow. If you were told to drink an ORS, finish the ORS first. Then, start slowly drinking other clear fluids. Drink fluids such as: Water . Do not drink only water . Doing that can make the salt (sodium) level in your body get too low. Water  from ice chips you suck on. Fruit juice that you have added water  to (diluted). Low-calorie sports drinks. Eat foods that have the right amounts of salts and minerals, such as bananas, oranges, potatoes,  tomatoes, or spinach. Do not drink alcohol. Avoid drinks that have caffeine or sugar. These include:: High-calorie sports drinks. Fruit juice that you did not add water  to. Albin Huh. Coffee or energy drinks. Avoid foods that are greasy or have a lot of fat or sugar. General instructions Take over-the-counter and prescription medicines only as told by your doctor. Do  not take sodium tablets. Doing that can make the salt level in your body get too high. Return to your normal activities as told by your doctor. Ask your doctor what activities are safe for you. Keep all follow-up visits. Your doctor may check and change your treatment. Contact a doctor if: You have pain in your belly (abdomen) and the pain: Gets worse. Stays in one place. You have a rash. You have a stiff neck. You get angry or annoyed more easily than normal. You are more tired or have a harder time waking than normal. You feel weak or dizzy. You feel very thirsty. Get help right away if: You have any symptoms of very bad dehydration. You vomit every time you eat or drink. Your vomiting gets worse, does not go away, or you vomit blood or green stuff. You are getting treatment, but symptoms are getting worse. You have a fever. You have a very bad headache. You have: Diarrhea that gets worse or does not go away. Blood in your poop (stool). This may cause poop to look black and tarry. No pee in 6-8 hours. Only a small amount of pee in 6-8 hours, and the pee is very dark. You have trouble breathing. These symptoms may be an emergency. Get help right away. Call 911. Do not wait to see if the symptoms will go away. Do not drive yourself to the hospital. This information is not intended to replace advice given to you by your health care provider. Make sure you discuss any questions you have with your health care provider. Document Revised: 11/24/2021 Document Reviewed: 11/24/2021 Elsevier Patient Education  2024 [employer]Fluids Given Through an IV (IV Infusion Therapy): What to Expect IV infusion therapy is a treatment to deliver a fluid, called an infusion, into a vein. You may have IV infusion to get: Fluids. Medicines. Nutrition. Chemotherapy. This is medicines to stop or slow down cancer cells. Blood or blood products. Dye that is given before an MRI or a CT scan. This is  called contrast dye. Tell a health care provider about: Any allergies you have. This includes allergies to anesthesia or dyes. All medicines you take. These include vitamins, herbs, eye drops, and creams. Any bleeding problems you have. Any surgeries you've had, including if you've had lymph nodes taken out of your armpit or if you have a arteriovenous fistula for dialysis. Any medical problems you have. Whether you're pregnant or may be pregnant. Whether you've used IV drugs. What are the risks? Your health care provider will talk with you about risks. These may include: Pain, bruising, or bleeding. Infection. The IV leaking or moving out of place. Damage to blood vessels or nerves. Allergic reactions to medicines or dyes. A blood clot. An air bubble in the vein, also called an air embolism. What happens before the procedure? Eat and drink only as you've been told. Ask about changing or stopping: Any medicines you take. Any vitamins, herbs, or supplements you take. What happens during the procedure?     Placing the catheter Your skin at the IV site will be washed with fluid that kills germs. This will  help prevent infection. IV infusion therapy starts with a procedure to place a soft tube called a catheter into a vein. An IV tube will be attached to the catheter to let the infusion flow into your blood. Your catheter may be placed: Into a vein that is usually in the bend of the elbow, forearm, or back of the hand. This is called a peripheral IV catheter. This may need to be put into a vein each time you get an infusion. Into a vein near your elbow. This is called a midline catheter or a peripherally inserted central catheter (PICC). These types of catheters may stay in place for weeks or months at a time so you can receive repeated infusions through it. Into a vein near your neck that leads to your heart. This is called a non-tunneled catheter. This is only used for short amounts of  time because it can cause infection. Through the skin of your chest and into a large vein that leads to your heart. This is called a tunneled catheter. This may stay in your body for months or years. Into an implanted port. An implanted port is a device that is surgically inserted under the skin of the chest to provide long-term IV access. The catheter will connect the port to a large vein in the chest or upper arm. A port may be kept in place for many months or years. Each time you have an infusion, a needle will be inserted through your skin to connect the catheter to the port. Doing the infusion To start the infusion, your provider will: Attach the IV tubing to your catheter. Use a tape or a bandage to hold the IV in place against your skin. An IV pump may be used to control the flow of the IV infusion. During the infusion, your provider will check the area to make sure: There is no bleeding, swelling, or pain. Your IV infusion is flowing correctly. After the infusion, your provider will: Take off the bandage or tape. Disconnect the tubing from the catheter. Remove the catheter, if you have a peripheral IV. Apply pressure over the IV insertion site to stop bleeding, then cover the area with a bandage. If you have an implanted port, PICC, non-tunneled, or tunneled catheter, your catheter may remain in place. This depends on how many times you will need treatment, your medical condition, and what type of catheter you have. These steps may vary. Ask what you can expect. What can I expect after the procedure? You may be watched closely until you leave. This includes checking your pain level, blood pressure, heart rate, and breathing rate. Your provider will check to make sure there are no signs of infection. Follow these instructions at home: Take your medicines only as told. Change or take off your bandage as told by your provider. Ask what things are safe for you to do at home. Ask when  you can go back to work or school. Do not take baths, swim, or use a hot tub until you're told it's OK. Ask if you can shower. Check your IV insertion site every day for signs of infection. Check for: Redness, swelling, or pain. Fluid or blood. If fluid or blood drains from your IV site, use your hands to press down firmly on the area for a minute or two. Doing this should stop the bleeding. Warmth. Pus or a bad smell. Contact a health care provider if: You have signs of infection around your IV site.  You have fluid or blood coming from your IV site that does not stop after you put pressure to the site. You have a rash or blisters. You have itchy, red, swollen areas of skin called hives. Get help right away if: You have a fever or chills. You have chest pain. You have trouble breathing. This information is not intended to replace advice given to you by your health care provider. Make sure you discuss any questions you have with your health care provider. Document Revised: 10/20/2022 Document Reviewed: 10/20/2022 Elsevier Patient Education  2024 [employer].

## 2023-09-16 ENCOUNTER — Other Ambulatory Visit: Payer: Self-pay

## 2023-09-16 ENCOUNTER — Ambulatory Visit
Admission: RE | Admit: 2023-09-16 | Discharge: 2023-09-16 | Disposition: A | Discharge status: Home / Self Care | Source: Ambulatory Visit | Attending: Radiation Oncology | Admitting: Radiation Oncology

## 2023-09-16 DIAGNOSIS — Z510 Encounter for antineoplastic radiation therapy: Secondary | ICD-10-CM | POA: Diagnosis not present

## 2023-09-16 LAB — RAD ONC ARIA SESSION SUMMARY
Course Elapsed Days: 66
Plan Fractions Treated to Date: 35
Plan Prescribed Dose Per Fraction: 2 Gy
Plan Total Fractions Prescribed: 35
Plan Total Prescribed Dose: 70 Gy
Reference Point Dosage Given to Date: 70 Gy
Reference Point Session Dosage Given: 2 Gy
Session Number: 35

## 2023-09-17 NOTE — Radiation Completion Notes (Signed)
 Patient Name: Ricky [name], Ricky [name] MRN: [id number] Date of Birth: 11/23/1955 Referring Physician: BERT KLEIN, M.D. Date of Service: 2023-09-17 Radiation Oncologist: Glenis Langdon, M.D. 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  He was found to have a right glossotonsillar mass.  CT scan of the head and neck showed a 4 cm partially necrotic mass of the consult glossotonsillar sulcus on the right.  There was adjacent lymphadenopathy at level 2 nodes measuring approximately 15 mm.  There is also a second pathologic node just lateral.  He has a PET CT scan pending.  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  He has a PET CT scan planned for Monday.  Again complains of some dysphagia oral pain and is currently on narcotic analgesics.      ==========DELIVERED PLANS==========  First Treatment Date: 2023-07-12 Last Treatment Date: 2023-09-16   Plan Name: HN_Pharynx Site: Pharynx Technique: IMRT Mode: Photon Dose Per Fraction: 2 Gy Prescribed Dose (Delivered / Prescribed): 70 Gy / 70 Gy Prescribed Fxs (Delivered / Prescribed): 35 / 35     ==========ON TREATMENT VISIT DATES========== 2023-07-13, 2023-07-20, 2023-07-27, 2023-08-03, 2023-08-09, 2023-08-17, 2023-08-24, 2023-09-02, 2023-09-07, 2023-09-14     ==========UPCOMING VISITS========== 11/23/2023 [facility]PET CT NM PET WB & SB TO MID THIGH [facility] INFUSION  11/03/2023 [facility] [address], [place]  10/21/2023 [facility] [address]  10/20/2023 [facility] RAD ONCOLOGY FOLLOW UP 30 Glenis Langdon, [place]  10/07/2023 [facility] FOLLOW UP 30 Todd Fossa, MD  09/22/2023 [facility] [address], [place]  09/21/2023 [facility] ONC INFUSION 2HR (120) [facility]  09/21/2023 [facility] ONC PORT FLUSH W/LAB [facility]  09/21/2023 [facility] ONC NUT [address], [address]  09/20/2023 [facility] INFUSION 2HR (120) [facility] 4  09/20/2023 [facility] ONC EST PT [address], [place]  09/20/2023 [facility] ONC PORT FLUSH W/LAB [facility]        ==========APPENDIX - ON TREATMENT VISIT NOTES==========   See weekly On Treatment Notes in Epic for details in the Media tab (listed as Progress notes on the On Treatment Visit Dates listed above).

## 2023-09-20 ENCOUNTER — Inpatient Hospital Stay

## 2023-09-20 ENCOUNTER — Inpatient Hospital Stay: Admitting: Oncology

## 2023-09-20 ENCOUNTER — Other Ambulatory Visit: Payer: Self-pay

## 2023-09-20 DIAGNOSIS — C109 Malignant neoplasm of oropharynx, unspecified: Secondary | ICD-10-CM

## 2023-09-21 ENCOUNTER — Inpatient Hospital Stay

## 2023-09-21 NOTE — Progress Notes (Signed)
 Nutrition  Wife left RD message yesterday (Monday, 5/12)saying that telephone nutrition appointment planned for today needed to be rescheduled.  RD at other clinic on Mondays and just getting messages.    Called wife back and left message.  Appointment has already been moved to Wednesday, 5/14 and RD will see patient in clinic.  Winta Barcelo B. Zollie Hipp, CSO, LDN Registered Dietitian [phone]

## 2023-09-22 ENCOUNTER — Inpatient Hospital Stay (HOSPITAL_BASED_OUTPATIENT_CLINIC_OR_DEPARTMENT_OTHER): Admitting: Hospice and Palliative Medicine

## 2023-09-22 ENCOUNTER — Inpatient Hospital Stay

## 2023-09-22 ENCOUNTER — Ambulatory Visit: Admitting: Licensed Clinical Social Worker

## 2023-09-22 ENCOUNTER — Other Ambulatory Visit: Payer: Self-pay

## 2023-09-22 ENCOUNTER — Telehealth: Payer: Self-pay | Admitting: *Deleted

## 2023-09-22 ENCOUNTER — Other Ambulatory Visit: Payer: Self-pay | Admitting: *Deleted

## 2023-09-22 VITALS — BP 104/65 | HR 84 | Temp 97.0°F | Resp 16

## 2023-09-22 DIAGNOSIS — C109 Malignant neoplasm of oropharynx, unspecified: Secondary | ICD-10-CM | POA: Diagnosis not present

## 2023-09-22 DIAGNOSIS — Z95828 Presence of other vascular implants and grafts: Secondary | ICD-10-CM

## 2023-09-22 DIAGNOSIS — Z5111 Encounter for antineoplastic chemotherapy: Secondary | ICD-10-CM | POA: Diagnosis not present

## 2023-09-22 DIAGNOSIS — R252 Cramp and spasm: Secondary | ICD-10-CM

## 2023-09-22 DIAGNOSIS — E639 Nutritional deficiency, unspecified: Secondary | ICD-10-CM

## 2023-09-22 DIAGNOSIS — E871 Hypo-osmolality and hyponatremia: Secondary | ICD-10-CM

## 2023-09-22 LAB — CBC WITH DIFFERENTIAL (CANCER CENTER ONLY)
Abs Immature Granulocytes: 0.04 10*3/uL (ref 0.00–0.07)
Basophils Absolute: 0 10*3/uL (ref 0.0–0.1)
Basophils Relative: 1 %
Eosinophils Absolute: 0 10*3/uL (ref 0.0–0.5)
Eosinophils Relative: 1 %
HCT: 21.2 % — ABNORMAL LOW (ref 39.0–52.0)
Hemoglobin: 7.5 g/dL — ABNORMAL LOW (ref 13.0–17.0)
Immature Granulocytes: 1 %
Lymphocytes Relative: 8 %
Lymphs Abs: 0.3 10*3/uL — ABNORMAL LOW (ref 0.7–4.0)
MCH: 31.1 pg (ref 26.0–34.0)
MCHC: 35.4 g/dL (ref 30.0–36.0)
MCV: 88 fL (ref 80.0–100.0)
Monocytes Absolute: 0.3 10*3/uL (ref 0.1–1.0)
Monocytes Relative: 9 %
Neutro Abs: 3.1 10*3/uL (ref 1.7–7.7)
Neutrophils Relative %: 80 %
Platelet Count: 105 10*3/uL — ABNORMAL LOW (ref 150–400)
RBC: 2.41 MIL/uL — ABNORMAL LOW (ref 4.22–5.81)
RDW: 16.3 % — ABNORMAL HIGH (ref 11.5–15.5)
WBC Count: 3.8 10*3/uL — ABNORMAL LOW (ref 4.0–10.5)
nRBC: 0 % (ref 0.0–0.2)

## 2023-09-22 LAB — BASIC METABOLIC PANEL - CANCER CENTER ONLY
Anion gap: 12 (ref 5–15)
BUN: 21 mg/dL (ref 8–23)
CO2: 24 mmol/L (ref 22–32)
Calcium: 8.5 mg/dL — ABNORMAL LOW (ref 8.9–10.3)
Chloride: 98 mmol/L (ref 98–111)
Creatinine: 1.83 mg/dL — ABNORMAL HIGH (ref 0.61–1.24)
GFR, Estimated: 40 mL/min — ABNORMAL LOW (ref >60)
Glucose, Bld: 128 mg/dL — ABNORMAL HIGH (ref 70–99)
Potassium: 3.8 mmol/L (ref 3.5–5.1)
Sodium: 134 mmol/L — ABNORMAL LOW (ref 135–145)

## 2023-09-22 MED ORDER — HEPARIN SOD (PORK) LOCK FLUSH 100 UNIT/ML IV SOLN
500.0000 [IU] | Freq: Once | INTRAVENOUS | Status: AC
  Administered 2023-09-22: 500 [IU]
  Filled 2023-09-22: qty 5

## 2023-09-22 MED ORDER — SODIUM CHLORIDE 0.9 % IV SOLN
INTRAVENOUS | Status: DC
  Filled 2023-09-22 (×2): qty 250

## 2023-09-22 NOTE — Progress Notes (Addendum)
 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  Patient receiving chemotherapy and radiation.  Last radiation on 5/8.    Met with patient during infusion and then in lobby with wife Ricky [name].  Patient says that he ate a hamburger and milkshake recently.  Says that he might have gotten his days mixed up and that Ricky [name] would know what he ate.  Drinking water  and gatorade.  Appetite poor and taste alterations continue.    Spoke with Ricky [name] in waiting room and she reports that he only ate 2 eggs yesterday and 1/2 oreo milkshake.  She is concerned about his nutrition and asking about feeding tube.      Medications: dexamethasone   Labs: Na 134, glucose 128, creatinine 1.83, hgb 7.5  Anthropometrics:   Weight 155 lb on 5/6 158 lb 2.9 oz on 4/29 157 lb on 4/15 162 lb 7.7 oz on 4/4 165 lb 4.8 oz on 3/17 169 lb on 3/7 177 lb on 2/13   NUTRITION DIAGNOSIS: Inadequate oral intake continues    INTERVENTION:  Patient will be seen in [facility] today to discuss feeding tube placement.   Encouraged high calorie and protein foods Continue dexamethasone      MONITORING, EVALUATION, GOAL: weight trends, intake   NEXT VISIT: Wednesday, May 21 during fluids Unless sooner if agreeable to feeding tube  Ricky [name], CSO, LDN Registered Dietitian [phone]

## 2023-09-22 NOTE — Progress Notes (Signed)
 Symptom Management Clinic [facility] at [facility] [phone] [phone]  Patient Care Team: Melchor Spoon, MD as PCP - General (Internal Medicine) Avonne Boettcher, MD as Consulting Physician (Oncology)   NAME OF PATIENT: Ricky [name]  [id number]  May 01, 1956   DATE OF VISIT: 09/22/23  REASON FOR CONSULT: Asia Connley is a 68 y.o. 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   INTERVAL HISTORY: Patient saw Dr. Randy Buttery on 09/14/2023.  Patient status post concurrent chemoradiation.  He received cycle 7 cisplatin  on 09/14/2023.  He presented to clinic today for labs/fluids.  Patient seen by nutrition for persistent weight loss and poor oral intake.  He was added on to [facility] to address possible feeding tube.  Patient confirms poor oral intake.  Pain is stable to slightly improved on oxycodone /fentanyl .  He continues to have chronic fatigue but otherwise denies significant changes.  Denies any neurologic complaints. Denies recent fevers or illnesses. Denies any easy bleeding or bruising. Denies chest pain. Denies any nausea, vomiting, constipation, or diarrhea. Denies urinary complaints. Patient offers no further specific complaints today.  PAST MEDICAL HISTORY: Past Medical History:  Diagnosis Date   Anxiety    Arthritis    Carpal tunnel syndrome    Depression    Diabetes (HCC)    Hyperlipidemia    Hypertension    Loss of teeth due to extraction    Neoplasm related pain    Parkinson's disease (HCC)     PAST SURGICAL HISTORY:  Past Surgical History:  Procedure Laterality Date   CARPAL TUNNEL RELEASE Right 11/28/2020   Procedure: CARPAL TUNNEL RELEASE ENDOSCOPIC;  Surgeon: Elner Hahn, MD;  Location: [facility] ORS;  Service: Orthopedics;  Laterality: Right;   CARPAL TUNNEL RELEASE Left 01/22/2021   Procedure: CARPAL TUNNEL RELEASE ENDOSCOPIC;  Surgeon: Elner Hahn,  MD;  Location: [facility] ORS;  Service: Orthopedics;  Laterality: Left;   IR IMAGING GUIDED PORT INSERTION  06/30/2023   KNEE ARTHROSCOPY Right     HEMATOLOGY/ONCOLOGY HISTORY:  Oncology History  Squamous cell carcinoma of oropharynx (HCC)  06/22/2023 Initial Diagnosis   Squamous cell carcinoma of oropharynx (HCC)   06/22/2023 Cancer Staging   Staging form: Pharynx - HPV-Mediated Oropharynx, AJCC 8th Edition - Clinical stage from 06/22/2023: Stage I (cT2, cN1, cM0, p16+) - Signed by Avonne Boettcher, MD on 07/12/2023 Stage prefix: Initial diagnosis   07/12/2023 -  Chemotherapy   Patient is on Treatment Plan : HEAD/NECK Cisplatin  (40) q7d       ALLERGIES:  is allergic to mirtazapine and other.  MEDICATIONS:  Current Outpatient Medications  Medication Sig Dispense Refill   ACCU-CHEK GUIDE TEST test strip TEST 2X DAILY     Accu-Chek Softclix Lancets lancets 2 (two) times daily.     ARIPiprazole  (ABILIFY ) 30 MG tablet Take 30 mg by mouth daily.     atenolol (TENORMIN) 50 MG tablet Take 50 mg by mouth daily.     Blood Glucose Monitoring Suppl (ACCU-CHEK GUIDE ME) w/Device KIT See admin instructions.     buPROPion  (WELLBUTRIN  XL) 150 MG 24 hr tablet Take 1 tablet (150 mg total) by mouth daily. Total of 450 mg daily. Take along with 300 mg tab 30 tablet 5   buPROPion  (WELLBUTRIN  XL) 150 MG 24 hr tablet Take by mouth.     buPROPion  (WELLBUTRIN  XL) 300 MG 24 hr tablet Take 1 tablet (300 mg total) by  mouth daily. Total of 450 mg daily. Take along with 150 mg tab 30 tablet 5   busPIRone  (BUSPAR ) 7.5 MG tablet Take 1 tablet (7.5 mg total) by mouth 2 (two) times daily. 60 tablet 3   calcium  carbonate (CALCIUM  600) 600 MG TABS tablet Take 1 tablet (600 mg total) by mouth daily. For low calcium  60 tablet 0   carbidopa -levodopa  (SINEMET  IR) 25-100 MG tablet Take 1 tablet by mouth 5 (five) times daily. 3 in morning 2 in evening     clonazePAM  (KLONOPIN ) 1 MG tablet Take 1 tablet (1 mg total) by mouth 3 (three)  times daily as needed for anxiety. 90 tablet 0   dexamethasone  (DECADRON ) 2 MG tablet Take 1 tablet by mouth daily x 1 week, then take 1/2 tablet daily x 1 week. Do not abruptly stop. 14 tablet 1   dexamethasone  (DECADRON ) 2 MG tablet Take 1 tablet (2 mg total) by mouth daily. 5 tablet 0   fentaNYL  (DURAGESIC ) 25 MCG/HR Place 1 patch onto the skin every 3 (three) days. 10 patch 0   folic acid  (FOLVITE ) 1 MG tablet Take 1 tablet (1 mg total) by mouth daily. 30 tablet 3   ketoconazole (NIZORAL) 2 % shampoo Apply 1 application  topically every other day.     lidocaine -prilocaine  (EMLA ) cream Apply to affected area once 30 g 3   magic mouthwash (multi-ingredient) oral suspension Swish and swallow 5-10 mLs by mouth 4 (four) times daily. 480 mL 3   magic mouthwash w/lidocaine  SOLN Take 5-10 mLs by mouth 4 (four) times daily as needed for mouth pain. Suspension contains equal amounts of Maalox Extra Strength, nystatin , diphenhydramine and lidocaine . (Patient not taking: Reported on 09/14/2023) 480 mL 3   magnesium  chloride (SLOW-MAG) 64 MG TBEC SR tablet Take 2 tablets (128 mg total) by mouth daily. For low magnesium  120 tablet 00   metFORMIN  (GLUCOPHAGE ) 500 MG tablet Take 500 mg by mouth daily with breakfast.     nystatin  (MYCOSTATIN ) 100000 UNIT/ML suspension Take 5 mLs (500,000 Units total) by mouth 4 (four) times daily. 60 mL 0   ondansetron  (ZOFRAN ) 8 MG tablet Take 1 tablet (8 mg total) by mouth every 8 (eight) hours as needed for nausea or vomiting. Start on the third day after cisplatin . 30 tablet 1   Oxycodone  HCl 10 MG TABS Take 1 tablet (10 mg total) by mouth every 4 (four) hours as needed. 90 tablet 0   prochlorperazine  (COMPAZINE ) 10 MG tablet Take 1 tablet (10 mg total) by mouth every 6 (six) hours as needed (Nausea or vomiting). 30 tablet 1   rosuvastatin  (CRESTOR ) 20 MG tablet Take 20 mg by mouth daily.     sertraline  (ZOLOFT ) 100 MG tablet Take 1.5 tablets (150 mg total) by mouth at bedtime.  45 tablet 3   sildenafil (VIAGRA) 50 MG tablet Take 50 mg by mouth daily as needed for erectile dysfunction. (Patient not taking: Reported on 08/30/2023)     sucralfate  (CARAFATE ) 1 g tablet Take 1 tablet (1 g total) by mouth 3 (three) times daily before meals. Dissolve in 4 tbs of warm water , swish and swallow 90 tablet 1   tadalafil (CIALIS) 5 MG tablet Take 5 mg by mouth daily. (Patient not taking: Reported on 09/14/2023)     tamsulosin (FLOMAX) 0.4 MG CAPS capsule Take by mouth.     traZODone  (DESYREL ) 50 MG tablet Take 0.5-2 tablets (25-100 mg total) by mouth at bedtime as needed for sleep. 60 tablet 1  No current facility-administered medications for this visit.    VITAL SIGNS: There were no vitals taken for this visit. There were no vitals filed for this visit.   Estimated body mass index is 22.9 kg/m as calculated from the following:   Height as of 08/16/23: 5\' 9"  (1.753 m).   Weight as of 09/14/23: 155 lb 1.6 oz (70.4 kg).  LABS: CBC:    Component Value Date/Time   WBC 3.8 (L) 09/22/2023 0849   WBC 2.9 (L) 08/24/2023 1055   HGB 7.5 (L) 09/22/2023 0849   HCT 21.2 (L) 09/22/2023 0849   HCT 24.4 (L) 09/14/2023 0836   PLT 105 (L) 09/22/2023 0849   MCV 88.0 09/22/2023 0849   NEUTROABS 3.1 09/22/2023 0849   LYMPHSABS 0.3 (L) 09/22/2023 0849   MONOABS 0.3 09/22/2023 0849   EOSABS 0.0 09/22/2023 0849   BASOSABS 0.0 09/22/2023 0849   Comprehensive Metabolic Panel:    Component Value Date/Time   NA 134 (L) 09/22/2023 0849   K 3.8 09/22/2023 0849   CL 98 09/22/2023 0849   CO2 24 09/22/2023 0849   BUN 21 09/22/2023 0849   CREATININE 1.83 (H) 09/22/2023 0849   GLUCOSE 128 (H) 09/22/2023 0849   CALCIUM  8.5 (L) 09/22/2023 0849   AST 12 (L) 08/24/2023 1055   AST 14 (L) 06/24/2023 1418   ALT <5 08/24/2023 1055   ALT 7 06/24/2023 1418   ALKPHOS 37 (L) 08/24/2023 1055   BILITOT 1.2 08/24/2023 1055   BILITOT 0.7 06/24/2023 1418   PROT 6.5 08/24/2023 1055   ALBUMIN 3.9 08/24/2023  1055    RADIOGRAPHIC STUDIES: No results found.   PERFORMANCE STATUS (ECOG) : 1 - Symptomatic but completely ambulatory  Review of Systems Unless otherwise noted, a complete review of systems is negative.  Physical Exam General: NAD HEENT: Right oral lesion Cardiovascular: regular rate and rhythm Pulmonary: clear ant fields Abdomen: soft, nontender, + bowel sounds GU: no suprapubic tenderness Extremities: no edema, no joint deformities Skin: no rashes Neurological: Weakness but otherwise nonfocal  IMPRESSION/PLAN: Squamous cell carcinoma of the head neck -s/p concurrent cisplatin /XRT  Fatigue/poor oral intake -patient has been previously offered PEG.  Patient did previously agree but then no showed to surgical appointment.  He has had persistent weight loss down 15 pounds over the past 2 months.  Poor oral intake is likely contributing factor to the fatigue.  I had a long conversation with patient and significant other today regarding goals.  Both were interested in pursuing PEG.  Reached out to Dr. Dana Duncan who agreed to see patient.   Neoplasm related pain -stable on current regimen of oxycodone /fentanyl .  Discussed weaning opioids as pain expectedly improves posttreatment.  Anxiety/depression-patient has chronic anxiety.  Followed by psychiatry.  Pancytopenia - likely at nadir with expected improvement in counts.   Case and plan discussed with Dr. Randy Buttery  Patient expressed understanding and was in agreement with this plan. He also understands that He can call clinic at any time with any questions, concerns, or complaints.   Thank you for allowing me to participate in the care of this very pleasant patient.   Time Total: 20 minutes  Visit consisted of counseling and education dealing with the complex and emotionally intense issues of symptom management in the setting of serious illness.Greater than 50%  of this time was spent counseling and coordinating care related to the above  assessment and plan.  Signed by: Gerilyn Kobus, PhD, NP-C

## 2023-09-22 NOTE — Progress Notes (Signed)
 Add on to see Ricky [name] to discuss dysphagia and feeding tube

## 2023-09-22 NOTE — Telephone Encounter (Signed)
 I called patient's spouse, [name], Dr. Dana Duncan could potentially see patient at 345 pm today to discuss peg tube placement. Per Wife, pt just got home and they are unable to make this appointment. She will call Dr. Oretha Birch ofice back w/in the hour to discuss a different appointmen time/date.

## 2023-09-23 ENCOUNTER — Other Ambulatory Visit: Payer: Self-pay

## 2023-09-27 ENCOUNTER — Encounter: Payer: Self-pay | Admitting: Surgery

## 2023-09-27 ENCOUNTER — Ambulatory Visit: Admitting: Surgery

## 2023-09-27 ENCOUNTER — Telehealth: Payer: Self-pay

## 2023-09-27 VITALS — BP 119/73 | HR 76 | Temp 98.1°F | Ht 69.0 in | Wt 149.8 lb

## 2023-09-27 DIAGNOSIS — R1310 Dysphagia, unspecified: Secondary | ICD-10-CM | POA: Diagnosis not present

## 2023-09-27 DIAGNOSIS — E46 Unspecified protein-calorie malnutrition: Secondary | ICD-10-CM

## 2023-09-27 DIAGNOSIS — C109 Malignant neoplasm of oropharynx, unspecified: Secondary | ICD-10-CM | POA: Diagnosis not present

## 2023-09-27 NOTE — H&P (View-Only) (Signed)
 Patient ID: Ricky [name], male   DOB: 1955/11/05, 68 y.o.   MRN: [id number]  HPI Ricky [name] is a 68 y.o. 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 He has started treatment. Decrease appetite and change in taste. Has lost 60 lbs . No fevers or chills. He Geels weak, no easy bleeding. 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 No prior abd operation Case d/w RD and Mr Borders NP in detail  HPI  Past Medical History:  Diagnosis Date   Anxiety    Arthritis    Carpal tunnel syndrome    Depression    Diabetes (HCC)    Hyperlipidemia    Hypertension    Loss of teeth due to extraction    Neoplasm related pain    Parkinson's disease [facility])     Past Surgical History:  Procedure Laterality Date   CARPAL TUNNEL RELEASE Right 11/28/2020   Procedure: CARPAL TUNNEL RELEASE ENDOSCOPIC;  Surgeon: Elner Hahn, MD;  Location: [facility] ORS;  Service: Orthopedics;  Laterality: Right;   CARPAL TUNNEL RELEASE Left 01/22/2021   Procedure: CARPAL TUNNEL RELEASE ENDOSCOPIC;  Surgeon: Elner Hahn, MD;  Location: [facility] ORS;  Service: Orthopedics;  Laterality: Left;   IR IMAGING GUIDED PORT INSERTION  06/30/2023   KNEE ARTHROSCOPY Right     Family History  Problem Relation Age of Onset   Diabetes Mother    Hypertension Mother    CVA Father    Heart attack Father    Depression Sister    Depression Brother     Social History Social History   Tobacco Use   Smoking status: Never   Smokeless tobacco: Never  Vaping Use   Vaping status: Never Used  Substance Use Topics   Alcohol use: Not Currently   Drug use: Never    Allergies  Allergen Reactions   Mirtazapine Rash   Other Itching and Rash    ETCO2 tubing. Medtronic Microstream advance    Current Outpatient Medications  Medication Sig Dispense Refill   ACCU-CHEK GUIDE TEST test strip TEST 2X DAILY      Accu-Chek Softclix Lancets lancets 2 (two) times daily.     ARIPiprazole  (ABILIFY ) 30 MG tablet Take 30 mg by mouth daily.     atenolol (TENORMIN) 50 MG tablet Take 50 mg by mouth daily.     Blood Glucose Monitoring Suppl (ACCU-CHEK GUIDE ME) w/Device KIT See admin instructions.     buPROPion  (WELLBUTRIN  XL) 150 MG 24 hr tablet Take 1 tablet (150 mg total) by mouth daily. Total of 450 mg daily. Take along with 300 mg tab 30 tablet 5   buPROPion  (WELLBUTRIN  XL) 150 MG 24 hr tablet Take by mouth.     buPROPion  (WELLBUTRIN  XL) 300 MG 24 hr tablet Take 1 tablet (300 mg total) by mouth daily. Total of 450 mg daily. Take along with 150 mg tab 30 tablet 5   busPIRone  (BUSPAR ) 7.5 MG tablet Take 1 tablet (7.5 mg total) by mouth 2 (two) times daily. 60 tablet 3   calcium  carbonate (CALCIUM  600) 600 MG TABS tablet Take 1 tablet (600 mg total) by mouth daily. For low calcium  60 tablet 0   carbidopa -levodopa  (SINEMET  IR) 25-100 MG tablet Take 1 tablet by mouth 5 (five) times daily. 3 in morning 2 in evening     clonazePAM  (KLONOPIN ) 1 MG  tablet Take 1 tablet (1 mg total) by mouth 3 (three) times daily as needed for anxiety. 90 tablet 0   dexamethasone  (DECADRON ) 2 MG tablet Take 1 tablet by mouth daily x 1 week, then take 1/2 tablet daily x 1 week. Do not abruptly stop. 14 tablet 1   dexamethasone  (DECADRON ) 2 MG tablet Take 1 tablet (2 mg total) by mouth daily. 5 tablet 0   fentaNYL  (DURAGESIC ) 25 MCG/HR Place 1 patch onto the skin every 3 (three) days. 10 patch 0   folic acid  (FOLVITE ) 1 MG tablet Take 1 tablet (1 mg total) by mouth daily. 30 tablet 3   ketoconazole (NIZORAL) 2 % shampoo Apply 1 application  topically every other day.     lidocaine -prilocaine  (EMLA ) cream Apply to affected area once 30 g 3   magic mouthwash (multi-ingredient) oral suspension Swish and swallow 5-10 mLs by mouth 4 (four) times daily. 480 mL 3   magic mouthwash w/lidocaine  SOLN Take 5-10 mLs by mouth 4 (four) times daily as  needed for mouth pain. Suspension contains equal amounts of Maalox Extra Strength, nystatin , diphenhydramine and lidocaine . (Patient not taking: Reported on 09/14/2023) 480 mL 3   magnesium  chloride (SLOW-MAG) 64 MG TBEC SR tablet Take 2 tablets (128 mg total) by mouth daily. For low magnesium  120 tablet 00   metFORMIN  (GLUCOPHAGE ) 500 MG tablet Take 500 mg by mouth daily with breakfast.     nystatin  (MYCOSTATIN ) 100000 UNIT/ML suspension Take 5 mLs (500,000 Units total) by mouth 4 (four) times daily. 60 mL 0   ondansetron  (ZOFRAN ) 8 MG tablet Take 1 tablet (8 mg total) by mouth every 8 (eight) hours as needed for nausea or vomiting. Start on the third day after cisplatin . 30 tablet 1   Oxycodone  HCl 10 MG TABS Take 1 tablet (10 mg total) by mouth every 4 (four) hours as needed. 90 tablet 0   prochlorperazine  (COMPAZINE ) 10 MG tablet Take 1 tablet (10 mg total) by mouth every 6 (six) hours as needed (Nausea or vomiting). 30 tablet 1   rosuvastatin  (CRESTOR ) 20 MG tablet Take 20 mg by mouth daily.     sertraline  (ZOLOFT ) 100 MG tablet Take 1.5 tablets (150 mg total) by mouth at bedtime. 45 tablet 3   sildenafil (VIAGRA) 50 MG tablet Take 50 mg by mouth daily as needed for erectile dysfunction. (Patient not taking: Reported on 08/30/2023)     sucralfate  (CARAFATE ) 1 g tablet Take 1 tablet (1 g total) by mouth 3 (three) times daily before meals. Dissolve in 4 tbs of warm water , swish and swallow 90 tablet 1   tadalafil (CIALIS) 5 MG tablet Take 5 mg by mouth daily. (Patient not taking: Reported on 09/14/2023)     tamsulosin (FLOMAX) 0.4 MG CAPS capsule Take by mouth.     traZODone  (DESYREL ) 50 MG tablet Take 0.5-2 tablets (25-100 mg total) by mouth at bedtime as needed for sleep. 60 tablet 1   No current facility-administered medications for this visit.     Review of Systems Full ROS  was asked and was negative except for the information on the HPI  Physical Exam There were no vitals taken for this  visit. CONSTITUTIONAL: debilitated and malnourished . EYES: Pupils are equal, round, and reactive to light, Sclera are non-icteric. EARS, NOSE, MOUTH AND THROAT: The oropharynx is clear. The oral mucosa is pink and moist. Hearing is intact to voice. LYMPH NODES:  Lymph nodes in the neck are normal. Head and Neck limited  mouth opening but still mobile, no neck masses or LAD RESPIRATORY:  Lungs are clear. There is normal respiratory effort, with equal breath sounds bilaterally, and without pathologic use of accessory muscles. CARDIOVASCULAR: Heart is regular without murmurs, gallops, or rubs. GI: The abdomen is  soft, nontender, and nondistended. There are no palpable masses. There is no hepatosplenomegaly. There are normal bowel sounds in all quadrants. GU: Rectal deferred.   MUSCULOSKELETAL: Normal muscle strength and tone. No cyanosis or edema.   SKIN: Turgor is good and there are no pathologic skin lesions or ulcers. NEUROLOGIC: Motor and sensation is grossly normal. Cranial nerves are grossly intact. PSYCH:  Oriented to person, place and time. Affect is normal.  Data Reviewed  I have personally reviewed the patient's imaging, laboratory findings and medical records.    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 D/W the pt in detail about his disease process and the benefits of G tube placement. Procedure d/w the in detail, risks, benefits and possible complications including but not limited to bleeding, infection, bowel injuries, catheter malfunction. We will type and screen given his low pl and pancytopenia.  They understand and wish to proceed  I spent 60 min in this encounter including extensive review of medical records,counseling the pt, placing orders, coordinating his care and performing documentation      Evelia Hipp, MD FACS General Surgeon 09/27/2023, 2:38 PM

## 2023-09-27 NOTE — Progress Notes (Signed)
 Patient ID: Ricky [name], male   DOB: 1955/11/05, 68 y.o.   MRN: [id number]  HPI Grayer Sproles is a 68 y.o. 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 He has started treatment. Decrease appetite and change in taste. Has lost 60 lbs . No fevers or chills. He Geels weak, no easy bleeding. 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 No prior abd operation Case d/w RD and Mr Borders NP in detail  HPI  Past Medical History:  Diagnosis Date   Anxiety    Arthritis    Carpal tunnel syndrome    Depression    Diabetes (HCC)    Hyperlipidemia    Hypertension    Loss of teeth due to extraction    Neoplasm related pain    Parkinson's disease [facility])     Past Surgical History:  Procedure Laterality Date   CARPAL TUNNEL RELEASE Right 11/28/2020   Procedure: CARPAL TUNNEL RELEASE ENDOSCOPIC;  Surgeon: Elner Hahn, MD;  Location: [facility] ORS;  Service: Orthopedics;  Laterality: Right;   CARPAL TUNNEL RELEASE Left 01/22/2021   Procedure: CARPAL TUNNEL RELEASE ENDOSCOPIC;  Surgeon: Elner Hahn, MD;  Location: [facility] ORS;  Service: Orthopedics;  Laterality: Left;   IR IMAGING GUIDED PORT INSERTION  06/30/2023   KNEE ARTHROSCOPY Right     Family History  Problem Relation Age of Onset   Diabetes Mother    Hypertension Mother    CVA Father    Heart attack Father    Depression Sister    Depression Brother     Social History Social History   Tobacco Use   Smoking status: Never   Smokeless tobacco: Never  Vaping Use   Vaping status: Never Used  Substance Use Topics   Alcohol use: Not Currently   Drug use: Never    Allergies  Allergen Reactions   Mirtazapine Rash   Other Itching and Rash    ETCO2 tubing. Medtronic Microstream advance    Current Outpatient Medications  Medication Sig Dispense Refill   ACCU-CHEK GUIDE TEST test strip TEST 2X DAILY      Accu-Chek Softclix Lancets lancets 2 (two) times daily.     ARIPiprazole  (ABILIFY ) 30 MG tablet Take 30 mg by mouth daily.     atenolol (TENORMIN) 50 MG tablet Take 50 mg by mouth daily.     Blood Glucose Monitoring Suppl (ACCU-CHEK GUIDE ME) w/Device KIT See admin instructions.     buPROPion  (WELLBUTRIN  XL) 150 MG 24 hr tablet Take 1 tablet (150 mg total) by mouth daily. Total of 450 mg daily. Take along with 300 mg tab 30 tablet 5   buPROPion  (WELLBUTRIN  XL) 150 MG 24 hr tablet Take by mouth.     buPROPion  (WELLBUTRIN  XL) 300 MG 24 hr tablet Take 1 tablet (300 mg total) by mouth daily. Total of 450 mg daily. Take along with 150 mg tab 30 tablet 5   busPIRone  (BUSPAR ) 7.5 MG tablet Take 1 tablet (7.5 mg total) by mouth 2 (two) times daily. 60 tablet 3   calcium  carbonate (CALCIUM  600) 600 MG TABS tablet Take 1 tablet (600 mg total) by mouth daily. For low calcium  60 tablet 0   carbidopa -levodopa  (SINEMET  IR) 25-100 MG tablet Take 1 tablet by mouth 5 (five) times daily. 3 in morning 2 in evening     clonazePAM  (KLONOPIN ) 1 MG  tablet Take 1 tablet (1 mg total) by mouth 3 (three) times daily as needed for anxiety. 90 tablet 0   dexamethasone  (DECADRON ) 2 MG tablet Take 1 tablet by mouth daily x 1 week, then take 1/2 tablet daily x 1 week. Do not abruptly stop. 14 tablet 1   dexamethasone  (DECADRON ) 2 MG tablet Take 1 tablet (2 mg total) by mouth daily. 5 tablet 0   fentaNYL  (DURAGESIC ) 25 MCG/HR Place 1 patch onto the skin every 3 (three) days. 10 patch 0   folic acid  (FOLVITE ) 1 MG tablet Take 1 tablet (1 mg total) by mouth daily. 30 tablet 3   ketoconazole (NIZORAL) 2 % shampoo Apply 1 application  topically every other day.     lidocaine -prilocaine  (EMLA ) cream Apply to affected area once 30 g 3   magic mouthwash (multi-ingredient) oral suspension Swish and swallow 5-10 mLs by mouth 4 (four) times daily. 480 mL 3   magic mouthwash w/lidocaine  SOLN Take 5-10 mLs by mouth 4 (four) times daily as  needed for mouth pain. Suspension contains equal amounts of Maalox Extra Strength, nystatin , diphenhydramine and lidocaine . (Patient not taking: Reported on 09/14/2023) 480 mL 3   magnesium  chloride (SLOW-MAG) 64 MG TBEC SR tablet Take 2 tablets (128 mg total) by mouth daily. For low magnesium  120 tablet 00   metFORMIN  (GLUCOPHAGE ) 500 MG tablet Take 500 mg by mouth daily with breakfast.     nystatin  (MYCOSTATIN ) 100000 UNIT/ML suspension Take 5 mLs (500,000 Units total) by mouth 4 (four) times daily. 60 mL 0   ondansetron  (ZOFRAN ) 8 MG tablet Take 1 tablet (8 mg total) by mouth every 8 (eight) hours as needed for nausea or vomiting. Start on the third day after cisplatin . 30 tablet 1   Oxycodone  HCl 10 MG TABS Take 1 tablet (10 mg total) by mouth every 4 (four) hours as needed. 90 tablet 0   prochlorperazine  (COMPAZINE ) 10 MG tablet Take 1 tablet (10 mg total) by mouth every 6 (six) hours as needed (Nausea or vomiting). 30 tablet 1   rosuvastatin  (CRESTOR ) 20 MG tablet Take 20 mg by mouth daily.     sertraline  (ZOLOFT ) 100 MG tablet Take 1.5 tablets (150 mg total) by mouth at bedtime. 45 tablet 3   sildenafil (VIAGRA) 50 MG tablet Take 50 mg by mouth daily as needed for erectile dysfunction. (Patient not taking: Reported on 08/30/2023)     sucralfate  (CARAFATE ) 1 g tablet Take 1 tablet (1 g total) by mouth 3 (three) times daily before meals. Dissolve in 4 tbs of warm water , swish and swallow 90 tablet 1   tadalafil (CIALIS) 5 MG tablet Take 5 mg by mouth daily. (Patient not taking: Reported on 09/14/2023)     tamsulosin (FLOMAX) 0.4 MG CAPS capsule Take by mouth.     traZODone  (DESYREL ) 50 MG tablet Take 0.5-2 tablets (25-100 mg total) by mouth at bedtime as needed for sleep. 60 tablet 1   No current facility-administered medications for this visit.     Review of Systems Full ROS  was asked and was negative except for the information on the HPI  Physical Exam There were no vitals taken for this  visit. CONSTITUTIONAL: debilitated and malnourished . EYES: Pupils are equal, round, and reactive to light, Sclera are non-icteric. EARS, NOSE, MOUTH AND THROAT: The oropharynx is clear. The oral mucosa is pink and moist. Hearing is intact to voice. LYMPH NODES:  Lymph nodes in the neck are normal. Head and Neck limited  mouth opening but still mobile, no neck masses or LAD RESPIRATORY:  Lungs are clear. There is normal respiratory effort, with equal breath sounds bilaterally, and without pathologic use of accessory muscles. CARDIOVASCULAR: Heart is regular without murmurs, gallops, or rubs. GI: The abdomen is  soft, nontender, and nondistended. There are no palpable masses. There is no hepatosplenomegaly. There are normal bowel sounds in all quadrants. GU: Rectal deferred.   MUSCULOSKELETAL: Normal muscle strength and tone. No cyanosis or edema.   SKIN: Turgor is good and there are no pathologic skin lesions or ulcers. NEUROLOGIC: Motor and sensation is grossly normal. Cranial nerves are grossly intact. PSYCH:  Oriented to person, place and time. Affect is normal.  Data Reviewed  I have personally reviewed the patient's imaging, laboratory findings and medical records.    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 D/W the pt in detail about his disease process and the benefits of G tube placement. Procedure d/w the in detail, risks, benefits and possible complications including but not limited to bleeding, infection, bowel injuries, catheter malfunction. We will type and screen given his low pl and pancytopenia.  They understand and wish to proceed  I spent 60 min in this encounter including extensive review of medical records,counseling the pt, placing orders, coordinating his care and performing documentation      Evelia Hipp, MD FACS General Surgeon 09/27/2023, 2:38 PM

## 2023-09-27 NOTE — Patient Instructions (Signed)
 You have requested to have a feeding tube placed. This will be done at [facility] with Dr. Dana Duncan.  You will return after your post-op appointment with a lifting restriction for approximately 4 more weeks.  Please see the (blue)pre-care form that you have been given today. Our surgery scheduler will call you to verify surgery date and to go over information.   If you have any questions, please call our office.    How to Care for a Feeding Tube A feeding tube is a soft, flexible tube used to give medicine, water , and liquid food. A person may need a feeding tube if they have trouble swallowing or cannot have food or medicine by mouth. The tube is put right into the stomach.  The following information gives steps on how to care for the skin around a feeding tube, or the tube site. This information is meant for adults and children over 1 year of age. Supplies needed: Clean washcloth, gauze pads, or soft paper towels. Cotton swabs. Skin barrier ointment or cream, such as petroleum jelly. Soap and water , or sterile saline. Pre-cut foam pads or gauze for around the tube. Medical tape. Anchoring device. This is not always used. Syringe. Cleaning brush or toothbrush. This is only used for cleanings. How to care for the tube site  Have all supplies ready and near you. Wash your hands with soap and water  for at least 20 seconds. Remove the foam pad or gauze under the tube stabilizing disc (bumper), if there is one. You may have to do this a few times a day right after the feeding tube is put in because the gauze or pad will get soiled or wet. As the site heals, you may not have to replace the pads or gauze as often. But you still need to clean and check the area every day. Gently turn the bumper so it does not stick to the skin. Check the skin around the tube site for redness, rash, swelling, drainage, or growth of extra tissue. Check the number on the tube (guide mark) where it meets the  skin. It should not change. If it does, the feeding tube might be coming out. Moisten gauze pads and cotton swabs with water  and soap, or saline. Take the moistenedcotton swab and wipe under the bumper, right near the opening in the belly (stoma). Take the moistened gauze pad and clean the skin around the tube site. If you used soap, rinse with water . Use a washcloth, dry gauze pad, or soft paper towel to dry the skin and stoma site. Dry the tube and bumper too. If the skin is red, put a barrier cream or ointment on a cotton swab. Apply it around the site, under the bumper. This will help the site heal. Put a new pre-cut foam pad or gauze around the tube, under the bumper. If the site is healed and there is no drainage, you can leave off the foam pads or gauze. Put tape around the edges of the foam pad or gauze to keep it in place. Use tape or an anchoring device to hold the loose end of the tube against the skin. This helps keep the tube from getting pulled on. Change where you put the tape to avoid damaging the skin. Throw away used supplies. Wash your hands with soap and water  for at least 20 seconds. How to care for the moat If the end of the feeding tube has a moat, clean it once a day or  as told. The moat is the open space inside the feeding tube connector. Follow the manufacturer's instructions on how to clean the moat. You may be told to: Remove the cap from the end of the feeding tube. Cover the hole in the middle of the tube port with a cleaning brush. Hold the end of the tube over a deep bowl, or wrap it with paper towels or a washcloth to soak up the water . Use a syringe of water  to flush out the moat. Use the cleaning brush or toothbrush to clean the tube cap and around the inside of the moat. This brush can only be used for tube cleanings. Dry the end of the feeding tube and the cap with gauze or paper towel. Put the cap back on the feeding tube. General tips Use, clean, and reuse  feeding tube equipment only as told by the health care provider. Do not use antibiotic ointment or cream on the feeding tube site unless you're told to. Contact a health care provider if: You notice a change in the guide marks on the feeding tube where it meets the skin. Changes could mean the feeding tube has moved or is coming out. You see any of these on the skin around the tube site: Redness. Rash. Swelling. Drainage. Extra growth of tissue. You have questions or concerns about the feeding tube or the tube site. This information is not intended to replace advice given to you by your health care provider. Make sure you discuss any questions you have with your health care provider. Document Revised: 08/27/2022 Document Reviewed: 08/27/2022 Elsevier Patient Education  2024 [employer].

## 2023-09-27 NOTE — Telephone Encounter (Signed)
 Nutrition  Called and left message for [name] reminding of nutrition appointment on 5/21 to discuss new feeding tube.    Delissa Silba B. Zollie Hipp, CSO, LDN Registered Dietitian [phone]

## 2023-09-28 ENCOUNTER — Telehealth: Payer: Self-pay | Admitting: *Deleted

## 2023-09-28 ENCOUNTER — Telehealth: Payer: Self-pay | Admitting: Surgery

## 2023-09-28 ENCOUNTER — Other Ambulatory Visit: Payer: Self-pay

## 2023-09-28 ENCOUNTER — Ambulatory Visit (INDEPENDENT_AMBULATORY_CARE_PROVIDER_SITE_OTHER): Admitting: Licensed Clinical Social Worker

## 2023-09-28 DIAGNOSIS — F331 Major depressive disorder, recurrent, moderate: Secondary | ICD-10-CM

## 2023-09-28 DIAGNOSIS — F411 Generalized anxiety disorder: Secondary | ICD-10-CM

## 2023-09-28 DIAGNOSIS — C109 Malignant neoplasm of oropharynx, unspecified: Secondary | ICD-10-CM

## 2023-09-28 DIAGNOSIS — F4010 Social phobia, unspecified: Secondary | ICD-10-CM | POA: Diagnosis not present

## 2023-09-28 NOTE — Telephone Encounter (Signed)
 Patient has been advised of Pre-Admission date/time, and Surgery date at [facility].  Surgery Date: 09/30/23 Preadmission Testing Date: 09/29/23 (phone 1p-4p)  Patient informed of the scheduling process and surgery information given at time of office visit.  Patient has been made aware to call [phone], between 1-3:00pm the day before surgery, to find out what time to arrive for surgery.

## 2023-09-28 NOTE — Progress Notes (Signed)
 THERAPIST PROGRESS NOTE  Session Time: 9-9:53am  Participation Level: Active  Behavioral Response: CasualAlertDepressed  Type of Therapy: Individual Therapy  Treatment Goals addressed:  Goal: LTG: Report a decrease in anxiety symptoms as evidenced by an overall reduction in anxiety score by a minimum of 25% on the Generalized Anxiety Disorder Scale (GAD-7)     Dates: Start:  09/08/23    Expected End:  03/09/24       Disciplines: Interdisciplinary, PROVIDER             Goal: STG - Misc 1     Dates: Start:  09/08/23    Expected End:  03/09/24       Description: Identify and Challenge Three Negative Thoughts: Become aware of distorted or unhelpful thinking patterns and replace them with more balanced and constructive thoughts.    Disciplines: Counselor      ProgressTowards Goals: Progressing  Interventions: CBT, Supportive, and Reframing  Summary: Ricky [name] is a 68 y.o. male who presents with lack of motivation, anhedonia, feelings of anxiety, tension, a negative outlook, and uncontrollable worry. Pt was oriented times 5. Pt was cooperative and engaged. Pt denies SI/HI/AVH.     The patient utilized therapeutic space to process anxiety and depressive symptoms resulting from daily cancer treatment over the past two months. He attributes this treatment time to consistent worrying, which he distracts himself from by listening to other patients undergoing treatment. Additionally, the patient experiences anxiety related to not knowing his prognosis for three months after treatment. He reports spending a significant amount of time thinking about his father's death and has regrets about how he spent their final months together. The patient thinks about his father every day and worries about supporting his mother should he pass before her.  The clinician worked with the patient on problem-solving ways to communicate his fears with his siblings and wife regarding responsibilities for  caring for his mother, while also discussing his worst-case scenario should his health deteriorate. The clinician assessed the patient's stages of grief related to his father's death, as well as the grieving process for the life he envisioned before his cancer diagnosis. The patient reflected on his journey toward acceptance and on aspects of his situation that he cannot control. He also recalled previous coping techniques, such as watching TV, working, and riding his motorcycle, which serve as positive distractions. The clinician assisted the patient in identifying factors within his control.  Furthermore, the patient reflected on history of anxiety related to socialization and his previous diagnosis of social anxiety. He reports that since early adulthood, he has experienced a lack of motivation and anxiety around others. The patient identifies episodes of severe social anxiety that have kept him confined to his home for extended periods--sometimes up to nine months--due to a fear of leaving.  Suicidal/Homicidal: Nowithout intent/plan  Therapist Response: Clinician utilized active and supportive reflection to create a safe space for patient to process recent events.  Clinician assessed for current symptoms, stressors, seeking support session.  Clinician continue to utilize session to build rapport with patient as well as assess his current presentation of anxious symptoms.  Plan: Return again in 4 weeks.  Diagnosis: MDD (major depressive disorder), recurrent episode, moderate (HCC)  Social anxiety disorder  Anxiety state   Collaboration of Care: AEB psychiatrist can access notes and cln. Will review psychiatrists' notes. Check in with the patient and will see LCSW per availability. Patient agreed with treatment recommendations.   Patient/Guardian was advised Release  of Information must be obtained prior to any record release in order to collaborate their care with an outside provider.  Patient/Guardian was advised if they have not already done so to contact the registration department to sign all necessary forms in order for us  to release information regarding their care.   Consent: Patient/Guardian gives verbal consent for treatment and assignment of benefits for services provided during this visit. Patient/Guardian expressed understanding and agreed to proceed.   Marvin Slot, LCSW 09/28/2023

## 2023-09-28 NOTE — Telephone Encounter (Signed)
-----   Message from Peggyann Bower sent at 09/28/2023 11:17 AM EDT ----- Sure thing. Pattie Borders - will you please place lab orders. ----- Message ----- From: Lander Pines, RD Sent: 09/28/2023  10:52 AM EDT To: Peggyann Bower, NP; Avonne Boettcher, MD; #  Dr Randy Buttery and Jerrilyn Moras,  Mr Aquilla Bayley is coming tomorrow to see me, labs and possible fluids.  I am assuming CMP will be drawn tomorrow.  Can Magnesium  and phosphorus be added as well to tomorrow's labs?  Thanks, Joli

## 2023-09-29 ENCOUNTER — Inpatient Hospital Stay

## 2023-09-29 ENCOUNTER — Encounter: Payer: Self-pay | Admitting: Hospice and Palliative Medicine

## 2023-09-29 ENCOUNTER — Other Ambulatory Visit: Payer: Self-pay | Admitting: *Deleted

## 2023-09-29 ENCOUNTER — Other Ambulatory Visit: Payer: Self-pay | Admitting: Oncology

## 2023-09-29 ENCOUNTER — Other Ambulatory Visit: Payer: Self-pay

## 2023-09-29 ENCOUNTER — Other Ambulatory Visit: Payer: Self-pay | Admitting: Radiation Oncology

## 2023-09-29 ENCOUNTER — Inpatient Hospital Stay (HOSPITAL_BASED_OUTPATIENT_CLINIC_OR_DEPARTMENT_OTHER): Admitting: Hospice and Palliative Medicine

## 2023-09-29 ENCOUNTER — Encounter
Admission: RE | Admit: 2023-09-29 | Discharge: 2023-09-29 | Disposition: A | Discharge status: Home / Self Care | Source: Ambulatory Visit | Attending: Surgery | Admitting: Surgery

## 2023-09-29 VITALS — BP 124/80 | HR 76 | Temp 97.9°F | Resp 18

## 2023-09-29 DIAGNOSIS — E860 Dehydration: Secondary | ICD-10-CM

## 2023-09-29 DIAGNOSIS — E871 Hypo-osmolality and hyponatremia: Secondary | ICD-10-CM

## 2023-09-29 DIAGNOSIS — C109 Malignant neoplasm of oropharynx, unspecified: Secondary | ICD-10-CM

## 2023-09-29 DIAGNOSIS — I10 Essential (primary) hypertension: Secondary | ICD-10-CM

## 2023-09-29 DIAGNOSIS — Z789 Other specified health status: Secondary | ICD-10-CM

## 2023-09-29 DIAGNOSIS — Z5111 Encounter for antineoplastic chemotherapy: Secondary | ICD-10-CM | POA: Diagnosis not present

## 2023-09-29 DIAGNOSIS — E119 Type 2 diabetes mellitus without complications: Secondary | ICD-10-CM

## 2023-09-29 HISTORY — DX: Anemia, unspecified: D64.9

## 2023-09-29 LAB — CMP (CANCER CENTER ONLY)
ALT: <5 U/L (ref 0–44)
AST: 15 U/L (ref 15–41)
Albumin: 4.1 g/dL (ref 3.5–5.0)
Alkaline Phosphatase: 38 U/L (ref 38–126)
Anion gap: 8 (ref 5–15)
BUN: 25 mg/dL — ABNORMAL HIGH (ref 8–23)
CO2: 26 mmol/L (ref 22–32)
Calcium: 8.7 mg/dL — ABNORMAL LOW (ref 8.9–10.3)
Chloride: 99 mmol/L (ref 98–111)
Creatinine: 1.8 mg/dL — ABNORMAL HIGH (ref 0.61–1.24)
GFR, Estimated: 41 mL/min — ABNORMAL LOW (ref >60)
Glucose, Bld: 116 mg/dL — ABNORMAL HIGH (ref 70–99)
Potassium: 4 mmol/L (ref 3.5–5.1)
Sodium: 133 mmol/L — ABNORMAL LOW (ref 135–145)
Total Bilirubin: 1 mg/dL (ref 0.0–1.2)
Total Protein: 6.9 g/dL (ref 6.5–8.1)

## 2023-09-29 LAB — BASIC METABOLIC PANEL - CANCER CENTER ONLY
Anion gap: 8 (ref 5–15)
BUN: 25 mg/dL — ABNORMAL HIGH (ref 8–23)
CO2: 26 mmol/L (ref 22–32)
Calcium: 8.6 mg/dL — ABNORMAL LOW (ref 8.9–10.3)
Chloride: 99 mmol/L (ref 98–111)
Creatinine: 1.79 mg/dL — ABNORMAL HIGH (ref 0.61–1.24)
GFR, Estimated: 42 mL/min — ABNORMAL LOW (ref >60)
Glucose, Bld: 112 mg/dL — ABNORMAL HIGH (ref 70–99)
Potassium: 4 mmol/L (ref 3.5–5.1)
Sodium: 133 mmol/L — ABNORMAL LOW (ref 135–145)

## 2023-09-29 LAB — PHOSPHORUS: Phosphorus: 4.1 mg/dL (ref 2.5–4.6)

## 2023-09-29 LAB — MAGNESIUM: Magnesium: 1.7 mg/dL (ref 1.7–2.4)

## 2023-09-29 MED ORDER — CHLORHEXIDINE GLUCONATE CLOTH 2 % EX PADS
6.0000 | MEDICATED_PAD | Freq: Once | CUTANEOUS | Status: AC
  Administered 2023-09-30: 6 via TOPICAL

## 2023-09-29 MED ORDER — ORAL CARE MOUTH RINSE: 15.0000 mL | Freq: Once | OROMUCOSAL | Status: AC

## 2023-09-29 MED ORDER — GABAPENTIN 300 MG PO CAPS
300.0000 mg | ORAL_CAPSULE | ORAL | Status: AC
  Administered 2023-09-30: 300 mg via ORAL

## 2023-09-29 MED ORDER — SODIUM CHLORIDE 0.9 % IV SOLN
2.0000 g | INTRAVENOUS | Status: AC
  Administered 2023-09-30: 2 g via INTRAVENOUS

## 2023-09-29 MED ORDER — SODIUM CHLORIDE 0.9 % IV SOLN
Freq: Once | INTRAVENOUS | Status: AC
  Filled 2023-09-29: qty 250

## 2023-09-29 MED ORDER — NUTREN 1.5 EN LIQD: ENTERAL | Status: AC

## 2023-09-29 MED ORDER — OXYCODONE HCL 10 MG PO TABS
10.0000 mg | ORAL_TABLET | ORAL | 0 refills | Status: DC | PRN
Start: 2023-09-29 — End: 2023-11-26

## 2023-09-29 MED ORDER — SODIUM CHLORIDE 0.9% FLUSH: 3.0000 mL | INTRAVENOUS | Status: DC | PRN

## 2023-09-29 MED ORDER — ACETAMINOPHEN 500 MG PO TABS
1000.0000 mg | ORAL_TABLET | ORAL | Status: AC
  Administered 2023-09-30: 1000 mg via ORAL

## 2023-09-29 MED ORDER — FENTANYL 12 MCG/HR TD PT72: 1.0000 | MEDICATED_PATCH | TRANSDERMAL | 0 refills | Status: AC

## 2023-09-29 MED ORDER — CHLORHEXIDINE GLUCONATE CLOTH 2 % EX PADS: 6.0000 | MEDICATED_PAD | Freq: Once | CUTANEOUS | Status: DC

## 2023-09-29 MED ORDER — CHLORHEXIDINE GLUCONATE 0.12 % MT SOLN
15.0000 mL | Freq: Once | OROMUCOSAL | Status: AC
  Administered 2023-09-30: 15 mL via OROMUCOSAL

## 2023-09-29 MED ORDER — SODIUM CHLORIDE 0.9% FLUSH: 3.0000 mL | Freq: Two times a day (BID) | INTRAVENOUS | Status: DC

## 2023-09-29 MED ORDER — HEPARIN SOD (PORK) LOCK FLUSH 100 UNIT/ML IV SOLN
500.0000 [IU] | Freq: Once | INTRAVENOUS | Status: AC
  Administered 2023-09-29: 500 [IU] via INTRAVENOUS
  Filled 2023-09-29: qty 5

## 2023-09-29 NOTE — Progress Notes (Signed)
 Symptom Management Clinic [facility] at [facility] [phone] [phone]  Patient Care Team: Melchor Spoon, MD as PCP - General (Internal Medicine) Avonne Boettcher, MD as Consulting Physician (Oncology)   NAME OF PATIENT: Ricky [name]  [id number]  1955/08/27   DATE OF VISIT: 09/29/23  REASON FOR CONSULT: Ki Corbo is a 68 y.o. 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   INTERVAL HISTORY: Patient saw Dr. Randy Buttery on 09/14/2023.  Patient is status post concurrent chemoradiation.  He completed cycle 7 cisplatin  on 09/14/2023.  Patient with persistent dysphagia, poor oral intake and weight loss.  Patient was referred to Dr. Dana Duncan with plan for PEG placement, which is planned for tomorrow.  Patient comes to clinic today for labs and IV fluids.  Patient is also seeing nutrition in anticipation of tube feeds.  Today, patient denies any significant changes or concerns.  Denies any neurologic complaints. Denies recent fevers or illnesses. Denies any easy bleeding or bruising. Denies chest pain. Denies any nausea, vomiting, constipation, or diarrhea. Denies urinary complaints. Patient offers no further specific complaints today.  PAST MEDICAL HISTORY: Past Medical History:  Diagnosis Date   Anxiety    Arthritis    Carpal tunnel syndrome    Depression    Diabetes (HCC)    Hyperlipidemia    Hypertension    Loss of teeth due to extraction    Neoplasm related pain    Parkinson's disease (HCC)     PAST SURGICAL HISTORY:  Past Surgical History:  Procedure Laterality Date   CARPAL TUNNEL RELEASE Right 11/28/2020   Procedure: CARPAL TUNNEL RELEASE ENDOSCOPIC;  Surgeon: Elner Hahn, MD;  Location: [facility] ORS;  Service: Orthopedics;  Laterality: Right;   CARPAL TUNNEL RELEASE Left 01/22/2021   Procedure: CARPAL TUNNEL RELEASE ENDOSCOPIC;  Surgeon: Elner Hahn, MD;   Location: [facility] ORS;  Service: Orthopedics;  Laterality: Left;   IR IMAGING GUIDED PORT INSERTION  06/30/2023   KNEE ARTHROSCOPY Right    KNEE ARTHROSCOPY Left    MANDIBLE FRACTURE SURGERY     as teenager    HEMATOLOGY/ONCOLOGY HISTORY:  Oncology History  Squamous cell carcinoma of oropharynx (HCC)  06/22/2023 Initial Diagnosis   Squamous cell carcinoma of oropharynx (HCC)   06/22/2023 Cancer Staging   Staging form: Pharynx - HPV-Mediated Oropharynx, AJCC 8th Edition - Clinical stage from 06/22/2023: Stage I (cT2, cN1, cM0, p16+) - Signed by Avonne Boettcher, MD on 07/12/2023 Stage prefix: Initial diagnosis   07/12/2023 -  Chemotherapy   Patient is on Treatment Plan : HEAD/NECK Cisplatin  (40) q7d       ALLERGIES:  is allergic to mirtazapine and other.  MEDICATIONS:  Current Outpatient Medications  Medication Sig Dispense Refill   ACCU-CHEK GUIDE TEST test strip TEST 2X DAILY     Accu-Chek Softclix Lancets lancets 2 (two) times daily.     Blood Glucose Monitoring Suppl (ACCU-CHEK GUIDE ME) w/Device KIT See admin instructions.     buPROPion  (WELLBUTRIN  XL) 150 MG 24 hr tablet Take 1 tablet (150 mg total) by mouth daily. Total of 450 mg daily. Take along with 300 mg tab 30 tablet 5   buPROPion  (WELLBUTRIN  XL) 300 MG 24 hr tablet Take 1 tablet (300 mg total) by mouth daily. Total of 450 mg daily. Take along with 150 mg tab 30 tablet 5   busPIRone  (BUSPAR ) 7.5 MG tablet Take 1 tablet (7.5 mg  total) by mouth 2 (two) times daily. 60 tablet 3   calcium  carbonate (CALCIUM  600) 600 MG TABS tablet Take 1 tablet (600 mg total) by mouth daily. For low calcium  60 tablet 0   carbidopa -levodopa  (SINEMET  IR) 25-100 MG tablet Take 2-3 tablets by mouth See admin instructions. Take 3 tablets in morning 2 tablet at bedtime     clonazePAM  (KLONOPIN ) 1 MG tablet Take 1 tablet (1 mg total) by mouth 3 (three) times daily as needed for anxiety. 90 tablet 0   cyanocobalamin  (VITAMIN B12) 1000 MCG tablet Take 1,000  mcg by mouth daily.     dexamethasone  (DECADRON ) 2 MG tablet Take 1 tablet (2 mg total) by mouth daily. (Patient not taking: Reported on 09/27/2023) 5 tablet 0   fentaNYL  (DURAGESIC ) 25 MCG/HR Place 1 patch onto the skin every 3 (three) days. 10 patch 0   folic acid  (FOLVITE ) 1 MG tablet Take 1 tablet (1 mg total) by mouth daily. 30 tablet 3   ketoconazole (NIZORAL) 2 % shampoo Apply 1 application  topically every other day.     lidocaine -prilocaine  (EMLA ) cream Apply to affected area once 30 g 3   magic mouthwash (multi-ingredient) oral suspension Swish and swallow 5-10 mLs by mouth 4 (four) times daily. (Patient not taking: Reported on 09/28/2023) 480 mL 3   magnesium  chloride (SLOW-MAG) 64 MG TBEC SR tablet Take 2 tablets (128 mg total) by mouth daily. For low magnesium  (Patient taking differently: Take 1 tablet by mouth daily. For low magnesium ) 120 tablet 00   metFORMIN  (GLUCOPHAGE ) 500 MG tablet Take 500 mg by mouth daily with breakfast.     nystatin  (MYCOSTATIN ) 100000 UNIT/ML suspension Take 5 mLs (500,000 Units total) by mouth 4 (four) times daily. (Patient not taking: Reported on 09/28/2023) 60 mL 0   ondansetron  (ZOFRAN ) 8 MG tablet Take 1 tablet (8 mg total) by mouth every 8 (eight) hours as needed for nausea or vomiting. Start on the third day after cisplatin . 30 tablet 1   Oxycodone  HCl 10 MG TABS Take 1 tablet (10 mg total) by mouth every 4 (four) hours as needed. 90 tablet 0   prochlorperazine  (COMPAZINE ) 10 MG tablet Take 1 tablet (10 mg total) by mouth every 6 (six) hours as needed (Nausea or vomiting). 30 tablet 1   rosuvastatin  (CRESTOR ) 20 MG tablet Take 20 mg by mouth daily.     sertraline  (ZOLOFT ) 100 MG tablet Take 1.5 tablets (150 mg total) by mouth at bedtime. 45 tablet 3   sucralfate  (CARAFATE ) 1 g tablet Take 1 tablet (1 g total) by mouth 3 (three) times daily before meals. Dissolve in 4 tbs of warm water , swish and swallow (Patient taking differently: Take 1 g by mouth 3  (three) times daily with meals as needed (Numb mouth). Dissolve in 4 tbs of warm water , swish and swallow) 90 tablet 1   tadalafil (CIALIS) 5 MG tablet Take 5 mg by mouth daily.     tamsulosin (FLOMAX) 0.4 MG CAPS capsule Take 0.4 mg by mouth daily.     traZODone  (DESYREL ) 50 MG tablet Take 0.5-2 tablets (25-100 mg total) by mouth at bedtime as needed for sleep. (Patient taking differently: Take 100 mg by mouth at bedtime.) 60 tablet 1   No current facility-administered medications for this visit.   Facility-Administered Medications Ordered in Other Visits  Medication Dose Route Frequency Provider Last Rate Last Admin   0.9 %  sodium chloride  infusion   Intravenous Once Oralee Rapaport R, NP  VITAL SIGNS: There were no vitals taken for this visit. There were no vitals filed for this visit.   Estimated body mass index is 22.12 kg/m as calculated from the following:   Height as of 09/27/23: 5\' 9"  (1.753 m).   Weight as of 09/27/23: 149 lb 12.8 oz (67.9 kg).  LABS: CBC:    Component Value Date/Time   WBC 3.8 (L) 09/22/2023 0849   WBC 2.9 (L) 08/24/2023 1055   HGB 7.5 (L) 09/22/2023 0849   HCT 21.2 (L) 09/22/2023 0849   HCT 24.4 (L) 09/14/2023 0836   PLT 105 (L) 09/22/2023 0849   MCV 88.0 09/22/2023 0849   NEUTROABS 3.1 09/22/2023 0849   LYMPHSABS 0.3 (L) 09/22/2023 0849   MONOABS 0.3 09/22/2023 0849   EOSABS 0.0 09/22/2023 0849   BASOSABS 0.0 09/22/2023 0849   Comprehensive Metabolic Panel:    Component Value Date/Time   NA 133 (L) 09/29/2023 0919   NA 133 (L) 09/29/2023 0919   K 4.0 09/29/2023 0919   K 4.0 09/29/2023 0919   CL 99 09/29/2023 0919   CL 99 09/29/2023 0919   CO2 26 09/29/2023 0919   CO2 26 09/29/2023 0919   BUN 25 (H) 09/29/2023 0919   BUN 25 (H) 09/29/2023 0919   CREATININE 1.80 (H) 09/29/2023 0919   CREATININE 1.79 (H) 09/29/2023 0919   GLUCOSE 116 (H) 09/29/2023 0919   GLUCOSE 112 (H) 09/29/2023 0919   CALCIUM  8.7 (L) 09/29/2023 0919   CALCIUM   8.6 (L) 09/29/2023 0919   AST 15 09/29/2023 0919   ALT <5 09/29/2023 0919   ALKPHOS 38 09/29/2023 0919   BILITOT 1.0 09/29/2023 0919   PROT 6.9 09/29/2023 0919   ALBUMIN 4.1 09/29/2023 0919    RADIOGRAPHIC STUDIES: No results found.   PERFORMANCE STATUS (ECOG) : 1 - Symptomatic but completely ambulatory  Review of Systems Unless otherwise noted, a complete review of systems is negative.  Physical Exam General: NAD HEENT: Right oral lesion Cardiovascular: regular rate and rhythm Pulmonary: clear ant fields Abdomen: soft, nontender, + bowel sounds GU: no suprapubic tenderness Extremities: no edema, no joint deformities Skin: no rashes Neurological: Weakness but otherwise nonfocal  IMPRESSION/PLAN: Squamous cell carcinoma of the head neck -s/p concurrent cisplatin /XRT  Dysphagia/poor oral intake/weight loss-patient pending PEG placement tomorrow.  Labs today show mild hyponatremia/hypocalcemia.  Also with mild AKI, likely from dehydration.  Proceed with IV fluids.  Avoid nephrotoxic medications. Patient pending PEG placement tomorrow. Anticipate improvement as nutrition stabilizes. Will follow up with post PEG labs. RD to manage TFs.   Neoplasm related pain -stable on current regimen of oxycodone /fentanyl .  Patient and significant other would like to proceed with weaning opioids.  Reportedly, they are now using half a tablet (5mg ) of oxycodone .  Will reduce fentanyl  to 12.5 mcg every 72 hours.  Anxiety/depression-patient has chronic anxiety.  Followed by psychiatry.  Case and plan discussed with Dr. Randy Buttery  Patient expressed understanding and was in agreement with this plan. He also understands that He can call clinic at any time with any questions, concerns, or complaints.   Thank you for allowing me to participate in the care of this very pleasant patient.   Time Total: 20 minutes  Visit consisted of counseling and education dealing with the complex and emotionally intense  issues of symptom management in the setting of serious illness.Greater than 50%  of this time was spent counseling and coordinating care related to the above assessment and plan.  Signed by: Gerilyn Kobus, PhD,  NP-C

## 2023-09-29 NOTE — Patient Instructions (Signed)
 Your procedure is scheduled on: 09/30/23  Report to the Registration Desk on the 1st floor of the [facility]. To find out your arrival time, please call [phone] between 1PM - 3PM on: 09/29/23 If your arrival time is 6:00 am, do not arrive before that time as the [facility] entrance doors do not open until 6:00 am.  REMEMBER: Instructions that are not followed completely may result in serious medical risk, up to and including death; or upon the discretion of your surgeon and anesthesiologist your surgery may need to be rescheduled.  Do not eat food or drink any liquids after midnight the night before surgery.  No gum chewing or hard candies.  One week prior to surgery: Stop Anti-inflammatories (NSAIDS) such as Advil, Aleve, Ibuprofen, Motrin, Naproxen, Naprosyn and Aspirin based products such as Excedrin, Goody's Powder, BC Powder.  Stop ANY OVER THE COUNTER supplements until after surgery.  You may however, continue to take Tylenol  if needed for pain up until the day of surgery.  HOLD Metformin  2 days prior to your surgery.  HOLD tadalafil (CIALIS) 2 days prior to your procedure  ON THE DAY OF SURGERY ONLY TAKE THESE MEDICATIONS WITH SIPS OF WATER :  buPROPion  (WELLBUTRIN  XL)  busPIRone  (BUSPAR  ) carbidopa -levodopa   Oxycodone  if needed clonazePAM  (KLONOPIN ) if needed   No Alcohol for 24 hours before or after surgery.  No Smoking including e-cigarettes for 24 hours before surgery.  No chewable tobacco products for at least 6 hours before surgery.  No nicotine patches on the day of surgery.  Do not use any "recreational" drugs for at least a week (preferably 2 weeks) before your surgery.  Please be advised that the combination of cocaine and anesthesia may have negative outcomes, up to and including death. If you test positive for cocaine, your surgery will be cancelled.  On the morning of surgery brush your teeth with toothpaste and water , you may rinse your mouth  with mouthwash if you wish. Do not swallow any toothpaste or mouthwash.  Use CHG Soap or wipes as directed on instruction sheet.  Do not wear jewelry, make-up, hairpins, clips or nail polish.  For welded (permanent) jewelry: bracelets, anklets, waist bands, etc.  Please have this removed prior to surgery.  If it is not removed, there is a chance that hospital personnel will need to cut it off on the day of surgery.  Do not wear lotions, powders, or perfumes.   Do not shave body hair from the neck down 48 hours before surgery.  Contact lenses, hearing aids and dentures may not be worn into surgery.  Do not bring valuables to the hospital. [facility] is not responsible for any missing/lost belongings or valuables.   Notify your doctor if there is any change in your medical condition (cold, fever, infection).  Wear comfortable clothing (specific to your surgery type) to the hospital.  After surgery, you can help prevent lung complications by doing breathing exercises.  Take deep breaths and cough every 1-2 hours. Your doctor may order a device called an Incentive Spirometer to help you take deep breaths. When coughing or sneezing, hold a pillow firmly against your incision with both hands. This is called "splinting." Doing this helps protect your incision. It also decreases belly discomfort.  If you are being admitted to the hospital overnight, leave your suitcase in the car. After surgery it may be brought to your room.  In case of increased patient census, it may be necessary for you, the  patient, to continue your postoperative care in the Same Day Surgery department.  If you are being discharged the day of surgery, you will not be allowed to drive home. You will need a responsible individual to drive you home and stay with you for 24 hours after surgery.   If you are taking public transportation, you will need to have a responsible individual with you.  Please call the  Pre-admissions Testing Dept. at [phone] if you have any questions about these instructions.  Surgery Visitation Policy:  Patients having surgery or a procedure may have two visitors.  Children under the age of 54 must have an adult with them who is not the patient.  Inpatient Visitation:    Visiting hours are 7 a.m. to 8 p.m. Up to four visitors are allowed at one time in a patient room. The visitors may rotate out with other people during the day.  One visitor age 72 or older may stay with the patient overnight and must be in the room by 8 p.m.  Preparing the Skin Before Surgery     To help prevent the risk of infection at your surgical site, we are now providing you with rinse-free Sage 2% Chlorhexidine  Gluconate (CHG) disposable wipes.  Chlorhexidine  Gluconate (CHG) Soap  o An antiseptic cleaner that kills germs and bonds with the skin to continue killing germs even after washing  o Used for showering the night before surgery and morning of surgery  The night before surgery: Shower or bathe with warm water . Do not apply perfume, lotions, powders. Wait one hour after shower. Skin should be dry and cool. Open Sage wipe package - use 6 disposable cloths. Wipe body using one cloth for the right arm, one cloth for the left arm, one cloth for the right leg, one cloth for the left leg, one cloth for the chest/abdomen area, and one cloth for the back. Do not use on open wounds or sores. Do not use on face or genitals (private parts). If you are breast feeding, do not use on breasts. 5. Do not rinse, allow to dry. 6. Skin may feel "tacky" for several minutes. 7. Dress in clean clothes. 8. Place clean sheets on your bed and do not sleep with pets.  REPEAT ABOVE ON THE MORNING OF SURGERY BEFORE ARRIVING TO THE HOSPITAL.

## 2023-09-29 NOTE — Progress Notes (Signed)
 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  Patient has completed concurrent chemotherapy and radiation.  Last radiation treatment on 5/8.    Met with patient and wife, Ricky [name] for nutrition follow-up and discuss tube feeding regimen.  Patient having dysphagia and taste alterations ("food has no taste" ) leading to overall poor po intake throughout much of treatment and following treatment. Recently only able to eat 2 eggs and 1/2 milkshake for the day.  Requiring fluids today in clinic.     Anticipate long term need of feeding tube  Nutrition Focused Physical Exam: Orbital Region: moderate Buccal Region: severe Upper Arm Region: severe Thoracic and Lumbar Region: severe Temple Region: moderate Clavicle Bone Region: moderate Shoulder and Acromion Bone Region: severe Scapular Bone Region: severe Dorsal Hand: mild Patellar Region: unable to assess Anterior Thigh Region: unable to assess Posterior Calf Region: moderate Edema (RD assessment): unable to assess   Medications: carafate , folate, compazine , zofran , Vit B 12, slow magnesium , metformin   Labs: Na 133, BUN 25, creatinine 1.80, calcium  8.7 K WNL, Mag and Phos WNL  Anthropometrics:   Height: 69 inches Weight: 149 lb 12.8 oz on 5/19 UBW: 177 lb on 06/24/23 BMI: 22  12% weight loss in the last 3 months, significant    Estimated Energy Needs  Kcals: 1750-2100 Protein: 84-105 g Fluid: 1750-2100 ml  NUTRITION DIAGNOSIS: Inadequate oral intake related to cancer of oropharynx and side effects from treatment (taste alterations, dysphagia, poor po intake, weight loss)   MALNUTRITION DIAGNOSIS: Patient meets criteria for severe malnutrition related to 12% weight loss in the last 3 months, eating less than 75% of estimated energy needs for > 1 month and severe fat loss and moderate/severe muscle mass loss.    INTERVENTION:  Educated wife and patient on tube care and how to give water  flush and  tube feeding via sample tube.  Patient and wife were able to demonstrate how to give water  flush in sample feeding tube.  Written instructions were given Recommend nutren 1.5, 5 cartons daily goal (1 carton 5 times a day) rate via bolus feeding.  Flush with 60cc of water  before and 120 cc water  after feeding Provides 1875 calories, 85 g of protein and free water  (including water  in formula).  Meets 100% of needs Recommend starting patient on 2 cartons daily for 2 days.  Will plan to increase by 1 carton every 2 days due to refeeding risk.  Written tube feeding instructions were given to patient and wife.  Recommend rechecking CMP, phosphorus and magnesium  at least 2 times a week until at goal rate.  Recommend MVI daily Recommend thiamine  (B1) 100mg  for 7 days Contacted [employer] to supply tube feeding formula and supplies for bolus tube feeding.   Discussed with NP regarding home health order for nursing support in the home.  Contact number given to wife     MONITORING, EVALUATION, GOAL: weight trends, tube feeding tolerance   NEXT VISIT: Tuesday, May 27 in clinic  Ricky [name] B. Ricky [name], CSO, LDN Registered Dietitian [phone]

## 2023-09-29 NOTE — Anesthesia Preprocedure Evaluation (Signed)
 Anesthesia Evaluation  Patient identified by MRN, date of birth, ID band Patient awake    Reviewed: Allergy & Precautions, H&P , NPO status , Patient's Chart, lab work & pertinent test results  Airway Mallampati: II  TM Distance: >3 FB Neck ROM: full    Dental no notable dental hx.    Pulmonary neg pulmonary ROS   Pulmonary exam normal        Cardiovascular hypertension, negative cardio ROS Normal cardiovascular exam     Neuro/Psych  PSYCHIATRIC DISORDERS Anxiety Depression    Parkinson's disease    GI/Hepatic negative GI ROS, Neg liver ROS,,,  Endo/Other  diabetes, Type 2    Renal/GU Renal InsufficiencyRenal disease     Musculoskeletal   Abdominal   Peds  Hematology  (+) Blood dyscrasia, anemia Pancytopenic with thrombocytopenia   Anesthesia Other Findings Squamous cell carcinoma of oropharynx. Patient is status post concurrent chemoradiation.  He completed cycle 7 cisplatin  on 09/14/2023.   Patient with persistent dysphagia, poor oral intake and weight loss.  Patient was referred to Dr. Dana Duncan with plan for PEG placement.Ricky [name]   Neoplasm related pain -stable on current regimen of oxycodone Ricky [name]  Past Medical History: No date: Anemia No date: Anxiety No date: Arthritis No date: Carpal tunnel syndrome No date: Depression No date: Diabetes (HCC) No date: Hyperlipidemia No date: Hypertension No date: Loss of teeth due to extraction No date: Neoplasm related pain No date: Parkinson's disease [facility])  Past Surgical History: 11/28/2020: CARPAL TUNNEL RELEASE; Right     Comment:  Procedure: CARPAL TUNNEL RELEASE ENDOSCOPIC;  Surgeon:               Elner Hahn, MD;  Location: [facility] ORS;  Service:               Orthopedics;  Laterality: Right; 01/22/2021: CARPAL TUNNEL RELEASE; Left     Comment:  Procedure: CARPAL TUNNEL RELEASE ENDOSCOPIC;  Surgeon:               Elner Hahn, MD;  Location: [facility] ORS;  Service:                Orthopedics;  Laterality: Left; 06/30/2023: IR IMAGING GUIDED PORT INSERTION No date: KNEE ARTHROSCOPY; Right No date: KNEE ARTHROSCOPY; Left No date: MANDIBLE FRACTURE SURGERY     Comment:  as teenager     Reproductive/Obstetrics negative OB ROS                              Anesthesia Physical Anesthesia Plan  ASA: 3  Anesthesia Plan: General ETT   Post-op Pain Management:    Induction: Intravenous  PONV Risk Score and Plan: 2 and Ondansetron , Dexamethasone  and Midazolam   Airway Management Planned: Oral ETT  Additional Equipment:   Intra-op Plan:   Post-operative Plan: Extubation in OR  Informed Consent:      Dental Advisory Given  Plan Discussed with: CRNA and Surgeon  Anesthesia Plan Comments:          Anesthesia Quick Evaluation

## 2023-09-30 ENCOUNTER — Telehealth: Payer: Self-pay | Admitting: *Deleted

## 2023-09-30 ENCOUNTER — Encounter
Admission: RE | Disposition: A | Discharge status: Home / Self Care | Payer: Self-pay | Source: Home / Self Care | Attending: Surgery

## 2023-09-30 ENCOUNTER — Encounter: Payer: Self-pay | Admitting: Surgery

## 2023-09-30 ENCOUNTER — Other Ambulatory Visit: Payer: Self-pay

## 2023-09-30 ENCOUNTER — Ambulatory Visit
Admission: RE | Admit: 2023-09-30 | Discharge: 2023-09-30 | Disposition: A | Discharge status: Home / Self Care | Attending: Surgery | Admitting: Surgery

## 2023-09-30 ENCOUNTER — Ambulatory Visit: Admitting: Anesthesiology

## 2023-09-30 DIAGNOSIS — G20A1 Parkinson's disease without dyskinesia, without mention of fluctuations: Secondary | ICD-10-CM | POA: Insufficient documentation

## 2023-09-30 DIAGNOSIS — Z6822 Body mass index (BMI) 22.0-22.9, adult: Secondary | ICD-10-CM | POA: Insufficient documentation

## 2023-09-30 DIAGNOSIS — I10 Essential (primary) hypertension: Secondary | ICD-10-CM | POA: Insufficient documentation

## 2023-09-30 DIAGNOSIS — C760 Malignant neoplasm of head, face and neck: Secondary | ICD-10-CM | POA: Diagnosis not present

## 2023-09-30 DIAGNOSIS — D61818 Other pancytopenia: Secondary | ICD-10-CM | POA: Diagnosis not present

## 2023-09-30 DIAGNOSIS — Z7984 Long term (current) use of oral hypoglycemic drugs: Secondary | ICD-10-CM | POA: Diagnosis not present

## 2023-09-30 DIAGNOSIS — E46 Unspecified protein-calorie malnutrition: Secondary | ICD-10-CM

## 2023-09-30 DIAGNOSIS — Z923 Personal history of irradiation: Secondary | ICD-10-CM | POA: Insufficient documentation

## 2023-09-30 DIAGNOSIS — C109 Malignant neoplasm of oropharynx, unspecified: Secondary | ICD-10-CM

## 2023-09-30 DIAGNOSIS — F419 Anxiety disorder, unspecified: Secondary | ICD-10-CM | POA: Diagnosis not present

## 2023-09-30 DIAGNOSIS — Z01810 Encounter for preprocedural cardiovascular examination: Secondary | ICD-10-CM | POA: Diagnosis not present

## 2023-09-30 DIAGNOSIS — F32A Depression, unspecified: Secondary | ICD-10-CM | POA: Diagnosis not present

## 2023-09-30 DIAGNOSIS — E119 Type 2 diabetes mellitus without complications: Secondary | ICD-10-CM | POA: Diagnosis not present

## 2023-09-30 DIAGNOSIS — E639 Nutritional deficiency, unspecified: Secondary | ICD-10-CM | POA: Diagnosis present

## 2023-09-30 HISTORY — PX: INSERTION, GASTROSTOMY TUBE, ROBOT-ASSISTED: SHX7600

## 2023-09-30 LAB — CBC WITH DIFFERENTIAL/PLATELET
Abs Immature Granulocytes: 0 10*3/uL (ref 0.00–0.07)
Basophils Absolute: 0 10*3/uL (ref 0.0–0.1)
Basophils Relative: 2 %
Eosinophils Absolute: 0 10*3/uL (ref 0.0–0.5)
Eosinophils Relative: 2 %
HCT: 23.4 % — ABNORMAL LOW (ref 39.0–52.0)
Hemoglobin: 7.9 g/dL — ABNORMAL LOW (ref 13.0–17.0)
Immature Granulocytes: 0 %
Lymphocytes Relative: 16 %
Lymphs Abs: 0.3 10*3/uL — ABNORMAL LOW (ref 0.7–4.0)
MCH: 31.1 pg (ref 26.0–34.0)
MCHC: 33.8 g/dL (ref 30.0–36.0)
MCV: 92.1 fL (ref 80.0–100.0)
Monocytes Absolute: 0.1 10*3/uL (ref 0.1–1.0)
Monocytes Relative: 7 %
Neutro Abs: 1.5 10*3/uL — ABNORMAL LOW (ref 1.7–7.7)
Neutrophils Relative %: 73 %
Platelets: 113 10*3/uL — ABNORMAL LOW (ref 150–400)
RBC: 2.54 MIL/uL — ABNORMAL LOW (ref 4.22–5.81)
RDW: 16.4 % — ABNORMAL HIGH (ref 11.5–15.5)
WBC: 2 10*3/uL — ABNORMAL LOW (ref 4.0–10.5)
nRBC: 0 % (ref 0.0–0.2)

## 2023-09-30 LAB — GLUCOSE, CAPILLARY
Glucose-Capillary: 83 mg/dL (ref 70–99)
Glucose-Capillary: 153 mg/dL — ABNORMAL HIGH (ref 70–99)

## 2023-09-30 SURGERY — INSERTION, GASTROSTOMY TUBE, ROBOT-ASSISTED
Anesthesia: General | Site: Abdomen

## 2023-09-30 MED ORDER — ROCURONIUM BROMIDE 100 MG/10ML IV SOLN
INTRAVENOUS | Status: DC | PRN
  Administered 2023-09-30: 30 mg via INTRAVENOUS
  Administered 2023-09-30: 20 mg via INTRAVENOUS
  Administered 2023-09-30: 30 mg via INTRAVENOUS
  Administered 2023-09-30: 20 mg via INTRAVENOUS

## 2023-09-30 MED ORDER — EPHEDRINE SULFATE-NACL 50-0.9 MG/10ML-% IV SOSY
PREFILLED_SYRINGE | INTRAVENOUS | Status: DC | PRN
  Administered 2023-09-30 (×2): 10 mg via INTRAVENOUS

## 2023-09-30 MED ORDER — OXYCODONE HCL 5 MG/5ML PO SOLN: 5.0000 mg | Freq: Once | ORAL | Status: DC | PRN

## 2023-09-30 MED ORDER — BUPIVACAINE-EPINEPHRINE 0.25% -1:200000 IJ SOLN
INTRAMUSCULAR | Status: DC | PRN
  Administered 2023-09-30: 50 mL

## 2023-09-30 MED ORDER — ACETAMINOPHEN 10 MG/ML IV SOLN: 1000.0000 mg | Freq: Once | INTRAVENOUS | Status: DC | PRN

## 2023-09-30 MED ORDER — ACETAMINOPHEN 500 MG PO TABS
ORAL_TABLET | ORAL | Status: AC
  Filled 2023-09-30: qty 2

## 2023-09-30 MED ORDER — BUPIVACAINE-EPINEPHRINE (PF) 0.25% -1:200000 IJ SOLN
INTRAMUSCULAR | Status: AC
  Filled 2023-09-30: qty 30

## 2023-09-30 MED ORDER — FENTANYL CITRATE (PF) 100 MCG/2ML IJ SOLN: 25.0000 ug | INTRAMUSCULAR | Status: DC | PRN

## 2023-09-30 MED ORDER — GABAPENTIN 300 MG PO CAPS
ORAL_CAPSULE | ORAL | Status: AC
  Filled 2023-09-30: qty 1

## 2023-09-30 MED ORDER — LIDOCAINE HCL (CARDIAC) PF 100 MG/5ML IV SOSY
PREFILLED_SYRINGE | INTRAVENOUS | Status: DC | PRN
  Administered 2023-09-30: 60 mg via INTRAVENOUS

## 2023-09-30 MED ORDER — 0.9 % SODIUM CHLORIDE (POUR BTL) OPTIME
TOPICAL | Status: DC | PRN
  Administered 2023-09-30: 500 mL

## 2023-09-30 MED ORDER — SUGAMMADEX SODIUM 200 MG/2ML IV SOLN
INTRAVENOUS | Status: DC | PRN
  Administered 2023-09-30: 200 mg via INTRAVENOUS

## 2023-09-30 MED ORDER — OXYCODONE HCL 5 MG PO TABS: 5.0000 mg | ORAL_TABLET | Freq: Once | ORAL | Status: DC | PRN

## 2023-09-30 MED ORDER — PHENYLEPHRINE 80 MCG/ML (10ML) SYRINGE FOR IV PUSH (FOR BLOOD PRESSURE SUPPORT)
PREFILLED_SYRINGE | INTRAVENOUS | Status: DC | PRN
  Administered 2023-09-30: 160 ug via INTRAVENOUS

## 2023-09-30 MED ORDER — FENTANYL CITRATE (PF) 100 MCG/2ML IJ SOLN
INTRAMUSCULAR | Status: DC | PRN
  Administered 2023-09-30: 25 ug via INTRAVENOUS
  Administered 2023-09-30: 50 ug via INTRAVENOUS
  Administered 2023-09-30: 25 ug via INTRAVENOUS

## 2023-09-30 MED ORDER — CHLORHEXIDINE GLUCONATE 0.12 % MT SOLN
OROMUCOSAL | Status: AC
  Filled 2023-09-30: qty 15

## 2023-09-30 MED ORDER — BUPIVACAINE LIPOSOME 1.3 % IJ SUSP
INTRAMUSCULAR | Status: AC
  Filled 2023-09-30: qty 20

## 2023-09-30 MED ORDER — MIDAZOLAM HCL 2 MG/2ML IJ SOLN
INTRAMUSCULAR | Status: AC
  Filled 2023-09-30: qty 2

## 2023-09-30 MED ORDER — DROPERIDOL 2.5 MG/ML IJ SOLN: 0.6250 mg | Freq: Once | INTRAMUSCULAR | Status: DC | PRN

## 2023-09-30 MED ORDER — PROPOFOL 10 MG/ML IV BOLUS
INTRAVENOUS | Status: AC
  Filled 2023-09-30: qty 60

## 2023-09-30 MED ORDER — DEXAMETHASONE SODIUM PHOSPHATE 10 MG/ML IJ SOLN
INTRAMUSCULAR | Status: DC | PRN
  Administered 2023-09-30: 5 mg via INTRAVENOUS

## 2023-09-30 MED ORDER — LACTATED RINGERS IV SOLN: INTRAVENOUS | Status: DC | PRN

## 2023-09-30 MED ORDER — FENTANYL CITRATE (PF) 100 MCG/2ML IJ SOLN
INTRAMUSCULAR | Status: AC
  Filled 2023-09-30: qty 2

## 2023-09-30 MED ORDER — PROPOFOL 10 MG/ML IV BOLUS
INTRAVENOUS | Status: DC | PRN
  Administered 2023-09-30: 30 mg via INTRAVENOUS
  Administered 2023-09-30: 100 mg via INTRAVENOUS

## 2023-09-30 MED ORDER — ONDANSETRON HCL 4 MG/2ML IJ SOLN
INTRAMUSCULAR | Status: DC | PRN
  Administered 2023-09-30: 4 mg via INTRAVENOUS

## 2023-09-30 MED ORDER — SODIUM CHLORIDE 0.9 % IV SOLN
INTRAVENOUS | Status: AC
  Filled 2023-09-30: qty 2

## 2023-09-30 SURGICAL SUPPLY — 54 items
BINDER ABDOMINAL 9 SM 30-45 (SOFTGOODS) IMPLANT
CANNULA REDUCER 12-8 DVNC XI (CANNULA) ×1 IMPLANT
COVER TIP SHEARS 8 DVNC (MISCELLANEOUS) ×1 IMPLANT
COVER WAND RF STERILE (DRAPES) ×1 IMPLANT
DERMABOND ADVANCED .7 DNX12 (GAUZE/BANDAGES/DRESSINGS) ×1 IMPLANT
DRAPE ARM DVNC X/XI (DISPOSABLE) ×3 IMPLANT
DRAPE COLUMN DVNC XI (DISPOSABLE) ×1 IMPLANT
DRSG TEGADERM 4X4.75 (GAUZE/BANDAGES/DRESSINGS) IMPLANT
ELECTRODE REM PT RTRN 9FT ADLT (ELECTROSURGICAL) ×1 IMPLANT
FORCEPS BPLR R/ABLATION 8 DVNC (INSTRUMENTS) ×1 IMPLANT
G-TUBE MIC 18FR ENFIT ADLT (TUBING) IMPLANT
G-TUBE MIC 22FR ENFIT ADLT (TUBING) IMPLANT
G-TUBE MIC ADLT 16FR ENFIT (TUBING) IMPLANT
GLOVE BIO SURGEON STRL SZ7 (GLOVE) ×2 IMPLANT
GOWN STRL REUS W/ TWL LRG LVL3 (GOWN DISPOSABLE) ×3 IMPLANT
GRASPER TIP-UP FEN DVNC XI (INSTRUMENTS) ×1 IMPLANT
IRRIGATION STRYKERFLOW (MISCELLANEOUS) IMPLANT
J-TUBE MIC 22FX51 UNV ENFIT (TUBING) IMPLANT
KIT PINK PAD W/HEAD ARE REST (MISCELLANEOUS) ×1 IMPLANT
KIT PINK PAD W/HEAD ARM REST (MISCELLANEOUS) ×1 IMPLANT
LABEL OR SOLS (LABEL) ×1 IMPLANT
MANIFOLD NEPTUNE II (INSTRUMENTS) ×1 IMPLANT
NDL DRIVE SUT CUT DVNC (INSTRUMENTS) ×1 IMPLANT
NDL HYPO 22X1.5 SAFETY MO (MISCELLANEOUS) ×1 IMPLANT
NDL INSUFFLATION 14GA 120MM (NEEDLE) ×1 IMPLANT
NEEDLE DRIVE SUT CUT DVNC (INSTRUMENTS) ×1 IMPLANT
NEEDLE HYPO 22X1.5 SAFETY MO (MISCELLANEOUS) ×1 IMPLANT
NEEDLE INSUFFLATION 14GA 120MM (NEEDLE) ×1 IMPLANT
OBTURATOR OPTICALSTD 8 DVNC (TROCAR) ×1 IMPLANT
PACK LAP CHOLECYSTECTOMY (MISCELLANEOUS) ×1 IMPLANT
SCISSORS MNPLR CVD DVNC XI (INSTRUMENTS) ×1 IMPLANT
SEAL UNIV 5-12 XI (MISCELLANEOUS) ×3 IMPLANT
SEALER VESSEL EXT DVNC XI (MISCELLANEOUS) IMPLANT
SET TUBE SMOKE EVAC HIGH FLOW (TUBING) ×1 IMPLANT
SOLUTION ELECTROSURG ANTI STCK (MISCELLANEOUS) ×1 IMPLANT
SPONGE DRAIN TRACH 4X4 STRL 2S (GAUZE/BANDAGES/DRESSINGS) ×1 IMPLANT
SPONGE T-LAP 18X18 ~~LOC~~+RFID (SPONGE) ×1 IMPLANT
SPONGE T-LAP 4X18 ~~LOC~~+RFID (SPONGE) ×1 IMPLANT
SUT STRATA 2-0 23CM CT-2 (SUTURE) ×1 IMPLANT
SUT STRATA 3-0 SH (SUTURE) ×1 IMPLANT
SUT STRATA PDS 2-0 23 CT-1 (SUTURE) ×1 IMPLANT
SUT VICRYL 0 UR6 27IN ABS (SUTURE) ×2 IMPLANT
SUTURE EHLN 3-0 FS-10 30 BLK (SUTURE) ×1 IMPLANT
SUTURE MNCRL 4-0 27XMF (SUTURE) ×1 IMPLANT
SYR 20ML LL LF (SYRINGE) ×1 IMPLANT
SYR 30ML LL (SYRINGE) ×1 IMPLANT
TAPE TRANSPORE STRL 2 31045 (GAUZE/BANDAGES/DRESSINGS) ×1 IMPLANT
TRAP FLUID SMOKE EVACUATOR (MISCELLANEOUS) ×1 IMPLANT
TUBE GASTRO 18FR ENFIT (TUBING) IMPLANT
TUBE GASTRO 22FR ENFIT (TUBING) ×1 IMPLANT
TUBE GSTRM 1 16FR INTNL ENFIT (TUBING) IMPLANT
TUBE GSTRM 10.5X14FR YPRT (TUBING) IMPLANT
TUBE JEJUNAL 22FR ENFIT (TUBING) IMPLANT
WATER STERILE IRR 500ML POUR (IV SOLUTION) ×1 IMPLANT

## 2023-09-30 NOTE — Op Note (Signed)
 Robotic assisted laparoscopic gastrostomy tube placement # 22 FR   Pre-operative Diagnosis: malnutrition   Post-operative Diagnosis: same   Procedure:  Robotic assisted laparoscopic G tube placement # 22 FR   Surgeon: Evelia Hipp, MD FACS   Anesthesia: Gen. with endotracheal tube   Findings: No evidence of significant intra-abdominal pathology, no injuries G tube within the body of the stomach, intraluminal and well positioned   Estimated Blood Loss:5 cc             Complications: none     Procedure Details  The patient was seen again in the Holding Room. The benefits, complications, treatment options, and expected outcomes were discussed with the Family. The risks of bleeding, infection, recurrence of symptoms, failure to resolve symptoms, bowel injury, any of which could require further surgery were reviewed .   The  family concurred with the proposed plan, giving informed consent.  The patient was taken to Operating Room, identified  and the procedure verified. A Time Out was held and the above information confirmed.   Prior to the induction of general anesthesia, antibiotic prophylaxis was administered. VTE prophylaxis was in place. General endotracheal anesthesia was then administered and tolerated well. After the induction, the abdomen was prepped with Chloraprep and draped in the sterile fashion. The patient was positioned in the supine position.   Palmer's point selected to insert veres needle. Appropriate insufflation pressures seen as well as saline drop test ; pneumoperitoneum was established. Left LQ incision created and using Optiview technique 8 mm trochar placed. No evidence of any injuries observed. Two 8-mm ports were placed under direct vision.  I grasped the body  stomach and tented toward the abdominal wall, small left subcostal incision created to placed Gastrostomy tube under direct visualization.     The patient was positioned  in reverse Trendelenburg, robot  was brought to the surgical field and docked in the standard fashion.  We made sure all the instrumentation was kept indirect view at all times and that there were no collision between the arms. I scrubbed out and went to the console. Using the scissors I performed a gastrotomy and confirmed that I was intraluminally. A partial pursestring suture was placed using 3 0 V-Loc suture around the G-tube in an inner fashion. I placed the G-tube through the gastrotomy in direct fashion.  I was able to get my scrub to flush saline via the G-tube confirming the proper position of the tube intraluminally.  The balloon was inflated and pulled back.  We then were able to tied the first pursestring in the standard fashion and completing our circumferential outer pursestring with the 2 V-Loc sutures Inspection of the  upper quadrant was performed. No bleeding, bile duct injury or leak, or bowel injury was noted.  All the needles were removed under direct visualization. Robotic instruments and robotic arms were undocked in the standard fashion.  I scrubbed back in. Liposomal Marcaine  was used to infiltrate the abdominal wall at all incision sites   Pneumoperitoneum was released.  Bumper was placed against abdominal wall. 4-0 subcuticular Monocryl was used to close the skin. Dermabond was  applied.  The patient was then extubated and brought to the recovery room in stable condition. Sponge, lap, and needle counts were correct at closure and at the conclusion of the case.               Evelia Hipp, MD, FACS

## 2023-09-30 NOTE — Anesthesia Procedure Notes (Signed)
 Procedure Name: Intubation Date/Time: 09/30/2023 7:39 AM  Performed by: Rodney Clamp, CRNAPre-anesthesia Checklist: Patient identified, Emergency Drugs available, Suction available and Patient being monitored Patient Re-evaluated:Patient Re-evaluated prior to induction Oxygen Delivery Method: Circle system utilized Preoxygenation: Pre-oxygenation with 100% oxygen Induction Type: IV induction Ventilation: Mask ventilation without difficulty Laryngoscope Size: McGrath and 4 Grade View: Grade I Tube type: Oral Tube size: 7.0 mm Number of attempts: 1 Airway Equipment and Method: Stylet and Video-laryngoscopy Placement Confirmation: ETT inserted through vocal cords under direct vision, positive ETCO2 and breath sounds checked- equal and bilateral Secured at: 21 cm Tube secured with: Tape Dental Injury: Teeth and Oropharynx as per pre-operative assessment

## 2023-09-30 NOTE — Anesthesia Postprocedure Evaluation (Signed)
 Anesthesia Post Note  Patient: Ricky [name]  Procedure(s) Performed: INSERTION, GASTROSTOMY TUBE, ROBOT-ASSISTED (Abdomen)  Patient location during evaluation: PACU Anesthesia Type: General Level of consciousness: awake and alert Pain management: pain level controlled Vital Signs Assessment: post-procedure vital signs reviewed and stable Respiratory status: spontaneous breathing, nonlabored ventilation, respiratory function stable and patient connected to nasal cannula oxygen Cardiovascular status: blood pressure returned to baseline and stable Postop Assessment: no apparent nausea or vomiting Anesthetic complications: no   No notable events documented.   Last Vitals:  Vitals:   09/30/23 1039 09/30/23 1054  BP: 139/78 130/72  Pulse: 66 75  Resp: 12 14  Temp: (!) 36.1 C (!) 36.1 C  SpO2: 96% 99%    Last Pain:  Vitals:   09/30/23 1054  TempSrc: Temporal  PainSc:                  Portia Brittle Jordynn Marcella

## 2023-09-30 NOTE — Transfer of Care (Signed)
 Immediate Anesthesia Transfer of Care Note  Patient: Ricky [name]  Procedure(s) Performed: INSERTION, GASTROSTOMY TUBE, ROBOT-ASSISTED (Abdomen)  Patient Location: PACU  Anesthesia Type:General  Level of Consciousness: awake, drowsy, and patient cooperative  Airway & Oxygen Therapy: Patient Spontanous Breathing and Patient connected to face mask oxygen  Post-op Assessment: Report given to RN and Post -op Vital signs reviewed and stable  Post vital signs: Reviewed and stable  Last Vitals:  Vitals Value Taken Time  BP 144/80 09/30/23 0906  Temp    Pulse 74 09/30/23 0910  Resp 13 09/30/23 0910  SpO2 100 % 09/30/23 0910  Vitals shown include unfiled device data.  Last Pain:  Vitals:   09/30/23 0700  TempSrc: Temporal  PainSc: 0-No pain         Complications: No notable events documented.

## 2023-09-30 NOTE — Telephone Encounter (Signed)
 Pam said that she sent a message to Ardie Beckmann so that she can get the papers and get it signed so Pam can get started with his enteral feeding.  I spoke to Pattie Borders she has the papers she got Josh to sign it and she will be faxing it in just a few minutes. Pam will be looking for it

## 2023-09-30 NOTE — Interval H&P Note (Signed)
 History and Physical Interval Note:  09/30/2023 7:19 AM  Ricky [name]  has presented today for surgery, with the diagnosis of Malnutrition Squamous cell carcinoma of oropharynx.  The various methods of treatment have been discussed with the patient and family. After consideration of risks, benefits and other options for treatment, the patient has consented to  Procedure(s): INSERTION, GASTROSTOMY TUBE, ROBOT-ASSISTED (N/A) as a surgical intervention.  The patient's history has been reviewed, patient examined, no change in status, stable for surgery.  I have reviewed the patient's chart and labs.  Questions were answered to the patient's satisfaction.     Demarlo Riojas F Alejo Beamer

## 2023-09-30 NOTE — Discharge Instructions (Addendum)
 How to Care for a Feeding Tube A feeding tube is a soft, flexible tube used to give medicine, water , and liquid food. A person may need a feeding tube if they have trouble swallowing or cannot have food or medicine by mouth. The tube is put right into the stomach.  The following information gives steps on how to care for the skin around a feeding tube, or the tube site. This information is meant for adults and children over 66 year of age. Supplies needed: Clean washcloth, gauze pads, or soft paper towels. Cotton swabs. Skin barrier ointment or cream, such as petroleum jelly. Soap and water , or sterile saline. Pre-cut foam pads or gauze for around the tube. Medical tape. Anchoring device. This is not always used. Syringe. Cleaning brush or toothbrush. This is only used for cleanings. How to care for the tube site  Have all supplies ready and near you. Wash your hands with soap and water  for at least 20 seconds. Remove the foam pad or gauze under the tube stabilizing disc (bumper), if there is one. You may have to do this a few times a day right after the feeding tube is put in because the gauze or pad will get soiled or wet. As the site heals, you may not have to replace the pads or gauze as often. But you still need to clean and check the area every day. Gently turn the bumper so it does not stick to the skin. Check the skin around the tube site for redness, rash, swelling, drainage, or growth of extra tissue. Check the number on the tube (guide mark) where it meets the skin. It should not change. If it does, the feeding tube might be coming out. Moisten gauze pads and cotton swabs with water  and soap, or saline. Take the moistenedcotton swab and wipe under the bumper, right near the opening in the belly (stoma). Take the moistened gauze pad and clean the skin around the tube site. If you used soap, rinse with water . Use a washcloth, dry gauze pad, or soft paper towel to dry the skin and  stoma site. Dry the tube and bumper too. If the skin is red, put a barrier cream or ointment on a cotton swab. Apply it around the site, under the bumper. This will help the site heal. Put a new pre-cut foam pad or gauze around the tube, under the bumper. If the site is healed and there is no drainage, you can leave off the foam pads or gauze. Put tape around the edges of the foam pad or gauze to keep it in place. Use tape or an anchoring device to hold the loose end of the tube against the skin. This helps keep the tube from getting pulled on. Change where you put the tape to avoid damaging the skin. Throw away used supplies. Wash your hands with soap and water  for at least 20 seconds. How to care for the moat If the end of the feeding tube has a moat, clean it once a day or as told. The moat is the open space inside the feeding tube connector. Follow the manufacturer's instructions on how to clean the moat. You may be told to: Remove the cap from the end of the feeding tube. Cover the hole in the middle of the tube port with a cleaning brush. Hold the end of the tube over a deep bowl, or wrap it with paper towels or a washcloth to soak up the water . Use  a syringe of water  to flush out the moat. Use the cleaning brush or toothbrush to clean the tube cap and around the inside of the moat. This brush can only be used for tube cleanings. Dry the end of the feeding tube and the cap with gauze or paper towel. Put the cap back on the feeding tube. General tips Use, clean, and reuse feeding tube equipment only as told by the health care provider. Do not use antibiotic ointment or cream on the feeding tube site unless you're told to. Contact a health care provider if: You notice a change in the guide marks on the feeding tube where it meets the skin. Changes could mean the feeding tube has moved or is coming out. You see any of these on the skin around the tube  site: Redness. Rash. Swelling. Drainage. Extra growth of tissue. You have questions or concerns about the feeding tube or the tube site. This information is not intended to replace advice given to you by your health care provider. Make sure you discuss any questions you have with your health care provider. Document Revised: 08/27/2022 Document Reviewed: 08/27/2022 Elsevier Patient Education  2024 [employer]  How to Use a Pump to Give a Tube Feeding A feeding tube is a soft, flexible tube used to give medicine, water , and liquid food. A person may need a feeding tube if they have trouble swallowing or cannot have food or medicine by mouth. The tube is put right into the stomach.  The following information gives steps on how to use a pump to give liquid food, or formula, through a feeding tube. This information is meant for adults and children over 66 year of age. Supplies needed: Prescribed formula. Make sure the formula is at room temperature before using it. Feeding bag and tubing set. Feeding tube pump. Pole to hang feeding bag. How to use a pump to give a tube feeding  Prepare to give the feeding Wash your hands with soap and water  for at least 20 seconds. Have all supplies ready and near you. Use pillows to position or sit up at a 30- to 45-degree angle or higher as told by the health care provider. Staying upright during the feeding and for at least 30-60 minutes after the feeding can prevent food from flowing back (reflux). It can also help so a person doesn't feel like throwing up. Fill feeding bag with formula Close the clamp on the feeding set tubing. Pour the prescribed amount of formula into the feeding bag. If you have a ready-to-hang formula container, connect a tubing set to it. Hang the bag on the pole. Formula prepared in a sterile way can hang for up to 8 hours, or as noted in the manufacturer's instructions. Fill the feeding set tubing with formula. To do  this: Loosen or take off the cap at the end of the feeding set tubing. Open the clamp on the tubing. Let the formula go through to the end of the tubing. Close the clamp on the tubing. Put the cap back on the tubing. Prep the feeding tube and pump Put the feeding set tubing into the pump. Take the cap off the feeding tube. If it's a button instead of a tube, put the adapter into it. Open the clamp on the feeding tube. Flush and check placement of the feeding tube as told. Clamp the feeding tube. Deliver feeding  Take the cap off the feeding set tubing. Connect the feeding set tubing to  the feeding tube. Make sure there are no twists, bends, or kinks in the tubing. Set the pump at the rate you were told to use. Open the clamp on the feeding set tubing. Open the clamp on thefeeding tube. Start the pump. After all the formula has gone in, turn off the pump. Clamp the feeding set tubing. Clamp thefeeding tube. Disconnect the feeding set from the feeding tube Disconnect the feeding set tubing from thefeeding tube. Flush thefeeding tube as told. Clamp thefeeding tube. You must close or clamp the tube to: Keep it from leaking. Keep air from getting into the stomach. Put the cap back on the end of thefeeding tube. Keep a cap on the tube to keep germs from getting in it. Remove the feeding set tubing from the pump. Throw it away or clean it as told. Take the feeding bag off the pole. Throw it away or clean it as told. Wash your hands with soap and water  for at least 20 seconds. General tips Use, clean, and reuse feeding tube equipment only as told by the provider. Always make sure the feeding set tubing does not get tangled. Keep it away from the body. Strangulation can happen if tubing wraps around the neck and stops the breathing. You can store any unused, open formula in the refrigerator for up to 24 hours. Follow the provider's instructions on how and when to vent or open the feeding  tube. Contact a health care provider if: You're having trouble doing a tube feeding. You do not know what type of formula to use. You have problems with the pump. The feeding tube is clogged, comes out, or does not work. There are any of these problems: Throwing up or feeling like throwing up. Fever. Watery poop (diarrhea) or trouble pooping (constipation). A large, bloated stomach. You have any questions or concerns about the feeding tube, feedings, or the pump. This information is not intended to replace advice given to you by your health care provider. Make sure you discuss any questions you have with your health care provider. Document Revised: 08/27/2022 Document Reviewed: 08/27/2022 Elsevier Patient Education  2024 [employer].

## 2023-10-01 ENCOUNTER — Ambulatory Visit: Admitting: Nutrition

## 2023-10-01 ENCOUNTER — Encounter: Payer: Self-pay | Admitting: Surgery

## 2023-10-01 ENCOUNTER — Telehealth: Payer: Self-pay | Admitting: Nutrition

## 2023-10-01 LAB — BPAM RBC
Blood Product Expiration Date: 202506132359
Unit Type and Rh: 6200

## 2023-10-01 LAB — TYPE AND SCREEN
ABO/RH(D): A POS
Antibody Screen: NEGATIVE
Unit division: 0

## 2023-10-01 LAB — PREPARE RBC (CROSSMATCH)

## 2023-10-01 NOTE — Telephone Encounter (Signed)
 I also called over to the patient's wife and she told me that Ricky [name] the other tradition person has called her and she feels better now that what she is doing

## 2023-10-01 NOTE — Progress Notes (Signed)
 Patient's wife Ricky [name] called with questions regarding feeding tube and feeding regimen.  I returned Ricky [name]'s call.  First concern voiced was that she was unsure whether she should use Osmolite 1.5 or Nutren 1.5 to begin feedings.  Second concern was patient's pain level.  She has given 1 dosage of oxycodone  this morning at 7 and patient complaining of severe pain around feeding tube and that medication had no effect.  Third concern received call from [facility] but was not clear why they were calling.  Instructed Ricky [name] to begin tube feeding regimen per Ricky [name]'s recommendation using Osmolite 1.5.  Ricky [name] able to verbalize free water  flushes and tube feeding administration and states she is comfortable beginning tube feedings today.  Contacted Ricky [name] via secure chat regarding patient's pain level.  Relayed information to patient's wife to repeat 1 oxycodone  now for pain.  Confirmed with patient's wife that feeding tube did not have discharge or redness or swelling.  Wife planning on cleaning feeding tube this morning before first feeding.  Ricky clarified that Ricky [name] was contacted for nursing assistance with feeding tube and tube feeding administration.  Explained to Ricky [name] if they send a nurse they are trained and can assist with tube feeding if needed.    Ricky [name] contacted me a second time while documenting this note to ask which port to use for the feeding and water  flush. Instructed to use the Gastric/feed port.   Ricky [name] verbalized understanding of all instructions. Provided support. Has this RD number for any further questions today.  **Disclaimer: This note was dictated with voice recognition software. Similar sounding words can inadvertently be transcribed and this note may contain transcription errors which may not have been corrected upon publication of note.**

## 2023-10-01 NOTE — Telephone Encounter (Signed)
 See telephone note

## 2023-10-02 NOTE — Progress Notes (Unsigned)
 [facility] MD/PA/NP OP Progress Note  10/07/2023 2:12 PM Caidon Foti  MRN:  [id number]  Chief Complaint:  Chief Complaint  Patient presents with   Follow-up   HPI:  According to the chart review, the following events have occurred since the last visit: The patient underwent G tube placement.   This is a follow-up appointment for depression, anxiety and insomnia. The majority of the history is obtained from [name], partly due to difficulty in communication related to hearing loss.   [name], his significant other presents with him. She states that they have been feeling sick.  She suffers from sinus infection, and feels worn out.  She states that it has been difficult for her as well.  He has been ugly due to the way he feels. He called her "ugly," and "fat." Although Gwynn Lesches denies saying those, [name] states that he said these. It has been very hard for her.  She also reports that he has pain after having PEG tube. Although she communicated this concern to the provider, they think OxyContin  will cover this.  She believes it does not make any difference.  She is also concerned about getting additional supplements in his medication regimen, and she would refused to do so from PEG tube.  She agrees to discuss these concern with his providers.  She states that he was able to enjoy minestrone soup, and some dishes with shrimp when then went to a restaurant. She tearfully described that the server gave him a soup for free.  She reports frustration against his family member.  She states that she has not been able to attend to her own appointments.  Although his mother gave her promise that she will do so, she later told [name] that she will be visiting her family in [place]  (who has no physical concerns).  She does not like him to be treated that way, referring to her own experience. She reports frustration that he is unwilling to discuss concerns after leaving the situation. Psychoeducation was provided on differences  in communication styles. Couples therapy was recommended as a potential option, should they be open to it in the future.  He states that he feels "rotten".  He agrees that he might take things out on [name].  He agrees to step away from the situation and return to communicate when he feels more ready to address the concern.  He states that he has decrease in appetite as it is like cardboard.  He also states that the tongue is apparent from radiation, and it causes significant pain.  When he is asked about his family, he states that he is used to this, although he agrees that he feels sad.  He denies any significant anxiety since the previous visit (according to [name], he had intense anxiety when he underwent PEG placement).  He sleeps better, and he feels the current medication regimen has been helping.  He denies SI.  He denies hallucinations. He had an episode of weakness, but denies dizziness/fall.    Wt Readings from Last 3 Encounters:  10/07/23 148 lb (67.1 kg)  10/05/23 145 lb (65.8 kg)  09/30/23 149 lb 11.1 oz (67.9 kg)    08/10/23 163 lb 9.6 oz (74.2 kg)  08/09/23 162 lb 7.7 oz (73.7 kg)  07/26/23 165 lb 4.8 oz (75 kg)     Visit Diagnosis:    ICD-10-CM   1. MDD (major depressive disorder), recurrent episode, mild (HCC)  F33.0     2. Anxiety state  F41.1     3. Insomnia, unspecified type  G47.00       Past Psychiatric History: Please see initial evaluation for full details. I have reviewed the history. No updates at this time.     Past Medical History:  Past Medical History:  Diagnosis Date   Anemia    Anxiety    Arthritis    Carpal tunnel syndrome    Depression    Diabetes (HCC)    Hyperlipidemia    Hypertension    Loss of teeth due to extraction    Neoplasm related pain    Parkinson's disease [facility])     Past Surgical History:  Procedure Laterality Date   CARPAL TUNNEL RELEASE Right 11/28/2020   Procedure: CARPAL TUNNEL RELEASE ENDOSCOPIC;  Surgeon: Elner Hahn,  MD;  Location: [facility] ORS;  Service: Orthopedics;  Laterality: Right;   CARPAL TUNNEL RELEASE Left 01/22/2021   Procedure: CARPAL TUNNEL RELEASE ENDOSCOPIC;  Surgeon: Elner Hahn, MD;  Location: [facility] ORS;  Service: Orthopedics;  Laterality: Left;   INSERTION, GASTROSTOMY TUBE, ROBOT-ASSISTED N/A 09/30/2023   Procedure: INSERTION, GASTROSTOMY TUBE, ROBOT-ASSISTED;  Surgeon: Alben Alma, MD;  Location: [facility] ORS;  Service: General;  Laterality: N/A;   IR IMAGING GUIDED PORT INSERTION  06/30/2023   KNEE ARTHROSCOPY Right    KNEE ARTHROSCOPY Left    MANDIBLE FRACTURE SURGERY     as teenager    Family Psychiatric History: Please see initial evaluation for full details. I have reviewed the history. No updates at this time.     Family History:  Family History  Problem Relation Age of Onset   Diabetes Mother    Hypertension Mother    CVA Father    Heart attack Father    Depression Sister    Depression Brother     Social History:  Social History   Socioeconomic History   Marital status: Significant Other    Spouse name: [name]   Number of children: Not on file   Years of education: Not on file   Highest education level: Not on file  Occupational History   Not on file  Tobacco Use   Smoking status: Never    Passive exposure: Never   Smokeless tobacco: Never  Vaping Use   Vaping status: Never Used  Substance and Sexual Activity   Alcohol use: Not Currently   Drug use: Never   Sexual activity: Not Currently    Birth control/protection: None  Other Topics Concern   Not on file  Social History Narrative   Lives with friend   Social Drivers of Health   Financial Resource Strain: Low Risk  (09/29/2023)   Overall Financial Resource Strain (CARDIA)    Difficulty of Paying Living Expenses: Not hard at all  Food Insecurity: No Food Insecurity (06/22/2023)   Hunger Vital Sign    Worried About Running Out of Food in the Last Year: Never true    Ran Out of Food in the Last Year:  Never true  Transportation Needs: No Transportation Needs (06/22/2023)   PRAPARE - Administrator, Civil Service (Medical): No    Lack of Transportation (Non-Medical): No  Physical Activity: Inactive (09/29/2023)   Exercise Vital Sign    Days of Exercise per Week: 0 days    Minutes of Exercise per Session: 0 min  Stress: No Stress Concern Present (09/29/2023)   [employer] of Occupational Health - Occupational Stress Questionnaire    Feeling of Stress : Not at  all  Social Connections: Moderately Isolated (09/29/2023)   Social Connection and Isolation Panel [NHANES]    Frequency of Communication with Friends and Family: Three times a week    Frequency of Social Gatherings with Friends and Family: Twice a week    Attends Religious Services: Never    Database administrator or Organizations: No    Attends Banker Meetings: Never    Marital Status: Married    Allergies:  Allergies  Allergen Reactions   Mirtazapine Rash   Other Itching and Rash    ETCO2 tubing. Medtronic Microstream advance    Metabolic Disorder Labs: Lab Results  Component Value Date   HGBA1C 5.2 08/25/2021   MPG 102.54 08/25/2021   No results found for: "PROLACTIN" No results found for: "CHOL", "TRIG", "HDL", "CHOLHDL", "VLDL", "LDLCALC" Lab Results  Component Value Date   TSH 2.016 09/14/2023   TSH 2.715 08/13/2022    Therapeutic Level Labs: No results found for: "LITHIUM" No results found for: "VALPROATE" No results found for: "CBMZ"  Current Medications: Current Outpatient Medications  Medication Sig Dispense Refill   ACCU-CHEK GUIDE TEST test strip TEST 2X DAILY     Accu-Chek Softclix Lancets lancets 2 (two) times daily.     Blood Glucose Monitoring Suppl (ACCU-CHEK GUIDE ME) w/Device KIT See admin instructions.     buPROPion  (WELLBUTRIN  XL) 150 MG 24 hr tablet Take 1 tablet (150 mg total) by mouth daily. Total of 450 mg daily. Take along with 300 mg tab 30 tablet 5    buPROPion  (WELLBUTRIN  XL) 300 MG 24 hr tablet Take 1 tablet (300 mg total) by mouth daily. Total of 450 mg daily. Take along with 150 mg tab 30 tablet 5   [START ON 10/23/2023] busPIRone  (BUSPAR ) 7.5 MG tablet Take 1 tablet (7.5 mg total) by mouth 2 (two) times daily. 60 tablet 3   calcium  carbonate (CALCIUM  600) 600 MG TABS tablet Take 1 tablet (600 mg total) by mouth daily. For low calcium  60 tablet 0   carbidopa -levodopa  (SINEMET  IR) 25-100 MG tablet Take 2-3 tablets by mouth See admin instructions. Take 3 tablets in morning 2 tablet at bedtime     [START ON 10/12/2023] clonazePAM  (KLONOPIN ) 1 MG tablet Take 1 tablet (1 mg total) by mouth 3 (three) times daily as needed for anxiety. 90 tablet 1   cyanocobalamin  (VITAMIN B12) 1000 MCG tablet Take 1,000 mcg by mouth daily.     fentaNYL  (DURAGESIC ) 12 MCG/HR Place 1 patch onto the skin every 3 (three) days. 10 patch 0   folic acid  (FOLVITE ) 1 MG tablet Take 1 tablet (1 mg total) by mouth daily. 30 tablet 3   ketoconazole (NIZORAL) 2 % shampoo Apply 1 application  topically every other day.     lidocaine -prilocaine  (EMLA ) cream Apply to affected area once 30 g 3   magnesium  chloride (SLOW-MAG) 64 MG TBEC SR tablet Take 2 tablets (128 mg total) by mouth 2 (two) times daily. For low magnesium  120 tablet 00   metFORMIN  (GLUCOPHAGE ) 500 MG tablet Take 500 mg by mouth daily with breakfast.     Nutritional Supplements (NUTREN 1.5) LIQD Give 1 carton in feeding tube via bolus syringe 5 times a day.  Flush with 60cc of water  before and 120cc of water  after.     ondansetron  (ZOFRAN ) 8 MG tablet Take 1 tablet (8 mg total) by mouth every 8 (eight) hours as needed for nausea or vomiting. Start on the third day after cisplatin . 30 tablet  1   Oxycodone  HCl 10 MG TABS Take 1 tablet (10 mg total) by mouth every 4 (four) hours as needed. 60 tablet 0   prochlorperazine  (COMPAZINE ) 10 MG tablet TAKE 1 TABLET(10 MG) BY MOUTH EVERY 6 HOURS AS NEEDED FOR NAUSEA OR VOMITING 30  tablet 1   rosuvastatin  (CRESTOR ) 20 MG tablet Take 20 mg by mouth daily.     sertraline  (ZOLOFT ) 100 MG tablet Take 1.5 tablets (150 mg total) by mouth at bedtime. 45 tablet 3   sucralfate  (CARAFATE ) 1 g tablet Take 1 tablet (1 g total) by mouth 3 (three) times daily before meals. Dissolve in 4 tbs of warm water , swish and swallow (Patient taking differently: Take 1 g by mouth 3 (three) times daily with meals as needed (Numb mouth). Dissolve in 4 tbs of warm water , swish and swallow) 90 tablet 1   tadalafil (CIALIS) 5 MG tablet Take 5 mg by mouth daily.     tamsulosin (FLOMAX) 0.4 MG CAPS capsule Take 0.4 mg by mouth daily.     [START ON 10/13/2023] traZODone  (DESYREL ) 50 MG tablet Take 0.5-2 tablets (25-100 mg total) by mouth at bedtime as needed for sleep. 60 tablet 3   No current facility-administered medications for this visit.     Musculoskeletal: Strength & Muscle Tone: within normal limits Gait & Station: normal Patient leans: N/A  Psychiatric Specialty Exam: Review of Systems  Psychiatric/Behavioral:  Positive for dysphoric mood. Negative for agitation, behavioral problems, confusion, decreased concentration, hallucinations, self-injury, sleep disturbance and suicidal ideas. The patient is nervous/anxious. The patient is not hyperactive.   All other systems reviewed and are negative.   Blood pressure 125/75, pulse 72, temperature (!) 97 F (36.1 C), temperature source Temporal, height 5\' 9"  (1.753 m), weight 148 lb (67.1 kg).Body mass index is 21.86 kg/m.  General Appearance: Well Groomed  Eye Contact:  Good  Speech:  Clear and Coherent  Volume:  Normal  Mood:  tired  Affect:  Appropriate, Congruent, and Restricted  Thought Process:  Coherent  Orientation:  Full (Time, Place, and Person)  Thought Content: Logical   Suicidal Thoughts:  No  Homicidal Thoughts:  No  Memory:  Immediate;   Good  Judgement:  Good  Insight:  Good  Psychomotor Activity:  Normal  Concentration:   Concentration: Good and Attention Span: Good  Recall:  Good  Fund of Knowledge: Good  Language: Good  Akathisia:  No  Handed:  Right  AIMS (if indicated): not done  Assets:  Communication Skills Desire for Improvement  ADL's:  Intact  Cognition: WNL  Sleep:  Fair   Screenings: ECT-MADRS    Flowsheet Row ECT Treatment from 07/28/2021 in [facility]  MADRS Total Score 41      GAD-7    Flowsheet Row Counselor from 09/08/2023 in [facility] Office Visit from 02/25/2023 in [facility] Office Visit from 01/07/2023 in [facility] Office Visit from 10/12/2022 in [facility] Office Visit from 06/16/2022 in [facility]  Total GAD-7 Score 20 18 18 12 20       Mini-Mental    Flowsheet Row ECT Treatment from 07/28/2021 in [facility]  Total Score (max 30 points ) 30      PHQ2-9    Flowsheet Row Counselor from 09/08/2023 in [facility] Office Visit from 06/22/2023  in [facility] Office Visit from 02/25/2023 in [facility] Office Visit from 01/07/2023 in [facility] Office Visit from 10/12/2022 in [facility]  PHQ-2 Total Score 3 6 5 6 4   PHQ-9 Total Score 18 -- 17 22 11       Flowsheet Row Admission (Discharged) from 09/30/2023 in [facility] Counselor from 09/08/2023 in [facility] Office Visit from 03/31/2022 in [facility]  C-SSRS RISK CATEGORY No Risk No Risk No Risk        Assessment and  Plan:  Omeed Osuna is a 68 y.o. 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 diabetes, hypertension, hyperlipidemia, carpal tunnel syndrome, s/p knee arthroscopy , who presents for follow up appointment for below.   1. MDD (major depressive disorder), recurrent episode, mild (HCC) 2. 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 depression since 1979, ECT in 2023 with limited benefit, originally on duloxetine  60 mg daily, bupropion  150 mg daily Abilify  30 mg daily        The exam is notable for restricted affect, and he reports exhaustion, decrease in appetite, which is multifactorial given his medical condition, which includes completing cisplatin  treatment, recent PEG placement.  Both the patient and his significant other agreed to stay on the current medication regimen at this time to avoid any possible adverse reaction/relapse, with plans to reevaluate once his condition stabilizes.  Will continue sertraline  to target depression and anxiety.  Will continue bupropion  adjunctive treatment for depression, and BuSpar  for anxiety.   Noted that he is not a good candidate for stimulant due to history of adverse reaction of restlessness in the past.  The treatment option is also quite limited due to history of parkinsonian symptoms.  Although he was previously accepted TMS treatment, this is on hold due to the current medical condition.   Of note, during the visit, concerns were raised regarding marital communication, as his significant other appears to be feeling very overwhelmed by his current condition. His wife also expressed concern about how his family, particularly his mother has been treating him during this  time, though he reports being accustomed to it. He would likely benefit greatly from CBT and having a safe space to explore these issues. He agrees to keep an upcoming appointment.    # benzodiazepine dependence (prescribed) - tapered down from 1 mg TID since April 2023, which was uptitrated back Feb 2025 Although he was adjusting well to tapering down medication 2 years ago, his anxiety has worsened in the setting of cancer diagnosis.  He reports good benefit since uptitration without any side effects of drowsiness, oversedation or fall.  Both the patient and his wife is aware of potential risk of respiratory suppression with concomitant use of opioid.    # Insomnia 3. Fatigue, unspecified type 4. Obstructive sleep apnea # vitamin D  deficiency - According to the recent sleep study, he has severe obstructive sleep apnea, AHI of 31.  - Ritalin  5 mg did not make any difference, but cause adverse reaction of restlessness.  Slight improvement in  insomnia. Fatigue is multifactorial due to current medical condition.  He reports good benefit from trazodone , an additional dose of clonazepam  at night.  Will continue current medication regimen while monitoring any risk of fall, and confusion.     5. Cognitive deficits Functional Status   IADL: Independent in the following:driving (advised to refrain from driving 05/6107)           Requires assistance with the following:  managing finances, medications (using pill box, his wife checks) ADL  Independent in the following: bathing and hygiene, feeding, continence, grooming and toileting, walking          Requires assistance with the following: Folate, Vitamin B12, TSH- low folic acid  08/2022. Otherwise wnl Images Brain MRI 08/2021: There is no evidence of an acute infarct, intracranial hemorrhage, mass, midline shift, or extra-axial fluid collection.Small T2 hyperintensities in the cerebral white matter bilaterally are nonspecific but compatible with mild  chronic small vessel ischemic disease. Dilated perivascular spaces are noted in the basal ganglia bilaterally. There is mild generalized cerebral atrophy. Neuropsych assessment:  Etiology: parkinson, r/o VaD   He denies any subjective symptoms of memory loss.  However, his IADL is somewhat limited. Will continue to assess this.   Plan  Continue sertraline  150 mg at night (limited benefit from higher dose) Continue bupropion  450 mg daily Continue Buspar  7.5 mg twice a day (restless leg from 10 mg BID) Continue trazodone  100 mg at night as needed for insomnia Continue clonazepam  1 mg three times a day Next appointment-  7/14 at 11:30  IP, or contact for sooner appointment They are advised to contact the sleep clinic regarding the result - TSH- 4.238 10/2022   Past trials of medication (the following medication caused either side effect, or limited benefit): lexapro, duloxetine , venlafaxine, mirtazapine (rash), Abilify , quetiapine  (insomnia, RLS)   The patient demonstrates the following risk factors for suicide: Chronic risk factors for suicide include: psychiatric disorder of depression and chronic pain. Acute risk factors for suicide include: unemployment. Protective factors for this patient include: positive social support, coping skills and hope for the future. Considering these factors, the overall suicide risk at this point appears to be low. Patient is appropriate for outpatient follow up.  A total of 50 minutes was spent on the following activities during the encounter date, which includes but is not limited to: preparing to see the patient (e.g., reviewing tests and records), obtaining and/or reviewing separately obtained history, performing a medically necessary examination or evaluation, counseling and educating the patient, family, or caregiver, ordering medications, tests, or procedures, referring and communicating with other healthcare professionals (when not reported separately),  documenting clinical information in the electronic or paper health record, independently interpreting test or lab results and communicating these results to the family or caregiver, and coordinating care (when not reported separately). Time was spent addressing concerns raised by the patient's wife regarding communication and emotional support, as part of ongoing management of the patient's chronic mental health condition.   This visit involved a longitudinal and complex condition requiring extended medical decision-making, coordination of care, and management beyond what is typically captured in CPT 99214. The complexity of the patient's condition justifies the use of G2211.   Collaboration of Care: Collaboration of Care: Other reviewed notes in Epic, collaborating with [name], his significant other  Patient/Guardian was advised Release of Information must be obtained prior to any record release in order to collaborate their care with an outside provider. Patient/Guardian was advised if they have not  already done so to contact the registration department to sign all necessary forms in order for us  to release information regarding their care.   Consent: Patient/Guardian gives verbal consent for treatment and assignment of benefits for services provided during this visit. Patient/Guardian expressed understanding and agreed to proceed.    Todd Fossa, MD 10/07/2023, 2:12 PM

## 2023-10-05 ENCOUNTER — Inpatient Hospital Stay

## 2023-10-05 ENCOUNTER — Telehealth: Payer: Self-pay | Admitting: *Deleted

## 2023-10-05 ENCOUNTER — Inpatient Hospital Stay (HOSPITAL_BASED_OUTPATIENT_CLINIC_OR_DEPARTMENT_OTHER): Admitting: Hospice and Palliative Medicine

## 2023-10-05 VITALS — BP 117/70 | HR 65 | Temp 97.6°F | Resp 16 | Ht 69.0 in | Wt 145.0 lb

## 2023-10-05 DIAGNOSIS — R29898 Other symptoms and signs involving the musculoskeletal system: Secondary | ICD-10-CM

## 2023-10-05 DIAGNOSIS — R051 Acute cough: Secondary | ICD-10-CM | POA: Diagnosis not present

## 2023-10-05 DIAGNOSIS — C109 Malignant neoplasm of oropharynx, unspecified: Secondary | ICD-10-CM | POA: Diagnosis not present

## 2023-10-05 DIAGNOSIS — Z95828 Presence of other vascular implants and grafts: Secondary | ICD-10-CM

## 2023-10-05 DIAGNOSIS — Z5111 Encounter for antineoplastic chemotherapy: Secondary | ICD-10-CM | POA: Diagnosis not present

## 2023-10-05 DIAGNOSIS — E860 Dehydration: Secondary | ICD-10-CM

## 2023-10-05 LAB — CMP (CANCER CENTER ONLY)
ALT: 9 U/L (ref 0–44)
AST: 17 U/L (ref 15–41)
Albumin: 4 g/dL (ref 3.5–5.0)
Alkaline Phosphatase: 44 U/L (ref 38–126)
Anion gap: 8 (ref 5–15)
BUN: 17 mg/dL (ref 8–23)
CO2: 26 mmol/L (ref 22–32)
Calcium: 8.6 mg/dL — ABNORMAL LOW (ref 8.9–10.3)
Chloride: 100 mmol/L (ref 98–111)
Creatinine: 1.45 mg/dL — ABNORMAL HIGH (ref 0.61–1.24)
GFR, Estimated: 53 mL/min — ABNORMAL LOW (ref >60)
Glucose, Bld: 99 mg/dL (ref 70–99)
Potassium: 4.4 mmol/L (ref 3.5–5.1)
Sodium: 134 mmol/L — ABNORMAL LOW (ref 135–145)
Total Bilirubin: 0.9 mg/dL (ref 0.0–1.2)
Total Protein: 6.7 g/dL (ref 6.5–8.1)

## 2023-10-05 LAB — RESPIRATORY PANEL BY PCR

## 2023-10-05 LAB — CBC WITH DIFFERENTIAL (CANCER CENTER ONLY)
Abs Immature Granulocytes: 0.06 10*3/uL (ref 0.00–0.07)
Basophils Absolute: 0 10*3/uL (ref 0.0–0.1)
Basophils Relative: 1 %
Eosinophils Absolute: 0.1 10*3/uL (ref 0.0–0.5)
Eosinophils Relative: 4 %
HCT: 23 % — ABNORMAL LOW (ref 39.0–52.0)
Hemoglobin: 7.9 g/dL — ABNORMAL LOW (ref 13.0–17.0)
Immature Granulocytes: 2 %
Lymphocytes Relative: 8 %
Lymphs Abs: 0.3 10*3/uL — ABNORMAL LOW (ref 0.7–4.0)
MCH: 31.2 pg (ref 26.0–34.0)
MCHC: 34.3 g/dL (ref 30.0–36.0)
MCV: 90.9 fL (ref 80.0–100.0)
Monocytes Absolute: 0.3 10*3/uL (ref 0.1–1.0)
Monocytes Relative: 9 %
Neutro Abs: 2.8 10*3/uL (ref 1.7–7.7)
Neutrophils Relative %: 76 %
Platelet Count: 99 10*3/uL — ABNORMAL LOW (ref 150–400)
RBC: 2.53 MIL/uL — ABNORMAL LOW (ref 4.22–5.81)
RDW: 15.7 % — ABNORMAL HIGH (ref 11.5–15.5)
WBC Count: 3.7 10*3/uL — ABNORMAL LOW (ref 4.0–10.5)
nRBC: 0 % (ref 0.0–0.2)

## 2023-10-05 LAB — PHOSPHORUS: Phosphorus: 3.9 mg/dL (ref 2.5–4.6)

## 2023-10-05 LAB — MAGNESIUM: Magnesium: 1.6 mg/dL — ABNORMAL LOW (ref 1.7–2.4)

## 2023-10-05 MED ORDER — SODIUM CHLORIDE 0.9 % IV SOLN
INTRAVENOUS | Status: DC
  Filled 2023-10-05 (×2): qty 250

## 2023-10-05 MED ORDER — SLOW-MAG 71.5-119 MG PO TBEC: 2.0000 | DELAYED_RELEASE_TABLET | Freq: Two times a day (BID) | ORAL | Status: AC

## 2023-10-05 MED ORDER — HEPARIN SOD (PORK) LOCK FLUSH 100 UNIT/ML IV SOLN
500.0000 [IU] | Freq: Once | INTRAVENOUS | Status: AC
  Administered 2023-10-05: 500 [IU]
  Filled 2023-10-05: qty 5

## 2023-10-05 NOTE — Progress Notes (Signed)
 Nutrition Follow-up:  Patient with stage I SCC of oropharynx, p 16 +.  Patient has completed chemotherapy and radiation.  Last radiation was on 5/8.  Met with patient and wife, [name] for nutrition follow-up.  Reports that loose stool/diarrhea started on Saturday.  Diarrhea continued on Sunday, Monday and today.  Patient and wife also have upper respiratory illness that started over the weekend.  Patient has continued to take senokot and last dose was this am per wife.  Wife has been giving 60 cc of water  before and 120cc water  after each feeding.  Providing 2 full cartons of osmolite 1.5 via tube per wife.  Patient ate more orally (3 scrambled eggs and vegetable beef soup, tuna salad).  Says that eggs didn't taste like cardboard.  Wife concerned that patient is so weak.  Patient reports that sometimes legs get weak and he feels like battery is running out.    Wife confirms receiving shipment of formula and syringes from [employer].  Home Health nurse from [place] has also come out.      Medications: reviewed, confirms taking magnesium   Labs: K 4.4, phosphorus 3.9, Mag 1.6 (oral supplement increased today)  Anthropometrics:   Weight 145 lb today  UBW of 177 lb on 06/24/23    Estimated Energy Needs  Kcals: 1750-2100 Protein: 84-105 g Fluid: 1750-2100 ml  NUTRITION DIAGNOSIS: Inadequate oral intake continues   MALNUTRITION DIAGNOSIS: Severe malnutrition continues   INTERVENTION:  Magnesium  increased by NP today Patient to stop senokot due to diarrhea.  ? if respiratory infection/viral infection also effecting diarrhea Continue tube feeding titration pending tolerance.   Home Health PT being arranged today by [facility] team.     MONITORING, EVALUATION, GOAL: weight trends, intake, tube feeding tolerance   NEXT VISIT: phone call on Friday, May 30  Isreal Moline B. Zollie Hipp, CSO, LDN Registered Dietitian [phone]

## 2023-10-05 NOTE — Patient Instructions (Addendum)
 Stop taking Dulcolax  Start taking Slow Mag  - Take 2 tablets (128 mg total) by mouth 2 (two) times daily. For low magnesium 

## 2023-10-05 NOTE — Telephone Encounter (Signed)
 Called [facility] to add PT services to patient's [facility] orders. (Spoke with Chelsea at [phone]). Gasper Karst will view HH orders in Epic and contact pt w/apt.

## 2023-10-05 NOTE — Progress Notes (Signed)
 Patient is having some diarrhea. Cough and runny nose. The oxycodone  would work and its not, so they want to try Dilaudid. His pain level 5 today in his left side.

## 2023-10-05 NOTE — Progress Notes (Signed)
 Symptom Management Clinic [facility] at [facility] Telephone:([phone] Fax:([phone]  Patient Care Team: Melchor Spoon, MD as PCP - General (Internal Medicine) Avonne Boettcher, MD as Consulting Physician (Oncology)   NAME OF PATIENT: Ricky [name]  [id number]  1955/12/27   DATE OF VISIT: 10/05/23  REASON FOR CONSULT: Ricky [name] is a 68 y.o. 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   INTERVAL HISTORY: Patient saw Dr. Randy Buttery on 09/14/2023.  Patient is status post concurrent chemoradiation.  He completed cycle 7 cisplatin  on 09/14/2023.  Patient with persistent poor oral intake and weight loss.  Underwent PEG placement on 09/30/2023.    He presents to [facility] today for follow-up labs after starting nutrition.    Patient says he is tolerating nutrition reasonably well.  Has had some diarrhea.  Has had 3 to 4 days of upper respiratory symptoms with cough, runny nose, postnasal drip.  No fever or chills.  No shortness of breath or chest pain.  Denies urinary complaints. Patient offers no further specific complaints today.  PAST MEDICAL HISTORY: Past Medical History:  Diagnosis Date   Anemia    Anxiety    Arthritis    Carpal tunnel syndrome    Depression    Diabetes (HCC)    Hyperlipidemia    Hypertension    Loss of teeth due to extraction    Neoplasm related pain    Parkinson's disease (HCC)     PAST SURGICAL HISTORY:  Past Surgical History:  Procedure Laterality Date   CARPAL TUNNEL RELEASE Right 11/28/2020   Procedure: CARPAL TUNNEL RELEASE ENDOSCOPIC;  Surgeon: Elner Hahn, MD;  Location: [facility] ORS;  Service: Orthopedics;  Laterality: Right;   CARPAL TUNNEL RELEASE Left 01/22/2021   Procedure: CARPAL TUNNEL RELEASE ENDOSCOPIC;  Surgeon: Elner Hahn, MD;  Location: [facility] ORS;  Service: Orthopedics;  Laterality: Left;   INSERTION, GASTROSTOMY TUBE, ROBOT-ASSISTED  N/A 09/30/2023   Procedure: INSERTION, GASTROSTOMY TUBE, ROBOT-ASSISTED;  Surgeon: Alben Alma, MD;  Location: [facility] ORS;  Service: General;  Laterality: N/A;   IR IMAGING GUIDED PORT INSERTION  06/30/2023   KNEE ARTHROSCOPY Right    KNEE ARTHROSCOPY Left    MANDIBLE FRACTURE SURGERY     as teenager    HEMATOLOGY/ONCOLOGY HISTORY:  Oncology History  Squamous cell carcinoma of oropharynx (HCC)  06/22/2023 Initial Diagnosis   Squamous cell carcinoma of oropharynx (HCC)   06/22/2023 Cancer Staging   Staging form: Pharynx - HPV-Mediated Oropharynx, AJCC 8th Edition - Clinical stage from 06/22/2023: Stage I (cT2, cN1, cM0, p16+) - Signed by Avonne Boettcher, MD on 07/12/2023 Stage prefix: Initial diagnosis   07/12/2023 -  Chemotherapy   Patient is on Treatment Plan : HEAD/NECK Cisplatin  (40) q7d       ALLERGIES:  is allergic to mirtazapine and other.  MEDICATIONS:  Current Outpatient Medications  Medication Sig Dispense Refill   ACCU-CHEK GUIDE TEST test strip TEST 2X DAILY     Accu-Chek Softclix Lancets lancets 2 (two) times daily.     Blood Glucose Monitoring Suppl (ACCU-CHEK GUIDE ME) w/Device KIT See admin instructions.     buPROPion  (WELLBUTRIN  XL) 150 MG 24 hr tablet Take 1 tablet (150 mg total) by mouth daily. Total of 450 mg daily. Take along with 300 mg tab 30 tablet 5   buPROPion  (WELLBUTRIN  XL) 300 MG 24 hr tablet Take 1 tablet (300 mg total) by mouth  daily. Total of 450 mg daily. Take along with 150 mg tab 30 tablet 5   busPIRone  (BUSPAR ) 7.5 MG tablet Take 1 tablet (7.5 mg total) by mouth 2 (two) times daily. 60 tablet 3   calcium  carbonate (CALCIUM  600) 600 MG TABS tablet Take 1 tablet (600 mg total) by mouth daily. For low calcium  60 tablet 0   carbidopa -levodopa  (SINEMET  IR) 25-100 MG tablet Take 2-3 tablets by mouth See admin instructions. Take 3 tablets in morning 2 tablet at bedtime     clonazePAM  (KLONOPIN ) 1 MG tablet Take 1 tablet (1 mg total) by mouth 3 (three) times  daily as needed for anxiety. 90 tablet 0   cyanocobalamin  (VITAMIN B12) 1000 MCG tablet Take 1,000 mcg by mouth daily.     fentaNYL  (DURAGESIC ) 12 MCG/HR Place 1 patch onto the skin every 3 (three) days. 10 patch 0   folic acid  (FOLVITE ) 1 MG tablet Take 1 tablet (1 mg total) by mouth daily. 30 tablet 3   ketoconazole (NIZORAL) 2 % shampoo Apply 1 application  topically every other day.     lidocaine -prilocaine  (EMLA ) cream Apply to affected area once 30 g 3   magnesium  chloride (SLOW-MAG) 64 MG TBEC SR tablet Take 2 tablets (128 mg total) by mouth daily. For low magnesium  120 tablet 00   metFORMIN  (GLUCOPHAGE ) 500 MG tablet Take 500 mg by mouth daily with breakfast.     Nutritional Supplements (NUTREN 1.5) LIQD Give 1 carton in feeding tube via bolus syringe 5 times a day.  Flush with 60cc of water  before and 120cc of water  after.     ondansetron  (ZOFRAN ) 8 MG tablet Take 1 tablet (8 mg total) by mouth every 8 (eight) hours as needed for nausea or vomiting. Start on the third day after cisplatin . 30 tablet 1   Oxycodone  HCl 10 MG TABS Take 1 tablet (10 mg total) by mouth every 4 (four) hours as needed. 60 tablet 0   prochlorperazine  (COMPAZINE ) 10 MG tablet TAKE 1 TABLET(10 MG) BY MOUTH EVERY 6 HOURS AS NEEDED FOR NAUSEA OR VOMITING 30 tablet 1   rosuvastatin  (CRESTOR ) 20 MG tablet Take 20 mg by mouth daily.     sertraline  (ZOLOFT ) 100 MG tablet Take 1.5 tablets (150 mg total) by mouth at bedtime. 45 tablet 3   sucralfate  (CARAFATE ) 1 g tablet Take 1 tablet (1 g total) by mouth 3 (three) times daily before meals. Dissolve in 4 tbs of warm water , swish and swallow (Patient taking differently: Take 1 g by mouth 3 (three) times daily with meals as needed (Numb mouth). Dissolve in 4 tbs of warm water , swish and swallow) 90 tablet 1   tadalafil (CIALIS) 5 MG tablet Take 5 mg by mouth daily.     tamsulosin (FLOMAX) 0.4 MG CAPS capsule Take 0.4 mg by mouth daily.     traZODone  (DESYREL ) 50 MG tablet Take  0.5-2 tablets (25-100 mg total) by mouth at bedtime as needed for sleep. (Patient taking differently: Take 100 mg by mouth at bedtime.) 60 tablet 1   No current facility-administered medications for this visit.    VITAL SIGNS: There were no vitals taken for this visit. There were no vitals filed for this visit.   Estimated body mass index is 22.11 kg/m as calculated from the following:   Height as of 09/30/23: 5\' 9"  (1.753 m).   Weight as of 09/30/23: 149 lb 11.1 oz (67.9 kg).  LABS: CBC:    Component Value Date/Time  WBC 3.7 (L) 10/05/2023 1403   WBC 2.0 (L) 09/30/2023 0632   HGB 7.9 (L) 10/05/2023 1403   HCT 23.0 (L) 10/05/2023 1403   HCT 24.4 (L) 09/14/2023 0836   PLT 99 (L) 10/05/2023 1403   MCV 90.9 10/05/2023 1403   NEUTROABS 2.8 10/05/2023 1403   LYMPHSABS 0.3 (L) 10/05/2023 1403   MONOABS 0.3 10/05/2023 1403   EOSABS 0.1 10/05/2023 1403   BASOSABS 0.0 10/05/2023 1403   Comprehensive Metabolic Panel:    Component Value Date/Time   NA 133 (L) 09/29/2023 0919   NA 133 (L) 09/29/2023 0919   K 4.0 09/29/2023 0919   K 4.0 09/29/2023 0919   CL 99 09/29/2023 0919   CL 99 09/29/2023 0919   CO2 26 09/29/2023 0919   CO2 26 09/29/2023 0919   BUN 25 (H) 09/29/2023 0919   BUN 25 (H) 09/29/2023 0919   CREATININE 1.80 (H) 09/29/2023 0919   CREATININE 1.79 (H) 09/29/2023 0919   GLUCOSE 116 (H) 09/29/2023 0919   GLUCOSE 112 (H) 09/29/2023 0919   CALCIUM  8.7 (L) 09/29/2023 0919   CALCIUM  8.6 (L) 09/29/2023 0919   AST 15 09/29/2023 0919   ALT <5 09/29/2023 0919   ALKPHOS 38 09/29/2023 0919   BILITOT 1.0 09/29/2023 0919   PROT 6.9 09/29/2023 0919   ALBUMIN 4.1 09/29/2023 0919    RADIOGRAPHIC STUDIES: No results found.   PERFORMANCE STATUS (ECOG) : 1 - Symptomatic but completely ambulatory  Review of Systems Unless otherwise noted, a complete review of systems is negative.  Physical Exam General: NAD HEENT: Right oral lesion Cardiovascular: regular rate and  rhythm Pulmonary: clear ant fields Abdomen: soft, nontender, + bowel sounds, PEG GU: no suprapubic tenderness Extremities: no edema, no joint deformities Skin: no rashes Neurological: Weakness but otherwise nonfocal  IMPRESSION/PLAN: Squamous cell carcinoma of the head neck -s/p concurrent cisplatin /XRT  Dysphagia/poor oral intake/weight loss-patient status post PEG.  On nutritional supplements as per RD.  Labs today show mild hypomagnesemia.  Will increase magnesium  supplement and follow-up with labs next week.  Neoplasm related pain -continue fentanyl /oxycodone   Anxiety/depression-patient has chronic anxiety.  Followed by psychiatry.  URI -check respiratory panel.  No indication for antibiotics currently.  Discussed conservative measures with OTC medications.   Patient expressed understanding and was in agreement with this plan. He also understands that He can call clinic at any time with any questions, concerns, or complaints.   Thank you for allowing me to participate in the care of this very pleasant patient.   Time Total: 20 minutes  Visit consisted of counseling and education dealing with the complex and emotionally intense issues of symptom management in the setting of serious illness.Greater than 50%  of this time was spent counseling and coordinating care related to the above assessment and plan.  Signed by: Gerilyn Kobus, PhD, NP-C

## 2023-10-06 ENCOUNTER — Telehealth: Payer: Self-pay | Admitting: *Deleted

## 2023-10-06 ENCOUNTER — Other Ambulatory Visit: Payer: Self-pay | Admitting: Psychiatry

## 2023-10-06 NOTE — Telephone Encounter (Signed)
 Can you look into this?

## 2023-10-06 NOTE — Telephone Encounter (Signed)
 Patient wife called to request a over bed table for patient. She states that he is spending most of his day in bed.

## 2023-10-06 NOTE — Telephone Encounter (Signed)
 Contacted patient and pt's wife regarding resp. Panel results.

## 2023-10-06 NOTE — Telephone Encounter (Signed)
 Wife called and stated patient was seen yesterday by Lilian Register NP in symptom management clinic.  States patient tested negative for Covid and is having a lot of drainage in back of throat and was wanting a medication called in to help patient with secretions.

## 2023-10-06 NOTE — Telephone Encounter (Signed)
 I called patient's wife  -  she will go to the pharmacy and see if she can get pt started on OTC meds to see if this helps

## 2023-10-07 ENCOUNTER — Encounter: Payer: Self-pay | Admitting: Psychiatry

## 2023-10-07 ENCOUNTER — Other Ambulatory Visit: Payer: Self-pay

## 2023-10-07 ENCOUNTER — Ambulatory Visit: Admitting: Psychiatry

## 2023-10-07 VITALS — BP 125/75 | HR 72 | Temp 97.0°F | Ht 69.0 in | Wt 148.0 lb

## 2023-10-07 DIAGNOSIS — F330 Major depressive disorder, recurrent, mild: Secondary | ICD-10-CM

## 2023-10-07 DIAGNOSIS — F411 Generalized anxiety disorder: Secondary | ICD-10-CM | POA: Diagnosis not present

## 2023-10-07 DIAGNOSIS — G4700 Insomnia, unspecified: Secondary | ICD-10-CM | POA: Diagnosis not present

## 2023-10-07 MED ORDER — BUSPIRONE HCL 7.5 MG PO TABS: 7.5000 mg | ORAL_TABLET | Freq: Two times a day (BID) | ORAL | 3 refills | Status: DC

## 2023-10-07 MED ORDER — TRAZODONE HCL 50 MG PO TABS: 25.0000 mg | ORAL_TABLET | Freq: Every evening | ORAL | 3 refills | Status: AC | PRN

## 2023-10-07 NOTE — Telephone Encounter (Signed)
 The order has been electronically signed via [employer].

## 2023-10-07 NOTE — Patient Instructions (Signed)
 Continue sertraline  150 mg at night Continue bupropion  450 mg daily Continue Buspar  7.5 mg twice a day  Continue trazodone  100 mg at night as needed for insomnia Continue clonazepam  1 mg three times a day Next appointment-  7/14 at 11:30

## 2023-10-08 ENCOUNTER — Encounter: Payer: Self-pay | Admitting: Oncology

## 2023-10-08 ENCOUNTER — Inpatient Hospital Stay

## 2023-10-08 ENCOUNTER — Telehealth: Payer: Self-pay

## 2023-10-08 NOTE — Progress Notes (Addendum)
 Nutrition Follow-up:  Patient with stage I SCC of oropharynx, p 16+.  Patient has completed chemotherapy and radiation.    Spoke with wife and patient via phone for nutrition follow-up.  Patient has not used enteral nutrition for the last 2 days.  Was able to eat eggs, burger, Frosty and spaghetti over the last few days.  Has not had diarrhea in the last 2-3 days.  Still holding senokot.  Denies nausea.  Reports abdominal pain when he twist a certain way and thinks it is coming from the tube being placed.  Does not hurt all the time.  Says that his respiratory symptoms are better.      Medications: reviewed  Labs: no new  Anthropometrics:   No new   Estimated Energy Needs  Kcals: 1750-2100 Protein: 84-105 g Fluid: 1750-2100 ml  NUTRITION DIAGNOSIS: Inadequate oral intake continues   MALNUTRITION DIAGNOSIS: severe malnutrition continues   INTERVENTION:  Can add back senna if starts to get constipation.   Recommend supplementing oral nutrition with tube feeding formula.   Discussed with patient and wife if not going to use feeding tube it needs to be flushed with at least 60ml of water  daily (1 syringe full) to keep patent.  Wife said that she will try and flush tube after we get off the phone.   Wife asking about pharmacy that provides pill packs.  Will touch base with nursing. RD will be out of the office for the next 3 days.  Discussed this with wife and patient.  Wife concerned that an RD would not be covering for in person visits.  Discussed with wife that a covering RD would be available via phone and wife has this covering RD's direct number.  Also discussed Triage Nurse option and [facility] if needed to be seen in person.      MONITORING, EVALUATION, GOAL: weight trends,intake tube feeding   NEXT VISIT: Thursday, June 5th, phone call Reminded pt and wife of in person appointment on Tuesday, June 3rd.   Latron Ribas B. Zollie Hipp, CSO, LDN Registered Dietitian [phone]

## 2023-10-08 NOTE — Telephone Encounter (Signed)
 Nutrition  Spoke with wife via phone (~12) as had spoke with nursing about possible pharmacies that would prepare medication in pill packs.    Wife wanted RD to email this list to her so she could call and make sure they take patient's insurance.  [facility], [facility] and [facility] emailed to [email or web address].    Asked wife if she had flushed patients tube and she said no that she had to run out and get the oiled change in the car.  She will try and flush the tube when she gets back home.   Westlyn Glaza B. Zollie Hipp, CSO, LDN Registered Dietitian [phone]

## 2023-10-12 ENCOUNTER — Other Ambulatory Visit: Payer: Self-pay

## 2023-10-12 ENCOUNTER — Telehealth: Payer: Self-pay | Admitting: Hospice and Palliative Medicine

## 2023-10-12 ENCOUNTER — Inpatient Hospital Stay: Attending: Oncology

## 2023-10-12 ENCOUNTER — Encounter: Payer: Self-pay | Admitting: Hospice and Palliative Medicine

## 2023-10-12 ENCOUNTER — Inpatient Hospital Stay

## 2023-10-12 ENCOUNTER — Inpatient Hospital Stay (HOSPITAL_BASED_OUTPATIENT_CLINIC_OR_DEPARTMENT_OTHER): Admitting: Hospice and Palliative Medicine

## 2023-10-12 VITALS — BP 121/69 | HR 63 | Temp 97.7°F | Resp 18 | Ht 69.0 in | Wt 146.0 lb

## 2023-10-12 DIAGNOSIS — G893 Neoplasm related pain (acute) (chronic): Secondary | ICD-10-CM | POA: Diagnosis not present

## 2023-10-12 DIAGNOSIS — C109 Malignant neoplasm of oropharynx, unspecified: Secondary | ICD-10-CM | POA: Diagnosis present

## 2023-10-12 DIAGNOSIS — F32A Depression, unspecified: Secondary | ICD-10-CM | POA: Insufficient documentation

## 2023-10-12 DIAGNOSIS — R5383 Other fatigue: Secondary | ICD-10-CM | POA: Diagnosis not present

## 2023-10-12 DIAGNOSIS — F419 Anxiety disorder, unspecified: Secondary | ICD-10-CM | POA: Insufficient documentation

## 2023-10-12 DIAGNOSIS — Z931 Gastrostomy status: Secondary | ICD-10-CM | POA: Insufficient documentation

## 2023-10-12 DIAGNOSIS — Z95828 Presence of other vascular implants and grafts: Secondary | ICD-10-CM

## 2023-10-12 DIAGNOSIS — Z452 Encounter for adjustment and management of vascular access device: Secondary | ICD-10-CM | POA: Insufficient documentation

## 2023-10-12 LAB — CMP (CANCER CENTER ONLY)
ALT: 6 U/L (ref 0–44)
AST: 16 U/L (ref 15–41)
Albumin: 4 g/dL (ref 3.5–5.0)
Alkaline Phosphatase: 43 U/L (ref 38–126)
Anion gap: 10 (ref 5–15)
BUN: 22 mg/dL (ref 8–23)
CO2: 24 mmol/L (ref 22–32)
Calcium: 8.9 mg/dL (ref 8.9–10.3)
Chloride: 99 mmol/L (ref 98–111)
Creatinine: 1.59 mg/dL — ABNORMAL HIGH (ref 0.61–1.24)
GFR, Estimated: 48 mL/min — ABNORMAL LOW (ref >60)
Glucose, Bld: 109 mg/dL — ABNORMAL HIGH (ref 70–99)
Potassium: 3.8 mmol/L (ref 3.5–5.1)
Sodium: 133 mmol/L — ABNORMAL LOW (ref 135–145)
Total Bilirubin: 1 mg/dL (ref 0.0–1.2)
Total Protein: 6.9 g/dL (ref 6.5–8.1)

## 2023-10-12 LAB — CBC WITH DIFFERENTIAL (CANCER CENTER ONLY)
Abs Immature Granulocytes: 0.02 10*3/uL (ref 0.00–0.07)
Basophils Absolute: 0 10*3/uL (ref 0.0–0.1)
Basophils Relative: 1 %
Eosinophils Absolute: 0.1 10*3/uL (ref 0.0–0.5)
Eosinophils Relative: 2 %
HCT: 23.6 % — ABNORMAL LOW (ref 39.0–52.0)
Hemoglobin: 8.1 g/dL — ABNORMAL LOW (ref 13.0–17.0)
Immature Granulocytes: 0 %
Lymphocytes Relative: 7 %
Lymphs Abs: 0.3 10*3/uL — ABNORMAL LOW (ref 0.7–4.0)
MCH: 31.5 pg (ref 26.0–34.0)
MCHC: 34.3 g/dL (ref 30.0–36.0)
MCV: 91.8 fL (ref 80.0–100.0)
Monocytes Absolute: 0.2 10*3/uL (ref 0.1–1.0)
Monocytes Relative: 5 %
Neutro Abs: 3.9 10*3/uL (ref 1.7–7.7)
Neutrophils Relative %: 85 %
Platelet Count: 97 10*3/uL — ABNORMAL LOW (ref 150–400)
RBC: 2.57 MIL/uL — ABNORMAL LOW (ref 4.22–5.81)
RDW: 14.8 % (ref 11.5–15.5)
WBC Count: 4.6 10*3/uL (ref 4.0–10.5)
nRBC: 0 % (ref 0.0–0.2)

## 2023-10-12 LAB — PHOSPHORUS: Phosphorus: 3.5 mg/dL (ref 2.5–4.6)

## 2023-10-12 LAB — SAMPLE TO BLOOD BANK

## 2023-10-12 LAB — MAGNESIUM: Magnesium: 1.7 mg/dL (ref 1.7–2.4)

## 2023-10-12 MED ORDER — HEPARIN SOD (PORK) LOCK FLUSH 100 UNIT/ML IV SOLN
500.0000 [IU] | Freq: Once | INTRAVENOUS | Status: AC
  Administered 2023-10-12: 500 [IU]
  Filled 2023-10-12: qty 5

## 2023-10-12 NOTE — Progress Notes (Signed)
 No IV fluids or electrolyte replacements needed today.

## 2023-10-12 NOTE — Telephone Encounter (Signed)
 Josh, Pt spouse and pt are here in clinic today (6/3). Pt has a fluid clinic appt. Spouse is requesting to speak with you, privately. I told her I would send a message to you and let you know. Thank you.

## 2023-10-12 NOTE — Progress Notes (Signed)
 Patient requested to be added to Josh's schedule today. Patient present to clinic with c/o weakness. Reports a fall last evening. He "barely missed the chest drawer but sustained a small skin tear to his left arm." Denies any trauma to his head. Per patient's wife, pt is refusing tube feeds and dressing changes frequently and as scheduled. Pt reports that he often has "anxiety tremors during/before the tube maintenance/feeds." Wife requested tube feeding wound/site to be evaluated. No signs of infection around wound. Tube feeding Site redressed per policy.

## 2023-10-12 NOTE — Progress Notes (Signed)
 Symptom Management Clinic [facility] at [facility] Telephone:(336) [phone] Fax:(336) [phone]  Patient Care Team: Melchor Spoon, MD as PCP - General (Internal Medicine) Avonne Boettcher, MD as Consulting Physician (Oncology)   NAME OF PATIENT: Ricky [name]  [id number]  1955-08-07   DATE OF VISIT: 10/12/23  REASON FOR CONSULT: Ricky [name] is a 68 y.o. 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   INTERVAL HISTORY: Patient saw Dr. Randy Buttery on 09/14/2023.  Patient is status post concurrent chemoradiation.  He completed cycle 7 cisplatin  on 09/14/2023.  Patient with persistent poor oral intake and weight loss.  Underwent PEG placement on 09/30/2023.    Patient presents [facility] today for follow-up labs and supportive care.  Patient says that he had weakness yesterday after he did not eat or utilize his PEG for fluids/nutrition.  Wife has noticed that his mobility and "weakness" seem to improve after patient takes clonazepam .  Patient endorses significant depression and anxiety.  He is actively followed by psychiatry.  Patient denies nausea, vomiting, diarrhea or constipation.  No urinary complaints.  Denies respiratory complaints or chest pain.  Patient offers no further specific complaints today.  PAST MEDICAL HISTORY: Past Medical History:  Diagnosis Date   Anemia    Anxiety    Arthritis    Carpal tunnel syndrome    Depression    Diabetes (HCC)    Hyperlipidemia    Hypertension    Loss of teeth due to extraction    Neoplasm related pain    Parkinson's disease (HCC)     PAST SURGICAL HISTORY:  Past Surgical History:  Procedure Laterality Date   CARPAL TUNNEL RELEASE Right 11/28/2020   Procedure: CARPAL TUNNEL RELEASE ENDOSCOPIC;  Surgeon: Elner Hahn, MD;  Location: [facility] ORS;  Service: Orthopedics;  Laterality: Right;   CARPAL TUNNEL RELEASE Left 01/22/2021   Procedure: CARPAL  TUNNEL RELEASE ENDOSCOPIC;  Surgeon: Elner Hahn, MD;  Location: [facility] ORS;  Service: Orthopedics;  Laterality: Left;   INSERTION, GASTROSTOMY TUBE, ROBOT-ASSISTED N/A 09/30/2023   Procedure: INSERTION, GASTROSTOMY TUBE, ROBOT-ASSISTED;  Surgeon: Alben Alma, MD;  Location: [facility] ORS;  Service: General;  Laterality: N/A;   IR IMAGING GUIDED PORT INSERTION  06/30/2023   KNEE ARTHROSCOPY Right    KNEE ARTHROSCOPY Left    MANDIBLE FRACTURE SURGERY     as teenager    HEMATOLOGY/ONCOLOGY HISTORY:  Oncology History  Squamous cell carcinoma of oropharynx (HCC)  06/22/2023 Initial Diagnosis   Squamous cell carcinoma of oropharynx (HCC)   06/22/2023 Cancer Staging   Staging form: Pharynx - HPV-Mediated Oropharynx, AJCC 8th Edition - Clinical stage from 06/22/2023: Stage I (cT2, cN1, cM0, p16+) - Signed by Avonne Boettcher, MD on 07/12/2023 Stage prefix: Initial diagnosis   07/12/2023 -  Chemotherapy   Patient is on Treatment Plan : HEAD/NECK Cisplatin  (40) q7d       ALLERGIES:  is allergic to mirtazapine and other.  MEDICATIONS:  Current Outpatient Medications  Medication Sig Dispense Refill   ACCU-CHEK GUIDE TEST test strip TEST 2X DAILY     Accu-Chek Softclix Lancets lancets 2 (two) times daily.     Blood Glucose Monitoring Suppl (ACCU-CHEK GUIDE ME) w/Device KIT See admin instructions.     buPROPion  (WELLBUTRIN  XL) 150 MG 24 hr tablet Take 1 tablet (150 mg total) by mouth daily. Total of 450 mg daily. Take along with 300 mg tab 30 tablet 5  buPROPion  (WELLBUTRIN  XL) 300 MG 24 hr tablet Take 1 tablet (300 mg total) by mouth daily. Total of 450 mg daily. Take along with 150 mg tab 30 tablet 5   [START ON 10/23/2023] busPIRone  (BUSPAR ) 7.5 MG tablet Take 1 tablet (7.5 mg total) by mouth 2 (two) times daily. 60 tablet 3   calcium  carbonate (CALCIUM  600) 600 MG TABS tablet Take 1 tablet (600 mg total) by mouth daily. For low calcium  60 tablet 0   carbidopa -levodopa  (SINEMET  IR) 25-100 MG tablet  Take 2-3 tablets by mouth See admin instructions. Take 3 tablets in morning 2 tablet at bedtime     clonazePAM  (KLONOPIN ) 1 MG tablet Take 1 tablet (1 mg total) by mouth 3 (three) times daily as needed for anxiety. 90 tablet 1   cyanocobalamin  (VITAMIN B12) 1000 MCG tablet Take 1,000 mcg by mouth daily.     folic acid  (FOLVITE ) 1 MG tablet Take 1 tablet (1 mg total) by mouth daily. 30 tablet 3   ketoconazole (NIZORAL) 2 % shampoo Apply 1 application  topically every other day.     lidocaine -prilocaine  (EMLA ) cream Apply to affected area once 30 g 3   magnesium  chloride (SLOW-MAG) 64 MG TBEC SR tablet Take 2 tablets (128 mg total) by mouth 2 (two) times daily. For low magnesium  120 tablet 00   metFORMIN  (GLUCOPHAGE ) 500 MG tablet Take 500 mg by mouth daily with breakfast.     Oxycodone  HCl 10 MG TABS Take 1 tablet (10 mg total) by mouth every 4 (four) hours as needed. 60 tablet 0   polyethylene glycol (MIRALAX / GLYCOLAX) 17 g packet Take 17 g by mouth daily.     rosuvastatin  (CRESTOR ) 20 MG tablet Take 20 mg by mouth daily.     sertraline  (ZOLOFT ) 100 MG tablet Take 1.5 tablets (150 mg total) by mouth at bedtime. 45 tablet 3   tadalafil (CIALIS) 5 MG tablet Take 5 mg by mouth daily.     tamsulosin (FLOMAX) 0.4 MG CAPS capsule Take 0.4 mg by mouth daily.     [START ON 10/13/2023] traZODone  (DESYREL ) 50 MG tablet Take 0.5-2 tablets (25-100 mg total) by mouth at bedtime as needed for sleep. 60 tablet 3   fentaNYL  (DURAGESIC ) 12 MCG/HR Place 1 patch onto the skin every 3 (three) days. (Patient not taking: Reported on 10/12/2023) 10 patch 0   Nutritional Supplements (NUTREN 1.5) LIQD Give 1 carton in feeding tube via bolus syringe 5 times a day.  Flush with 60cc of water  before and 120cc of water  after. (Patient not taking: Reported on 10/12/2023)     ondansetron  (ZOFRAN ) 8 MG tablet Take 1 tablet (8 mg total) by mouth every 8 (eight) hours as needed for nausea or vomiting. Start on the third day after  cisplatin . (Patient not taking: Reported on 10/12/2023) 30 tablet 1   prochlorperazine  (COMPAZINE ) 10 MG tablet TAKE 1 TABLET(10 MG) BY MOUTH EVERY 6 HOURS AS NEEDED FOR NAUSEA OR VOMITING (Patient not taking: Reported on 10/12/2023) 30 tablet 1   sucralfate  (CARAFATE ) 1 g tablet Take 1 tablet (1 g total) by mouth 3 (three) times daily before meals. Dissolve in 4 tbs of warm water , swish and swallow (Patient not taking: Reported on 10/12/2023) 90 tablet 1   No current facility-administered medications for this visit.    VITAL SIGNS: There were no vitals taken for this visit. There were no vitals filed for this visit.   Estimated body mass index is 21.86 kg/m as calculated  from the following:   Height as of 10/07/23: 5\' 9"  (1.753 m).   Weight as of 10/07/23: 148 lb (67.1 kg).  LABS: CBC:    Component Value Date/Time   WBC 3.7 (L) 10/05/2023 1403   WBC 2.0 (L) 09/30/2023 0632   HGB 7.9 (L) 10/05/2023 1403   HCT 23.0 (L) 10/05/2023 1403   HCT 24.4 (L) 09/14/2023 0836   PLT 99 (L) 10/05/2023 1403   MCV 90.9 10/05/2023 1403   NEUTROABS 2.8 10/05/2023 1403   LYMPHSABS 0.3 (L) 10/05/2023 1403   MONOABS 0.3 10/05/2023 1403   EOSABS 0.1 10/05/2023 1403   BASOSABS 0.0 10/05/2023 1403   Comprehensive Metabolic Panel:    Component Value Date/Time   NA 134 (L) 10/05/2023 1403   K 4.4 10/05/2023 1403   CL 100 10/05/2023 1403   CO2 26 10/05/2023 1403   BUN 17 10/05/2023 1403   CREATININE 1.45 (H) 10/05/2023 1403   GLUCOSE 99 10/05/2023 1403   CALCIUM  8.6 (L) 10/05/2023 1403   AST 17 10/05/2023 1403   ALT 9 10/05/2023 1403   ALKPHOS 44 10/05/2023 1403   BILITOT 0.9 10/05/2023 1403   PROT 6.7 10/05/2023 1403   ALBUMIN 4.0 10/05/2023 1403    RADIOGRAPHIC STUDIES: No results found.   PERFORMANCE STATUS (ECOG) : 1 - Symptomatic but completely ambulatory  Review of Systems Unless otherwise noted, a complete review of systems is negative.  Physical Exam General: NAD HEENT: Right oral  lesion Cardiovascular: regular rate and rhythm Pulmonary: clear ant fields Abdomen: soft, nontender, + bowel sounds, PEG GU: no suprapubic tenderness Extremities: no edema, no joint deformities Skin: no rashes Neurological: Weakness but otherwise nonfocal  IMPRESSION/PLAN: Squamous cell carcinoma of the head neck -s/p concurrent cisplatin /XRT  Dysphagia/poor oral intake/weight loss-patient status post PEG.  No significant electrolyte derangements noted on labs.  Patient does not appear to be clinically dehydrated.  We had a long discussion today regarding the importance of nutrition in achieving his wellbeing and healing.  Patient appears to have significant depression and anxiety that are limiting his care.  He endorses anxiety when his significant other attempts to utilize the PEG and therefore he often refuses care.  Neoplasm related pain -continue fentanyl /oxycodone   Anxiety/depression-patient has chronic anxiety.  Followed by psychiatry.  Dr. Edda Goo saw patient last week.  Recommended follow-up with psychiatry.  Weakness -continue home health PT  Follow-up labs and supportive care next week  Patient expressed understanding and was in agreement with this plan. He also understands that He can call clinic at any time with any questions, concerns, or complaints.   Thank you for allowing me to participate in the care of this very pleasant patient.   Time Total: 20 minutes  Visit consisted of counseling and education dealing with the complex and emotionally intense issues of symptom management in the setting of serious illness.Greater than 50%  of this time was spent counseling and coordinating care related to the above assessment and plan.  Signed by: Gerilyn Kobus, PhD, NP-C

## 2023-10-13 ENCOUNTER — Telehealth: Payer: Self-pay | Admitting: *Deleted

## 2023-10-13 ENCOUNTER — Inpatient Hospital Stay: Admitting: Licensed Clinical Social Worker

## 2023-10-13 NOTE — Progress Notes (Signed)
 [facility] CSW Progress Note  Clinical Child psychotherapist contacted caregiver by phone to offer active listening and supportive counseling per the request of Josh Borders.  [name] stated that patient fell again last night but was able to return to bed with her assistance.  She reports [facility] is scheduled to assess patient next week.  [name] and Gwynn Lesches continue to adjust to his feeding tube.  She is his primary caregiver.  Discussed self care.  Plan is for CSW to meet with her and patient on 6/10 during his infusion.    Kennth Peal, LCSW Clinical Social Worker [facility]

## 2023-10-13 NOTE — Telephone Encounter (Signed)
 Contacted Tracy at [facility] PT. Left vm. I explained that we evaluated the patient yesterday in the clinic and the clinical team is aware of the pt's frequent falls and falls risk. I also noted that home health physical therapy was ordered to assist in improving the patient's lower extremity weakness and gait. The patient has been encouraged to actively participate in his care, including proper nutrition and mobility. However, his anxiety and mental health challenges have led to fatigue and decreased energy levels, making it difficult for him to find the motivation to get out of bed. He often feels overwhelmed and unable to engage in even basic activities, which has further impacted his mobility and strength. Patient has been advised to follow-up with psychiatry regarding medication management. The patient's wife inquired about rehab placement at the appointment yesterday. At this time, patient is able to access [facility] PT services and [facility] is not needed at this time.

## 2023-10-13 NOTE — Telephone Encounter (Signed)
 Ricky [name] had called to let us  know that in within 1 week the patient had falls.  1 was last night and early in the week was the second one.  Caregiver feels like he needs to go back in rehab but Ricky [name] is not sure if he could do that or not. The patient never went into hospital with 3 days stay. I will speak to [facility] about if we can help him .

## 2023-10-14 ENCOUNTER — Inpatient Hospital Stay

## 2023-10-14 ENCOUNTER — Other Ambulatory Visit: Payer: Self-pay

## 2023-10-14 ENCOUNTER — Encounter: Payer: Self-pay | Admitting: Licensed Clinical Social Worker

## 2023-10-14 ENCOUNTER — Encounter: Payer: Self-pay | Admitting: Oncology

## 2023-10-14 MED ORDER — CITALOPRAM HYDROBROMIDE 10 MG PO TABS
ORAL_TABLET | ORAL | 0 refills | Status: DC
  Filled 2023-10-14: qty 30, 30d supply, fill #0

## 2023-10-14 NOTE — Progress Notes (Signed)
 Nutrition Follow-up:  Patient with stage I SCC of oropharynx, p 16+.  Patient has completed chemotherapy and radiation.    Spoke with wife via phone for nutrition follow-up.  Patient resting and wanted wife to talk with RD.  Wife reports that patient has fallen several times during the night.  Reports that he has not used the feeding tube every day but when he has not eaten much during the day.  Says that he feels full using tube if has eaten well during the day.  Yesterday wife reports that patient was able to eat chicken leg, potato salad, green beans, drank 2 [employer] shakes and eggs and grits.  Wants to do a feeding at lunch today per wife .  Home PT is coming on Monday 6/9 to evaluate.  Wife is feeling exhausted.      Medications: reviewed  Labs: Na 133, creatinine 1.59, phosphorus 3.5, mag 1.7  Anthropometrics:   Weight 146 lb on 6/3 145 lb on 5/27 UBW of 177 lb on 06/24/23   Estimated Energy Needs  Kcals: 1750-2100 Protein: 84-105 g Fluid: 1750-2100 ml  NUTRITION DIAGNOSIS: Inadequate oral intake improving   MALNUTRITION DIAGNOSIS: Severe malnutrition continues   INTERVENTION:  Encouraged tube feeding in between meal times for added nutrition Reviewed importance of adequate nutrition and movement to improve strength and improve muscle mass     MONITORING, EVALUATION, GOAL: weight trends, intake, tube feeding   NEXT VISIT: Tuesday, June 10 while in clinic  Berry Gallacher B. Zollie Hipp, CSO, LDN Registered Dietitian [phone]

## 2023-10-18 ENCOUNTER — Telehealth: Payer: Self-pay | Admitting: Oncology

## 2023-10-18 NOTE — Telephone Encounter (Signed)
 Patients wife called to cancel appointments for tomorrow due to him being hospitalized at [facility]. She said to let you know they are doing some test to see if they can determine why he is falling so much.  Appointments cancelled as requested

## 2023-10-19 ENCOUNTER — Inpatient Hospital Stay

## 2023-10-20 ENCOUNTER — Ambulatory Visit: Admitting: Radiation Oncology

## 2023-10-20 ENCOUNTER — Encounter: Admitting: Surgery

## 2023-10-21 ENCOUNTER — Ambulatory Visit: Admitting: Licensed Clinical Social Worker

## 2023-10-21 ENCOUNTER — Other Ambulatory Visit: Payer: Self-pay | Admitting: Psychiatry

## 2023-10-25 ENCOUNTER — Telehealth: Payer: Self-pay | Admitting: *Deleted

## 2023-10-25 NOTE — Telephone Encounter (Signed)
 RN Contacted patient's wife. Pt d/c from [facility]. Wife stated patient had adjustments to routine medications.  Offered [facility] appointment this week to f/u w/patient s/p HP discharge. Wife declined apt today as HH PT will be servicing pt today. Requested apt with Josh- port labs/smc and possible iv fluids tomorrow. Apt time given of 1245 for port labs and smc/-w/Josh at 1pm and possible IV fluids s/p smc apt  Msg sent to scheduling to add pt to sch. Tomorrow.

## 2023-10-26 ENCOUNTER — Inpatient Hospital Stay

## 2023-10-26 ENCOUNTER — Inpatient Hospital Stay (HOSPITAL_BASED_OUTPATIENT_CLINIC_OR_DEPARTMENT_OTHER): Admitting: Hospice and Palliative Medicine

## 2023-10-26 ENCOUNTER — Inpatient Hospital Stay (HOSPITAL_BASED_OUTPATIENT_CLINIC_OR_DEPARTMENT_OTHER)

## 2023-10-26 VITALS — BP 132/73 | HR 83 | Temp 97.7°F | Resp 18

## 2023-10-26 DIAGNOSIS — Z95828 Presence of other vascular implants and grafts: Secondary | ICD-10-CM

## 2023-10-26 DIAGNOSIS — C109 Malignant neoplasm of oropharynx, unspecified: Secondary | ICD-10-CM

## 2023-10-26 LAB — CMP (CANCER CENTER ONLY)
ALT: 10 U/L (ref 0–44)
AST: 24 U/L (ref 15–41)
Albumin: 4.1 g/dL (ref 3.5–5.0)
Alkaline Phosphatase: 39 U/L (ref 38–126)
Anion gap: 8 (ref 5–15)
BUN: 37 mg/dL — ABNORMAL HIGH (ref 8–23)
CO2: 23 mmol/L (ref 22–32)
Calcium: 9.1 mg/dL (ref 8.9–10.3)
Chloride: 103 mmol/L (ref 98–111)
Creatinine: 1.45 mg/dL — ABNORMAL HIGH (ref 0.61–1.24)
GFR, Estimated: 53 mL/min — ABNORMAL LOW (ref >60)
Glucose, Bld: 144 mg/dL — ABNORMAL HIGH (ref 70–99)
Potassium: 4.6 mmol/L (ref 3.5–5.1)
Sodium: 134 mmol/L — ABNORMAL LOW (ref 135–145)
Total Bilirubin: 0.9 mg/dL (ref 0.0–1.2)
Total Protein: 6.8 g/dL (ref 6.5–8.1)

## 2023-10-26 LAB — CBC WITH DIFFERENTIAL (CANCER CENTER ONLY)
Abs Immature Granulocytes: 0.01 10*3/uL (ref 0.00–0.07)
Basophils Absolute: 0 10*3/uL (ref 0.0–0.1)
Basophils Relative: 1 %
Eosinophils Absolute: 0.1 10*3/uL (ref 0.0–0.5)
Eosinophils Relative: 2 %
HCT: 25.1 % — ABNORMAL LOW (ref 39.0–52.0)
Hemoglobin: 8.5 g/dL — ABNORMAL LOW (ref 13.0–17.0)
Immature Granulocytes: 0 %
Lymphocytes Relative: 8 %
Lymphs Abs: 0.3 10*3/uL — ABNORMAL LOW (ref 0.7–4.0)
MCH: 32.1 pg (ref 26.0–34.0)
MCHC: 33.9 g/dL (ref 30.0–36.0)
MCV: 94.7 fL (ref 80.0–100.0)
Monocytes Absolute: 0.3 10*3/uL (ref 0.1–1.0)
Monocytes Relative: 7 %
Neutro Abs: 3.2 10*3/uL (ref 1.7–7.7)
Neutrophils Relative %: 82 %
Platelet Count: 102 10*3/uL — ABNORMAL LOW (ref 150–400)
RBC: 2.65 MIL/uL — ABNORMAL LOW (ref 4.22–5.81)
RDW: 14.3 % (ref 11.5–15.5)
WBC Count: 3.8 10*3/uL — ABNORMAL LOW (ref 4.0–10.5)
nRBC: 0 % (ref 0.0–0.2)

## 2023-10-26 LAB — PHOSPHORUS: Phosphorus: 4 mg/dL (ref 2.5–4.6)

## 2023-10-26 LAB — SAMPLE TO BLOOD BANK

## 2023-10-26 LAB — MAGNESIUM: Magnesium: 2 mg/dL (ref 1.7–2.4)

## 2023-10-26 MED ORDER — ALTEPLASE 2 MG IJ SOLR
2.0000 mg | Freq: Once | INTRAMUSCULAR | Status: AC
  Administered 2023-10-26: 2 mg
  Filled 2023-10-26: qty 2

## 2023-10-26 MED ORDER — HEPARIN SOD (PORK) LOCK FLUSH 100 UNIT/ML IV SOLN
500.0000 [IU] | Freq: Once | INTRAVENOUS | Status: AC
  Administered 2023-10-26: 500 [IU]
  Filled 2023-10-26: qty 5

## 2023-10-26 NOTE — Progress Notes (Signed)
 Patient hasn't been feeling good ever sine he went into the hospital, since being out of the hospital he is eating a little better, but still feels very weak and fatigued.  Not feeling confused anymore.

## 2023-10-26 NOTE — Progress Notes (Signed)
 10/26/23- 1420-Alteplase dwell time completed per protocol. Blood return assessed and confirmed. No resistance noted during aspiration.  No complaints of pain or discomfort. No signs of bleeding or adverse reaction.  Successful Alteplase removal from implanted port. - Patient tolerated procedure well  Port flushed with heparin  lock. No iv fluids needed today.   Per v/o Josh, NP- RN Flushed PEG tube with 20 mL of sterile water  per protocol. Ensured patency and observed for any leakage or resistance. Patient has not flushed his peg tube today. No complications noted- flushes easily. Patient declines to flush at home besides his routine bedtime feeding schedule. Pt advised to continue to water  flushes multiple times a day to encourage hydration and maintain patency.  Patient d/c from clinic with new appointments.

## 2023-10-26 NOTE — Progress Notes (Signed)
 Symptom Management Clinic [facility] at [facility] [phone] [phone]  Patient Care Team: Melchor Spoon, MD as PCP - General (Internal Medicine) Avonne Boettcher, MD as Consulting Physician (Oncology)   NAME OF PATIENT: Ricky [name]  [id number]  [birth year]   DATE OF VISIT: 10/26/23  REASON FOR CONSULT: Destyn Schuyler is a 68 y.o. 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   INTERVAL HISTORY: Patient saw Dr. Randy Buttery on 09/14/2023.  Patient is status post concurrent chemoradiation.  He completed cycle 7 cisplatin  on 09/14/2023.  Patient with persistent poor oral intake and weight loss.  Underwent PEG placement on 09/30/2023.    Patient was hospitalized at [facility] 10/15/2023 to 10/24/2023 with altered mental status.  This was thought secondary to polypharmacy due to Sinemet  and Wellbutrin .  Patient was taken off Wellbutrin  and started on nightly Seroquel .  Patient presents to [facility] today for posthospital follow-up.  He says that he overall feels lousy but does not elucidate on specifics.  His significant other says that patient's appetite has improved as has his performance status.  Patient denies nausea, vomiting, diarrhea or constipation.  Denies pain.  No urinary complaints. Denies respiratory complaints or chest pain.  Patient offers no further specific complaints today.  PAST MEDICAL HISTORY: Past Medical History:  Diagnosis Date   Anemia    Anxiety    Arthritis    Carpal tunnel syndrome    Depression    Diabetes (HCC)    Hyperlipidemia    Hypertension    Loss of teeth due to extraction    Neoplasm related pain    Parkinson's disease (HCC)     PAST SURGICAL HISTORY:  Past Surgical History:  Procedure Laterality Date   CARPAL TUNNEL RELEASE Right 11/28/2020   Procedure: CARPAL TUNNEL RELEASE ENDOSCOPIC;  Surgeon: Elner Hahn, MD;  Location: [facility]  ORS;  Service: Orthopedics;  Laterality: Right;   CARPAL TUNNEL RELEASE Left 01/22/2021   Procedure: CARPAL TUNNEL RELEASE ENDOSCOPIC;  Surgeon: Elner Hahn, MD;  Location: [facility] ORS;  Service: Orthopedics;  Laterality: Left;   INSERTION, GASTROSTOMY TUBE, ROBOT-ASSISTED N/A 09/30/2023   Procedure: INSERTION, GASTROSTOMY TUBE, ROBOT-ASSISTED;  Surgeon: Alben Alma, MD;  Location: [facility] ORS;  Service: General;  Laterality: N/A;   IR IMAGING GUIDED PORT INSERTION  06/30/2023   KNEE ARTHROSCOPY Right    KNEE ARTHROSCOPY Left    MANDIBLE FRACTURE SURGERY     as teenager    HEMATOLOGY/ONCOLOGY HISTORY:  Oncology History  Squamous cell carcinoma of oropharynx (HCC)  06/22/2023 Initial Diagnosis   Squamous cell carcinoma of oropharynx (HCC)   06/22/2023 Cancer Staging   Staging form: Pharynx - HPV-Mediated Oropharynx, AJCC 8th Edition - Clinical stage from 06/22/2023: Stage I (cT2, cN1, cM0, p16+) - Signed by Avonne Boettcher, MD on 07/12/2023 Stage prefix: Initial diagnosis   07/12/2023 -  Chemotherapy   Patient is on Treatment Plan : HEAD/NECK Cisplatin  (40) q7d       ALLERGIES:  is allergic to mirtazapine and other.  MEDICATIONS:  Current Outpatient Medications  Medication Sig Dispense Refill   ACCU-CHEK GUIDE TEST test strip TEST 2X DAILY     Accu-Chek Softclix Lancets lancets 2 (two) times daily.     atenolol (TENORMIN) 50 MG tablet Take 50 mg by mouth daily.     Blood Glucose Monitoring Suppl (ACCU-CHEK GUIDE ME) w/Device KIT See admin instructions.  buPROPion  (WELLBUTRIN  XL) 150 MG 24 hr tablet Take 1 tablet (150 mg total) by mouth daily. Total of 450 mg daily. Take along with 300 mg tab 30 tablet 5   buPROPion  (WELLBUTRIN  XL) 300 MG 24 hr tablet Take 1 tablet (300 mg total) by mouth daily. Total of 450 mg daily. Take along with 150 mg tab 30 tablet 5   busPIRone  (BUSPAR ) 7.5 MG tablet Take 1 tablet (7.5 mg total) by mouth 2 (two) times daily. 60 tablet 3   calcium  carbonate  (CALCIUM  600) 600 MG TABS tablet Take 1 tablet (600 mg total) by mouth daily. For low calcium  60 tablet 0   calcium  carbonate (TUMS) 500 MG chewable tablet Chew 500 mg by mouth daily.     carbidopa -levodopa  (SINEMET  IR) 25-100 MG tablet Take 2-3 tablets by mouth See admin instructions. Take 3 tablets in morning 2 tablet at bedtime     clonazePAM  (KLONOPIN ) 1 MG tablet Take 1 tablet (1 mg total) by mouth 3 (three) times daily as needed for anxiety. 90 tablet 1   cyanocobalamin  (VITAMIN B12) 1000 MCG tablet Take 1,000 mcg by mouth daily.     fentaNYL  (DURAGESIC ) 12 MCG/HR Place 1 patch onto the skin every 3 (three) days. (Patient not taking: Reported on 10/12/2023) 10 patch 0   folic acid  (FOLVITE ) 1 MG tablet Take 1 tablet (1 mg total) by mouth daily. 30 tablet 3   ketoconazole (NIZORAL) 2 % shampoo Apply 1 application  topically every other day.     lidocaine -prilocaine  (EMLA ) cream Apply to affected area once 30 g 3   magnesium  chloride (SLOW-MAG) 64 MG TBEC SR tablet Take 2 tablets (128 mg total) by mouth 2 (two) times daily. For low magnesium  120 tablet 00   metFORMIN  (GLUCOPHAGE ) 500 MG tablet Take 500 mg by mouth daily with breakfast.     Multiple Vitamin (MULTI-VITAMIN) tablet Take 1 tablet by mouth daily.     Nutritional Supplements (NUTREN 1.5) LIQD Give 1 carton in feeding tube via bolus syringe 5 times a day.  Flush with 60cc of water  before and 120cc of water  after. (Patient not taking: Reported on 10/12/2023)     ondansetron  (ZOFRAN ) 8 MG tablet Take 1 tablet (8 mg total) by mouth every 8 (eight) hours as needed for nausea or vomiting. Start on the third day after cisplatin . (Patient not taking: Reported on 10/12/2023) 30 tablet 1   Oxycodone  HCl 10 MG TABS Take 1 tablet (10 mg total) by mouth every 4 (four) hours as needed. 60 tablet 0   polyethylene glycol (MIRALAX / GLYCOLAX) 17 g packet Take 17 g by mouth daily.     prochlorperazine  (COMPAZINE ) 10 MG tablet TAKE 1 TABLET(10 MG) BY MOUTH  EVERY 6 HOURS AS NEEDED FOR NAUSEA OR VOMITING (Patient not taking: Reported on 10/12/2023) 30 tablet 1   QUEtiapine  (SEROQUEL ) 50 MG tablet Take 50 mg by mouth at bedtime.     rosuvastatin  (CRESTOR ) 20 MG tablet Take 20 mg by mouth daily.     senna (SENOKOT) 8.6 MG tablet Take 1 tablet by mouth 2 (two) times daily.     sertraline  (ZOLOFT ) 100 MG tablet Take 1.5 tablets (150 mg total) by mouth at bedtime. 45 tablet 3   sucralfate  (CARAFATE ) 1 g tablet Take 1 tablet (1 g total) by mouth 3 (three) times daily before meals. Dissolve in 4 tbs of warm water , swish and swallow (Patient not taking: Reported on 10/12/2023) 90 tablet 1   tadalafil (CIALIS)  5 MG tablet Take 5 mg by mouth daily.     tamsulosin (FLOMAX) 0.4 MG CAPS capsule Take 0.4 mg by mouth daily.     traZODone  (DESYREL ) 50 MG tablet Take 0.5-2 tablets (25-100 mg total) by mouth at bedtime as needed for sleep. 60 tablet 3   No current facility-administered medications for this visit.    VITAL SIGNS: BP 132/73 (BP Location: Right Arm, Patient Position: Sitting, Cuff Size: Normal)   Pulse 83   Temp 97.7 F (36.5 C) (Tympanic)   Resp 18   SpO2 100%  There were no vitals filed for this visit.   Estimated body mass index is 21.56 kg/m as calculated from the following:   Height as of 10/12/23: 5' 9 (1.753 m).   Weight as of 10/12/23: 146 lb (66.2 kg).  LABS: CBC:    Component Value Date/Time   WBC 3.8 (L) 10/26/2023 1252   WBC 2.0 (L) 09/30/2023 0632   HGB 8.5 (L) 10/26/2023 1252   HCT 25.1 (L) 10/26/2023 1252   HCT 24.4 (L) 09/14/2023 0836   PLT 102 (L) 10/26/2023 1252   MCV 94.7 10/26/2023 1252   NEUTROABS 3.2 10/26/2023 1252   LYMPHSABS 0.3 (L) 10/26/2023 1252   MONOABS 0.3 10/26/2023 1252   EOSABS 0.1 10/26/2023 1252   BASOSABS 0.0 10/26/2023 1252   Comprehensive Metabolic Panel:    Component Value Date/Time   NA 133 (L) 10/12/2023 0915   K 3.8 10/12/2023 0915   CL 99 10/12/2023 0915   CO2 24 10/12/2023 0915   BUN 22  10/12/2023 0915   CREATININE 1.59 (H) 10/12/2023 0915   GLUCOSE 109 (H) 10/12/2023 0915   CALCIUM  8.9 10/12/2023 0915   AST 16 10/12/2023 0915   ALT 6 10/12/2023 0915   ALKPHOS 43 10/12/2023 0915   BILITOT 1.0 10/12/2023 0915   PROT 6.9 10/12/2023 0915   ALBUMIN 4.0 10/12/2023 0915    RADIOGRAPHIC STUDIES: No results found.   PERFORMANCE STATUS (ECOG) : 1 - Symptomatic but completely ambulatory  Review of Systems Unless otherwise noted, a complete review of systems is negative.  Physical Exam General: NAD Cardiovascular: regular rate and rhythm Pulmonary: clear ant fields Abdomen: soft, nontender, + bowel sounds, PEG GU: no suprapubic tenderness Extremities: no edema, no joint deformities Skin: no rashes Neurological: Weakness but otherwise nonfocal  IMPRESSION/PLAN: Squamous cell carcinoma of the head neck -s/p concurrent cisplatin /XRT.  Pending PET scan on 11/23/2023  Dysphagia/poor oral intake/weight loss-patient status post PEG.  No significant electrolyte derangements noted on labs.  Patient does not appear to be clinically dehydrated.  Serum creatinine has chronic mild elevation but is within baseline today at 1.45.  Weakness -patient is followed by home health PT  Anxiety/Depression -with recent medication adjustment while patient was hospitalized.  Message sent to Dr. Edda Goo regarding outpatient follow-up.  Neoplasm related pain -continue fentanyl /oxycodone   Follow-up labs and supportive care 2 weeks. Case and plan discussed with Dr. Randy Buttery.   Patient expressed understanding and was in agreement with this plan. He also understands that He can call clinic at any time with any questions, concerns, or complaints.   Thank you for allowing me to participate in the care of this very pleasant patient.   Time Total: 20 minutes  Visit consisted of counseling and education dealing with the complex and emotionally intense issues of symptom management in the setting of  serious illness.Greater than 50%  of this time was spent counseling and coordinating care related to the above assessment  and plan.  Signed by: Gerilyn Kobus, PhD, NP-C

## 2023-10-29 NOTE — Progress Notes (Unsigned)
 BH MD/PA/NP OP Progress Note  10/29/2023 3:05 PM Ricky [name]  MRN:  [id number]  Chief Complaint: No chief complaint on file.  HPI: *** According to the chart review, the following events have occurred since the last visit: The patient presented to ED for evaluation of recurrent falls, altered mental status and poor p.o. intake, and was admitted 6/6-15/2025. Quetiapine  50 mg at night, 25 mg TIDwas added. bupropion  was reduced to 300 mg daily, buspar  was discontinued.   Recurrent falls, subacute multi-factorial encephalopathy family reported recent episodes of what they thought was visual hallucinations of people. Patient had broad medical and infectious work up that was generally unrevealing and MRI of brain without acute findings. Highest on differential was polypharmacy as patient on multiple centrally acting medications that have interactions. Most concerning drug interaction was Wellbutrin  and sinemet  and required dose reduction of both of these medications. He also recently completed platinum based chemotherapy and may have experienced some neurotoxicity from this.He was also initiated on nighttime seroquel  as well as PRN seroquel  for agitation with improvement in symptoms.   Visit Diagnosis: No diagnosis found.  Past Psychiatric History: Please see initial evaluation for full details. I have reviewed the history. No updates at this time.     Past Medical History:  Past Medical History:  Diagnosis Date   Anemia    Anxiety    Arthritis    Carpal tunnel syndrome    Depression    Diabetes (HCC)    Hyperlipidemia    Hypertension    Loss of teeth due to extraction    Neoplasm related pain    Parkinson's disease [facility])     Past Surgical History:  Procedure Laterality Date   CARPAL TUNNEL RELEASE Right 11/28/2020   Procedure: CARPAL TUNNEL RELEASE ENDOSCOPIC;  Surgeon: Elner Hahn, MD;  Location: [facility] ORS;  Service: Orthopedics;  Laterality: Right;   CARPAL TUNNEL RELEASE Left  01/22/2021   Procedure: CARPAL TUNNEL RELEASE ENDOSCOPIC;  Surgeon: Elner Hahn, MD;  Location: [facility] ORS;  Service: Orthopedics;  Laterality: Left;   INSERTION, GASTROSTOMY TUBE, ROBOT-ASSISTED N/A 09/30/2023   Procedure: INSERTION, GASTROSTOMY TUBE, ROBOT-ASSISTED;  Surgeon: Alben Alma, MD;  Location: [facility] ORS;  Service: General;  Laterality: N/A;   IR IMAGING GUIDED PORT INSERTION  06/30/2023   KNEE ARTHROSCOPY Right    KNEE ARTHROSCOPY Left    MANDIBLE FRACTURE SURGERY     as teenager    Family Psychiatric History: Please see initial evaluation for full details. I have reviewed the history. No updates at this time.     Family History:  Family History  Problem Relation Age of Onset   Diabetes Mother    Hypertension Mother    CVA Father    Heart attack Father    Depression Sister    Depression Brother     Social History:  Social History   Socioeconomic History   Marital status: Significant Other    Spouse name: [name]   Number of children: Not on file   Years of education: Not on file   Highest education level: Not on file  Occupational History   Not on file  Tobacco Use   Smoking status: Never    Passive exposure: Never   Smokeless tobacco: Never  Vaping Use   Vaping status: Never Used  Substance and Sexual Activity   Alcohol use: Not Currently   Drug use: Never   Sexual activity: Not Currently    Birth control/protection: None  Other Topics  Concern   Not on file  Social History Narrative   Lives with friend   Social Drivers of Health   Financial Resource Strain: Low Risk  (10/28/2023)   [facility]   Overall Financial Resource Strain (CARDIA)    Difficulty of Paying Living Expenses: Not hard at all  Food Insecurity: No Food Insecurity (10/28/2023)   Received from [facility]   Hunger Vital Sign    Within the past 12 months, you worried that your food would run out before you got the money to buy more.:  Never true    Within the past 12 months, the food you bought just didn't last and you didn't have money to get more.: Never true  Transportation Needs: No Transportation Needs (10/28/2023)   Received from [facility] - Transportation    In the past 12 months, has lack of transportation kept you from medical appointments or from getting medications?: No    Lack of Transportation (Non-Medical): No  Physical Activity: Inactive (09/29/2023)   Exercise Vital Sign    Days of Exercise per Week: 0 days    Minutes of Exercise per Session: 0 min  Stress: No Stress Concern Present (09/29/2023)   [employer] of Occupational Health - Occupational Stress Questionnaire    Feeling of Stress : Not at all  Social Connections: Moderately Isolated (09/29/2023)   Social Connection and Isolation Panel    Frequency of Communication with Friends and Family: Three times a week    Frequency of Social Gatherings with Friends and Family: Twice a week    Attends Religious Services: Never    Database administrator or Organizations: No    Attends Banker Meetings: Never    Marital Status: Married    Allergies:  Allergies  Allergen Reactions   Mirtazapine Rash   Other Itching and Rash    ETCO2 tubing. Medtronic Microstream advance    Metabolic Disorder Labs: Lab Results  Component Value Date   HGBA1C 5.2 08/25/2021   MPG 102.54 08/25/2021   No results found for: PROLACTIN No results found for: CHOL, TRIG, HDL, CHOLHDL, VLDL, LDLCALC Lab Results  Component Value Date   TSH 2.016 09/14/2023   TSH 2.715 08/13/2022    Therapeutic Level Labs: No results found for: LITHIUM No results found for: VALPROATE No results found for: CBMZ  Current Medications: Current Outpatient Medications  Medication Sig Dispense Refill   ACCU-CHEK GUIDE TEST test strip TEST 2X DAILY     Accu-Chek Softclix Lancets lancets 2 (two) times daily.     atenolol  (TENORMIN) 50 MG tablet Take 50 mg by mouth daily.     Blood Glucose Monitoring Suppl (ACCU-CHEK GUIDE ME) w/Device KIT See admin instructions.     buPROPion  (WELLBUTRIN  XL) 150 MG 24 hr tablet Take 1 tablet (150 mg total) by mouth daily. Total of 450 mg daily. Take along with 300 mg tab 30 tablet 5   buPROPion  (WELLBUTRIN  XL) 300 MG 24 hr tablet Take 1 tablet (300 mg total) by mouth daily. Total of 450 mg daily. Take along with 150 mg tab 30 tablet 5   busPIRone  (BUSPAR ) 7.5 MG tablet Take 1 tablet (7.5 mg total) by mouth 2 (two) times daily. 60 tablet 3   calcium  carbonate (CALCIUM  600) 600 MG TABS tablet Take 1 tablet (600 mg total) by mouth daily. For low calcium  (Patient not taking: Reported on 10/26/2023) 60 tablet 0  calcium  carbonate (TUMS) 500 MG chewable tablet Chew 500 mg by mouth daily.     carbidopa -levodopa  (SINEMET  IR) 25-100 MG tablet Take 2-3 tablets by mouth See admin instructions. Take 3 tablets in morning 2 tablet at bedtime     clonazePAM  (KLONOPIN ) 1 MG tablet Take 1 tablet (1 mg total) by mouth 3 (three) times daily as needed for anxiety. 90 tablet 1   cyanocobalamin  (VITAMIN B12) 1000 MCG tablet Take 1,000 mcg by mouth daily.     fentaNYL  (DURAGESIC ) 12 MCG/HR Place 1 patch onto the skin every 3 (three) days. 10 patch 0   folic acid  (FOLVITE ) 1 MG tablet Take 1 tablet (1 mg total) by mouth daily. 30 tablet 3   ketoconazole (NIZORAL) 2 % shampoo Apply 1 application  topically every other day.     lidocaine -prilocaine  (EMLA ) cream Apply to affected area once 30 g 3   magnesium  chloride (SLOW-MAG) 64 MG TBEC SR tablet Take 2 tablets (128 mg total) by mouth 2 (two) times daily. For low magnesium  (Patient not taking: Reported on 10/26/2023) 120 tablet 00   metFORMIN  (GLUCOPHAGE ) 500 MG tablet Take 500 mg by mouth daily with breakfast.     Multiple Vitamin (MULTI-VITAMIN) tablet Take 1 tablet by mouth daily.     Nutritional Supplements (NUTREN 1.5) LIQD Give 1 carton in feeding tube  via bolus syringe 5 times a day.  Flush with 60cc of water  before and 120cc of water  after. (Patient not taking: Reported on 10/12/2023)     ondansetron  (ZOFRAN ) 8 MG tablet Take 1 tablet (8 mg total) by mouth every 8 (eight) hours as needed for nausea or vomiting. Start on the third day after cisplatin . (Patient not taking: Reported on 10/26/2023) 30 tablet 1   Oxycodone  HCl 10 MG TABS Take 1 tablet (10 mg total) by mouth every 4 (four) hours as needed. 60 tablet 0   polyethylene glycol (MIRALAX / GLYCOLAX) 17 g packet Take 17 g by mouth daily. (Patient not taking: Reported on 10/26/2023)     prochlorperazine  (COMPAZINE ) 10 MG tablet TAKE 1 TABLET(10 MG) BY MOUTH EVERY 6 HOURS AS NEEDED FOR NAUSEA OR VOMITING (Patient not taking: Reported on 10/26/2023) 30 tablet 1   QUEtiapine  (SEROQUEL ) 50 MG tablet Take 50 mg by mouth at bedtime.     rosuvastatin  (CRESTOR ) 20 MG tablet Take 20 mg by mouth daily.     senna (SENOKOT) 8.6 MG tablet Take 1 tablet by mouth 2 (two) times daily.     sertraline  (ZOLOFT ) 100 MG tablet Take 1.5 tablets (150 mg total) by mouth at bedtime. 45 tablet 3   sucralfate  (CARAFATE ) 1 g tablet Take 1 tablet (1 g total) by mouth 3 (three) times daily before meals. Dissolve in 4 tbs of warm water , swish and swallow (Patient not taking: Reported on 10/26/2023) 90 tablet 1   tadalafil (CIALIS) 5 MG tablet Take 5 mg by mouth daily. (Patient not taking: Reported on 10/26/2023)     tamsulosin (FLOMAX) 0.4 MG CAPS capsule Take 0.4 mg by mouth daily.     traZODone  (DESYREL ) 50 MG tablet Take 0.5-2 tablets (25-100 mg total) by mouth at bedtime as needed for sleep. 60 tablet 3   No current facility-administered medications for this visit.     Musculoskeletal: Strength & Muscle Tone: N/A Gait & Station: N/A Patient leans: N/A  Psychiatric Specialty Exam: Review of Systems  There were no vitals taken for this visit.There is no height or weight on file to calculate  BMI.  General Appearance:  {Appearance:22683}  Eye Contact:  {BHH EYE CONTACT:22684}  Speech:  Clear and Coherent  Volume:  Normal  Mood:  {BHH MOOD:22306}  Affect:  {Affect (PAA):22687}  Thought Process:  Coherent  Orientation:  Full (Time, Place, and Person)  Thought Content: Logical   Suicidal Thoughts:  {ST/HT (PAA):22692}  Homicidal Thoughts:  {ST/HT (PAA):22692}  Memory:  Immediate;   Good  Judgement:  {Judgement (PAA):22694}  Insight:  {Insight (PAA):22695}  Psychomotor Activity:  Normal  Concentration:  Concentration: Good and Attention Span: Good  Recall:  Good  Fund of Knowledge: Good  Language: Good  Akathisia:  No  Handed:  Right  AIMS (if indicated): not done  Assets:  Communication Skills Desire for Improvement  ADL's:  Intact  Cognition: WNL  Sleep:  {BHH GOOD/FAIR/POOR:22877}   Screenings: ECT-MADRS    Flowsheet Row ECT Treatment from 07/28/2021 in [facility]  MADRS Total Score 41   GAD-7    Flowsheet Row Counselor from 09/08/2023 in [facility] Office Visit from 02/25/2023 in [facility] Office Visit from 01/07/2023 in [facility] Office Visit from 10/12/2022 in [facility] Office Visit from 06/16/2022 in [facility]  Total GAD-7 Score 20 18 18 12 20    Mini-Mental    Flowsheet Row ECT Treatment from 07/28/2021 in [facility]  Total Score (max 30 points ) 30   PHQ2-9    Flowsheet Row Counselor from 09/08/2023 in [facility] Office Visit from 06/22/2023 in [facility] Office Visit from 02/25/2023 in [facility] Office Visit from 01/07/2023 in [facility] Office Visit from 10/12/2022 in [facility]  PHQ-2 Total Score 3 6 5 6 4   PHQ-9 Total Score 18 -- 17 22 11    Flowsheet Row Admission (Discharged) from 09/30/2023 in [facility] Counselor from 09/08/2023 in [facility] Office Visit from 03/31/2022 in [facility]  C-SSRS RISK CATEGORY No Risk No Risk No Risk     Assessment and Plan:  Ricky [name] is a 68 y.o. 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 diabetes, hypertension, hyperlipidemia, carpal tunnel syndrome, s/p knee arthroscopy , who presents for follow up appointment for below.    1. MDD (major depressive disorder), recurrent episode, mild (HCC) 2. 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 depression since 1979, ECT in 2023 with limited benefit, originally on duloxetine  60 mg daily, bupropion  150 mg daily Abilify  30 mg daily        The exam is notable for restricted affect, and he reports exhaustion, decrease in appetite, which is multifactorial given his medical condition, which includes completing cisplatin  treatment, recent PEG placement.  Both the patient and his significant other agreed to stay on the current medication regimen at this time to avoid any possible adverse reaction/relapse, with plans to reevaluate once his condition stabilizes.  Will continue sertraline  to target depression  and anxiety.  Will continue bupropion  adjunctive treatment for depression, and BuSpar  for anxiety.    Noted that he is not a good candidate for stimulant due to history of adverse reaction of  restlessness in the past.  The treatment option is also quite limited due to history of parkinsonian symptoms.  Although he was previously accepted TMS treatment, this is on hold due to the current medical condition.    Of note, during the visit, concerns were raised regarding marital communication, as his significant other appears to be feeling very overwhelmed by his current condition. His wife also expressed concern about how his family, particularly his mother has been treating him during this time, though he reports being accustomed to it. He would likely benefit greatly from CBT and having a safe space to explore these issues. He agrees to keep an upcoming appointment.    # benzodiazepine dependence (prescribed) - tapered down from 1 mg TID since April 2023, which was uptitrated back Feb 2025 Although he was adjusting well to tapering down medication 2 years ago, his anxiety has worsened in the setting of cancer diagnosis.  He reports good benefit since uptitration without any side effects of drowsiness, oversedation or fall.  Both the patient and his wife is aware of potential risk of respiratory suppression with concomitant use of opioid.    # Insomnia 3. Fatigue, unspecified type 4. Obstructive sleep apnea # vitamin D  deficiency - According to the recent sleep study, he has severe obstructive sleep apnea, AHI of 31.  - Ritalin  5 mg did not make any difference, but cause adverse reaction of restlessness.  Slight improvement in insomnia. Fatigue is multifactorial due to current medical condition.  He reports good benefit from trazodone , an additional dose of clonazepam  at night.  Will continue current medication regimen while monitoring any risk of fall, and confusion.     5. Cognitive deficits Functional Status   IADL: Independent in the following:driving (advised to refrain from driving 05/6107)           Requires assistance with the following:  managing finances, medications (using pill  box, his wife checks) ADL  Independent in the following: bathing and hygiene, feeding, continence, grooming and toileting, walking          Requires assistance with the following: Folate, Vitamin B12, TSH- low folic acid  08/2022. Otherwise wnl Images Brain MRI 08/2021: There is no evidence of an acute infarct, intracranial hemorrhage, mass, midline shift, or extra-axial fluid collection.Small T2 hyperintensities in the cerebral white matter bilaterally are nonspecific but compatible with mild chronic small vessel ischemic disease. Dilated perivascular spaces are noted in the basal ganglia bilaterally. There is mild generalized cerebral atrophy. Neuropsych assessment:  Etiology: parkinson, r/o VaD   He denies any subjective symptoms of memory loss.  However, his IADL is somewhat limited. Will continue to assess this.   Plan  Continue sertraline  150 mg at night (limited benefit from higher dose) Continue bupropion  450 mg daily Continue Buspar  7.5 mg twice a day (restless leg from 10 mg BID) Continue trazodone  100 mg at night as needed for insomnia Continue clonazepam  1 mg three times a day Next appointment-  7/14 at 11:30  IP, or contact for sooner appointment They are advised to contact the sleep clinic regarding the result - TSH- 4.238 10/2022   Past trials of medication (the following medication caused either side effect, or limited benefit): lexapro, duloxetine , venlafaxine, mirtazapine (rash), Abilify , quetiapine  (insomnia, RLS)   The patient demonstrates  the following risk factors for suicide: Chronic risk factors for suicide include: psychiatric disorder of depression and chronic pain. Acute risk factors for suicide include: unemployment. Protective factors for this patient include: positive social support, coping skills and hope for the future. Considering these factors, the overall suicide risk at this point appears to be low. Patient is appropriate for outpatient follow  up.  Collaboration of Care: Collaboration of Care: {BH OP Collaboration of Care:21014065}  Patient/Guardian was advised Release of Information must be obtained prior to any record release in order to collaborate their care with an outside provider. Patient/Guardian was advised if they have not already done so to contact the registration department to sign all necessary forms in order for us  to release information regarding their care.   Consent: Patient/Guardian gives verbal consent for treatment and assignment of benefits for services provided during this visit. Patient/Guardian expressed understanding and agreed to proceed.    Todd Fossa, MD 10/29/2023, 3:05 PM

## 2023-11-02 ENCOUNTER — Encounter: Payer: Self-pay | Admitting: Surgery

## 2023-11-02 ENCOUNTER — Telehealth: Payer: Self-pay

## 2023-11-02 NOTE — Telephone Encounter (Signed)
 Nutrition  Called significant other, [name] for nutrition follow-up.  No answer.  Left message with call back number.  Fabiha Rougeau B. Dasie SOLON, CSO, LDN Registered Dietitian [phone]

## 2023-11-03 ENCOUNTER — Telehealth: Payer: Self-pay | Admitting: *Deleted

## 2023-11-03 ENCOUNTER — Ambulatory Visit: Admitting: Licensed Clinical Social Worker

## 2023-11-03 ENCOUNTER — Telehealth: Admitting: Psychiatry

## 2023-11-03 ENCOUNTER — Encounter: Payer: Self-pay | Admitting: Psychiatry

## 2023-11-03 DIAGNOSIS — F411 Generalized anxiety disorder: Secondary | ICD-10-CM | POA: Diagnosis not present

## 2023-11-03 DIAGNOSIS — F330 Major depressive disorder, recurrent, mild: Secondary | ICD-10-CM | POA: Diagnosis not present

## 2023-11-03 DIAGNOSIS — G4700 Insomnia, unspecified: Secondary | ICD-10-CM | POA: Diagnosis not present

## 2023-11-03 NOTE — Patient Instructions (Signed)
 Continue sertraline  150 mg at night  Continue bupropion  300 mg daily  Continue trazodone  100 mg at night as needed for insomnia Decrease quetiapine  50 mg at night  Continue clonazepam  1 mg three times a day Hold trazodone , buspar  Next appointment-  7/14 at 11:30

## 2023-11-03 NOTE — Telephone Encounter (Signed)
 Faxed signed forms back to [facility] for patient.

## 2023-11-04 NOTE — Progress Notes (Signed)
 Nutrition (Late Entry)  Wife left message for RD yesterday am, returning RD's call from 6/24.  RD called wife back yesterday afternoon but was unable to speak with wife.  Left another message.   Michaeleen Down B. Dasie SOLON, CSO, LDN Registered Dietitian [phone]

## 2023-11-05 ENCOUNTER — Inpatient Hospital Stay

## 2023-11-05 ENCOUNTER — Other Ambulatory Visit: Payer: Self-pay | Admitting: Psychiatry

## 2023-11-05 NOTE — Progress Notes (Addendum)
 Nutrition Follow-up:  Patient with stage I SCC of oropharynx, p 16+.  Patient has completed chemotherapy and radiation.  Recent admission to hospital.  Noted was on continuous tube feeding during admission and insurance denied continuous tube feeding at discharge.    Spoke with wife via phone for nutrition follow-up.  Says that appetite is improving ate pizza yesterday.  Has eaten cheese dog and other foods that he has not eaten in awhile.  Taste is still not back.  Has issues with anxiety when giving tube feeding.  Wife has been giving mostly 1 carton of nutren in the evening if has not eaten much during the day or if patient does not feel full from oral intake.  Sometimes give more depending on how he feels.  Says that he has not slept well in the last few nights and medications being adjusted to help with that.  Drinking 2-3 large containers of fluid daily.      Medications: reviewed  Labs: 6/17 K 4.6, phosphorus 4.0, Magnesium  2.0  Anthropometrics:   Weight 146 lb on 6/3 145 lb 5/27 UBW of 177 lb on 06/24/23   Estimated Energy Needs  Kcals: 1750-2100 Protein: 84-105 g Fluid: 1750-21102ml  NUTRITION DIAGNOSIS: Inadequate oral intake improving   MALNUTRITION DIAGNOSIS: Severe malnutrition continues   INTERVENTION:  Encouraged tube feeding if unable to eat meal, use as snack in between meals for added nutrition Continue high calorie, high protein foods to promote weight gain Discussed with wife if not using feeding tube need to flush tube with at least 60ml of water  daily to keep tube patent. She verbalized understanding [employer] providing enteral supplies    MONITORING, EVALUATION, GOAL: weight trends, intake, tube feeding    NEXT VISIT: phone, Friday, July 11   Sony Schlarb B. Dasie SOLON, CSO, LDN Registered Dietitian [phone]

## 2023-11-09 ENCOUNTER — Encounter: Payer: Self-pay | Admitting: Hospice and Palliative Medicine

## 2023-11-09 ENCOUNTER — Inpatient Hospital Stay

## 2023-11-09 ENCOUNTER — Inpatient Hospital Stay (HOSPITAL_BASED_OUTPATIENT_CLINIC_OR_DEPARTMENT_OTHER): Admitting: Hospice and Palliative Medicine

## 2023-11-09 ENCOUNTER — Inpatient Hospital Stay: Attending: Oncology

## 2023-11-09 VITALS — BP 121/67 | HR 65 | Temp 98.9°F | Resp 20 | Wt 150.2 lb

## 2023-11-09 DIAGNOSIS — F32A Depression, unspecified: Secondary | ICD-10-CM | POA: Diagnosis not present

## 2023-11-09 DIAGNOSIS — F419 Anxiety disorder, unspecified: Secondary | ICD-10-CM | POA: Insufficient documentation

## 2023-11-09 DIAGNOSIS — R1310 Dysphagia, unspecified: Secondary | ICD-10-CM | POA: Diagnosis not present

## 2023-11-09 DIAGNOSIS — G2581 Restless legs syndrome: Secondary | ICD-10-CM | POA: Diagnosis not present

## 2023-11-09 DIAGNOSIS — Z931 Gastrostomy status: Secondary | ICD-10-CM | POA: Insufficient documentation

## 2023-11-09 DIAGNOSIS — Z95828 Presence of other vascular implants and grafts: Secondary | ICD-10-CM

## 2023-11-09 DIAGNOSIS — C109 Malignant neoplasm of oropharynx, unspecified: Secondary | ICD-10-CM | POA: Insufficient documentation

## 2023-11-09 DIAGNOSIS — G893 Neoplasm related pain (acute) (chronic): Secondary | ICD-10-CM | POA: Diagnosis not present

## 2023-11-09 DIAGNOSIS — R634 Abnormal weight loss: Secondary | ICD-10-CM | POA: Diagnosis not present

## 2023-11-09 LAB — CMP (CANCER CENTER ONLY)
ALT: 5 U/L (ref 0–44)
AST: 21 U/L (ref 15–41)
Albumin: 4.2 g/dL (ref 3.5–5.0)
Alkaline Phosphatase: 41 U/L (ref 38–126)
Anion gap: 7 (ref 5–15)
BUN: 21 mg/dL (ref 8–23)
CO2: 26 mmol/L (ref 22–32)
Calcium: 9.2 mg/dL (ref 8.9–10.3)
Chloride: 104 mmol/L (ref 98–111)
Creatinine: 1.13 mg/dL (ref 0.61–1.24)
GFR, Estimated: >60 mL/min (ref >60)
Glucose, Bld: 144 mg/dL — ABNORMAL HIGH (ref 70–99)
Potassium: 3.8 mmol/L (ref 3.5–5.1)
Sodium: 137 mmol/L (ref 135–145)
Total Bilirubin: 0.5 mg/dL (ref 0.0–1.2)
Total Protein: 6.8 g/dL (ref 6.5–8.1)

## 2023-11-09 LAB — CBC WITH DIFFERENTIAL (CANCER CENTER ONLY)
Abs Immature Granulocytes: 0.02 10*3/uL (ref 0.00–0.07)
Basophils Absolute: 0 10*3/uL (ref 0.0–0.1)
Basophils Relative: 1 %
Eosinophils Absolute: 0.1 10*3/uL (ref 0.0–0.5)
Eosinophils Relative: 3 %
HCT: 26.1 % — ABNORMAL LOW (ref 39.0–52.0)
Hemoglobin: 9 g/dL — ABNORMAL LOW (ref 13.0–17.0)
Immature Granulocytes: 1 %
Lymphocytes Relative: 10 %
Lymphs Abs: 0.4 10*3/uL — ABNORMAL LOW (ref 0.7–4.0)
MCH: 32.3 pg (ref 26.0–34.0)
MCHC: 34.5 g/dL (ref 30.0–36.0)
MCV: 93.5 fL (ref 80.0–100.0)
Monocytes Absolute: 0.2 10*3/uL (ref 0.1–1.0)
Monocytes Relative: 7 %
Neutro Abs: 2.9 10*3/uL (ref 1.7–7.7)
Neutrophils Relative %: 78 %
Platelet Count: 95 10*3/uL — ABNORMAL LOW (ref 150–400)
RBC: 2.79 MIL/uL — ABNORMAL LOW (ref 4.22–5.81)
RDW: 12.5 % (ref 11.5–15.5)
WBC Count: 3.7 10*3/uL — ABNORMAL LOW (ref 4.0–10.5)
nRBC: 0 % (ref 0.0–0.2)

## 2023-11-09 MED ORDER — HEPARIN SOD (PORK) LOCK FLUSH 100 UNIT/ML IV SOLN
500.0000 [IU] | Freq: Once | INTRAVENOUS | Status: AC
  Administered 2023-11-09: 500 [IU]
  Filled 2023-11-09: qty 5

## 2023-11-09 MED ORDER — GABAPENTIN 100 MG PO CAPS: 100.0000 mg | ORAL_CAPSULE | Freq: Every day | ORAL | 0 refills | Status: DC

## 2023-11-09 NOTE — Progress Notes (Signed)
 Symptom Management Clinic [facility] [phone] Fax:([phone]) [phone]  Patient Care Team: Fernande Ophelia JINNY DOUGLAS, MD as PCP - General (Internal Medicine) Melanee Annah BROCKS, MD as Consulting Physician (Oncology)   NAME OF PATIENT: Ricky [name]  [id number]  13-Oct-1955   DATE OF VISIT: 11/09/23  REASON FOR CONSULT: Ricky [name] is a 68 y.o. 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   INTERVAL HISTORY: Patient saw Dr. Melanee on 09/14/2023.  Patient is status post concurrent chemoradiation.  He completed cycle 7 cisplatin  on 09/14/2023.  Patient with persistent poor oral intake and weight loss.  Underwent PEG placement on 09/30/2023.    Patient was hospitalized at [facility] 10/15/2023 to 10/24/2023 with altered mental status.  This was thought secondary to polypharmacy due to Sinemet  and Wellbutrin .  Patient was taken off Wellbutrin  and started on nightly Seroquel .  He has had follow-up with outpatient psychiatry.  Patient has intermittently required IV fluids and supportive care in [facility].  He returns today for repeat labs and consideration of additional supportive care.  Today, patient states he is doing well.  Patient denies fever or chills.  Reports that he is eating and drinking well and has had some weight gain.  He is using his PEG daily.    States that pain is well-controlled.  He endorses restless legs, which is keeping him awake at night.  He had follow-up recently with psychiatry who dose reduced his Seroquel  thinking that that might be contributing to his restless legs.  However, patient says that he just self discontinued it and has been off for the past 2 weeks and symptoms have not improved.  Patient denies nausea, vomiting, diarrhea or constipation.  Denies pain.  No urinary complaints. Denies respiratory complaints or chest pain.  Patient offers no further  specific complaints today.  PAST MEDICAL HISTORY: Past Medical History:  Diagnosis Date   Anemia    Anxiety    Arthritis    Carpal tunnel syndrome    Depression    Diabetes (HCC)    Hyperlipidemia    Hypertension    Loss of teeth due to extraction    Neoplasm related pain    Parkinson's disease (HCC)     PAST SURGICAL HISTORY:  Past Surgical History:  Procedure Laterality Date   CARPAL TUNNEL RELEASE Right 11/28/2020   Procedure: CARPAL TUNNEL RELEASE ENDOSCOPIC;  Surgeon: Edie Norleen JINNY, MD;  Location: [facility] ORS;  Service: Orthopedics;  Laterality: Right;   CARPAL TUNNEL RELEASE Left 01/22/2021   Procedure: CARPAL TUNNEL RELEASE ENDOSCOPIC;  Surgeon: Edie Norleen JINNY, MD;  Location: [facility] ORS;  Service: Orthopedics;  Laterality: Left;   INSERTION, GASTROSTOMY TUBE, ROBOT-ASSISTED N/A 09/30/2023   Procedure: INSERTION, GASTROSTOMY TUBE, ROBOT-ASSISTED;  Surgeon: Jordis Laneta FALCON, MD;  Location: [facility] ORS;  Service: General;  Laterality: N/A;   IR IMAGING GUIDED PORT INSERTION  06/30/2023   KNEE ARTHROSCOPY Right    KNEE ARTHROSCOPY Left    MANDIBLE FRACTURE SURGERY     as teenager    HEMATOLOGY/ONCOLOGY HISTORY:  Oncology History  Squamous cell carcinoma of oropharynx (HCC)  06/22/2023 Initial Diagnosis   Squamous cell carcinoma of oropharynx (HCC)   06/22/2023 Cancer Staging   Staging form: Pharynx - HPV-Mediated Oropharynx, AJCC 8th Edition - Clinical stage from 06/22/2023: Stage I (cT2, cN1, cM0, p16+) - Signed by Melanee Annah BROCKS, MD on 07/12/2023 Stage prefix: Initial diagnosis   07/12/2023 -  Chemotherapy   Patient is on Treatment Plan : HEAD/NECK Cisplatin  (40) q7d       ALLERGIES:  is allergic to mirtazapine and other.  MEDICATIONS:  Current Outpatient Medications  Medication Sig Dispense Refill   ACCU-CHEK GUIDE TEST test strip TEST 2X DAILY     Accu-Chek Softclix Lancets lancets 2 (two) times daily.     atenolol (TENORMIN) 50 MG tablet Take 50 mg by mouth daily.      Blood Glucose Monitoring Suppl (ACCU-CHEK GUIDE ME) w/Device KIT See admin instructions.     buPROPion  (WELLBUTRIN  XL) 300 MG 24 hr tablet Take 1 tablet (300 mg total) by mouth daily. Total of 450 mg daily. Take along with 150 mg tab 30 tablet 5   calcium  carbonate (CALCIUM  600) 600 MG TABS tablet Take 1 tablet (600 mg total) by mouth daily. For low calcium  (Patient not taking: Reported on 10/26/2023) 60 tablet 0   calcium  carbonate (TUMS) 500 MG chewable tablet Chew 500 mg by mouth daily.     carbidopa -levodopa  (SINEMET  IR) 25-100 MG tablet Take 2-3 tablets by mouth See admin instructions. Take 3 tablets in morning 2 tablet at bedtime     clonazePAM  (KLONOPIN ) 1 MG tablet Take 1 tablet (1 mg total) by mouth 3 (three) times daily as needed for anxiety. 90 tablet 1   cyanocobalamin  (VITAMIN B12) 1000 MCG tablet Take 1,000 mcg by mouth daily.     fentaNYL  (DURAGESIC ) 12 MCG/HR Place 1 patch onto the skin every 3 (three) days. 10 patch 0   folic acid  (FOLVITE ) 1 MG tablet Take 1 tablet (1 mg total) by mouth daily. 30 tablet 3   ketoconazole (NIZORAL) 2 % shampoo Apply 1 application  topically every other day.     lidocaine -prilocaine  (EMLA ) cream Apply to affected area once 30 g 3   magnesium  chloride (SLOW-MAG) 64 MG TBEC SR tablet Take 2 tablets (128 mg total) by mouth 2 (two) times daily. For low magnesium  (Patient not taking: Reported on 10/26/2023) 120 tablet 00   metFORMIN  (GLUCOPHAGE ) 500 MG tablet Take 500 mg by mouth daily with breakfast.     Multiple Vitamin (MULTI-VITAMIN) tablet Take 1 tablet by mouth daily.     Nutritional Supplements (NUTREN 1.5) LIQD Give 1 carton in feeding tube via bolus syringe 5 times a day.  Flush with 60cc of water  before and 120cc of water  after. (Patient not taking: Reported on 10/12/2023)     ondansetron  (ZOFRAN ) 8 MG tablet Take 1 tablet (8 mg total) by mouth every 8 (eight) hours as needed for nausea or vomiting. Start on the third day after cisplatin . (Patient not  taking: Reported on 10/26/2023) 30 tablet 1   Oxycodone  HCl 10 MG TABS Take 1 tablet (10 mg total) by mouth every 4 (four) hours as needed. 60 tablet 0   polyethylene glycol (MIRALAX / GLYCOLAX) 17 g packet Take 17 g by mouth daily. (Patient not taking: Reported on 10/26/2023)     prochlorperazine  (COMPAZINE ) 10 MG tablet TAKE 1 TABLET(10 MG) BY MOUTH EVERY 6 HOURS AS NEEDED FOR NAUSEA OR VOMITING (Patient not taking: Reported on 10/26/2023) 30 tablet 1   QUEtiapine  (SEROQUEL ) 50 MG tablet Take 50 mg by mouth at bedtime.     rosuvastatin  (CRESTOR ) 20 MG tablet Take 20 mg by mouth daily.     senna (SENOKOT) 8.6 MG tablet Take 1 tablet by mouth 2 (two) times daily.     sertraline  (ZOLOFT ) 100 MG tablet Take 1.5 tablets (150 mg  total) by mouth at bedtime. 45 tablet 3   sucralfate  (CARAFATE ) 1 g tablet Take 1 tablet (1 g total) by mouth 3 (three) times daily before meals. Dissolve in 4 tbs of warm water , swish and swallow (Patient not taking: Reported on 10/26/2023) 90 tablet 1   tadalafil (CIALIS) 5 MG tablet Take 5 mg by mouth daily. (Patient not taking: Reported on 10/26/2023)     tamsulosin (FLOMAX) 0.4 MG CAPS capsule Take 0.4 mg by mouth daily.     traZODone  (DESYREL ) 50 MG tablet Take 0.5-2 tablets (25-100 mg total) by mouth at bedtime as needed for sleep. 60 tablet 3   No current facility-administered medications for this visit.    VITAL SIGNS: There were no vitals taken for this visit. There were no vitals filed for this visit.   Estimated body mass index is 21.56 kg/m as calculated from the following:   Height as of 10/12/23: 5' 9 (1.753 m).   Weight as of 10/12/23: 146 lb (66.2 kg).  LABS: CBC:    Component Value Date/Time   WBC 3.8 (L) 10/26/2023 1252   WBC 2.0 (L) 09/30/2023 0632   HGB 8.5 (L) 10/26/2023 1252   HCT 25.1 (L) 10/26/2023 1252   HCT 24.4 (L) 09/14/2023 0836   PLT 102 (L) 10/26/2023 1252   MCV 94.7 10/26/2023 1252   NEUTROABS 3.2 10/26/2023 1252   LYMPHSABS 0.3 (L)  10/26/2023 1252   MONOABS 0.3 10/26/2023 1252   EOSABS 0.1 10/26/2023 1252   BASOSABS 0.0 10/26/2023 1252   Comprehensive Metabolic Panel:    Component Value Date/Time   NA 134 (L) 10/26/2023 1252   K 4.6 10/26/2023 1252   CL 103 10/26/2023 1252   CO2 23 10/26/2023 1252   BUN 37 (H) 10/26/2023 1252   CREATININE 1.45 (H) 10/26/2023 1252   GLUCOSE 144 (H) 10/26/2023 1252   CALCIUM  9.1 10/26/2023 1252   AST 24 10/26/2023 1252   ALT 10 10/26/2023 1252   ALKPHOS 39 10/26/2023 1252   BILITOT 0.9 10/26/2023 1252   PROT 6.8 10/26/2023 1252   ALBUMIN 4.1 10/26/2023 1252    RADIOGRAPHIC STUDIES: No results found.   PERFORMANCE STATUS (ECOG) : 1 - Symptomatic but completely ambulatory  Review of Systems Unless otherwise noted, a complete review of systems is negative.  Physical Exam General: NAD Cardiovascular: regular rate and rhythm Pulmonary: clear ant fields Abdomen: soft, nontender, + bowel sounds, PEG GU: no suprapubic tenderness Extremities: no edema, no joint deformities Skin: no rashes Neurological: Weakness but otherwise nonfocal  IMPRESSION/PLAN: Squamous cell carcinoma of the head neck -s/p concurrent cisplatin /XRT.  Pending PET scan on 11/23/2023  Dysphagia/poor oral intake-patient status post PEG.  Improving.  Weight up 4 pounds over the past 3 weeks.  No significant electrolyte derangements noted on labs.  Patient does not appear to be clinically dehydrated.  Serum creatinine has normalized.  RLS -start gabapentin  100 mg at bedtime  Anxiety/Depression -follow-up with psychiatry  Neoplasm related pain -continue fentanyl /oxycodone .  Can consider weaning as tolerated.  Pending repeat PET scan and they will follow-up with MD.  Can be seen sooner as needed  Patient expressed understanding and was in agreement with this plan. He also understands that He can call clinic at any time with any questions, concerns, or complaints.   Thank you for allowing me to  participate in the care of this very pleasant patient.   Time Total: 15 minutes  Visit consisted of counseling and education dealing with the complex and  emotionally intense issues of symptom management in the setting of serious illness.Greater than 50%  of this time was spent counseling and coordinating care related to the above assessment and plan.  Signed by: Fonda Mower, PhD, NP-C

## 2023-11-09 NOTE — Progress Notes (Signed)
 Patient states he isn't getting any sleep due to having restless legs. Patient believe that one of his medication is causing this (Seroquel ). Patient states he has stopped the seroquel  because of this side affects.

## 2023-11-12 ENCOUNTER — Other Ambulatory Visit: Payer: Self-pay | Admitting: Oncology

## 2023-11-12 DIAGNOSIS — C109 Malignant neoplasm of oropharynx, unspecified: Secondary | ICD-10-CM

## 2023-11-13 ENCOUNTER — Other Ambulatory Visit: Payer: Self-pay | Admitting: Oncology

## 2023-11-13 DIAGNOSIS — C109 Malignant neoplasm of oropharynx, unspecified: Secondary | ICD-10-CM

## 2023-11-15 ENCOUNTER — Other Ambulatory Visit: Payer: Self-pay

## 2023-11-15 ENCOUNTER — Encounter: Payer: Self-pay | Admitting: Oncology

## 2023-11-15 DIAGNOSIS — C109 Malignant neoplasm of oropharynx, unspecified: Secondary | ICD-10-CM

## 2023-11-15 MED ORDER — FOLIC ACID 1 MG PO TABS: 1.0000 mg | ORAL_TABLET | Freq: Every day | ORAL | 3 refills | Status: DC

## 2023-11-15 MED ORDER — PROCHLORPERAZINE MALEATE 10 MG PO TABS: 10.0000 mg | ORAL_TABLET | Freq: Four times a day (QID) | ORAL | 1 refills | Status: AC | PRN

## 2023-11-17 ENCOUNTER — Ambulatory Visit: Admitting: Licensed Clinical Social Worker

## 2023-11-17 DIAGNOSIS — F411 Generalized anxiety disorder: Secondary | ICD-10-CM

## 2023-11-17 DIAGNOSIS — F330 Major depressive disorder, recurrent, mild: Secondary | ICD-10-CM

## 2023-11-17 DIAGNOSIS — G4700 Insomnia, unspecified: Secondary | ICD-10-CM

## 2023-11-17 NOTE — Progress Notes (Signed)
 THERAPIST PROGRESS NOTE  Session Time: 1-1:57pm  Participation Level: Active  Behavioral Response: CasualAlertDepressed  Type of Therapy: Individual Therapy  Treatment Goals addressed:  Active     Anxiety     LTG: Report a decrease in anxiety symptoms as evidenced by an overall reduction in anxiety score by a minimum of 25% on the Generalized Anxiety Disorder Scale (GAD-7)     Start:  09/08/23    Expected End:  03/09/24         STG - Misc 1     Start:  09/08/23    Expected End:  03/09/24      Identify and Challenge Three Negative Thoughts: Become aware of distorted or unhelpful thinking patterns and replace them with more balanced and constructive thoughts.      Perform psychoeducation regarding anxiety disorders     Start:  09/08/23         Work with patient individually to identify the major components of a recent episode of anxiety: physical symptoms, major thoughts and images, and major behaviors they experienced     Start:  09/08/23         Coping Skills     Start:  09/08/23       Will work with the pt using CBT/DBT techniques to help the pt verbalize an understanding of the cognitive, physiological, and behavioral components of anxiety and its treatment. This will be done by using worksheets, interactive activities, CBT/ABC thought logs, modeling, homework, role playing and journaling. Will work with pt to learn and implement coping skills that result in a reduction of anxiety and improve daily functioning per pt self-report 3 out of 5 documented sessions.         OP Depression     LTG: Reduce frequency, intensity, and duration of depression symptoms so that daily functioning is improved     Start:  09/08/23    Expected End:  03/09/24         LTG: Increase coping skills to manage depression and improve ability to perform daily activities     Start:  09/08/23    Expected End:  03/09/24         STG: Ricky [name] will identify cognitive patterns and  beliefs that support depression     Start:  09/08/23    Expected End:  03/09/24         LTG: To be normal [people being happy, getting around, seeing my son again, communicating with others] like everybody else.     Start:  09/08/23    Expected End:  03/09/24         Work with Ricky [name] to identify the major components of a recent episode of depression: physical symptoms, major thoughts and images, and major behaviors they experienced     Start:  09/08/23         Coping Skills      Start:  09/08/23       Will work with the pt using CBT/DBT techniques to help the pt verbalize an understanding of the cognitive, physiological, and behavioral components of depression and its treatment. This will be done by using worksheets, interactive activities, CBT/ABC thought logs, modeling, homework, role playing and journaling. Will work with pt to learn and implement coping skills that result in a reduction of depression and improve daily functioning per pt self-report 3 out of 5 documented sessions.          ProgressTowards Goals: Progressing  Interventions: Supportive and  Other: Mindfulness  Summary:  Ricky [name] is a 68 y.o. male who presents with lack of motivation, anhedonia, feelings of anxiety, tension, a negative outlook, and uncontrollable worry. Pt was oriented times 5. Pt was cooperative and engaged. Pt denies SI/HI/AVH.     The patient attended the session reporting increased energy levels but continued to experience high anxiety. He described symptoms of uncontrollable worry, difficulty falling asleep and staying asleep, and experiencing panic attacks. The patient mentioned that he had a near panic attack the morning of his appointment, but was able to take medication to alleviate some of his anxious symptoms.  He indicated that due to medication side effects, he has been sleeping poorly and dealing with racing thoughts related to the uncertainty surrounding his cancer  prognosis, as he is still unclear about his health situation. The patient reported that his anxiety is often heightened in the mornings, but he cannot identify specific triggers for these feelings. He has been on anxiety medication since he was 21 and expressed concerns regarding recent changes to his medication.  Additionally, the patient noted that he has limited use of coping skills during panic attacks but has learned coping strategies in previous therapeutic sessions. The clinician worked with him to identify triggers for his anxiety, which include fears about his own health decline stemming from witnessing his father's decline from cancer and concerns about his mother's wellbeing.   The clinician assisted the patient in challenging his patterns of thinking and worrying about the future while also identifying factors he can control. The clinician educated the patient on mindfulness techniques, including acupressure breathing, belly breathing, and 7-11 breathing. The patient also received psychoeducation about anxiety and its common symptoms. Furthermore, the clinician provided brief information on positive brain chemicals such as dopamine, oxytocin, serotonin, and endorphins, along with suggestions for accessing these through minor changes in his daily routine.  For homework, patient was asked to practice mindfulness techniques routinely until next session.  Suicidal/Homicidal: Nowithout intent/plan  Therapist Response: Clinician utilized active and supportive reflection to create a safe space for patient to process recent events. Clinician assessed for current symptoms, stressors, seeking support session.  Worked with patient to reflect on the triggers for his anxiety and addressed small changes he can make to his day-to-day routine to decrease anxious episodes.  Clinician also educated patient on mindfulness techniques to manage panic attacks.  Clinician provided brief psychoeducation on common  knowledge around anxiety.   Plan: Return again in 2 weeks.  Diagnosis: MDD (major depressive disorder), recurrent episode, mild (HCC)  Anxiety state  Insomnia, unspecified type   Collaboration of Care: AEB psychiatrist can access notes and cln. Will review psychiatrists' notes. Check in with the patient and will see LCSW per availability. Patient agreed with treatment recommendations.   Patient/Guardian was advised Release of Information must be obtained prior to any record release in order to collaborate their care with an outside provider. Patient/Guardian was advised if they have not already done so to contact the registration department to sign all necessary forms in order for us  to release information regarding their care.   Consent: Patient/Guardian gives verbal consent for treatment and assignment of benefits for services provided during this visit. Patient/Guardian expressed understanding and agreed to proceed.   Ricky KATHEE Husband, LCSW 11/17/2023

## 2023-11-18 NOTE — Progress Notes (Signed)
 [facility] MD/PA/NP OP Progress Note  11/22/2023 12:31 PM Ricky [name]  MRN:  [id number]  Chief Complaint:  Chief Complaint  Patient presents with   Follow-up   HPI:  - since the last visit, he was seen by [facility], Fonda SAUNDERS, NP. Patient reportedly discontinued quetiapine  for the past two weeks, and continues to have restless leg. Gabapentin  100 mg was started  This is a follow-up appointment for depression, anxiety and insomnia.  He states that he continued to quetiapine  as he was doing better in terms of insomnia and depression.  He is not sure how long he held his medication.  He states worsening his mood is manageable with gabapentin  without any drowsiness.  Although he has been eating more, he cannot taste, and he has sore throat.  He enjoyed the visit of his son, daughter-in-law and their baby. He is concerned about the upcoming PET, which he would be informed of result after a few weeks.  Although he is concerned about this every day, he denies much concern about the anxiety itself.  He denies having any hobby.  However, he agrees that he enjoyed seeing deer in the yard.  He sleeps fair.  He feels a little down.  He denies SI, HI, hallucinations.  He missed to take clonazepam  this morning and feels more anxious.  He denies panic attacks, and his anxiety is manageable when he takes clonazepam  regularly.   [name], his significant other presents to the visit.  She is not sure how long he was off quetiapine .  She occasionally helps him as his knee pops out of joint. She manually pulls it back.  He has been eating better. He appears to enjoy the recent visit of his son, who visited them before leaving for [place]. He used to enjoy hunting, mess with guns years ago. She agrees with the plans as outlined below.  Wt Readings from Last 3 Encounters:  11/22/23 152 lb 6.4 oz (69.1 kg)  11/09/23 150 lb 3.2 oz (68.1 kg)  10/12/23 146 lb (66.2 kg)     Visit Diagnosis:    ICD-10-CM   1. MDD (major  depressive disorder), recurrent episode, mild (HCC)  F33.0     2. Anxiety state  F41.1     3. Insomnia, unspecified type  G47.00     4. Restless leg syndrome  G25.81       Past Psychiatric History: Please see initial evaluation for full details. I have reviewed the history. No updates at this time.     Past Medical History:  Past Medical History:  Diagnosis Date   Anemia    Anxiety    Arthritis    Carpal tunnel syndrome    Depression    Diabetes (HCC)    Hyperlipidemia    Hypertension    Loss of teeth due to extraction    Neoplasm related pain    Parkinson's disease [facility])     Past Surgical History:  Procedure Laterality Date   CARPAL TUNNEL RELEASE Right 11/28/2020   Procedure: CARPAL TUNNEL RELEASE ENDOSCOPIC;  Surgeon: Edie Norleen PARAS, MD;  Location: [facility] ORS;  Service: Orthopedics;  Laterality: Right;   CARPAL TUNNEL RELEASE Left 01/22/2021   Procedure: CARPAL TUNNEL RELEASE ENDOSCOPIC;  Surgeon: Edie Norleen PARAS, MD;  Location: [facility] ORS;  Service: Orthopedics;  Laterality: Left;   INSERTION, GASTROSTOMY TUBE, ROBOT-ASSISTED N/A 09/30/2023   Procedure: INSERTION, GASTROSTOMY TUBE, ROBOT-ASSISTED;  Surgeon: Jordis Laneta FALCON, MD;  Location: [facility] ORS;  Service: General;  Laterality:  N/A;   IR IMAGING GUIDED PORT INSERTION  06/30/2023   KNEE ARTHROSCOPY Right    KNEE ARTHROSCOPY Left    MANDIBLE FRACTURE SURGERY     as teenager    Family Psychiatric History: Please see initial evaluation for full details. I have reviewed the history. No updates at this time.     Family History:  Family History  Problem Relation Age of Onset   Diabetes Mother    Hypertension Mother    CVA Father    Heart attack Father    Depression Sister    Depression Brother     Social History:  Social History   Socioeconomic History   Marital status: Significant Other    Spouse name: [name]   Number of children: Not on file   Years of education: Not on file   Highest education level: Not on  file  Occupational History   Not on file  Tobacco Use   Smoking status: Never    Passive exposure: Never   Smokeless tobacco: Never  Vaping Use   Vaping status: Never Used  Substance and Sexual Activity   Alcohol use: Not Currently   Drug use: Never   Sexual activity: Not Currently    Birth control/protection: None  Other Topics Concern   Not on file  Social History Narrative   Lives with friend   Social Drivers of Health   Financial Resource Strain: Low Risk  (10/28/2023)   Received from [facility]   Overall Financial Resource Strain (CARDIA)    Difficulty of Paying Living Expenses: Not hard at all  Food Insecurity: No Food Insecurity (10/28/2023)   Received from [facility]   Hunger Vital Sign    Within the past 12 months, you worried that your food would run out before you got the money to buy more.: Never true    Within the past 12 months, the food you bought just didn't last and you didn't have money to get more.: Never true  Transportation Needs: No Transportation Needs (10/28/2023)   Received from [facility] - Transportation    In the past 12 months, has lack of transportation kept you from medical appointments or from getting medications?: No    Lack of Transportation (Non-Medical): No  Physical Activity: Inactive (09/29/2023)   Exercise Vital Sign    Days of Exercise per Week: 0 days    Minutes of Exercise per Session: 0 min  Stress: No Stress Concern Present (09/29/2023)   [employer] of Occupational Health - Occupational Stress Questionnaire    Feeling of Stress : Not at all  Social Connections: Moderately Isolated (09/29/2023)   Social Connection and Isolation Panel    Frequency of Communication with Friends and Family: Three times a week    Frequency of Social Gatherings with Friends and Family: Twice a week    Attends Religious Services: Never    Database administrator or Organizations: No     Attends Banker Meetings: Never    Marital Status: Married    Allergies:  Allergies  Allergen Reactions   Mirtazapine Rash   Other Itching and Rash    ETCO2 tubing. Medtronic Microstream advance    Metabolic Disorder Labs: Lab Results  Component Value Date   HGBA1C 5.2 08/25/2021   MPG 102.54 08/25/2021   No results found for: PROLACTIN No results found for: CHOL, TRIG, HDL, CHOLHDL, VLDL, LDLCALC Lab Results  Component Value Date  TSH 2.016 09/14/2023   TSH 2.715 08/13/2022    Therapeutic Level Labs: No results found for: LITHIUM No results found for: VALPROATE No results found for: CBMZ  Current Medications: Current Outpatient Medications  Medication Sig Dispense Refill   ACCU-CHEK GUIDE TEST test strip TEST 2X DAILY     Accu-Chek Softclix Lancets lancets 2 (two) times daily.     atenolol (TENORMIN) 50 MG tablet Take 50 mg by mouth daily.     Blood Glucose Monitoring Suppl (ACCU-CHEK GUIDE ME) w/Device KIT See admin instructions.     buPROPion  (WELLBUTRIN  XL) 300 MG 24 hr tablet Take 1 tablet (300 mg total) by mouth daily. Total of 450 mg daily. Take along with 150 mg tab 30 tablet 5   calcium  carbonate (CALCIUM  600) 600 MG TABS tablet Take 1 tablet (600 mg total) by mouth daily. For low calcium  60 tablet 0   calcium  carbonate (TUMS) 500 MG chewable tablet Chew 500 mg by mouth daily.     carbidopa -levodopa  (SINEMET  IR) 25-100 MG tablet Take 2-3 tablets by mouth See admin instructions. Take 3 tablets in morning 2 tablet at bedtime (Patient taking differently: Take 1 tablet by mouth in the morning and at bedtime.)     cyanocobalamin  (VITAMIN B12) 1000 MCG tablet Take 1,000 mcg by mouth daily.     fentaNYL  (DURAGESIC ) 12 MCG/HR Place 1 patch onto the skin every 3 (three) days. 10 patch 0   folic acid  (FOLVITE ) 1 MG tablet Take 1 tablet (1 mg total) by mouth daily. 30 tablet 3   gabapentin  (NEURONTIN ) 100 MG capsule Take 1 capsule (100  mg total) by mouth at bedtime. 30 capsule 0   ketoconazole (NIZORAL) 2 % shampoo Apply 1 application  topically every other day.     lidocaine -prilocaine  (EMLA ) cream Apply to affected area once 30 g 3   magnesium  chloride (SLOW-MAG) 64 MG TBEC SR tablet Take 2 tablets (128 mg total) by mouth 2 (two) times daily. For low magnesium  120 tablet 00   metFORMIN  (GLUCOPHAGE ) 500 MG tablet Take 500 mg by mouth daily with breakfast.     Multiple Vitamin (MULTI-VITAMIN) tablet Take 1 tablet by mouth daily.     Nutritional Supplements (NUTREN 1.5) LIQD Give 1 carton in feeding tube via bolus syringe 5 times a day.  Flush with 60cc of water  before and 120cc of water  after.     ondansetron  (ZOFRAN ) 8 MG tablet Take 1 tablet (8 mg total) by mouth every 8 (eight) hours as needed for nausea or vomiting. Start on the third day after cisplatin . 30 tablet 1   Oxycodone  HCl 10 MG TABS Take 1 tablet (10 mg total) by mouth every 4 (four) hours as needed. 60 tablet 0   polyethylene glycol (MIRALAX / GLYCOLAX) 17 g packet Take 17 g by mouth daily.     prochlorperazine  (COMPAZINE ) 10 MG tablet Take 1 tablet (10 mg total) by mouth every 6 (six) hours as needed for nausea or vomiting. 30 tablet 1   QUEtiapine  (SEROQUEL ) 50 MG tablet Take 50 mg by mouth at bedtime.     rosuvastatin  (CRESTOR ) 20 MG tablet Take 20 mg by mouth daily.     senna (SENOKOT) 8.6 MG tablet Take 1 tablet by mouth 2 (two) times daily.     sertraline  (ZOLOFT ) 100 MG tablet Take 1.5 tablets (150 mg total) by mouth at bedtime. 45 tablet 3   sucralfate  (CARAFATE ) 1 g tablet Take 1 tablet (1 g total) by mouth 3 (  three) times daily before meals. Dissolve in 4 tbs of warm water , swish and swallow 90 tablet 1   tadalafil (CIALIS) 5 MG tablet Take 5 mg by mouth daily.     tamsulosin (FLOMAX) 0.4 MG CAPS capsule Take 0.4 mg by mouth daily.     traZODone  (DESYREL ) 50 MG tablet Take 0.5-2 tablets (25-100 mg total) by mouth at bedtime as needed for sleep. 60 tablet  3   [START ON 12/11/2023] clonazePAM  (KLONOPIN ) 1 MG tablet Take 1 tablet (1 mg total) by mouth 3 (three) times daily as needed for anxiety. 90 tablet 0   No current facility-administered medications for this visit.     Musculoskeletal: Strength & Muscle Tone: within normal limits Gait & Station: normal Patient leans: N/A  Psychiatric Specialty Exam: Review of Systems  Psychiatric/Behavioral:  Positive for dysphoric mood. Negative for agitation, behavioral problems, confusion, decreased concentration, hallucinations, self-injury, sleep disturbance and suicidal ideas. The patient is nervous/anxious. The patient is not hyperactive.   All other systems reviewed and are negative.   Blood pressure 110/73, pulse 83, temperature 98.1 F (36.7 C), temperature source Temporal, height 5' 9 (1.753 m), weight 152 lb 6.4 oz (69.1 kg).Body mass index is 22.51 kg/m.  General Appearance: Well Groomed  Eye Contact:  Good  Speech:  Clear and Coherent  Volume:  Normal  Mood:  okay  Affect:  Appropriate, Congruent, and restricted, calm  Thought Process:  Coherent  Orientation:  Full (Time, Place, and Person)  Thought Content: Logical   Suicidal Thoughts:  No  Homicidal Thoughts:  No  Memory:  Immediate;   Good  Judgement:  Good  Insight:  Good  Psychomotor Activity:  Normal, Normal tone, no rigidity, no resting/postural tremors, no tardive dyskinesia    Concentration:  Concentration: Good and Attention Span: Good  Recall:  Good  Fund of Knowledge: Good  Language: Good  Akathisia:  No  Handed:  Right  AIMS (if indicated):0   Assets:  Communication Skills Desire for Improvement  ADL's:  Intact  Cognition: WNL  Sleep:  Fair   Screenings: ECT-MADRS    Flowsheet Row ECT Treatment from 07/28/2021 in [facility]  MADRS Total Score 41   GAD-7    Flowsheet Row Counselor from 09/08/2023 in [facility] Office Visit from  02/25/2023 in [facility] Office Visit from 01/07/2023 in [facility] Office Visit from 10/12/2022 in [facility] Office Visit from 06/16/2022 in [facility]  Total GAD-7 Score 20 18 18 12 20    Mini-Mental    Flowsheet Row ECT Treatment from 07/28/2021 in [facility]  Total Score (max 30 points ) 30   PHQ2-9    Flowsheet Row Counselor from 09/08/2023 in [facility] Office Visit from 06/22/2023 in [facility] [place] Office Visit from 02/25/2023 in [facility] Office Visit from 01/07/2023 in [facility] Office Visit from 10/12/2022 in [facility]  PHQ-2 Total Score 3 6 5 6 4   PHQ-9 Total Score 18 -- 17 22 11    Flowsheet Row Admission (Discharged) from 09/30/2023 in [facility] Counselor from 09/08/2023 in [facility] Office Visit from 03/31/2022 in [facility]  C-SSRS RISK CATEGORY No Risk No Risk No Risk     Assessment and Plan:  Mansfield Dann is a 68 y.o. 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 diabetes, hypertension, hyperlipidemia, carpal tunnel syndrome, s/p knee arthroscopy , who presents for follow up appointment for below.   1. MDD (major depressive disorder), recurrent episode, mild (HCC) 2. 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 depression since 1979, ECT in 2023 with limited benefit, originally on duloxetine  60 mg daily, bupropion  150 mg daily Abilify  30 mg daily        Was on buspar  7.5 mg BID till 10/2023- d/c to avoid polypharmacy. Stimulant caused restlessness. Previously referred to [facility] Although he continues to report occasional down mood and anxiety, he denies significant concern, and was able to start therapy.  Although it was previously recommended to reduce the dose of quetiapine , he chose to discontinue the medication on his own. He later restarted it, believing it was helpful for managing his depression and insomnia.  Although this medication could be still attributable to restless leg given his previous report, will maintain on the current dose given he reports benefit from this medication, and its benefit for depression, anxiety, appetite, insomnia.  Will continue current dose of bupropion  to target depression, along with sertraline  to target depression and anxiety.  Will continue clonazepam  as needed for anxiety.   3. Insomnia, unspecified type 3. Fatigue, unspecified type 4. Obstructive sleep apnea # vitamin D  deficiency - According to the recent sleep study, he has severe obstructive sleep apnea, AHI of 31.  - Ritalin  5 mg did not make any difference, but cause adverse reaction of restlessness.  Overall improving.  Will continue quetiapine  to target insomnia, and was medication as outlined above.   4. Restless leg syndrome He reports overall improvement since being on gabapentin , prescribed by the other provider.  Discussed potential risk of respiratory suppression, drowsiness especially concomitant use of clonazepam , opioid.   # benzodiazepine dependence (prescribed) - tapered down from 1 mg TID since April 2023, which was uptitrated back Feb 2025 No change. Although he was adjusting well to tapering down medication 2 years ago, his anxiety has  worsened in the setting of cancer diagnosis.  He reports good benefit since uptitration without any side effects of drowsiness, oversedation or fall.  Both the patient and his wife is aware of potential risk of respiratory suppression with concomitant use of opioid.     5. Cognitive deficits Functional Status   IADL: Independent in the following:driving (advised to refrain from driving [id number])           Requires assistance with the following:  managing finances, medications (using pill box, his wife checks) ADL  Independent in the following: bathing and hygiene, feeding, continence, grooming and toileting, walking          Requires assistance with the following: Folate, Vitamin B12, TSH- low folic acid  08/2022. Otherwise wnl Images Brain MRI 08/2021: There is no evidence of an acute infarct, intracranial hemorrhage, mass, midline shift, or extra-axial fluid collection.Small T2 hyperintensities in the cerebral white matter bilaterally are nonspecific but compatible with mild chronic small vessel ischemic disease. Dilated perivascular spaces are noted in the basal ganglia bilaterally.  There is mild generalized cerebral atrophy. Neuropsych assessment:  Etiology: parkinson, r/o VaD   He denies any subjective symptoms of memory loss.  However, his IADL is somewhat limited. Will continue to assess this.   Plan  Continue sertraline  150 mg at night Continue bupropion  300 mg daily - reduced 10/2023  Continue trazodone  100 mg at night as needed for insomnia Continue quetiapine  50 mg at night  Continue clonazepam  1 mg three times a day Next appointment-  8/21 at 11:30, IP They are advised to contact the sleep clinic regarding the result - on gabapentin  100 mg at night - TSH- 4.238 10/2022 -  He agrees that this Clinical research associate communicates with his provider, [facility], Fonda SAUNDERS, NP    Past trials of medication (the following medication caused either side effect, or limited benefit): lexapro, duloxetine ,  venlafaxine, mirtazapine (rash), Abilify , quetiapine  (insomnia, RLS)   The patient demonstrates the following risk factors for suicide: Chronic risk factors for suicide include: psychiatric disorder of depression and chronic pain. Acute risk factors for suicide include: unemployment. Protective factors for this patient include: positive social support, coping skills and hope for the future. Considering these factors, the overall suicide risk at this point appears to be low. Patient is appropriate for outpatient follow up.  Collaboration of Care: Collaboration of Care: Other reviewed notes in Epic  Patient/Guardian was advised Release of Information must be obtained prior to any record release in order to collaborate their care with an outside provider. Patient/Guardian was advised if they have not already done so to contact the registration department to sign all necessary forms in order for us  to release information regarding their care.   Consent: Patient/Guardian gives verbal consent for treatment and assignment of benefits for services provided during this visit. Patient/Guardian expressed understanding and agreed to proceed.    Katheren Sleet, MD 11/22/2023, 12:31 PM

## 2023-11-19 ENCOUNTER — Inpatient Hospital Stay

## 2023-11-19 NOTE — Progress Notes (Signed)
 Nutrition Follow-up:  Patient with stage I SCC of oropharynx, p 16+.  Patient has completed chemotherapy and radiation.  Has PEG tube  Spoke with wife, [name] via phone for nutrition follow-up.  She reports that his appetite is better and he is eating 3 meals a day plus snacks in between.  Still has some pain with swallowing and taste of foods is an issue.  Will use PEG tube for supplemental nutrition (every few days) but not daily.  Wife is flushing tube daily with water  when not using.  On the days he uses feeding tube will only use 1 carton.  Wife reports bowel movement with senokot BID.  No nausea recently.  Having trouble sleeping at night and MD aware.    PET scan planned for 7/16  Medications: reviewed  Labs: reviewed from 7/1  Anthropometrics:   Weight 150 lb on 7/1, increased  146 lb on 6/3 145 lb on 5/27 UBW of 177 lb on 06/24/23   Estimated Energy Needs  Kcals: 1750-2100 Protein: 84-105 g Fluid: 1750-2100 ml  NUTRITION DIAGNOSIS: Inadequate oral intake improving   MALNUTRITION DIAGNOSIS: Severe malnutrition improving   INTERVENTION:  Continue utilizing feeding tube to supplement oral nutrition and promote weight gain Continue high calorie, high protein foods to help improve weight Flush tube with at least 60ml of water  daily if not using.  Wife aware Wife has contact information    MONITORING, EVALUATION, GOAL: weight trends, intake, tube feeding    NEXT VISIT: Wed, August 13 after MD f/u  Sayuri Rhames B. Dasie SOLON, CSO, LDN Registered Dietitian [phone]

## 2023-11-22 ENCOUNTER — Ambulatory Visit: Admitting: Psychiatry

## 2023-11-22 ENCOUNTER — Encounter: Payer: Self-pay | Admitting: Psychiatry

## 2023-11-22 ENCOUNTER — Other Ambulatory Visit: Payer: Self-pay

## 2023-11-22 VITALS — BP 110/73 | HR 83 | Temp 98.1°F | Ht 69.0 in | Wt 152.4 lb

## 2023-11-22 DIAGNOSIS — F411 Generalized anxiety disorder: Secondary | ICD-10-CM | POA: Diagnosis not present

## 2023-11-22 DIAGNOSIS — F330 Major depressive disorder, recurrent, mild: Secondary | ICD-10-CM

## 2023-11-22 DIAGNOSIS — G4700 Insomnia, unspecified: Secondary | ICD-10-CM | POA: Diagnosis not present

## 2023-11-22 DIAGNOSIS — G2581 Restless legs syndrome: Secondary | ICD-10-CM

## 2023-11-22 MED ORDER — CLONAZEPAM 1 MG PO TABS: 1.0000 mg | ORAL_TABLET | Freq: Three times a day (TID) | ORAL | 0 refills | Status: DC | PRN

## 2023-11-22 NOTE — Patient Instructions (Signed)
 Continue sertraline  150 mg at night  Continue bupropion  300 mg daily  Continue trazodone  100 mg at night as needed for insomnia Continue quetiapine  50 mg at night  Continue clonazepam  1 mg three times a day Next appointment-  8/21 at 11:30

## 2023-11-23 ENCOUNTER — Encounter: Payer: Self-pay | Admitting: Hospice and Palliative Medicine

## 2023-11-23 ENCOUNTER — Ambulatory Visit
Admission: RE | Admit: 2023-11-23 | Discharge: 2023-11-23 | Disposition: A | Discharge status: Home / Self Care | Source: Ambulatory Visit | Attending: Oncology | Admitting: Oncology

## 2023-11-23 DIAGNOSIS — I2510 Atherosclerotic heart disease of native coronary artery without angina pectoris: Secondary | ICD-10-CM | POA: Insufficient documentation

## 2023-11-23 DIAGNOSIS — C109 Malignant neoplasm of oropharynx, unspecified: Secondary | ICD-10-CM | POA: Diagnosis present

## 2023-11-23 DIAGNOSIS — K8689 Other specified diseases of pancreas: Secondary | ICD-10-CM | POA: Insufficient documentation

## 2023-11-23 DIAGNOSIS — I700 Atherosclerosis of aorta: Secondary | ICD-10-CM | POA: Diagnosis not present

## 2023-11-23 LAB — GLUCOSE, CAPILLARY: Glucose-Capillary: 111 mg/dL — ABNORMAL HIGH (ref 70–99)

## 2023-11-23 MED ORDER — FLUDEOXYGLUCOSE F - 18 (FDG) INJECTION
8.4800 | Freq: Once | INTRAVENOUS | Status: AC | PRN
  Administered 2023-11-23: 8.48 via INTRAVENOUS

## 2023-11-23 NOTE — Telephone Encounter (Signed)
 Spoke to [facility] in radiology; will put note to have PET read asap.  My chart message sent to patient.

## 2023-11-24 ENCOUNTER — Encounter: Payer: Self-pay | Admitting: Surgery

## 2023-11-24 ENCOUNTER — Telehealth: Payer: Self-pay | Admitting: *Deleted

## 2023-11-24 ENCOUNTER — Encounter: Payer: Self-pay | Admitting: *Deleted

## 2023-11-24 ENCOUNTER — Encounter: Payer: Self-pay | Admitting: Oncology

## 2023-11-24 ENCOUNTER — Ambulatory Visit (INDEPENDENT_AMBULATORY_CARE_PROVIDER_SITE_OTHER): Admitting: Surgery

## 2023-11-24 VITALS — BP 125/77 | HR 89 | Temp 98.3°F | Ht 69.0 in | Wt 153.2 lb

## 2023-11-24 DIAGNOSIS — E638 Other specified nutritional deficiencies: Secondary | ICD-10-CM

## 2023-11-24 DIAGNOSIS — R1310 Dysphagia, unspecified: Secondary | ICD-10-CM

## 2023-11-24 DIAGNOSIS — C109 Malignant neoplasm of oropharynx, unspecified: Secondary | ICD-10-CM

## 2023-11-24 NOTE — Telephone Encounter (Signed)
 I spoke with her. Plan for in clinic visit later this week.

## 2023-11-24 NOTE — Telephone Encounter (Signed)
 Patient scheduled accordingly.  No further follow up needed at this time.

## 2023-11-24 NOTE — Patient Instructions (Addendum)
 How to Care for a Feeding Tube A feeding tube is a soft, flexible tube used to give medicine, water, and liquid food. A person may need a feeding tube if they have trouble swallowing or cannot have food or medicine by mouth. The tube is put right into the stomach.  The following information gives steps on how to care for the skin around a feeding tube, or the tube site. This information is meant for adults and children over 66 year of age. Supplies needed: Clean washcloth, gauze pads, or soft paper towels. Cotton swabs. Skin barrier ointment or cream, such as petroleum jelly. Soap and water, or sterile saline. Pre-cut foam pads or gauze for around the tube. Medical tape. Anchoring device. This is not always used. Syringe. Cleaning brush or toothbrush. This is only used for cleanings. How to care for the tube site  Have all supplies ready and near you. Wash your hands with soap and water for at least 20 seconds. Remove the foam pad or gauze under the tube stabilizing disc (bumper), if there is one. You may have to do this a few times a day right after the feeding tube is put in because the gauze or pad will get soiled or wet. As the site heals, you may not have to replace the pads or gauze as often. But you still need to clean and check the area every day. Gently turn the bumper so it does not stick to the skin. Check the skin around the tube site for redness, rash, swelling, drainage, or growth of extra tissue. Check the number on the tube (guide mark) where it meets the skin. It should not change. If it does, the feeding tube might be coming out. Moisten gauze pads and cotton swabs with water and soap, or saline. Take the moistenedcotton swab and wipe under the bumper, right near the opening in the belly (stoma). Take the moistened gauze pad and clean the skin around the tube site. If you used soap, rinse with water. Use a washcloth, dry gauze pad, or soft paper towel to dry the skin and  stoma site. Dry the tube and bumper too. If the skin is red, put a barrier cream or ointment on a cotton swab. Apply it around the site, under the bumper. This will help the site heal. Put a new pre-cut foam pad or gauze around the tube, under the bumper. If the site is healed and there is no drainage, you can leave off the foam pads or gauze. Put tape around the edges of the foam pad or gauze to keep it in place. Use tape or an anchoring device to hold the loose end of the tube against the skin. This helps keep the tube from getting pulled on. Change where you put the tape to avoid damaging the skin. Throw away used supplies. Wash your hands with soap and water for at least 20 seconds. How to care for the moat If the end of the feeding tube has a moat, clean it once a day or as told. The moat is the open space inside the feeding tube connector. Follow the manufacturer's instructions on how to clean the moat. You may be told to: Remove the cap from the end of the feeding tube. Cover the hole in the middle of the tube port with a cleaning brush. Hold the end of the tube over a deep bowl, or wrap it with paper towels or a washcloth to soak up the water. Use  a syringe of water to flush out the moat. Use the cleaning brush or toothbrush to clean the tube cap and around the inside of the moat. This brush can only be used for tube cleanings. Dry the end of the feeding tube and the cap with gauze or paper towel. Put the cap back on the feeding tube. General tips Use, clean, and reuse feeding tube equipment only as told by the health care provider. Do not use antibiotic ointment or cream on the feeding tube site unless you're told to. Contact a health care provider if: You notice a change in the guide marks on the feeding tube where it meets the skin. Changes could mean the feeding tube has moved or is coming out. You see any of these on the skin around the tube  site: Redness. Rash. Swelling. Drainage. Extra growth of tissue. You have questions or concerns about the feeding tube or the tube site. This information is not intended to replace advice given to you by your health care provider. Make sure you discuss any questions you have with your health care provider. Document Revised: 08/27/2022 Document Reviewed: 08/27/2022 Elsevier Patient Education  2024 [employer].

## 2023-11-24 NOTE — Telephone Encounter (Signed)
 Per Dr. Babara PET showed Complete metabolic response, good new. There is esophagitis. Please offer him [facility] for further eval and management.  Outbound call, spoke to spouse [name] informed of above.  Spouse states patient has been feeling bad all over for the past week.  Spouse requested and agreed to an appointment on Friday 7/18 at 10:45am.  Message sent to scheduling to secure appointment.  Spouse had other questions regarding the scan results; informed that can be further discussed at upcoming appointment.  Spouse verbalized understanding.

## 2023-11-24 NOTE — Telephone Encounter (Signed)
 The patient's wife called in and wanted to have Ricky [name] give a call to her about the results of the PET scan

## 2023-11-25 NOTE — Progress Notes (Signed)
 Outpatient Surgical Follow Up    Ricky [name] is an 68 y.o. male.   Chief Complaint  Patient presents with   Routine Post Op    Gastrostomy tube 09/30/23    HPI: Patient is 68 year old male seen for gastrostomy tube follow-up.  No fevers or chills.  He has completed chemotherapy and radiation and has a recent PET/CT done approximately.  Discussed with patient.  At the request.  There is no evidence of any further disease. He is still using the PEG tube with tube feeds.  He has gained some weight.  He still has some dysphagia, they had questions regarding PET and my thoughts regarding nutritional support.   Past Medical History:  Diagnosis Date   Anemia    Anxiety    Arthritis    Carpal tunnel syndrome    Depression    Diabetes (HCC)    Hyperlipidemia    Hypertension    Loss of teeth due to extraction    Neoplasm related pain    Parkinson's disease [facility])     Past Surgical History:  Procedure Laterality Date   CARPAL TUNNEL RELEASE Right 11/28/2020   Procedure: CARPAL TUNNEL RELEASE ENDOSCOPIC;  Surgeon: Edie Norleen PARAS, MD;  Location: [facility] ORS;  Service: Orthopedics;  Laterality: Right;   CARPAL TUNNEL RELEASE Left 01/22/2021   Procedure: CARPAL TUNNEL RELEASE ENDOSCOPIC;  Surgeon: Edie Norleen PARAS, MD;  Location: [facility] ORS;  Service: Orthopedics;  Laterality: Left;   INSERTION, GASTROSTOMY TUBE, ROBOT-ASSISTED N/A 09/30/2023   Procedure: INSERTION, GASTROSTOMY TUBE, ROBOT-ASSISTED;  Surgeon: Jordis Laneta FALCON, MD;  Location: [facility] ORS;  Service: General;  Laterality: N/A;   IR IMAGING GUIDED PORT INSERTION  06/30/2023   KNEE ARTHROSCOPY Right    KNEE ARTHROSCOPY Left    MANDIBLE FRACTURE SURGERY     as teenager    Family History  Problem Relation Age of Onset   Diabetes Mother    Hypertension Mother    CVA Father    Heart attack Father    Depression Sister    Depression Brother     Social History:  reports that he has never smoked. He has never been exposed to tobacco  smoke. He has never used smokeless tobacco. He reports that he does not currently use alcohol. He reports that he does not use drugs.  Allergies:  Allergies  Allergen Reactions   Mirtazapine Rash   Other Itching and Rash    ETCO2 tubing. Med[employer]onic Microstream advance    Medications reviewed.    ROS Full ROS performed and is otherwise negative other than what is stated in HPI   BP 125/77   Pulse 89   Temp 98.3 F (36.8 C) (Oral)   Ht 5' 9 (1.753 m)   Wt 153 lb 3.2 oz (69.5 kg)   SpO2 97%   BMI 22.62 kg/m   Physical Exam Vitals and nursing note reviewed. Exam conducted with a chaperone present.  Constitutional:      Appearance: Normal appearance.  Cardiovascular:     Rate and Rhythm: Normal rate and regular rhythm.     Heart sounds: No murmur heard. Abdominal:     General: Abdomen is flat. There is no distension.     Palpations: Abdomen is soft. There is no mass.     Tenderness: There is no abdominal tenderness. There is no guarding or rebound.     Hernia: No hernia is present.     Comments: G tube in place w/o infection or issues  Skin:    Capillary Refill: Capillary refill takes less than 2 seconds.  Neurological:     General: No focal deficit present.     Mental Status: He is alert and oriented to person, place, and time.  Psychiatric:        Mood and Affect: Mood normal.        Behavior: Behavior normal.        Thought Content: Thought content normal.        Judgment: Judgment normal.     Results for orders placed or performed during the hospital encounter of 11/23/23 (from the past 48 hours)  Glucose, capillary     Status: Abnormal   Collection Time: 11/23/23  8:22 AM  Result Value Ref Range   Glucose-Capillary 111 (H) 70 - 99 mg/dL    Comment: Glucose reference range applies only to samples taken after fasting for at least 8 hours.   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 Gastrostomy tube insertion in February. EXAM: NUCLEAR MEDICINE PET SKULL BASE TO THIGH TECHNIQUE: 8.5 mCi F-18 FDG was injected intravenously. Full-ring PET imaging was performed from the skull base to thigh after the radiotracer. CT data was obtained and used for attenuation correction and anatomic localization. Fasting blood glucose: 111 mg/dl COMPARISON:  97/78/7974 FINDINGS: Mediastinal blood pool activity: SUV max 2.3 Liver activity: SUV max NA NECK: Resolution of previously described right-sided oropharyngeal mass. The right-sided level 2 cervical nodal hypermetabolism has resolved. Incidental CT findings: Cerebral atrophy.  No cervical adenopathy. CHEST: No pulmonary parenchymal or thoracic nodal hypermetabolism. Incidental CT findings: Right Port-A-Cath tip high right atrium. Mild cardiomegaly. Aortic and coronary artery calcification. Mild distal esophageal wall thickening including on 77/6 suggests esophagitis. ABDOMEN/PELVIS: No abdominopelvic parenchymal or nodal hypermetabolism. Incidental CT findings: Caudate and lateral segment left liver lobe enlargement. Mild splenomegaly including 13.2 cm greatest transverse dimension. Gastrostomy tube. Normal adrenal glands. Abdominal aortic atherosclerosis. Subtle pancreatic parenchymal calcifications favored over vascular etiology. SKELETON: No abnormal marrow activity. Incidental CT findings: None. IMPRESSION: 1. Complete metabolic response of oropharyngeal primary and ipsilateral nodal metastasis. 2. Distal esophageal wall thickening suggests esophagitis. 3. Hepatic morphology is suspicious for mild cirrhosis, especially given the EMR history of alcohol use disorder in remission. Similarly, subtle pancreatic calcifications are favored to be parenchymal and represent alcohol related chronic calcific pancreatitis. 4. Coronary artery atherosclerosis. Aortic Atherosclerosis (ICD10-I70.0). Electronically Signed    By: Rockey Kilts M.D.   On: 11/23/2023 15:43    Assessment/Plan: 68 year old male with squamous cell of the head and neck responded to radiation and chemotherapy.  He does have some mild dysphagia.  I recommended against removing any gastrostomy tube.  Discussed with him extensively about his fragility.  Fortunately this is reassuring.  I encouraged them to speak to oncology regarding next steps.  I will be happy to see him back in about 6 weeks and see how he is doing from a nutritional perspective.  No evidence of complications. Please note that during today's visit was not related to any issues related to his gastrostomy tube.  We talked about nutritional support during his squamous cell carcinoma.  We also talked about his recent PET/CT and recs regarding dysphagia. This evaluation and management service was performed during the postoperative period of an unrelated procedure I personally spent a total of 40 minutes in the care of the patient today including performing a  medically appropriate exam/evaluation, counseling and educating, placing orders, referring and communicating with other health care professionals, documenting clinical information in the EHR, independently interpreting and reviewing images studies and coordinating care.   Laneta Luna, MD [facility] General Surgeon

## 2023-11-26 ENCOUNTER — Inpatient Hospital Stay (HOSPITAL_BASED_OUTPATIENT_CLINIC_OR_DEPARTMENT_OTHER): Admitting: Hospice and Palliative Medicine

## 2023-11-26 ENCOUNTER — Encounter: Payer: Self-pay | Admitting: Hospice and Palliative Medicine

## 2023-11-26 VITALS — BP 110/72 | HR 81 | Temp 97.0°F | Resp 16 | Wt 152.0 lb

## 2023-11-26 DIAGNOSIS — G893 Neoplasm related pain (acute) (chronic): Secondary | ICD-10-CM

## 2023-11-26 DIAGNOSIS — C109 Malignant neoplasm of oropharynx, unspecified: Secondary | ICD-10-CM | POA: Diagnosis not present

## 2023-11-26 MED ORDER — OXYCODONE HCL 10 MG PO TABS: 10.0000 mg | ORAL_TABLET | ORAL | 0 refills | Status: AC | PRN

## 2023-11-26 MED ORDER — SUCRALFATE 1 G PO TABS: 1.0000 g | ORAL_TABLET | Freq: Three times a day (TID) | ORAL | 1 refills | Status: AC

## 2023-11-26 MED ORDER — GABAPENTIN 300 MG PO CAPS: 300.0000 mg | ORAL_CAPSULE | Freq: Every day | ORAL | 3 refills | Status: DC

## 2023-11-26 MED ORDER — PANTOPRAZOLE SODIUM 40 MG PO TBEC: 40.0000 mg | DELAYED_RELEASE_TABLET | Freq: Every day | ORAL | 3 refills | Status: DC

## 2023-11-26 NOTE — Progress Notes (Signed)
 Symptom Management Clinic [facility] at [facility] Telephone:([phone] [phone]) [phone]  Patient Care Team: Fernande Ophelia JINNY DOUGLAS, MD as PCP - General (Internal Medicine) Melanee Annah BROCKS, MD as Consulting Physician (Oncology)   NAME OF PATIENT: Ricky [name]  [id number]  23-Dec-1955   DATE OF VISIT: 11/26/23  REASON FOR CONSULT: Deaunte Dente is a 68 y.o. 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   INTERVAL HISTORY: Patient saw Dr. Melanee on 09/14/2023.  Patient is status post concurrent chemoradiation.  He completed cycle 7 cisplatin  on 09/14/2023.  Patient with persistent poor oral intake and weight loss.  Underwent PEG placement on 09/30/2023.    Patient presents [facility] today with complaint of sore throat.  Patient endorses odynophagia.  This is limiting his ability to eat and drink recently.  He is still utilizing his PEG as needed.  Patient has had reflux symptoms and burning sensation in his esophagus and stomach.  Denies fever or chills.  No oral lesions reported.  Patient says that the gabapentin  has helped his restless legs but he feels he is underdosed.  He is taking 2 capsules with better improvement in symptoms.  Patient denies nausea, vomiting, diarrhea or constipation.  Denies pain.  No urinary complaints. Denies respiratory complaints or chest pain.  Patient offers no further specific complaints today.  PAST MEDICAL HISTORY: Past Medical History:  Diagnosis Date   Anemia    Anxiety    Arthritis    Carpal tunnel syndrome    Depression    Diabetes (HCC)    Hyperlipidemia    Hypertension    Loss of teeth due to extraction    Neoplasm related pain    Parkinson's disease (HCC)     PAST SURGICAL HISTORY:  Past Surgical History:  Procedure Laterality Date   CARPAL TUNNEL RELEASE Right 11/28/2020   Procedure: CARPAL TUNNEL RELEASE ENDOSCOPIC;  Surgeon: Edie Norleen JINNY, MD;  Location: [facility] ORS;  Service: Orthopedics;  Laterality: Right;   CARPAL TUNNEL RELEASE Left 01/22/2021   Procedure: CARPAL TUNNEL RELEASE ENDOSCOPIC;  Surgeon: Edie Norleen JINNY, MD;  Location: [facility] ORS;  Service: Orthopedics;  Laterality: Left;   INSERTION, GASTROSTOMY TUBE, ROBOT-ASSISTED N/A 09/30/2023   Procedure: INSERTION, GASTROSTOMY TUBE, ROBOT-ASSISTED;  Surgeon: Jordis Laneta FALCON, MD;  Location: [facility] ORS;  Service: General;  Laterality: N/A;   IR IMAGING GUIDED PORT INSERTION  06/30/2023   KNEE ARTHROSCOPY Right    KNEE ARTHROSCOPY Left    MANDIBLE FRACTURE SURGERY     as teenager    HEMATOLOGY/ONCOLOGY HISTORY:  Oncology History  Squamous cell carcinoma of oropharynx (HCC)  06/22/2023 Initial Diagnosis   Squamous cell carcinoma of oropharynx (HCC)   06/22/2023 Cancer Staging   Staging form: Pharynx - HPV-Mediated Oropharynx, AJCC 8th Edition - Clinical stage from 06/22/2023: Stage I (cT2, cN1, cM0, p16+) - Signed by Melanee Annah BROCKS, MD on 07/12/2023 Stage prefix: Initial diagnosis   07/12/2023 -  Chemotherapy   Patient is on Treatment Plan : HEAD/NECK Cisplatin  (40) q7d       ALLERGIES:  is allergic to mirtazapine and other.  MEDICATIONS:  Current Outpatient Medications  Medication Sig Dispense Refill   ACCU-CHEK GUIDE TEST test strip TEST 2X DAILY     Accu-Chek Softclix Lancets lancets 2 (two) times daily.     Blood Glucose Monitoring Suppl (ACCU-CHEK GUIDE ME) w/Device KIT See admin instructions.     buPROPion  (WELLBUTRIN  XL)  300 MG 24 hr tablet Take 1 tablet (300 mg total) by mouth daily. Total of 450 mg daily. Take along with 150 mg tab 30 tablet 5   calcium  carbonate (CALCIUM  600) 600 MG TABS tablet Take 1 tablet (600 mg total) by mouth daily. For low calcium  60 tablet 0   calcium  carbonate (TUMS) 500 MG chewable tablet Chew 500 mg by mouth daily.     carbidopa -levodopa  (SINEMET  IR) 25-100 MG tablet Take 2-3 tablets by mouth See admin instructions. Take 3 tablets in  morning 2 tablet at bedtime (Patient taking differently: Take 1 tablet by mouth in the morning and at bedtime.)     [START ON 12/11/2023] clonazePAM  (KLONOPIN ) 1 MG tablet Take 1 tablet (1 mg total) by mouth 3 (three) times daily as needed for anxiety. 90 tablet 0   cyanocobalamin  (VITAMIN B12) 1000 MCG tablet Take 1,000 mcg by mouth daily.     fentaNYL  (DURAGESIC ) 12 MCG/HR Place 1 patch onto the skin every 3 (three) days. 10 patch 0   folic acid  (FOLVITE ) 1 MG tablet Take 1 tablet (1 mg total) by mouth daily. 30 tablet 3   gabapentin  (NEURONTIN ) 100 MG capsule Take 1 capsule (100 mg total) by mouth at bedtime. 30 capsule 0   ketoconazole (NIZORAL) 2 % shampoo Apply 1 application  topically every other day.     lidocaine -prilocaine  (EMLA ) cream Apply to affected area once 30 g 3   magnesium  chloride (SLOW-MAG) 64 MG TBEC SR tablet Take 2 tablets (128 mg total) by mouth 2 (two) times daily. For low magnesium  120 tablet 00   metFORMIN  (GLUCOPHAGE ) 500 MG tablet Take 500 mg by mouth daily with breakfast.     Multiple Vitamin (MULTI-VITAMIN) tablet Take 1 tablet by mouth daily.     Nutritional Supplements (NUTREN 1.5) LIQD Give 1 carton in feeding tube via bolus syringe 5 times a day.  Flush with 60cc of water  before and 120cc of water  after.     ondansetron  (ZOFRAN ) 8 MG tablet Take 1 tablet (8 mg total) by mouth every 8 (eight) hours as needed for nausea or vomiting. Start on the third day after cisplatin . 30 tablet 1   Oxycodone  HCl 10 MG TABS Take 1 tablet (10 mg total) by mouth every 4 (four) hours as needed. 60 tablet 0   polyethylene glycol (MIRALAX / GLYCOLAX) 17 g packet Take 17 g by mouth daily.     prochlorperazine  (COMPAZINE ) 10 MG tablet Take 1 tablet (10 mg total) by mouth every 6 (six) hours as needed for nausea or vomiting. 30 tablet 1   rosuvastatin  (CRESTOR ) 20 MG tablet Take 20 mg by mouth daily.     senna (SENOKOT) 8.6 MG tablet Take 1 tablet by mouth 2 (two) times daily.      sertraline  (ZOLOFT ) 100 MG tablet Take 1.5 tablets (150 mg total) by mouth at bedtime. 45 tablet 3   sucralfate  (CARAFATE ) 1 g tablet Take 1 tablet (1 g total) by mouth 3 (three) times daily before meals. Dissolve in 4 tbs of warm water , swish and swallow 90 tablet 1   tadalafil (CIALIS) 5 MG tablet Take 5 mg by mouth daily.     tamsulosin (FLOMAX) 0.4 MG CAPS capsule Take 0.4 mg by mouth daily.     traZODone  (DESYREL ) 50 MG tablet Take 0.5-2 tablets (25-100 mg total) by mouth at bedtime as needed for sleep. 60 tablet 3   atenolol (TENORMIN) 50 MG tablet Take 50 mg by mouth daily. (  Patient not taking: Reported on 11/26/2023)     No current facility-administered medications for this visit.    VITAL SIGNS: BP 110/72 (BP Location: Left Arm, Patient Position: Sitting, Cuff Size: Normal)   Pulse 81   Temp (!) 97 F (36.1 C) (Tympanic)   Resp 16   Wt 152 lb (68.9 kg)   SpO2 99%   BMI 22.45 kg/m  Filed Weights   11/26/23 1057  Weight: 152 lb (68.9 kg)     Estimated body mass index is 22.45 kg/m as calculated from the following:   Height as of 11/24/23: 5' 9 (1.753 m).   Weight as of this encounter: 152 lb (68.9 kg).  LABS: CBC:    Component Value Date/Time   WBC 3.7 (L) 11/09/2023 0911   WBC 2.0 (L) 09/30/2023 0632   HGB 9.0 (L) 11/09/2023 0911   HCT 26.1 (L) 11/09/2023 0911   HCT 24.4 (L) 09/14/2023 0836   PLT 95 (L) 11/09/2023 0911   MCV 93.5 11/09/2023 0911   NEUTROABS 2.9 11/09/2023 0911   LYMPHSABS 0.4 (L) 11/09/2023 0911   MONOABS 0.2 11/09/2023 0911   EOSABS 0.1 11/09/2023 0911   BASOSABS 0.0 11/09/2023 0911   Comprehensive Metabolic Panel:    Component Value Date/Time   NA 137 11/09/2023 0911   K 3.8 11/09/2023 0911   CL 104 11/09/2023 0911   CO2 26 11/09/2023 0911   BUN 21 11/09/2023 0911   CREATININE 1.13 11/09/2023 0911   GLUCOSE 144 (H) 11/09/2023 0911   CALCIUM  9.2 11/09/2023 0911   AST 21 11/09/2023 0911   ALT 5 11/09/2023 0911   ALKPHOS 41 11/09/2023  0911   BILITOT 0.5 11/09/2023 0911   PROT 6.8 11/09/2023 0911   ALBUMIN 4.2 11/09/2023 0911    RADIOGRAPHIC STUDIES: NM PET Image Restage (PS) Skull Base to Thigh (F-18 FDG) Result Date: 11/23/2023 CLINICAL DATA:  Subsequent treatment strategy for squamous cell carcinoma of oropharynx. Gastrostomy tube insertion in February. EXAM: NUCLEAR MEDICINE PET SKULL BASE TO THIGH TECHNIQUE: 8.5 mCi F-18 FDG was injected intravenously. Full-ring PET imaging was performed from the skull base to thigh after the radiotracer. CT data was obtained and used for attenuation correction and anatomic localization. Fasting blood glucose: 111 mg/dl COMPARISON:  97/78/7974 FINDINGS: Mediastinal blood pool activity: SUV max 2.3 Liver activity: SUV max NA NECK: Resolution of previously described right-sided oropharyngeal mass. The right-sided level 2 cervical nodal hypermetabolism has resolved. Incidental CT findings: Cerebral atrophy.  No cervical adenopathy. CHEST: No pulmonary parenchymal or thoracic nodal hypermetabolism. Incidental CT findings: Right Port-A-Cath tip high right atrium. Mild cardiomegaly. Aortic and coronary artery calcification. Mild distal esophageal wall thickening including on 77/6 suggests esophagitis. ABDOMEN/PELVIS: No abdominopelvic parenchymal or nodal hypermetabolism. Incidental CT findings: Caudate and lateral segment left liver lobe enlargement. Mild splenomegaly including 13.2 cm greatest transverse dimension. Gastrostomy tube. Normal adrenal glands. Abdominal aortic atherosclerosis. Subtle pancreatic parenchymal calcifications favored over vascular etiology. SKELETON: No abnormal marrow activity. Incidental CT findings: None. IMPRESSION: 1. Complete metabolic response of oropharyngeal primary and ipsilateral nodal metastasis. 2. Distal esophageal wall thickening suggests esophagitis. 3. Hepatic morphology is suspicious for mild cirrhosis, especially given the EMR history of alcohol use disorder in  remission. Similarly, subtle pancreatic calcifications are favored to be parenchymal and represent alcohol related chronic calcific pancreatitis. 4. Coronary artery atherosclerosis. Aortic Atherosclerosis (ICD10-I70.0). Electronically Signed   By: Rockey Kilts M.D.   On: 11/23/2023 15:43     PERFORMANCE STATUS (ECOG) : 1 - Symptomatic but  completely ambulatory  Review of Systems Unless otherwise noted, a complete review of systems is negative.  Physical Exam General: NAD Cardiovascular: regular rate and rhythm Pulmonary: clear ant fields Abdomen: soft, nontender, + bowel sounds, PEG GU: no suprapubic tenderness Extremities: no edema, no joint deformities Skin: no rashes Neurological: Weakness but otherwise nonfocal  IMPRESSION/PLAN: Squamous cell carcinoma of the head neck -s/p concurrent cisplatin /XRT.  PET scan showed complete metabolic response of oropharyngeal primary and ipsilateral nodal metastasis.  Dysphagia/poor oral intake-patient status post PEG.  Continue high-protein/high-calorie foods and oral nutritional supplements.  Weight is stable.  Esophagitis-PET scan suggestive of esophagitis.  Patient does endorse reflux symptoms.  Will treat for reflux esophagitis with pantoprazole and sucralfate .   RLS -will increase gabapentin  to 300 mg nightly  Anxiety/Depression -follow-up with psychiatry  Neoplasm related pain -refill oxycodone .  Discussed future weaning as tolerated.  Patient expressed understanding and was in agreement with this plan. He also understands that He can call clinic at any time with any questions, concerns, or complaints.   Thank you for allowing me to participate in the care of this very pleasant patient.   Time Total: 15 minutes  Visit consisted of counseling and education dealing with the complex and emotionally intense issues of symptom management in the setting of serious illness.Greater than 50%  of this time was spent counseling and coordinating  care related to the above assessment and plan.  Signed by: Fonda Mower, PhD, NP-C

## 2023-11-29 ENCOUNTER — Ambulatory Visit: Admitting: Licensed Clinical Social Worker

## 2023-11-29 DIAGNOSIS — F330 Major depressive disorder, recurrent, mild: Secondary | ICD-10-CM

## 2023-11-29 DIAGNOSIS — F411 Generalized anxiety disorder: Secondary | ICD-10-CM

## 2023-11-29 DIAGNOSIS — G4700 Insomnia, unspecified: Secondary | ICD-10-CM

## 2023-11-29 NOTE — Progress Notes (Signed)
 THERAPIST PROGRESS NOTE  Session Time: 11:02am-12pm  Participation Level: Active  Behavioral Response: CasualAlertAnxious  Type of Therapy: Individual Therapy  Treatment Goals addressed:  Active     Anxiety     LTG: Report a decrease in anxiety symptoms as evidenced by an overall reduction in anxiety score by a minimum of 25% on the Generalized Anxiety Disorder Scale (GAD-7)     Start:  09/08/23    Expected End:  03/09/24         STG - Misc 1     Start:  09/08/23    Expected End:  03/09/24      Identify and Challenge Three Negative Thoughts: Become aware of distorted or unhelpful thinking patterns and replace them with more balanced and constructive thoughts.      Perform psychoeducation regarding anxiety disorders     Start:  09/08/23         Work with patient individually to identify the major components of a recent episode of anxiety: physical symptoms, major thoughts and images, and major behaviors they experienced     Start:  09/08/23         Coping Skills     Start:  09/08/23       Will work with the pt using CBT/DBT techniques to help the pt verbalize an understanding of the cognitive, physiological, and behavioral components of anxiety and its treatment. This will be done by using worksheets, interactive activities, CBT/ABC thought logs, modeling, homework, role playing and journaling. Will work with pt to learn and implement coping skills that result in a reduction of anxiety and improve daily functioning per pt self-report 3 out of 5 documented sessions.         OP Depression     LTG: Reduce frequency, intensity, and duration of depression symptoms so that daily functioning is improved     Start:  09/08/23    Expected End:  03/09/24         LTG: Increase coping skills to manage depression and improve ability to perform daily activities     Start:  09/08/23    Expected End:  03/09/24         STG: Ricky [name] will identify cognitive patterns  and beliefs that support depression     Start:  09/08/23    Expected End:  03/09/24         LTG: To be normal [people being happy, getting around, seeing my son again, communicating with others] like everybody else.     Start:  09/08/23    Expected End:  03/09/24         Work with Ricky [name] to identify the major components of a recent episode of depression: physical symptoms, major thoughts and images, and major behaviors they experienced     Start:  09/08/23         Coping Skills      Start:  09/08/23       Will work with the pt using CBT/DBT techniques to help the pt verbalize an understanding of the cognitive, physiological, and behavioral components of depression and its treatment. This will be done by using worksheets, interactive activities, CBT/ABC thought logs, modeling, homework, role playing and journaling. Will work with pt to learn and implement coping skills that result in a reduction of depression and improve daily functioning per pt self-report 3 out of 5 documented sessions.          ProgressTowards Goals: Progressing  Interventions: CBT, Supportive,  Reframing, and Other: Cognitive Defusion  Summary: Ricky [name] is a 68 y.o. male who presents with lack of motivation, anhedonia, feelings of anxiety, tension, a negative outlook, and uncontrollable worry. Pt was oriented times 5. Pt was cooperative and engaged. Pt denies SI/HI/AVH.      The patient continues to experience symptoms of anxiety, including uncontrollable worry, somatic symptoms related to anxiety, and difficulty staying asleep.   He reports that he was unable to practice the mindfulness techniques discussed in the previous session due to procrastination. The clinician explored the patient's expectations for therapy, particularly regarding the management of his depression and anxiety. The clinician encouraged the patient to recognize that symptoms of anxiety and depression may always be present to  some extent. For the patient to make progress, he needs to learn and practice coping techniques regularly.  The patient indicates that his anxiety levels remain the same and that he continues to take Klonopin  to manage his anxiety. He mentioned feeling anxious while sitting in the lobby before his therapy session, stating, I can never relax. The patient articulated his expectations, saying, I want to be able to relax and to control his racing thoughts. He expressed that his anxiety medications help him function, and without them, he believes he would struggle to leave the house. The patient reflected on how his anxiety and depression became overwhelming and affected his functioning at the age of 32, but he could not identify a specific triggering event.  The clinician utilized Cognitive Behavioral Therapy (CBT) techniques to assess negative thoughts and worries, including concerns about the return of his cancer. Through Socratic questioning, the clinician helped the patient recognize that he often feels a strong need for control. The patient shared that he enjoyed attending his cancer treatments because they provided positive social interactions, making him feel less alone. However, in his day-to-day life, he stated, I feel alone all the time.   The clinician noted recent social connections that the patient has made, where he was able to remain present and make progress toward relaxation. The patient reflected on a recent outing with his son, as well as stressors related to his relationships with his children. He demonstrated growth in his ability to reflect on his progress, acceptance, and managing his worries. The clinician provided psychoeducation about cognitive defusion techniques and how he can begin to challenge negative thoughts.  For homework, the patient was asked to be mindful of his patterns of worry and the common thoughts that frequently arise.  Suicidal/Homicidal: Nowithout  intent/plan  Therapist Response: Clinician utilized active and supportive reflection to create a safe space for patient to process recent events. Clinician assessed for current symptoms, stressors, seeking support session.  Clinician and patient worked through CBT techniques to be able to identify common anxious thoughts and work towards reframing negative thoughts.  Reviewed and stressed the importance of practicing breathing techniques.  Clinician also began utilizing cognitive defusion techniques to educate patient on ways to cope with anxious thoughts.    Plan: Return again in 4 weeks.  Patient expressed a desire to push out therapeutic sessions to every 4 weeks as a result of feeling overwhelmed by frequency of other doctors appointments.  Diagnosis:MDD (major depressive disorder), recurrent episode, mild (HCC)  GAD (generalized anxiety disorder)  Insomnia, unspecified type    Collaboration of Care: AEB psychiatrist can access notes and cln. Will review psychiatrists' notes. Check in with the patient and will see LCSW per availability. Patient agreed with treatment recommendations.  Patient/Guardian was advised Release of Information must be obtained prior to any record release in order to collaborate their care with an outside provider. Patient/Guardian was advised if they have not already done so to contact the registration department to sign all necessary forms in order for us  to release information regarding their care.   Consent: Patient/Guardian gives verbal consent for treatment and assignment of benefits for services provided during this visit. Patient/Guardian expressed understanding and agreed to proceed.   Evalene KATHEE Husband, LCSW 11/29/2023

## 2023-12-09 ENCOUNTER — Telehealth: Payer: Self-pay

## 2023-12-09 ENCOUNTER — Other Ambulatory Visit: Payer: Self-pay | Admitting: Psychiatry

## 2023-12-09 MED ORDER — QUETIAPINE FUMARATE 50 MG PO TABS: 50.0000 mg | ORAL_TABLET | Freq: Every day | ORAL | 0 refills | Status: DC

## 2023-12-09 NOTE — Telephone Encounter (Signed)
 Ordered

## 2023-12-09 NOTE — Telephone Encounter (Signed)
 pt wife called requesting a refill on the seroquel .  pt was last seen on 7-14 next appt 8-21

## 2023-12-10 ENCOUNTER — Other Ambulatory Visit: Payer: Self-pay | Admitting: Hospice and Palliative Medicine

## 2023-12-10 NOTE — Telephone Encounter (Signed)
 pt spouse was notified that rx had been sent to the pharmacy

## 2023-12-15 ENCOUNTER — Telehealth: Payer: Self-pay | Admitting: Psychiatry

## 2023-12-15 NOTE — Telephone Encounter (Signed)
 I'll route this, since it pertains to a therapy appointment.

## 2023-12-15 NOTE — Telephone Encounter (Signed)
 I am going to call and see what is needed.

## 2023-12-15 NOTE — Telephone Encounter (Signed)
 according to the wife she states that it is for therapist here.

## 2023-12-15 NOTE — Telephone Encounter (Signed)
 called was told that i needed NPI and charge codes. so i had to get that info from provider. onced received info called insurance back. provider is out of network according to insurance. was told that i can get a prior auth but with provider out of network pt would have a higher copay.  I was given another phone number to call to get auth #  [id number] #6.

## 2023-12-15 NOTE — Telephone Encounter (Signed)
 message was sent to provider - K. Perkins and to also supervisor S. Waddell to let them know. since there maybe other calling with same issue

## 2023-12-15 NOTE — Telephone Encounter (Signed)
 pt spouse was called and given all of this information. i did tell her that i was documenting everything in a phone message. and that i would also notify the front desk and also my supervisor. she was advised that if she received a bill to please call the billing department.   She agreed. She stated that she was also going to make note of it also.

## 2023-12-15 NOTE — Telephone Encounter (Signed)
 when i called insurance they are asking about service codes and i don't have that info. so i will have to find someone who can give me that info.

## 2023-12-15 NOTE — Telephone Encounter (Signed)
 Could you ask what therapy are they referring to? If this refers to psychotherapy, we typically do not provide that service, so clarification would be helpful.

## 2023-12-15 NOTE — Telephone Encounter (Signed)
 called [phone] #[phone] info for provider was given NPI and charge code. spoke with Gillian P she states that provider is in network and that with the codes provided that as long as it is not for substance abuse than it was a covered. i asked her why when I called the [phone] that she told me that provider was out of network. And that is when she gave me this # [phone] #6. She stated that she did not know that when she pulled it up that it shows that the provider is in-network.  The codes used as long as they not for substance use then it is a covered benefit and that a prior auth was not needed.

## 2023-12-16 ENCOUNTER — Ambulatory Visit: Admitting: Licensed Clinical Social Worker

## 2023-12-16 ENCOUNTER — Encounter: Payer: Self-pay | Admitting: Oncology

## 2023-12-16 NOTE — Telephone Encounter (Signed)
Is this to be renewed????

## 2023-12-22 ENCOUNTER — Inpatient Hospital Stay

## 2023-12-22 ENCOUNTER — Inpatient Hospital Stay: Attending: Oncology | Admitting: Oncology

## 2023-12-22 ENCOUNTER — Encounter: Payer: Self-pay | Admitting: Oncology

## 2023-12-22 VITALS — BP 114/76 | HR 78 | Temp 97.3°F | Resp 18 | Ht 69.0 in | Wt 160.3 lb

## 2023-12-22 DIAGNOSIS — Z931 Gastrostomy status: Secondary | ICD-10-CM | POA: Insufficient documentation

## 2023-12-22 DIAGNOSIS — C109 Malignant neoplasm of oropharynx, unspecified: Secondary | ICD-10-CM

## 2023-12-22 DIAGNOSIS — D649 Anemia, unspecified: Secondary | ICD-10-CM | POA: Diagnosis not present

## 2023-12-22 DIAGNOSIS — D72819 Decreased white blood cell count, unspecified: Secondary | ICD-10-CM | POA: Diagnosis not present

## 2023-12-22 DIAGNOSIS — D696 Thrombocytopenia, unspecified: Secondary | ICD-10-CM | POA: Diagnosis not present

## 2023-12-22 DIAGNOSIS — Z85818 Personal history of malignant neoplasm of other sites of lip, oral cavity, and pharynx: Secondary | ICD-10-CM | POA: Insufficient documentation

## 2023-12-22 DIAGNOSIS — E785 Hyperlipidemia, unspecified: Secondary | ICD-10-CM | POA: Diagnosis not present

## 2023-12-22 LAB — CBC WITH DIFFERENTIAL (CANCER CENTER ONLY)
Abs Immature Granulocytes: 0.01 K/uL (ref 0.00–0.07)
Basophils Absolute: 0 K/uL (ref 0.0–0.1)
Basophils Relative: 1 %
Eosinophils Absolute: 0.3 K/uL (ref 0.0–0.5)
Eosinophils Relative: 6 %
HCT: 32.5 % — ABNORMAL LOW (ref 39.0–52.0)
Hemoglobin: 11.2 g/dL — ABNORMAL LOW (ref 13.0–17.0)
Immature Granulocytes: 0 %
Lymphocytes Relative: 9 %
Lymphs Abs: 0.4 K/uL — ABNORMAL LOW (ref 0.7–4.0)
MCH: 31.7 pg (ref 26.0–34.0)
MCHC: 34.5 g/dL (ref 30.0–36.0)
MCV: 92.1 fL (ref 80.0–100.0)
Monocytes Absolute: 0.3 K/uL (ref 0.1–1.0)
Monocytes Relative: 7 %
Neutro Abs: 3.9 K/uL (ref 1.7–7.7)
Neutrophils Relative %: 77 %
Platelet Count: 99 K/uL — ABNORMAL LOW (ref 150–400)
RBC: 3.53 MIL/uL — ABNORMAL LOW (ref 4.22–5.81)
RDW: 12.4 % (ref 11.5–15.5)
WBC Count: 5 K/uL (ref 4.0–10.5)
nRBC: 0 % (ref 0.0–0.2)

## 2023-12-22 LAB — IRON AND TIBC
Iron: 109 ug/dL (ref 45–182)
Saturation Ratios: 27 % (ref 17.9–39.5)
TIBC: 407 ug/dL (ref 250–450)
UIBC: 298 ug/dL

## 2023-12-22 LAB — CMP (CANCER CENTER ONLY)
ALT: 5 U/L (ref 0–44)
AST: 24 U/L (ref 15–41)
Albumin: 4.4 g/dL (ref 3.5–5.0)
Alkaline Phosphatase: 42 U/L (ref 38–126)
Anion gap: 9 (ref 5–15)
BUN: 29 mg/dL — ABNORMAL HIGH (ref 8–23)
CO2: 25 mmol/L (ref 22–32)
Calcium: 9.8 mg/dL (ref 8.9–10.3)
Chloride: 100 mmol/L (ref 98–111)
Creatinine: 1.37 mg/dL — ABNORMAL HIGH (ref 0.61–1.24)
GFR, Estimated: 57 mL/min — ABNORMAL LOW (ref >60)
Glucose, Bld: 153 mg/dL — ABNORMAL HIGH (ref 70–99)
Potassium: 4.9 mmol/L (ref 3.5–5.1)
Sodium: 134 mmol/L — ABNORMAL LOW (ref 135–145)
Total Bilirubin: 0.9 mg/dL (ref 0.0–1.2)
Total Protein: 7.5 g/dL (ref 6.5–8.1)

## 2023-12-22 LAB — FERRITIN: Ferritin: 256 ng/mL (ref 24–336)

## 2023-12-22 LAB — VITAMIN B12: Vitamin B-12: 1182 pg/mL — ABNORMAL HIGH (ref 180–914)

## 2023-12-22 LAB — TSH: TSH: 2.128 u[IU]/mL (ref 0.350–4.500)

## 2023-12-22 LAB — FOLATE: Folate: >40 ng/mL (ref >5.9)

## 2023-12-22 NOTE — Progress Notes (Signed)
 Patient doing well today. He is concerned that the pain he was having on his right side has no moved to the left side. When pain is present it is about a 2-3 on pain scale from 1-10 and also affects his ear.  He also has stopped Gabapentin  medication, due to it no longer working.

## 2023-12-22 NOTE — Progress Notes (Signed)
 Survivorship Care Plan visit completed.  Treatment summary reviewed and given to patient.  ASCO answers booklet reviewed and given to patient.  CARE program and Cancer Transitions discussed with patient along with other resources cancer center offers to patients and caregivers.  Patient verbalized understanding.

## 2023-12-22 NOTE — Progress Notes (Signed)
 Outbound call to [facility] [phone] to re-establish care.  Recording says the office is currently closed for lunch 12pm-1pm. Called back, spoke to [place] and she asked that patient contact them to re-establish care; patient last seen in January.  Outbound call to patient; spoke with spouse [name] and informed of above.  [name] said said she would contact them for him.    IR port removal referral placed; secure chat sent to Clarita Ricker informing of referral.

## 2023-12-22 NOTE — Progress Notes (Signed)
 Nutrition Follow-up:  Patient with stage I SCC of oropharynx, p 16+.  Patient has completed chemotherapy and radiation.  PEG tube placed on 09/30/23.    Met with patient and wife following MD visit.  Reports that patient is eating better orally.  Yesterday able to eat 1/2 ham and cheese sub, then whole [employer] sandwich, drank 2 [employer] milkshakes(likes oreo), eggs and pancakes for breakfast.  Drinking water  and gatorade.  Has been eating green beans, potatoes, potato salad, fish, slaw, fried okra.  Says that burger from [employer] recently and fried foods burn/hurt his tongue.  Denies trouble swallowing.  Continues taking pantoprazole .  Taste is still decreased but eating well.    Says that he has not used feeding tube for nutrition in the last 3 weeks.  Continues to flush tube daily with syringe of water .    PET scan completed  Medications: reviewed  Labs: not back yet  Anthropometrics:   Weight 160 lb today (increased) 150 lb on 7/1 146 lb on 6/3 145 lb on 5/27 UBW of 177 lb on 06/24/23   Estimated Energy Needs  Kcals: 1750-2100 Protein: 84-105 g Fluid: 1750-2138ml  NUTRITION DIAGNOSIS: Inadequate oral intake improved, weight increased   MALNUTRITION DIAGNOSIS: Severe malnutrition resolved   INTERVENTION:  If weight is stable after 4 weeks without using feeding tube for nutrition would recommend removal of tube.   Flush tube daily with at 60ml of water  until removal. Continue dressing change.   Patient will keep check on weight at home. Encouraged variety of foods and textures including good sources of protein.  Wife has contact information if needed     NEXT VISIT: no follow-up RD available if needed  Vivika Poythress B. Dasie SOLON, CSO, LDN Registered Dietitian [phone]

## 2023-12-22 NOTE — Progress Notes (Unsigned)
 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 He was ultimately referred By his dentist to ENT and underwent CT soft tissue neck which showed a 4 cm partially necrotic mass at the glossotonsillar sulcus on the right side and to level 2 lymph nodes measuring 15 and 14 mm that appeared pathologic. Core needle biopsy of the level 2 lymph node was consistent with squamous cell carcinoma. Cells diffusely positive for p40 and p16.  Patient completed concurrent chemoradiation with weekly cisplatin  in May 2025.  Treatment complicated by cytopenias as well as malnutrition requiring PEG tube placement  Interval history-patient is not using his PEG tube over the last several weeks and is able to maintain his nutrition orally.  He is reporting new pain in his left neck.  He also has symptoms of chronic restless leg syndrome and has been seen by neurology previously.  ECOG PS- 1 Pain scale- 3 Opioid associated constipation- no  Review of systems- Review of Systems  Constitutional:  Positive for malaise/fatigue.  HENT:         Left neck pain      Allergies  Allergen Reactions  . Mirtazapine Rash  . Other Itching and Rash    ETCO2 tubing. Medtronic Microstream advance      Past Medical History:  Diagnosis Date  . Anemia   . Anxiety   . Arthritis   . Carpal tunnel syndrome   . Depression   . Diabetes (HCC)   . Hyperlipidemia   . Hypertension   . Loss of teeth due to extraction   . Neoplasm related pain   . Parkinson's disease [facility])      Past Surgical History:  Procedure Laterality Date  . CARPAL TUNNEL RELEASE Right 11/28/2020   Procedure: CARPAL TUNNEL RELEASE ENDOSCOPIC;  Surgeon: Edie Norleen JINNY, MD;  Location: [facility] ORS;  Service: Orthopedics;  Laterality: Right;  . CARPAL TUNNEL RELEASE Left 01/22/2021   Procedure: CARPAL TUNNEL RELEASE ENDOSCOPIC;  Surgeon: Edie Norleen JINNY, MD;  Location: [facility] ORS;  Service: Orthopedics;  Laterality: Left;  . INSERTION, GASTROSTOMY TUBE, ROBOT-ASSISTED N/A 09/30/2023   Procedure: INSERTION, GASTROSTOMY TUBE, ROBOT-ASSISTED;  Surgeon: Jordis Laneta FALCON, MD;  Location: [facility] ORS;  Service: General;  Laterality: N/A;  . IR IMAGING GUIDED PORT INSERTION  06/30/2023  . KNEE ARTHROSCOPY Right   . KNEE ARTHROSCOPY Left   . MANDIBLE FRACTURE SURGERY     as teenager    Social History   Socioeconomic History  . Marital status: Significant Other    Spouse name: [name]  . Number of children: Not on file  . Years of education: Not on file  . Highest education level: Not on file  Occupational History  . Not on file  Tobacco Use  . Smoking status: Never    Passive exposure: Never  .  Smokeless tobacco: Never  Vaping Use  . Vaping status: Never Used  Substance and Sexual Activity  . Alcohol use: Not Currently  . Drug use: Never  . Sexual activity: Not Currently    Birth control/protection: None  Other Topics Concern  . Not on file  Social History Narrative   Lives with friend   Social Drivers of Health   Financial Resource Strain: Low Risk  (10/28/2023)   [facility] from [facility]   Overall Financial Resource Strain (CARDIA)   . Difficulty of Paying Living Expenses: Not hard at all   Food Insecurity: No Food Insecurity (10/28/2023)   Received from [facility]   Hunger Vital Sign   . Within the past 12 months, you worried that your food would run out before you got the money to buy more.: Never true   . Within the past 12 months, the food you bought just didn't last and you didn't have money to get more.: Never true  Transportation Needs: No Transportation Needs (10/28/2023)   Received from [facility]   [facility] - Transportation   . In the past 12 months, has lack of transportation kept you from medical appointments or from getting medications?: No   . Lack of Transportation (Non-Medical): No  Physical Activity: Inactive (09/29/2023)   Exercise Vital Sign   . Days of Exercise per Week: 0 days   . Minutes of Exercise per Session: 0 min  Stress: No Stress Concern Present (09/29/2023)   [employer] of Occupational Health - Occupational Stress Questionnaire   . Feeling of Stress : Not at all  Social Connections: Moderately Isolated (09/29/2023)   Social Connection and Isolation Panel   . Frequency of Communication with Friends and Family: Three times a week   . Frequency of Social Gatherings with Friends and Family: Twice a week   . Attends Religious Services: Never   . Active Member of Clubs or Organizations: No   . Attends Banker Meetings: Never   . Marital Status: Married  Catering manager Violence: Not At Risk (06/22/2023)   Humiliation, Afraid, Rape, and Kick questionnaire   . Fear of Current or Ex-Partner: No   . Emotionally Abused: No   . Physically Abused: No   . Sexually Abused: No    Family History  Problem Relation Age of Onset  . Diabetes Mother   . Hypertension Mother   . CVA Father   . Heart attack Father   . Depression Sister   . Depression Brother      Current Outpatient Medications:  .  busPIRone  (BUSPAR ) 7.5 MG tablet, Take 7.5 mg by mouth 2 (two) times daily., Disp: , Rfl:  .   ACCU-CHEK GUIDE TEST test strip, TEST 2X DAILY, Disp: , Rfl:  .  Accu-Chek Softclix Lancets lancets, 2 (two) times daily., Disp: , Rfl:  .  atenolol (TENORMIN) 50 MG tablet, Take 50 mg by mouth daily. (Patient not taking: Reported on 11/26/2023), Disp: , Rfl:  .  Blood Glucose Monitoring Suppl (ACCU-CHEK GUIDE ME) w/Device KIT, See admin instructions., Disp: , Rfl:  .  buPROPion  (WELLBUTRIN  XL) 300 MG 24 hr tablet, Take 1 tablet (300 mg total) by mouth daily. Total of 450 mg daily. Take along with 150 mg tab, Disp: 30 tablet, Rfl: 5 .  calcium  carbonate (CALCIUM  600) 600 MG TABS tablet, Take 1 tablet (600 mg total) by mouth daily. For low calcium , Disp: 60 tablet, Rfl: 0 .  calcium  carbonate (TUMS) 500 MG chewable tablet, Chew 500 mg by mouth daily., Disp: , Rfl:  .  carbidopa -levodopa  (SINEMET  IR) 25-100 MG tablet, Take 2-3 tablets by mouth See admin instructions. Take 3 tablets in morning 2 tablet at bedtime (Patient taking differently: Take 1 tablet by mouth in the morning and at bedtime.), Disp: , Rfl:  .  clonazePAM  (KLONOPIN ) 1 MG tablet, Take 1 tablet (1 mg total) by mouth 3 (three) times daily as needed for anxiety., Disp: 90 tablet, Rfl: 0 .  cyanocobalamin  (VITAMIN B12) 1000 MCG tablet, Take 1,000 mcg by mouth daily., Disp: , Rfl:  .  fentaNYL  (DURAGESIC ) 12 MCG/HR, Place 1 patch onto the skin every 3 (three) days., Disp: 10 patch, Rfl: 0 .  folic acid  (FOLVITE ) 1 MG tablet, Take 1 tablet (1 mg total) by mouth daily., Disp: 30 tablet, Rfl: 3 .  gabapentin  (NEURONTIN ) 300 MG capsule, Take 1 capsule (300 mg total) by mouth at bedtime. (Patient not taking: Reported on 12/22/2023), Disp: 30 capsule, Rfl: 3 .  ketoconazole (NIZORAL) 2 % shampoo, Apply 1 application  topically every other day., Disp: , Rfl:  .  lidocaine -prilocaine  (EMLA ) cream, Apply to affected area once, Disp: 30 g, Rfl: 3 .  magnesium  chloride (SLOW-MAG) 64 MG TBEC SR tablet, Take 2 tablets (128 mg total) by mouth 2 (two) times  daily. For low magnesium , Disp: 120 tablet, Rfl: 00 .  metFORMIN  (GLUCOPHAGE ) 500 MG tablet, Take 500 mg by mouth daily with breakfast., Disp: , Rfl:  .  Multiple Vitamin (MULTI-VITAMIN) tablet, Take 1 tablet by mouth daily., Disp: , Rfl:  .  Nutritional Supplements (NUTREN 1.5) LIQD, Give 1 carton in feeding tube via bolus syringe 5 times a day.  Flush with 60cc of water  before and 120cc of water  after., Disp: , Rfl:  .  ondansetron  (ZOFRAN ) 8 MG tablet, Take 1 tablet (8 mg total) by mouth every 8 (eight) hours as needed for nausea or vomiting. Start on the third day after cisplatin ., Disp: 30 tablet, Rfl: 1 .  Oxycodone  HCl 10 MG TABS, Take 1 tablet (10 mg total) by mouth every 4 (four) hours as needed., Disp: 60 tablet, Rfl: 0 .  pantoprazole  (PROTONIX ) 40 MG tablet, Take 1 tablet (40 mg total) by mouth daily., Disp: 30 tablet, Rfl: 3 .  polyethylene glycol (MIRALAX / GLYCOLAX) 17 g packet, Take 17 g by mouth daily., Disp: , Rfl:  .  prochlorperazine  (COMPAZINE ) 10 MG tablet, Take 1 tablet (10 mg total) by mouth every 6 (six) hours as needed for nausea or vomiting., Disp: 30 tablet, Rfl: 1 .  QUEtiapine  (SEROQUEL ) 50 MG tablet, Take 1 tablet (50 mg total) by mouth at bedtime. (Patient not taking: Reported on 12/22/2023), Disp: 30 tablet, Rfl: 0 .  rosuvastatin  (CRESTOR ) 20 MG tablet, Take 20 mg by mouth daily., Disp: , Rfl:  .  senna (SENOKOT) 8.6 MG tablet, Take 1 tablet by mouth 2 (two) times daily., Disp: , Rfl:  .  sertraline  (ZOLOFT ) 100 MG tablet, Take 1.5 tablets (150 mg total) by mouth at bedtime., Disp: 45 tablet, Rfl: 3 .  sucralfate  (CARAFATE ) 1 g tablet, Take 1 tablet (1 g total) by mouth 3 (three) times daily before meals. Dissolve in 4 tbs of warm water , swish and swallow, Disp: 90 tablet, Rfl: 1 .  tadalafil (CIALIS) 5 MG tablet, Take 5 mg by mouth daily., Disp: , Rfl:  .  tamsulosin (FLOMAX) 0.4 MG CAPS capsule, Take 0.4 mg by mouth  daily., Disp: , Rfl:  .  traZODone  (DESYREL ) 50 MG  tablet, Take 0.5-2 tablets (25-100 mg total) by mouth at bedtime as needed for sleep. (Patient not taking: Reported on 12/22/2023), Disp: 60 tablet, Rfl: 3  Physical exam:  Vitals:   12/22/23 0957  BP: 114/76  Pulse: 78  Resp: 18  Temp: (!) 97.3 F (36.3 C)  TempSrc: Tympanic  SpO2: 99%  Weight: 160 lb 4.8 oz (72.7 kg)  Height: 5' 9 (1.753 m)   Physical Exam HENT:     Mouth/Throat:     Mouth: Mucous membranes are moist.     Pharynx: Oropharynx is clear.  Cardiovascular:     Rate and Rhythm: Normal rate and regular rhythm.     Heart sounds: Normal heart sounds.  Pulmonary:     Effort: Pulmonary effort is normal.     Breath sounds: Normal breath sounds.  Abdominal:     General: Bowel sounds are normal.     Palpations: Abdomen is soft.     Comments: PEG tube in place  Lymphadenopathy:     Comments: No palpable cervical adenopathy   Skin:    General: Skin is warm and dry.  Neurological:     Mental Status: He is alert and oriented to person, place, and time.      I have personally reviewed labs listed below:    Latest Ref Rng & Units 12/22/2023   11:04 AM  CMP  Glucose 70 - 99 mg/dL 846   BUN 8 - 23 mg/dL 29   Creatinine 9.38 - 1.24 mg/dL 8.62   Sodium 864 - 854 mmol/L 134   Potassium 3.5 - 5.1 mmol/L 4.9   Chloride 98 - 111 mmol/L 100   CO2 22 - 32 mmol/L 25   Calcium  8.9 - 10.3 mg/dL 9.8   Total Protein 6.5 - 8.1 g/dL 7.5   Total Bilirubin 0.0 - 1.2 mg/dL 0.9   Alkaline Phos 38 - 126 U/L 42   AST 15 - 41 U/L 24   ALT 0 - 44 U/L 5       Latest Ref Rng & Units 12/22/2023   11:04 AM  CBC  WBC 4.0 - 10.5 K/uL 5.0   Hemoglobin 13.0 - 17.0 g/dL 88.7   Hematocrit 60.9 - 52.0 % 32.5   Platelets 150 - 400 K/uL 99    I have personally reviewed Radiology images listed below: No images are attached to the encounter.  NM PET Image Restage (PS) Skull Base to Thigh (F-18 FDG) Result Date: 11/23/2023 CLINICAL DATA:  Subsequent treatment strategy for squamous cell  carcinoma of oropharynx. Gastrostomy tube insertion in February. EXAM: NUCLEAR MEDICINE PET SKULL BASE TO THIGH TECHNIQUE: 8.5 mCi F-18 FDG was injected intravenously. Full-ring PET imaging was performed from the skull base to thigh after the radiotracer. CT data was obtained and used for attenuation correction and anatomic localization. Fasting blood glucose: 111 mg/dl COMPARISON:  97/78/7974 FINDINGS: Mediastinal blood pool activity: SUV max 2.3 Liver activity: SUV max NA NECK: Resolution of previously described right-sided oropharyngeal mass. The right-sided level 2 cervical nodal hypermetabolism has resolved. Incidental CT findings: Cerebral atrophy.  No cervical adenopathy. CHEST: No pulmonary parenchymal or thoracic nodal hypermetabolism. Incidental CT findings: Right Port-A-Cath tip high right atrium. Mild cardiomegaly. Aortic and coronary artery calcification. Mild distal esophageal wall thickening including on 77/6 suggests esophagitis. ABDOMEN/PELVIS: No abdominopelvic parenchymal or nodal hypermetabolism. Incidental CT findings: Caudate and lateral segment left liver lobe enlargement. Mild splenomegaly including 13.2  cm greatest transverse dimension. Gastrostomy tube. Normal adrenal glands. Abdominal aortic atherosclerosis. Subtle pancreatic parenchymal calcifications favored over vascular etiology. SKELETON: No abnormal marrow activity. Incidental CT findings: None. IMPRESSION: 1. Complete metabolic response of oropharyngeal primary and ipsilateral nodal metastasis. 2. Distal esophageal wall thickening suggests esophagitis. 3. Hepatic morphology is suspicious for mild cirrhosis, especially given the EMR history of alcohol use disorder in remission. Similarly, subtle pancreatic calcifications are favored to be parenchymal and represent alcohol related chronic calcific pancreatitis. 4. Coronary artery atherosclerosis. Aortic Atherosclerosis (ICD10-I70.0). Electronically Signed   By: Rockey Kilts M.D.   On:  11/23/2023 15:43     Assessment and plan- Patient is a 68 y.o. 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  He is status post concurrent chemoradiation therapy and here to discuss PET CT scan results and further management   Visit Diagnosis 1. Squamous cell carcinoma of oropharynx (HCC)      Dr. Annah Skene, MD, MPH [facility] [phone] 12/22/2023 3:05 PM

## 2023-12-22 NOTE — Progress Notes (Signed)
 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 He was ultimately referred By his dentist to ENT and underwent CT soft tissue neck which showed a 4 cm partially necrotic mass at the glossotonsillar sulcus on the right side and to level 2 lymph nodes measuring 15 and 14 mm that appeared pathologic. Core needle biopsy of the level 2 lymph node was consistent with squamous cell carcinoma. Cells diffusely positive for p40 and p16.  Patient completed concurrent chemoradiation with weekly cisplatin  in May 2025.  Treatment complicated by cytopenias as well as malnutrition requiring PEG tube placement  Interval history-patient is not using his PEG tube over the last several weeks and is able to maintain his nutrition orally.  He is reporting new pain in his left neck.  He also has symptoms of chronic restless leg syndrome and has been seen by neurology previously.  ECOG PS- 1 Pain scale- 3 Opioid associated constipation- no  Review of systems- Review of Systems  Constitutional:  Positive for malaise/fatigue. Negative for chills, fever and weight loss.  HENT:  Negative for congestion, ear discharge and nosebleeds.        Left neck pain  Eyes:  Negative for blurred vision.   Respiratory:  Negative for cough, hemoptysis, sputum production, shortness of breath and wheezing.   Cardiovascular:  Negative for chest pain, palpitations, orthopnea and claudication.  Gastrointestinal:  Negative for abdominal pain, blood in stool, constipation, diarrhea, heartburn, melena, nausea and vomiting.  Genitourinary:  Negative for dysuria, flank pain, frequency, hematuria and urgency.  Musculoskeletal:  Negative for back pain, joint pain and myalgias.  Skin:  Negative for rash.  Neurological:  Negative for dizziness, tingling, focal weakness, seizures, weakness and headaches.  Endo/Heme/Allergies:  Does not bruise/bleed easily.  Psychiatric/Behavioral:  Negative for depression and suicidal ideas. The patient does not have insomnia.       Allergies  Allergen Reactions   Mirtazapine Rash   Other Itching and Rash    ETCO2 tubing. Medtronic Microstream advance     Past Medical History:  Diagnosis Date   Anemia    Anxiety    Arthritis    Carpal tunnel syndrome    Depression    Diabetes (HCC)    Hyperlipidemia    Hypertension    Loss of teeth due to extraction    Neoplasm related pain    Parkinson's disease [facility])      Past Surgical History:  Procedure Laterality Date   CARPAL TUNNEL RELEASE Right 11/28/2020   Procedure: CARPAL TUNNEL RELEASE ENDOSCOPIC;  Surgeon: Edie Norleen JINNY, MD;  Location: [facility] ORS;  Service: Orthopedics;  Laterality: Right;   CARPAL TUNNEL RELEASE Left 01/22/2021   Procedure: CARPAL TUNNEL RELEASE ENDOSCOPIC;  Surgeon: Edie Norleen JINNY, MD;  Location: [facility] ORS;  Service: Orthopedics;  Laterality: Left;   INSERTION,  GASTROSTOMY TUBE, ROBOT-ASSISTED N/A 09/30/2023   Procedure: INSERTION, GASTROSTOMY TUBE, ROBOT-ASSISTED;  Surgeon: Jordis Laneta FALCON, MD;  Location: [facility] ORS;  Service: General;  Laterality: N/A;   IR IMAGING GUIDED PORT INSERTION  06/30/2023   KNEE ARTHROSCOPY Right    KNEE ARTHROSCOPY Left    MANDIBLE FRACTURE SURGERY     as teenager     Social History   Socioeconomic History   Marital status: Significant Other    Spouse name: [name]   Number of children: Not on file   Years of education: Not on file   Highest education level: Not on file  Occupational History   Not on file  Tobacco Use   Smoking status: Never    Passive exposure: Never   Smokeless tobacco: Never  Vaping Use   Vaping status: Never Used  Substance and Sexual Activity   Alcohol use: Not Currently   Drug use: Never   Sexual activity: Not Currently    Birth control/protection: None  Other Topics Concern   Not on file  Social History Narrative   Lives with friend   Social Drivers of Health   Financial Resource Strain: Low Risk  (10/28/2023)   Received from [facility]   Overall Financial Resource Strain (CARDIA)    Difficulty of Paying Living Expenses: Not hard at all  Food Insecurity: No Food Insecurity (10/28/2023)   Received from [facility]   Hunger Vital Sign    Within the past 12 months, you worried that your food would run out before you got the money to buy more.: Never true    Within the past 12 months, the food you bought just didn't last and you didn't have money to get more.: Never true  Transportation Needs: No Transportation Needs (10/28/2023)   Received from [facility] - Transportation    In the past 12 months, has lack of transportation kept you from medical appointments or from getting medications?: No    Lack of Transportation (Non-Medical): No  Physical Activity: Inactive (09/29/2023)   Exercise Vital Sign    Days of Exercise per Week: 0 days    Minutes of Exercise per Session: 0 min  Stress: No Stress Concern Present (09/29/2023)   [employer] Occupational [employer] - Occupational Stress Questionnaire    Feeling of Stress : Not at all  Social Connections: Moderately Isolated (09/29/2023)   Social Connection and Isolation Panel    Frequency of  Communication with Friends and Family: Three times a week    Frequency of Social Gatherings with Friends and Family: Twice a week    Attends Religious Services: Never    Database administrator or Organizations: No    Attends Banker Meetings: Never    Marital Status: Married  Catering manager Violence: Not At Risk (06/22/2023)   Humiliation, Afraid, Rape, and Kick questionnaire    Fear of Current or Ex-Partner: No    Emotionally Abused: No    Physically Abused: No    Sexually Abused: No    Family History  Problem Relation Age of Onset   Diabetes Mother    Hypertension Mother    CVA Father    Heart attack Father    Depression Sister    Depression Brother      Current Outpatient Medications:    busPIRone  (BUSPAR ) 7.5 MG tablet, Take 7.5 mg by mouth 2 (two) times daily., Disp: , Rfl:    ACCU-CHEK GUIDE  TEST test strip, TEST 2X DAILY, Disp: , Rfl:    Accu-Chek Softclix Lancets lancets, 2 (two) times daily., Disp: , Rfl:    atenolol (TENORMIN) 50 MG tablet, Take 50 mg by mouth daily. (Patient not taking: Reported on 11/26/2023), Disp: , Rfl:    Blood Glucose Monitoring Suppl (ACCU-CHEK GUIDE ME) w/Device KIT, See admin instructions., Disp: , Rfl:    buPROPion  (WELLBUTRIN  XL) 300 MG 24 hr tablet, Take 1 tablet (300 mg total) by mouth daily. Total of 450 mg daily. Take along with 150 mg tab, Disp: 30 tablet, Rfl: 5   calcium  carbonate (CALCIUM  600) 600 MG TABS tablet, Take 1 tablet (600 mg total) by mouth daily. For low calcium , Disp: 60 tablet, Rfl: 0   calcium  carbonate (TUMS) 500 MG chewable tablet, Chew 500 mg by mouth daily., Disp: , Rfl:    carbidopa -levodopa  (SINEMET  IR) 25-100 MG tablet, Take 2-3 tablets by mouth See admin instructions. Take 3 tablets in morning 2 tablet at bedtime (Patient taking differently: Take 1 tablet by mouth in the morning and at bedtime.), Disp: , Rfl:    clonazePAM  (KLONOPIN ) 1 MG tablet, Take 1 tablet (1 mg total) by mouth 3 (three) times  daily as needed for anxiety., Disp: 90 tablet, Rfl: 0   cyanocobalamin  (VITAMIN B12) 1000 MCG tablet, Take 1,000 mcg by mouth daily., Disp: , Rfl:    fentaNYL  (DURAGESIC ) 12 MCG/HR, Place 1 patch onto the skin every 3 (three) days., Disp: 10 patch, Rfl: 0   folic acid  (FOLVITE ) 1 MG tablet, Take 1 tablet (1 mg total) by mouth daily., Disp: 30 tablet, Rfl: 3   gabapentin  (NEURONTIN ) 300 MG capsule, Take 1 capsule (300 mg total) by mouth at bedtime. (Patient not taking: Reported on 12/22/2023), Disp: 30 capsule, Rfl: 3   ketoconazole (NIZORAL) 2 % shampoo, Apply 1 application  topically every other day., Disp: , Rfl:    lidocaine -prilocaine  (EMLA ) cream, Apply to affected area once, Disp: 30 g, Rfl: 3   magnesium  chloride (SLOW-MAG) 64 MG TBEC SR tablet, Take 2 tablets (128 mg total) by mouth 2 (two) times daily. For low magnesium , Disp: 120 tablet, Rfl: 00   metFORMIN  (GLUCOPHAGE ) 500 MG tablet, Take 500 mg by mouth daily with breakfast., Disp: , Rfl:    Multiple Vitamin (MULTI-VITAMIN) tablet, Take 1 tablet by mouth daily., Disp: , Rfl:    Nutritional Supplements (NUTREN 1.5) LIQD, Give 1 carton in feeding tube via bolus syringe 5 times a day.  Flush with 60cc of water  before and 120cc of water  after., Disp: , Rfl:    ondansetron  (ZOFRAN ) 8 MG tablet, Take 1 tablet (8 mg total) by mouth every 8 (eight) hours as needed for nausea or vomiting. Start on the third day after cisplatin ., Disp: 30 tablet, Rfl: 1   Oxycodone  HCl 10 MG TABS, Take 1 tablet (10 mg total) by mouth every 4 (four) hours as needed., Disp: 60 tablet, Rfl: 0   pantoprazole  (PROTONIX ) 40 MG tablet, Take 1 tablet (40 mg total) by mouth daily., Disp: 30 tablet, Rfl: 3   polyethylene glycol (MIRALAX / GLYCOLAX) 17 g packet, Take 17 g by mouth daily., Disp: , Rfl:    prochlorperazine  (COMPAZINE ) 10 MG tablet, Take 1 tablet (10 mg total) by mouth every 6 (six) hours as needed for nausea or vomiting., Disp: 30 tablet, Rfl: 1   QUEtiapine   (SEROQUEL ) 50 MG tablet, Take 1 tablet (50 mg total) by mouth at bedtime. (Patient not taking: Reported on 12/22/2023),  Disp: 30 tablet, Rfl: 0   rosuvastatin  (CRESTOR ) 20 MG tablet, Take 20 mg by mouth daily., Disp: , Rfl:    senna (SENOKOT) 8.6 MG tablet, Take 1 tablet by mouth 2 (two) times daily., Disp: , Rfl:    sertraline  (ZOLOFT ) 100 MG tablet, Take 1.5 tablets (150 mg total) by mouth at bedtime., Disp: 45 tablet, Rfl: 3   sucralfate  (CARAFATE ) 1 g tablet, Take 1 tablet (1 g total) by mouth 3 (three) times daily before meals. Dissolve in 4 tbs of warm water , swish and swallow, Disp: 90 tablet, Rfl: 1   tadalafil (CIALIS) 5 MG tablet, Take 5 mg by mouth daily., Disp: , Rfl:    tamsulosin (FLOMAX) 0.4 MG CAPS capsule, Take 0.4 mg by mouth daily., Disp: , Rfl:    traZODone  (DESYREL ) 50 MG tablet, Take 0.5-2 tablets (25-100 mg total) by mouth at bedtime as needed for sleep. (Patient not taking: Reported on 12/22/2023), Disp: 60 tablet, Rfl: 3  Physical exam:  Vitals:   12/22/23 0957  BP: 114/76  Pulse: 78  Resp: 18  Temp: (!) 97.3 F (36.3 C)  TempSrc: Tympanic  SpO2: 99%  Weight: 160 lb 4.8 oz (72.7 kg)  Height: 5' 9 (1.753 m)   Physical Exam HENT:     Mouth/Throat:     Mouth: Mucous membranes are moist.     Pharynx: Oropharynx is clear.  Cardiovascular:     Rate and Rhythm: Normal rate and regular rhythm.     Heart sounds: Normal heart sounds.  Pulmonary:     Effort: Pulmonary effort is normal.     Breath sounds: Normal breath sounds.  Abdominal:     General: Bowel sounds are normal.     Palpations: Abdomen is soft.     Comments: PEG tube in place  Lymphadenopathy:     Comments: No palpable cervical adenopathy   Skin:    General: Skin is warm and dry.  Neurological:     Mental Status: He is alert and oriented to person, place, and time.      I have personally reviewed labs listed below:    Latest Ref Rng & Units 12/22/2023   11:04 AM  CMP  Glucose 70 - 99 mg/dL  846   BUN 8 - 23 mg/dL 29   Creatinine 9.38 - 1.24 mg/dL 8.62   Sodium 864 - 854 mmol/L 134   Potassium 3.5 - 5.1 mmol/L 4.9   Chloride 98 - 111 mmol/L 100   CO2 22 - 32 mmol/L 25   Calcium  8.9 - 10.3 mg/dL 9.8   Total Protein 6.5 - 8.1 g/dL 7.5   Total Bilirubin 0.0 - 1.2 mg/dL 0.9   Alkaline Phos 38 - 126 U/L 42   AST 15 - 41 U/L 24   ALT 0 - 44 U/L 5       Latest Ref Rng & Units 12/22/2023   11:04 AM  CBC  WBC 4.0 - 10.5 K/uL 5.0   Hemoglobin 13.0 - 17.0 g/dL 88.7   Hematocrit 60.9 - 52.0 % 32.5   Platelets 150 - 400 K/uL 99    I have personally reviewed Radiology images listed below: No images are attached to the encounter.  NM PET Image Restage (PS) Skull Base to Thigh (F-18 FDG) Result Date: 11/23/2023 CLINICAL DATA:  Subsequent treatment strategy for squamous cell carcinoma of oropharynx. Gastrostomy tube insertion in February. EXAM: NUCLEAR MEDICINE PET SKULL BASE TO THIGH TECHNIQUE: 8.5 mCi F-18 FDG was injected intravenously.  Full-ring PET imaging was performed from the skull base to thigh after the radiotracer. CT data was obtained and used for attenuation correction and anatomic localization. Fasting blood glucose: 111 mg/dl COMPARISON:  97/78/7974 FINDINGS: Mediastinal blood pool activity: SUV max 2.3 Liver activity: SUV max NA NECK: Resolution of previously described right-sided oropharyngeal mass. The right-sided level 2 cervical nodal hypermetabolism has resolved. Incidental CT findings: Cerebral atrophy.  No cervical adenopathy. CHEST: No pulmonary parenchymal or thoracic nodal hypermetabolism. Incidental CT findings: Right Port-A-Cath tip high right atrium. Mild cardiomegaly. Aortic and coronary artery calcification. Mild distal esophageal wall thickening including on 77/6 suggests esophagitis. ABDOMEN/PELVIS: No abdominopelvic parenchymal or nodal hypermetabolism. Incidental CT findings: Caudate and lateral segment left liver lobe enlargement. Mild splenomegaly including  13.2 cm greatest transverse dimension. Gastrostomy tube. Normal adrenal glands. Abdominal aortic atherosclerosis. Subtle pancreatic parenchymal calcifications favored over vascular etiology. SKELETON: No abnormal marrow activity. Incidental CT findings: None. IMPRESSION: 1. Complete metabolic response of oropharyngeal primary and ipsilateral nodal metastasis. 2. Distal esophageal wall thickening suggests esophagitis. 3. Hepatic morphology is suspicious for mild cirrhosis, especially given the EMR history of alcohol use disorder in remission. Similarly, subtle pancreatic calcifications are favored to be parenchymal and represent alcohol related chronic calcific pancreatitis. 4. Coronary artery atherosclerosis. Aortic Atherosclerosis (ICD10-I70.0). Electronically Signed   By: Rockey Kilts M.D.   On: 11/23/2023 15:43     Assessment and plan- Patient is a 68 y.o. 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  He is status post concurrent chemoradiation therapy and here to discuss PET CT scan results and further management  I have Reviewed PET CT scan images and pendantly and discussed findings with the patient.  Patient has completed chemoradiation and present PET scan does not show any evidence of recurrent or progressive disease.  Complete metabolic response in the oropharyngeal primary and ipsilateral nodal metastases.  I would like him to go back to ENT for routine NPL exams.  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  He also has some ongoing anemia.  I would like to repeat his CBC ferritin and iron studies B12 folate and TSH today.  Patient also had some evidence of AKI in the past which was looking better last month and I will repeat CMP today.  Overall patient's nutritional status is improved and he is not using his PEG tube.  He is meeting Dr. Jordis next week and will discuss removal of PEG tube.  Will also reach out  to vascular surgery to get his port taken out  I will see him back in 3 months with labs   Visit Diagnosis 1. Squamous cell carcinoma of oropharynx (HCC)      Dr. Annah Skene, MD, MPH [facility] at [facility] [phone] 12/22/2023 3:05 PM

## 2023-12-23 ENCOUNTER — Telehealth: Payer: Self-pay

## 2023-12-23 ENCOUNTER — Encounter: Payer: Self-pay | Admitting: Oncology

## 2023-12-23 NOTE — Progress Notes (Deleted)
 BH MD/PA/NP OP Progress Note  12/23/2023 2:16 PM Ricky [name]  MRN:  [id number]  Chief Complaint: No chief complaint on file.  HPI: *** - gabapentin  was uptitrated to 300 mg for restless leg  Visit Diagnosis: No diagnosis found.  Past Psychiatric History: Please see initial evaluation for full details. I have reviewed the history. No updates at this time.     Past Medical History:  Past Medical History:  Diagnosis Date   Anemia    Anxiety    Arthritis    Carpal tunnel syndrome    Depression    Diabetes (HCC)    Hyperlipidemia    Hypertension    Loss of teeth due to extraction    Neoplasm related pain    Parkinson's disease [facility])     Past Surgical History:  Procedure Laterality Date   CARPAL TUNNEL RELEASE Right 11/28/2020   Procedure: CARPAL TUNNEL RELEASE ENDOSCOPIC;  Surgeon: Edie Norleen PARAS, MD;  Location: [facility] ORS;  Service: Orthopedics;  Laterality: Right;   CARPAL TUNNEL RELEASE Left 01/22/2021   Procedure: CARPAL TUNNEL RELEASE ENDOSCOPIC;  Surgeon: Edie Norleen PARAS, MD;  Location: [facility] ORS;  Service: Orthopedics;  Laterality: Left;   INSERTION, GASTROSTOMY TUBE, ROBOT-ASSISTED N/A 09/30/2023   Procedure: INSERTION, GASTROSTOMY TUBE, ROBOT-ASSISTED;  Surgeon: Jordis Laneta FALCON, MD;  Location: [facility] ORS;  Service: General;  Laterality: N/A;   IR IMAGING GUIDED PORT INSERTION  06/30/2023   KNEE ARTHROSCOPY Right    KNEE ARTHROSCOPY Left    MANDIBLE FRACTURE SURGERY     as teenager    Family Psychiatric History: Please see initial evaluation for full details. I have reviewed the history. No updates at this time.     Family History:  Family History  Problem Relation Age of Onset   Diabetes Mother    Hypertension Mother    CVA Father    Heart attack Father    Depression Sister    Depression Brother     Social History:  Social History   Socioeconomic History   Marital status: Significant Other    Spouse name: [name]   Number of children: Not on file   Years  of education: Not on file   Highest education level: Not on file  Occupational History   Not on file  Tobacco Use   Smoking status: Never    Passive exposure: Never   Smokeless tobacco: Never  Vaping Use   Vaping status: Never Used  Substance and Sexual Activity   Alcohol use: Not Currently   Drug use: Never   Sexual activity: Not Currently    Birth control/protection: None  Other Topics Concern   Not on file  Social History Narrative   Lives with friend   Social Drivers of Health   Financial Resource Strain: Low Risk  (10/28/2023)   Received from [facility]   Overall Financial Resource Strain (CARDIA)    Difficulty of Paying Living Expenses: Not hard at all  Food Insecurity: No Food Insecurity (10/28/2023)   Received from [facility]   Hunger Vital Sign    Within the past 12 months, you worried that your food would run out before you got the money to buy more.: Never true    Within the past 12 months, the food you bought just didn't last and you didn't have money to get more.: Never true  Transportation Needs: No Transportation Needs (10/28/2023)   Received from [facility]   [facility] - Transportation  In the past 12 months, has lack of transportation kept you from medical appointments or from getting medications?: No    Lack of Transportation (Non-Medical): No  Physical Activity: Inactive (09/29/2023)   Exercise Vital Sign    Days of Exercise per Week: 0 days    Minutes of Exercise per Session: 0 min  Stress: No Stress Concern Present (09/29/2023)   [employer] [facility] Occupational [employer] - Occupational Stress Questionnaire    Feeling of Stress : Not at all  Social Connections: Moderately Isolated (09/29/2023)   Social Connection and Isolation Panel    Frequency of Communication with Friends and Family: Three times a week    Frequency of Social Gatherings with Friends and Family: Twice a week    Attends Religious  Services: Never    Database administrator or Organizations: No    Attends Banker Meetings: Never    Marital Status: Married    Allergies:  Allergies  Allergen Reactions   Mirtazapine Rash   Other Itching and Rash    ETCO2 tubing. Medtronic Microstream advance    Metabolic Disorder Labs: Lab Results  Component Value Date   HGBA1C 5.2 08/25/2021   MPG 102.54 08/25/2021   No results found for: PROLACTIN No results found for: CHOL, TRIG, HDL, CHOLHDL, VLDL, LDLCALC Lab Results  Component Value Date   TSH 2.128 12/22/2023   TSH 2.016 09/14/2023    Therapeutic Level Labs: No results found for: LITHIUM No results found for: VALPROATE No results found for: CBMZ  Current Medications: Current Outpatient Medications  Medication Sig Dispense Refill   ACCU-CHEK GUIDE TEST test strip TEST 2X DAILY     Accu-Chek Softclix Lancets lancets 2 (two) times daily.     atenolol (TENORMIN) 50 MG tablet Take 50 mg by mouth daily. (Patient not taking: Reported on 11/26/2023)     Blood Glucose Monitoring Suppl (ACCU-CHEK GUIDE ME) w/Device KIT See admin instructions.     buPROPion  (WELLBUTRIN  XL) 300 MG 24 hr tablet Take 1 tablet (300 mg total) by mouth daily. Total of 450 mg daily. Take along with 150 mg tab 30 tablet 5   busPIRone  (BUSPAR ) 7.5 MG tablet Take 7.5 mg by mouth 2 (two) times daily.     calcium  carbonate (CALCIUM  600) 600 MG TABS tablet Take 1 tablet (600 mg total) by mouth daily. For low calcium  60 tablet 0   calcium  carbonate (TUMS) 500 MG chewable tablet Chew 500 mg by mouth daily.     carbidopa -levodopa  (SINEMET  IR) 25-100 MG tablet Take 2-3 tablets by mouth See admin instructions. Take 3 tablets in morning 2 tablet at bedtime (Patient taking differently: Take 1 tablet by mouth in the morning and at bedtime.)     clonazePAM  (KLONOPIN ) 1 MG tablet Take 1 tablet (1 mg total) by mouth 3 (three) times daily as needed for anxiety. 90 tablet 0    cyanocobalamin  (VITAMIN B12) 1000 MCG tablet Take 1,000 mcg by mouth daily.     fentaNYL  (DURAGESIC ) 12 MCG/HR Place 1 patch onto the skin every 3 (three) days. 10 patch 0   folic acid  (FOLVITE ) 1 MG tablet Take 1 tablet (1 mg total) by mouth daily. 30 tablet 3   gabapentin  (NEURONTIN ) 300 MG capsule Take 1 capsule (300 mg total) by mouth at bedtime. (Patient not taking: Reported on 12/22/2023) 30 capsule 3   ketoconazole (NIZORAL) 2 % shampoo Apply 1 application  topically every other day.     lidocaine -prilocaine  (EMLA ) cream Apply to  affected area once 30 g 3   magnesium  chloride (SLOW-MAG) 64 MG TBEC SR tablet Take 2 tablets (128 mg total) by mouth 2 (two) times daily. For low magnesium  120 tablet 00   metFORMIN  (GLUCOPHAGE ) 500 MG tablet Take 500 mg by mouth daily with breakfast.     Multiple Vitamin (MULTI-VITAMIN) tablet Take 1 tablet by mouth daily.     Nutritional Supplements (NUTREN 1.5) LIQD Give 1 carton in feeding tube via bolus syringe 5 times a day.  Flush with 60cc of water  before and 120cc of water  after.     ondansetron  (ZOFRAN ) 8 MG tablet Take 1 tablet (8 mg total) by mouth every 8 (eight) hours as needed for nausea or vomiting. Start on the third day after cisplatin . 30 tablet 1   Oxycodone  HCl 10 MG TABS Take 1 tablet (10 mg total) by mouth every 4 (four) hours as needed. 60 tablet 0   pantoprazole  (PROTONIX ) 40 MG tablet Take 1 tablet (40 mg total) by mouth daily. 30 tablet 3   polyethylene glycol (MIRALAX / GLYCOLAX) 17 g packet Take 17 g by mouth daily.     prochlorperazine  (COMPAZINE ) 10 MG tablet Take 1 tablet (10 mg total) by mouth every 6 (six) hours as needed for nausea or vomiting. 30 tablet 1   QUEtiapine  (SEROQUEL ) 50 MG tablet Take 1 tablet (50 mg total) by mouth at bedtime. (Patient not taking: Reported on 12/22/2023) 30 tablet 0   rosuvastatin  (CRESTOR ) 20 MG tablet Take 20 mg by mouth daily.     senna (SENOKOT) 8.6 MG tablet Take 1 tablet by mouth 2 (two) times  daily.     sertraline  (ZOLOFT ) 100 MG tablet Take 1.5 tablets (150 mg total) by mouth at bedtime. 45 tablet 3   sucralfate  (CARAFATE ) 1 g tablet Take 1 tablet (1 g total) by mouth 3 (three) times daily before meals. Dissolve in 4 tbs of warm water , swish and swallow 90 tablet 1   tadalafil (CIALIS) 5 MG tablet Take 5 mg by mouth daily.     tamsulosin (FLOMAX) 0.4 MG CAPS capsule Take 0.4 mg by mouth daily.     traZODone  (DESYREL ) 50 MG tablet Take 0.5-2 tablets (25-100 mg total) by mouth at bedtime as needed for sleep. (Patient not taking: Reported on 12/22/2023) 60 tablet 3   No current facility-administered medications for this visit.     Musculoskeletal: Strength & Muscle Tone: within normal limits Gait & Station: normal Patient leans: N/A  Psychiatric Specialty Exam: Review of Systems  There were no vitals taken for this visit.There is no height or weight on file to calculate BMI.  General Appearance: {Appearance:22683}  Eye Contact:  {BHH EYE CONTACT:22684}  Speech:  Clear and Coherent  Volume:  Normal  Mood:  {BHH MOOD:22306}  Affect:  {Affect (PAA):22687}  Thought Process:  Coherent  Orientation:  Full (Time, Place, and Person)  Thought Content: Logical   Suicidal Thoughts:  {ST/HT (PAA):22692}  Homicidal Thoughts:  {ST/HT (PAA):22692}  Memory:  Immediate;   Good  Judgement:  {Judgement (PAA):22694}  Insight:  {Insight (PAA):22695}  Psychomotor Activity:  Normal  Concentration:  Concentration: Good and Attention Span: Good  Recall:  Good  Fund of Knowledge: Good  Language: Good  Akathisia:  No  Handed:  Right  AIMS (if indicated): not done  Assets:  Communication Skills Desire for Improvement  ADL's:  Intact  Cognition: WNL  Sleep:  {BHH GOOD/FAIR/POOR:22877}   Screenings: [facility]  Treatment from 07/28/2021 in [facility]  MADRS Total Score 41   GAD-7    Flowsheet Row Counselor from 09/08/2023 in [facility] Office Visit from 02/25/2023 in [facility] Office Visit from 01/07/2023 in [facility] Office Visit from 10/12/2022 in [facility] Office Visit from 06/16/2022 in [facility]  Total GAD-7 Score 20 18 18 12 20    Mini-Mental    Flowsheet Row ECT Treatment from 07/28/2021 in [facility]  Total Score (max 30 points ) 30   PHQ2-9    Flowsheet Row Office Visit from 12/22/2023 in [facility] Of [place]. [facility] Office Visit from 11/26/2023 in [facility] Of [place]. [facility] Counselor from 09/08/2023 in [facility] Office Visit from 06/22/2023 in [facility] Of [place]. [facility] Office Visit from 02/25/2023 in [facility]  PHQ-2 Total Score 0 0 3 6 5   PHQ-9 Total Score -- -- 18 -- 17   Flowsheet Row Admission (Discharged) from 09/30/2023 in [facility] Counselor from 09/08/2023 in [facility] Office Visit from 03/31/2022 in [facility]  C-SSRS RISK CATEGORY No Risk No Risk No Risk     Assessment and Plan:  Ricky [name] is a 68 y.o. 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 diabetes, hypertension, hyperlipidemia, carpal tunnel syndrome, s/p knee arthroscopy , who presents for follow up appointment for below.    1. MDD (major depressive disorder), recurrent episode, mild (HCC) 2. 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 depression since 1979, ECT in 2023 with limited benefit, originally on duloxetine  60 mg daily, bupropion  150 mg daily Abilify  30 mg daily        Was on buspar  7.5 mg BID till 10/2023- d/c to avoid polypharmacy. Stimulant caused restlessness. Previously referred to [facility] Although he continues to report occasional down mood and anxiety, he denies significant concern, and was able to start therapy.  Although it was previously recommended to reduce the dose of quetiapine , he chose to discontinue the medication on his own. He later restarted it, believing it was helpful for managing his depression and insomnia.  Although this medication could be still attributable to restless leg given his previous report, will maintain on the current dose given he reports benefit from this medication, and its benefit for depression, anxiety, appetite, insomnia.  Will continue current dose of bupropion  to target depression, along with sertraline  to target depression and anxiety.  Will continue clonazepam  as needed for anxiety.    3. Insomnia, unspecified type 3. Fatigue, unspecified type 4. Obstructive sleep apnea # vitamin D  deficiency - According to the recent sleep study, he has severe obstructive sleep apnea, AHI of 31.  - Ritalin  5 mg did not make any difference, but cause adverse reaction of restlessness.  Overall improving.  Will continue quetiapine  to target insomnia, and was medication  as outlined above.    4. Restless leg syndrome He reports overall improvement since being on gabapentin , prescribed by the other provider.  Discussed potential risk of respiratory suppression, drowsiness especially concomitant use of clonazepam , opioid.    # benzodiazepine dependence (prescribed) - tapered down from 1 mg TID since April 2023, which was uptitrated back Feb  2025 No change. Although he was adjusting well to tapering down medication 2 years ago, his anxiety has worsened in the setting of cancer diagnosis.  He reports good benefit since uptitration without any side effects of drowsiness, oversedation or fall.  Both the patient and his wife is aware of potential risk of respiratory suppression with concomitant use of opioid.      5. Cognitive deficits Functional Status   IADL: Independent in the following:driving (advised to refrain from driving 05/7973)           Requires assistance with the following:  managing finances, medications (using pill box, his wife checks) ADL  Independent in the following: bathing and hygiene, feeding, continence, grooming and toileting, walking          Requires assistance with the following: Folate, Vitamin B12, TSH- low folic acid  08/2022. Otherwise wnl Images Brain MRI 08/2021: There is no evidence of an acute infarct, intracranial hemorrhage, mass, midline shift, or extra-axial fluid collection.Small T2 hyperintensities in the cerebral white matter bilaterally are nonspecific but compatible with mild chronic small vessel ischemic disease. Dilated perivascular spaces are noted in the basal ganglia bilaterally. There is mild generalized cerebral atrophy. Neuropsych assessment:  Etiology: parkinson, r/o VaD   He denies any subjective symptoms of memory loss.  However, his IADL is somewhat limited. Will continue to assess this.   Plan  Continue sertraline  150 mg at night Continue bupropion  300 mg daily - reduced 10/2023  Continue trazodone  100 mg at night as needed for insomnia Continue quetiapine  50 mg at night  Continue clonazepam  1 mg three times a day Next appointment-  8/21 at 11:30, IP They are advised to contact the sleep clinic regarding the result - on gabapentin  100 mg at night - TSH- 4.238 10/2022 -  He agrees that this Clinical research associate communicates with his provider, [employer], Fonda SAUNDERS, NP    Past trials of medication  (the following medication caused either side effect, or limited benefit): lexapro, duloxetine , venlafaxine, mirtazapine (rash), Abilify , quetiapine  (insomnia, RLS)   The patient demonstrates the following risk factors for suicide: Chronic risk factors for suicide include: psychiatric disorder of depression and chronic pain. Acute risk factors for suicide include: unemployment. Protective factors for this patient include: positive social support, coping skills and hope for the future. Considering these factors, the overall suicide risk at this point appears to be low. Patient is appropriate for outpatient follow up.  Collaboration of Care: Collaboration of Care: {BH OP Collaboration of Care:21014065}  Patient/Guardian was advised Release of Information must be obtained prior to any record release in order to collaborate their care with an outside provider. Patient/Guardian was advised if they have not already done so to contact the registration department to sign all necessary forms in order for us  to release information regarding their care.   Consent: Patient/Guardian gives verbal consent for treatment and assignment of benefits for services provided during this visit. Patient/Guardian expressed understanding and agreed to proceed.    Katheren Sleet, MD 12/23/2023, 2:16 PM

## 2023-12-23 NOTE — Telephone Encounter (Signed)
 Patient port to be removed.  Per Clarita Ricker [facility], Dr Karalee is here on Wed and I have an opening at 2p an arrive at 1:30p. This is done with local and he can drive himself. He would check in at the [facility] entrance. Thanks.  Spoke to spouse [name]; reviewed the above information.  [name] is in agreeance for next wed 8/20 for port removal.  No further questions / concerns at this time.

## 2023-12-25 ENCOUNTER — Other Ambulatory Visit: Payer: Self-pay | Admitting: Psychiatry

## 2023-12-28 NOTE — Progress Notes (Signed)
 Patient for IR Port Removal on Wed 12/29/23, I called and spoke with the patient on the phone and gave pre-procedure instructions. Pt was made aware to be here at 1:30p and check in at the new entrance. Pt stated understanding. Called 12/28/23

## 2023-12-29 ENCOUNTER — Encounter: Payer: Self-pay | Admitting: Oncology

## 2023-12-29 ENCOUNTER — Ambulatory Visit
Admission: RE | Admit: 2023-12-29 | Discharge: 2023-12-29 | Disposition: A | Discharge status: Home / Self Care | Source: Ambulatory Visit | Attending: Oncology | Admitting: Oncology

## 2023-12-29 ENCOUNTER — Telehealth: Payer: Self-pay

## 2023-12-29 DIAGNOSIS — Z452 Encounter for adjustment and management of vascular access device: Secondary | ICD-10-CM | POA: Insufficient documentation

## 2023-12-29 DIAGNOSIS — C109 Malignant neoplasm of oropharynx, unspecified: Secondary | ICD-10-CM | POA: Diagnosis not present

## 2023-12-29 HISTORY — PX: IR REMOVAL TUN ACCESS W/ PORT W/O FL MOD SED: IMG2290

## 2023-12-29 MED ORDER — LIDOCAINE-EPINEPHRINE 1 %-1:100000 IJ SOLN
10.0000 mL | Freq: Once | INTRAMUSCULAR | Status: AC
  Administered 2023-12-29: 5 mL via INTRADERMAL

## 2023-12-29 MED ORDER — LIDOCAINE-EPINEPHRINE 1 %-1:100000 IJ SOLN
INTRAMUSCULAR | Status: AC
  Filled 2023-12-29: qty 1

## 2023-12-29 NOTE — Telephone Encounter (Signed)
 pt wife called states that [facility] is still denying to visits for therapy. [facility] is requiring a prior auth for for therapy visit. [facility] has been call and pt wife has been called.

## 2023-12-30 ENCOUNTER — Encounter: Payer: Self-pay | Admitting: Hospice and Palliative Medicine

## 2023-12-30 ENCOUNTER — Ambulatory Visit: Admitting: Psychiatry

## 2023-12-30 ENCOUNTER — Encounter: Payer: Self-pay | Admitting: Oncology

## 2023-12-31 ENCOUNTER — Other Ambulatory Visit: Payer: Self-pay

## 2023-12-31 ENCOUNTER — Other Ambulatory Visit: Payer: Self-pay | Admitting: *Deleted

## 2023-12-31 ENCOUNTER — Encounter: Payer: Self-pay | Admitting: Oncology

## 2023-12-31 MED ORDER — PAXLOVID (150/100) 10 X 150 MG & 10 X 100MG PO TBPK
2.0000 | ORAL_TABLET | Freq: Two times a day (BID) | ORAL | 0 refills | Status: AC
  Filled 2023-12-31: qty 20, 5d supply, fill #0

## 2023-12-31 MED ORDER — BENZONATATE 100 MG PO CAPS
200.0000 mg | ORAL_CAPSULE | Freq: Three times a day (TID) | ORAL | 0 refills | Status: AC | PRN
  Filled 2023-12-31: qty 30, 5d supply, fill #0

## 2023-12-31 MED ORDER — PAXLOVID (150/100) 10 X 150 MG & 10 X 100MG PO TBPK: 2.0000 | ORAL_TABLET | Freq: Two times a day (BID) | ORAL | 0 refills | Status: DC

## 2023-12-31 NOTE — Telephone Encounter (Signed)
 Spoke to [place] at [facility] pharmacy who confirmed she has one left in stock of the 150/100; script sent to [facility] pharmacy.

## 2023-12-31 NOTE — Telephone Encounter (Signed)
 Called patient to make aware of the refill on file no answer left a voicemail for patient

## 2023-12-31 NOTE — Telephone Encounter (Signed)
 Per Dr. Melanee Paxlovid  150/100 bid 5 days.  Electronic script sent but transmitted as printed; outbound call to [facility].  Spoke to Amy and confirmed paxlovid  out of stock.

## 2023-12-31 NOTE — Telephone Encounter (Signed)
 Can you call him? benzonatate , 100 to 200 mg orally three times daily as needed.

## 2023-12-31 NOTE — Telephone Encounter (Signed)
 Paxlovid  150/100 bid 5 days

## 2023-12-31 NOTE — Telephone Encounter (Signed)
 There should be an order at the pharmacy. Please contact them to confirm, and update the patient once verified.

## 2024-01-03 ENCOUNTER — Ambulatory Visit (INDEPENDENT_AMBULATORY_CARE_PROVIDER_SITE_OTHER): Admitting: Surgery

## 2024-01-03 ENCOUNTER — Encounter: Payer: Self-pay | Admitting: Surgery

## 2024-01-03 ENCOUNTER — Other Ambulatory Visit: Payer: Self-pay | Admitting: Psychiatry

## 2024-01-03 VITALS — BP 122/69 | HR 76 | Temp 98.1°F | Ht 69.0 in | Wt 162.2 lb

## 2024-01-03 DIAGNOSIS — C109 Malignant neoplasm of oropharynx, unspecified: Secondary | ICD-10-CM | POA: Diagnosis not present

## 2024-01-03 NOTE — Telephone Encounter (Signed)
 Could yo contact to make a follow up. Virtual is fine if that works better for them

## 2024-01-03 NOTE — Patient Instructions (Signed)
 Removing a PEG Tube: What to Know After After your PEG tube is removed, you may feel sore at the opening in your skin where the tube was taken out (tube removal site). You may also have a small amount of drainage coming from the opening. Follow these instructions at home: Care of the tube removal site  Take care of the tube removal site as told. Make sure you: Wash your hands with soap and water  for at least 20 seconds before and after you change your bandage. If you can't use soap and water , use hand sanitizer. Change your bandage. Check the tube removal site every day for signs of infection. Check for: Redness, swelling, or pain. More fluid or blood. Warmth. Pus or a bad smell. Do not take baths, swim, or use a hot tub until you're told it's OK. Ask if you can shower. Activity Do not lift anything heavier than 10 lb (4.5 kg) until you're told it's OK. Ask what things are safe for you to do at home. Ask when you can go back to work or school. General instructions Take your medicines only as told. Eat and drink as told. Keep all follow-up visits. Your health care provider will check how you're healing. Contact a health care provider if: You have a fever. You have pain in your belly. You have any signs of infection. You feel sick to your stomach. You throw up. You can't eat or drink at all. Get help right away if: You have bleeding from your tube removal site, and the bleeding won't stop. You have really bad pain in your belly. This information is not intended to replace advice given to you by your health care provider. Make sure you discuss any questions you have with your health care provider. Document Revised: 12/01/2022 Document Reviewed: 12/01/2022 Elsevier Patient Education  2024 [employer].

## 2024-01-05 ENCOUNTER — Ambulatory Visit: Admitting: Licensed Clinical Social Worker

## 2024-01-05 NOTE — Progress Notes (Signed)
 Outpatient Surgical Follow Up    Ricky [name] is an 68 y.o. male.   Chief Complaint  Patient presents with   Routine Post Op    G tube placed 09/30/23    HPI:  Patient is 68 year old male seen for gastrostomy tube follow-up.  No fevers or chills.  He has completed chemotherapy and radiation and has a recent PET/CT done recently , I have personally reviewed showing no recurrence of disease .  Discussed with patient.   He is is eating and barely using g tube.   He has gained weight.   He is ready for G tube to be removed, they are fully aware that if he gets malnourished we will have to replace it.  Past Medical History:  Diagnosis Date   Anemia    Anxiety    Arthritis    Carpal tunnel syndrome    Depression    Diabetes (HCC)    Hyperlipidemia    Hypertension    Loss of teeth due to extraction    Neoplasm related pain    Parkinson's disease [facility])     Past Surgical History:  Procedure Laterality Date   CARPAL TUNNEL RELEASE Right 11/28/2020   Procedure: CARPAL TUNNEL RELEASE ENDOSCOPIC;  Surgeon: Edie Norleen PARAS, MD;  Location: [facility] ORS;  Service: Orthopedics;  Laterality: Right;   CARPAL TUNNEL RELEASE Left 01/22/2021   Procedure: CARPAL TUNNEL RELEASE ENDOSCOPIC;  Surgeon: Edie Norleen PARAS, MD;  Location: [facility] ORS;  Service: Orthopedics;  Laterality: Left;   INSERTION, GASTROSTOMY TUBE, ROBOT-ASSISTED N/A 09/30/2023   Procedure: INSERTION, GASTROSTOMY TUBE, ROBOT-ASSISTED;  Surgeon: Jordis Laneta FALCON, MD;  Location: [facility] ORS;  Service: General;  Laterality: N/A;   IR IMAGING GUIDED PORT INSERTION  06/30/2023   IR REMOVAL TUN ACCESS W/ PORT W/O FL MOD SED  12/29/2023   KNEE ARTHROSCOPY Right    KNEE ARTHROSCOPY Left    MANDIBLE FRACTURE SURGERY     as teenager    Family History  Problem Relation Age of Onset   Diabetes Mother    Hypertension Mother    CVA Father    Heart attack Father    Depression Sister    Depression Brother     Social History:  reports that he has never  smoked. He has never been exposed to tobacco smoke. He has never used smokeless tobacco. He reports that he does not currently use alcohol. He reports that he does not use drugs.  Allergies:  Allergies  Allergen Reactions   Mirtazapine Rash   Other Itching and Rash    ETCO2 tubing. Medtronic Microstream advance    Medications reviewed.    ROS Full ROS performed and is otherwise negative other than what is stated in HPI   BP 122/69   Pulse 76   Temp 98.1 F (36.7 C) (Oral)   Ht 5' 9 (1.753 m)   Wt 162 lb 3.2 oz (73.6 kg)   SpO2 97%   BMI 23.95 kg/m   Physical Exam Vitals and nursing note reviewed. Exam conducted with a chaperone present.  Constitutional:      Appearance: Normal appearance.  Abdominal:     General: Abdomen is flat. There is no distension.     Palpations: Abdomen is soft. There is no mass.     Tenderness: There is no abdominal tenderness. There is no guarding or rebound.     Hernia: No hernia is present.     Comments: G tube in place w/o infection or issues ,  removed w/o issues Skin:    Capillary Refill: Capillary refill takes less than 2 seconds.  Neurological:     General: No focal deficit present.     Mental Status: He is alert and oriented to person, place, and time.  Psychiatric:        Mood and Affect: Mood normal.        Behavior: Behavior normal.        Thought Content: Thought content normal.        Judgment: Judgment normal.    Assessment/Plan: Doing well g tube removed w/o complications RTC prn I personally spent a total of 30 minutes in the care of the patient today including performing a medically appropriate exam/evaluation, counseling and educating, placing orders, referring and communicating with other health care professionals, documenting clinical information in the EHR, independently interpreting and reviewing images studies and coordinating care.   Laneta Luna, MD [facility] General Surgeon

## 2024-01-06 NOTE — Telephone Encounter (Signed)
 Can you look into this?

## 2024-01-06 NOTE — Telephone Encounter (Signed)
 I left a VM for patient/significant other.

## 2024-01-07 ENCOUNTER — Telehealth: Payer: Self-pay

## 2024-01-07 NOTE — Telephone Encounter (Signed)
 Voicemail received from [name] Gull on 01/06/24 at 2:48PM stating that she was returning Mr. Sherleen call regarding this patient .  [name] asked for a call back [phone].  Forwarding note to Mr. Sherleen.

## 2024-01-07 NOTE — Telephone Encounter (Signed)
 I spoke with [name].  She says patient is sleeping a bit better but still having restless legs.  Note that iron panel was normal.  Patient was tried on gabapentin .  We discussed possible referral to neurology (Dr. Buckley?) if needed.  However, [name] says that she plans to have patient follow-up with his PCP due to recent COVID infection and plans to also address restless leg symptoms with PCP.

## 2024-01-11 ENCOUNTER — Other Ambulatory Visit: Payer: Self-pay

## 2024-01-13 ENCOUNTER — Encounter: Payer: Self-pay | Admitting: Oncology

## 2024-01-26 ENCOUNTER — Encounter: Payer: Self-pay | Admitting: Surgery

## 2024-01-26 ENCOUNTER — Ambulatory Visit (INDEPENDENT_AMBULATORY_CARE_PROVIDER_SITE_OTHER): Admitting: Surgery

## 2024-01-26 VITALS — BP 108/69 | HR 79 | Ht 69.0 in | Wt 164.6 lb

## 2024-01-26 DIAGNOSIS — T7849XA Other allergy, initial encounter: Secondary | ICD-10-CM

## 2024-01-26 NOTE — Patient Instructions (Addendum)
 May try using Tegaderm every 3-4 days or when it starts to come up on its own.    Do let us  know if you start to develop a fever, chills, or increased drainage.    Follow-up with our office in 3 weeks.   Please call and ask to speak with a nurse if you develop questions or concerns.

## 2024-01-27 NOTE — Progress Notes (Signed)
 Outpatient Surgical Follow Up  01/27/2024  Ricky Pastor Mitsuo Budnick. is an 68 y.o. male.   Chief Complaint  Patient presents with   Routine Post Op    HPI: Ricky [name] is following from a G-tube removal.  He noticed a rash around the insertion site and is concerned about a possible infection.  He is tolerating diet no fevers no chills he continues to gain weight and is stronger  Past Medical History:  Diagnosis Date   Anemia    Anxiety    Arthritis    Carpal tunnel syndrome    Depression    Diabetes (HCC)    Hyperlipidemia    Hypertension    Loss of teeth due to extraction    Neoplasm related pain    Parkinson's disease [facility])     Past Surgical History:  Procedure Laterality Date   CARPAL TUNNEL RELEASE Right 11/28/2020   Procedure: CARPAL TUNNEL RELEASE ENDOSCOPIC;  Surgeon: Edie Norleen PARAS, MD;  Location: [facility] ORS;  Service: Orthopedics;  Laterality: Right;   CARPAL TUNNEL RELEASE Left 01/22/2021   Procedure: CARPAL TUNNEL RELEASE ENDOSCOPIC;  Surgeon: Edie Norleen PARAS, MD;  Location: [facility] ORS;  Service: Orthopedics;  Laterality: Left;   INSERTION, GASTROSTOMY TUBE, ROBOT-ASSISTED N/A 09/30/2023   Procedure: INSERTION, GASTROSTOMY TUBE, ROBOT-ASSISTED;  Surgeon: Jordis Laneta FALCON, MD;  Location: [facility] ORS;  Service: General;  Laterality: N/A;   IR IMAGING GUIDED PORT INSERTION  06/30/2023   IR REMOVAL TUN ACCESS W/ PORT W/O FL MOD SED  12/29/2023   KNEE ARTHROSCOPY Right    KNEE ARTHROSCOPY Left    MANDIBLE FRACTURE SURGERY     as teenager    Family History  Problem Relation Age of Onset   Diabetes Mother    Hypertension Mother    CVA Father    Heart attack Father    Depression Sister    Depression Brother     Social History:  reports that he has never smoked. He has never been exposed to tobacco smoke. He has never used smokeless tobacco. He reports that he does not currently use alcohol. He reports that he does not use drugs.  Allergies:  Allergies  Allergen Reactions    Mirtazapine Rash   Other Itching and Rash    ETCO2 tubing. Medtronic Microstream advance    Medications reviewed.    ROS Full ROS performed and is otherwise negative other than what is stated in HPI   BP 108/69   Pulse 79   Ht 5' 9 (1.753 m)   Wt 164 lb 9.6 oz (74.7 kg)   SpO2 97%   BMI 24.31 kg/m   Physical Exam General He is not in no acute distress.  Awake and alert. Abdomen: Soft there is an area that is rectangular around G-tube exit site.  The wound is healing well without infection.  This is likely allergic reaction peritonitis   Assessment/Plan: 68 year old male status post G-tube removal now with a rash likely allergic from adhesive.  No evidence of infection no evidence of complications.  Discussed with patient detail about wound care. Be happy to follow him in a couple weeks I personally spent a total of 20 minutes in the care of the patient today including performing a medically appropriate exam/evaluation, counseling and educating, placing orders, referring and communicating with other health care professionals, documenting clinical information in the EHR, independently interpreting and reviewing images studies and coordinating care.   Laneta Jordis, MD [facility] General Surgeon

## 2024-02-01 NOTE — Progress Notes (Signed)
 [facility] MD/PA/NP OP Progress Note  02/07/2024 12:52 PM Ricky [name].  MRN:  [id number]  Chief Complaint:  Chief Complaint  Patient presents with   Follow-up   HPI:  This is a follow up appointment for depression, anxiety, insomnia.  He states that he has worsening in depression and anxiety. He is worried when he wakes up in the morning. He had COVID. He feels something is wrong with him, referring to issues with his leg to the head. 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 He has dysphasia, and has burning sensation after he eats. Food is not enjoyable anymore.  He also struggles with initial insomnia, which he partly attributes to restless leg.  He has not noticed any difference in restless leg since discontinuation of quetiapine , or gabapentin .  He denies SI, HI, hallucinations. He takes clonazepam  everyday for anxiety. He denies fall, drowsiness. He is not interested in TMS, but is willing to try Viibryd.  Ricky [name], his significant other presents to the visit.  She states that he is not doing anything during the day. His son and his family are in [place].  She stated that they initially thought the medication was helping, but it does not last.  He discontinued gabapentin  and quetiapine  due to these. They agree with the plans as outlined below.   Wt Readings from Last 3 Encounters:  02/07/24 168 lb 3.2 oz (76.3 kg)  01/26/24 164 lb 9.6 oz (74.7 kg)  01/03/24 162 lb 3.2 oz (73.6 kg)     Visit Diagnosis:    ICD-10-CM   1. MDD (major depressive disorder), recurrent episode, mild  F33.0     2. GAD (generalized anxiety disorder)  F41.1     3. Insomnia, unspecified type  G47.00       Past Psychiatric History: Please see initial evaluation for full details. I have reviewed the history. No updates at this time.     Past Medical History:  Past Medical History:  Diagnosis Date   Anemia    Anxiety    Arthritis    Carpal tunnel syndrome    Depression    Diabetes  (HCC)    Hyperlipidemia    Hypertension    Loss of teeth due to extraction    Neoplasm related pain    Parkinson's disease [facility])     Past Surgical History:  Procedure Laterality Date   CARPAL TUNNEL RELEASE Right 11/28/2020   Procedure: CARPAL TUNNEL RELEASE ENDOSCOPIC;  Surgeon: Edie Norleen PARAS, MD;  Location: [facility] ORS;  Service: Orthopedics;  Laterality: Right;   CARPAL TUNNEL RELEASE Left 01/22/2021   Procedure: CARPAL TUNNEL RELEASE ENDOSCOPIC;  Surgeon: Edie Norleen PARAS, MD;  Location: [facility] ORS;  Service: Orthopedics;  Laterality: Left;   INSERTION, GASTROSTOMY TUBE, ROBOT-ASSISTED N/A 09/30/2023   Procedure: INSERTION, GASTROSTOMY TUBE, ROBOT-ASSISTED;  Surgeon: Jordis Laneta FALCON, MD;  Location: [facility] ORS;  Service: General;  Laterality: N/A;   IR IMAGING GUIDED PORT INSERTION  06/30/2023   IR REMOVAL TUN ACCESS W/ PORT W/O FL MOD SED  12/29/2023   KNEE ARTHROSCOPY Right    KNEE ARTHROSCOPY Left    MANDIBLE FRACTURE SURGERY     as teenager    Family Psychiatric History: Please see initial evaluation for full details. I have reviewed the history. No updates at this time.     Family History:  Family History  Problem Relation Age of Onset   Diabetes Mother    Hypertension Mother    CVA Father  Heart attack Father    Depression Sister    Depression Brother     Social History:  Social History   Socioeconomic History   Marital status: Significant Other    Spouse name: Ricky [name]   Number of children: Not on file   Years of education: Not on file   Highest education level: Not on file  Occupational History   Not on file  Tobacco Use   Smoking status: Never    Passive exposure: Never   Smokeless tobacco: Never  Vaping Use   Vaping status: Never Used  Substance and Sexual Activity   Alcohol use: Not Currently   Drug use: Never   Sexual activity: Not Currently    Birth control/protection: None  Other Topics Concern   Not on file  Social History Narrative   Lives with  friend   Social Drivers of Health   Financial Resource Strain: Low Risk  (10/28/2023)   Received from [facility]   Overall Financial Resource Strain (CARDIA)    Difficulty of Paying Living Expenses: Not hard at all  Food Insecurity: No Food Insecurity (10/28/2023)   [facility]   Hunger Vital Sign    Within the past 12 months, you worried that your food would run out before you got the money to buy more.: Never true    Within the past 12 months, the food you bought just didn't last and you didn't have money to get more.: Never true  Transportation Needs: No Transportation Needs (10/28/2023)   Received from [facility] - Transportation    In the past 12 months, has lack of transportation kept you from medical appointments or from getting medications?: No    Lack of Transportation (Non-Medical): No  Physical Activity: Inactive (09/29/2023)   Exercise Vital Sign    Days of Exercise per Week: 0 days    Minutes of Exercise per Session: 0 min  Stress: No Stress Concern Present (09/29/2023)   [employer] of Occupational Health - Occupational Stress Questionnaire    Feeling of Stress : Not at all  Social Connections: Moderately Isolated (09/29/2023)   Social Connection and Isolation Panel    Frequency of Communication with Friends and Family: Three times a week    Frequency of Social Gatherings with Friends and Family: Twice a week    Attends Religious Services: Never    Database administrator or Organizations: No    Attends Banker Meetings: Never    Marital Status: Married    Allergies:  Allergies  Allergen Reactions   Mirtazapine Rash   Other Itching and Rash    ETCO2 tubing. Medtronic Microstream advance    Metabolic Disorder Labs: Lab Results  Component Value Date   HGBA1C 5.2 08/25/2021   MPG 102.54 08/25/2021   No results found for: PROLACTIN No results found for: CHOL,  TRIG, HDL, CHOLHDL, VLDL, LDLCALC Lab Results  Component Value Date   TSH 2.128 12/22/2023   TSH 2.016 09/14/2023    Therapeutic Level Labs: No results found for: LITHIUM No results found for: VALPROATE No results found for: CBMZ  Current Medications: Current Outpatient Medications  Medication Sig Dispense Refill   Vilazodone HCl (VIIBRYD) 10 MG TABS 5 mg daily for one week, then 10 mg daily 30 tablet 1   ACCU-CHEK GUIDE TEST test strip TEST 2X DAILY     Accu-Chek Softclix Lancets lancets 2 (two) times daily.  benzonatate  (TESSALON  PERLES) 100 MG capsule Take 2 capsules (200 mg total) by mouth 3 (three) times daily as needed for cough. 30 capsule 0   Blood Glucose Monitoring Suppl (ACCU-CHEK GUIDE ME) w/Device KIT See admin instructions.     buPROPion  (WELLBUTRIN  XL) 300 MG 24 hr tablet Take 1 tablet (300 mg total) by mouth daily. 30 tablet 5   busPIRone  (BUSPAR ) 7.5 MG tablet Take 7.5 mg by mouth 2 (two) times daily.     calcium  carbonate (CALCIUM  600) 600 MG TABS tablet Take 1 tablet (600 mg total) by mouth daily. For low calcium  60 tablet 0   calcium  carbonate (TUMS) 500 MG chewable tablet Chew 500 mg by mouth daily.     carbidopa -levodopa  (SINEMET  IR) 25-100 MG tablet Take 2-3 tablets by mouth See admin instructions. Take 3 tablets in morning 2 tablet at bedtime (Patient taking differently: Take 1 tablet by mouth in the morning and at bedtime.)     clonazePAM  (KLONOPIN ) 1 MG tablet Take 1 tablet (1 mg total) by mouth 3 (three) times daily as needed for anxiety. 90 tablet 1   cyanocobalamin  (VITAMIN B12) 1000 MCG tablet Take 1,000 mcg by mouth daily.     fentaNYL  (DURAGESIC ) 12 MCG/HR Place 1 patch onto the skin every 3 (three) days. 10 patch 0   folic acid  (FOLVITE ) 1 MG tablet Take 1 tablet (1 mg total) by mouth daily. 30 tablet 3   ketoconazole (NIZORAL) 2 % shampoo Apply 1 application  topically every other day.     lidocaine -prilocaine  (EMLA ) cream Apply to  affected area once 30 g 3   magnesium  chloride (SLOW-MAG) 64 MG TBEC SR tablet Take 2 tablets (128 mg total) by mouth 2 (two) times daily. For low magnesium  120 tablet 00   metFORMIN  (GLUCOPHAGE ) 500 MG tablet Take 500 mg by mouth daily with breakfast.     Multiple Vitamin (MULTI-VITAMIN) tablet Take 1 tablet by mouth daily.     Nutritional Supplements (NUTREN 1.5) LIQD Give 1 carton in feeding tube via bolus syringe 5 times a day.  Flush with 60cc of water  before and 120cc of water  after.     ondansetron  (ZOFRAN ) 8 MG tablet Take 1 tablet (8 mg total) by mouth every 8 (eight) hours as needed for nausea or vomiting. Start on the third day after cisplatin . 30 tablet 1   Oxycodone  HCl 10 MG TABS Take 1 tablet (10 mg total) by mouth every 4 (four) hours as needed. 60 tablet 0   pantoprazole  (PROTONIX ) 40 MG tablet Take 1 tablet (40 mg total) by mouth daily. 30 tablet 3   polyethylene glycol (MIRALAX / GLYCOLAX) 17 g packet Take 17 g by mouth daily.     prochlorperazine  (COMPAZINE ) 10 MG tablet Take 1 tablet (10 mg total) by mouth every 6 (six) hours as needed for nausea or vomiting. 30 tablet 1   rosuvastatin  (CRESTOR ) 20 MG tablet Take 20 mg by mouth daily.     senna (SENOKOT) 8.6 MG tablet Take 1 tablet by mouth 2 (two) times daily.     sertraline  (ZOLOFT ) 100 MG tablet Take 1.5 tablets (150 mg total) by mouth at bedtime. (Patient not taking: Reported on 02/07/2024) 45 tablet 3   sucralfate  (CARAFATE ) 1 g tablet Take 1 tablet (1 g total) by mouth 3 (three) times daily before meals. Dissolve in 4 tbs of warm water , swish and swallow 90 tablet 1   tadalafil (CIALIS) 5 MG tablet Take 5 mg by mouth daily.  tamsulosin (FLOMAX) 0.4 MG CAPS capsule Take 0.4 mg by mouth daily.     traZODone  (DESYREL ) 50 MG tablet Take 0.5-2 tablets (25-100 mg total) by mouth at bedtime as needed for sleep. 60 tablet 3   No current facility-administered medications for this visit.     Musculoskeletal: Strength &  Muscle Tone: within normal limits Gait & Station: normal Patient leans: N/A  Psychiatric Specialty Exam: Review of Systems  Psychiatric/Behavioral:  Positive for dysphoric mood and sleep disturbance. Negative for agitation, behavioral problems, confusion, decreased concentration, hallucinations, self-injury and suicidal ideas. The patient is nervous/anxious. The patient is not hyperactive.   All other systems reviewed and are negative.   Blood pressure 106/69, pulse 79, temperature (!) 97.4 F (36.3 C), temperature source Temporal, height 5' 9 (1.753 m), weight 168 lb 3.2 oz (76.3 kg).Body mass index is 24.84 kg/m.  General Appearance: Well Groomed  Eye Contact:  Good  Speech:  Clear and Coherent  Volume:  Normal  Mood:  Depressed  Affect:  Appropriate, Congruent, and down  Thought Process:  Coherent  Orientation:  Full (Time, Place, and Person)  Thought Content: Logical   Suicidal Thoughts:  No  Homicidal Thoughts:  No  Memory:  Immediate;   Good  Judgement:  Good  Insight:  Good  Psychomotor Activity:  Normal  Concentration:  Concentration: Good and Attention Span: Good  Recall:  Good  Fund of Knowledge: Good  Language: Good  Akathisia:  No  Handed:  Right  AIMS (if indicated): not done  Assets:  Communication Skills Desire for Improvement  ADL's:  Intact  Cognition: WNL  Sleep:  Poor   Screenings: ECT-MADRS    Flowsheet Row ECT Treatment from 07/28/2021 in [facility]  MADRS Total Score 41   GAD-7    Flowsheet Row Counselor from 09/08/2023 in [facility] Office Visit from 02/25/2023 in [facility] Office Visit from 01/07/2023 in [facility] Office Visit from 10/12/2022 in [facility] Office Visit from 06/16/2022 in [facility]  Total  GAD-7 Score 20 18 18 12 20    Mini-Mental    Flowsheet Row ECT Treatment from 07/28/2021 in [facility]  Total Score (max 30 points ) 30   PHQ2-9    Flowsheet Row Office Visit from 12/22/2023 in [facility] Of [place]. [facility] Office Visit from 11/26/2023 in [facility] Of [place]. [facility] Counselor from 09/08/2023 in [facility] Office Visit from 06/22/2023 in [facility] Dept Of [place]. [facility] Office Visit from 02/25/2023 in [facility]  PHQ-2 Total Score 0 0 3 6 5   PHQ-9 Total Score -- -- 18 -- 17   Flowsheet Row Admission (Discharged) from 09/30/2023 in [facility] Counselor from 09/08/2023 in [facility] Associates Office Visit from 03/31/2022 in [facility]  C-SSRS RISK CATEGORY No Risk No Risk No Risk     Assessment and Plan:  Ricky [name] is a 68 y.o. 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 diabetes, hypertension, hyperlipidemia, carpal tunnel syndrome, s/p knee  arthroscopy , who presents for follow up appointment for below.   1. MDD (major depressive disorder), recurrent episode, mild 2. 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 depression since 1979, ECT in 2023 with limited benefit, originally on duloxetine  60 mg daily, bupropion  150 mg daily Abilify  30 mg daily        Was on buspar  7.5 mg BID till 10/2023- d/c to avoid polypharmacy.  Stimulant caused restlessness. Previously referred to TMS The exam is notable for down affect, and he reports worsening in depressive symptoms and anxiety since the last visit.  He is concerned about the recurrence of cancer due to physical symptoms.  On a positive note, he has gained weight despite he has dysgeusia.  Will cross-taper from sertraline  to Viibryd to see if he has more benefit from this medication.  Discussed potential risk of nausea.  Noted that he discontinued quetiapine  due to perceived limited benefit.  Will not start antipsychotics at this time due to concern of parkinsonism.  It is noted that while it was discussed to consider TMS, and referral was already in place, he is not interested in this treatment at this time (and his wife reports difficulty in transportation).  Will continue clonazepam  as needed for anxiety.   3. Insomnia, unspecified type # OSA # vitamin D  deficiency - According to the recent sleep study, he has severe obstructive sleep apnea, AHI of 31.  - Ritalin  5 mg did not make any difference, but cause adverse reaction of restlessness.   There is worsening in initial insomnia.  Will try higher dose of trazodone  to optimize treatment for insomnia.  Noted that although he was diagnosed with sleep apnea, he is not interested in CPAP treatment.    4. Restless leg syndrome Unstable.  He discontinued gabapentin  due to perceived limited benefit.  He denies any difference since discontinuation of quetiapine .  Will try the intervention as outlined above to see if it improves his condition.   # benzodiazepine dependence (prescribed) - tapered down from 1 mg TID since April 2023, which was uptitrated back Feb 2025 No change. Although he was adjusting well to tapering down medication 2 years ago, his anxiety has worsened in the setting of cancer diagnosis.  He reports good benefit since uptitration without any side effects of drowsiness, oversedation or fall.  Both the patient  and his wife is aware of potential risk of respiratory suppression with concomitant use of opioid.    5. Cognitive deficits Functional Status   IADL: Independent in the following:driving (advised to refrain from driving [id number])           Requires assistance with the following:  managing finances, medications (using pill box, his wife checks) ADL  Independent in the following: bathing and hygiene, feeding, continence, grooming and toileting, walking          Requires assistance with the following: Folate, Vitamin B12, TSH- low folic acid  08/2022. Otherwise wnl Images Brain MRI 08/2021: There is no evidence of an acute infarct, intracranial hemorrhage, mass, midline shift, or extra-axial fluid collection.Small T2 hyperintensities in the cerebral white matter bilaterally are nonspecific but compatible with mild chronic small vessel ischemic disease. Dilated perivascular spaces are noted in the basal ganglia bilaterally. There is mild generalized cerebral atrophy. Neuropsych assessment:  Etiology: parkinson, r/o VaD  He denies any subjective symptoms of memory loss.  However, his IADL is somewhat limited. Will continue to assess this.   Plan  Decrease sertraline  100 mg for one week, then 50 mg daily for one week, then discontinue Start Viibryd 5 mg daily for one week, then increase 10 mg daily  Continue bupropion  300 mg daily - reduced 10/2023  Increase trazodone  150-200 mg at night as needed for insomnia (his wife will contact us  about the refill) Continue clonazepam  1 mg three times a day Next appointment-   11/24 at 10:30, IP They are advised to contact the sleep clinic regarding the result - TSH- 4.238 10/2022 -  He agrees that this Clinical research associate communicates with his provider, [employer], Fonda SAUNDERS, NP  - he discontinued quetiapine  a few weeks ago   Past trials of medication (the following medication caused either side effect, or limited benefit): lexapro, duloxetine , venlafaxine, mirtazapine (rash),  Abilify , quetiapine  (insomnia, RLS)   The patient demonstrates the following risk factors for suicide: Chronic risk factors for suicide include: psychiatric disorder of depression and chronic pain. Acute risk factors for suicide include: unemployment. Protective factors for this patient include: positive social support, coping skills and hope for the future. Considering these factors, the overall suicide risk at this point appears to be low. Patient is appropriate for outpatient follow up.  Collaboration of Care: Collaboration of Care: Other reviewed notes in Epic  Patient/Guardian was advised Release of Information must be obtained prior to any record release in order to collaborate their care with an outside provider. Patient/Guardian was advised if they have not already done so to contact the registration department to sign all necessary forms in order for us  to release information regarding their care.   Consent: Patient/Guardian gives verbal consent for treatment and assignment of benefits for services provided during this visit. Patient/Guardian expressed understanding and agreed to proceed.    Katheren Sleet, MD 02/07/2024, 12:52 PM

## 2024-02-07 ENCOUNTER — Ambulatory Visit: Admitting: Psychiatry

## 2024-02-07 ENCOUNTER — Encounter: Payer: Self-pay | Admitting: Psychiatry

## 2024-02-07 ENCOUNTER — Other Ambulatory Visit: Payer: Self-pay | Admitting: Psychiatry

## 2024-02-07 ENCOUNTER — Other Ambulatory Visit: Payer: Self-pay

## 2024-02-07 VITALS — BP 106/69 | HR 79 | Temp 97.4°F | Ht 69.0 in | Wt 168.2 lb

## 2024-02-07 DIAGNOSIS — G4700 Insomnia, unspecified: Secondary | ICD-10-CM

## 2024-02-07 DIAGNOSIS — F330 Major depressive disorder, recurrent, mild: Secondary | ICD-10-CM

## 2024-02-07 DIAGNOSIS — F411 Generalized anxiety disorder: Secondary | ICD-10-CM | POA: Diagnosis not present

## 2024-02-07 MED ORDER — VILAZODONE HCL 10 MG PO TABS: ORAL_TABLET | ORAL | 1 refills | Status: DC

## 2024-02-07 MED ORDER — CLONAZEPAM 1 MG PO TABS: 1.0000 mg | ORAL_TABLET | Freq: Three times a day (TID) | ORAL | 1 refills | Status: DC | PRN

## 2024-02-07 NOTE — Patient Instructions (Signed)
 Decrease sertraline  100 mg for one week, then 50 mg daily for one week, then discontinue Start Viibryd 5 mg daily for one week, then increase 10 mg daily  Continue bupropion  300 mg daily Increase trazodone  150-200 mg at night as needed for insomnia Continue clonazepam  1 mg three times a day Next appointment-   11/24 at 10:30

## 2024-02-08 ENCOUNTER — Other Ambulatory Visit: Payer: Self-pay | Admitting: Psychiatry

## 2024-02-09 ENCOUNTER — Ambulatory Visit: Admitting: Licensed Clinical Social Worker

## 2024-02-09 DIAGNOSIS — G4700 Insomnia, unspecified: Secondary | ICD-10-CM | POA: Diagnosis not present

## 2024-02-09 DIAGNOSIS — F411 Generalized anxiety disorder: Secondary | ICD-10-CM | POA: Diagnosis not present

## 2024-02-09 DIAGNOSIS — F330 Major depressive disorder, recurrent, mild: Secondary | ICD-10-CM | POA: Diagnosis not present

## 2024-02-09 NOTE — Progress Notes (Signed)
 THERAPIST PROGRESS NOTE  Virtual Visit via Video Note  I connected with Ricky [name]. on 02/09/24 at 11:00 AM EDT by a video enabled telemedicine application and verified that I am speaking with the correct person using two identifiers.  Location: Patient: Address on file   Provider: Providers Address   I discussed the limitations of evaluation and management by telemedicine and the availability of in person appointments. The patient expressed understanding and agreed to proceed.   I discussed the assessment and treatment plan with the patient. The patient was provided an opportunity to ask questions and all were answered. The patient agreed with the plan and demonstrated an understanding of the instructions.   The patient was advised to call back or seek an in-person evaluation if the symptoms worsen or if the condition fails to improve as anticipated.  I provided 45 minutes of non-face-to-face time during this encounter.   Evalene KATHEE Husband, LCSW   Session Time: 11-11:45am  Participation Level: Active  Behavioral Response: CasualAlertEuthymic  Type of Therapy: Individual Therapy  Treatment Goals addressed:  Active     Anxiety     LTG: Report a decrease in anxiety symptoms as evidenced by an overall reduction in anxiety score by a minimum of 25% on the Generalized Anxiety Disorder Scale (GAD-7)     Start:  09/08/23    Expected End:  03/09/24         STG - Misc 1     Start:  09/08/23    Expected End:  03/09/24      Identify and Challenge Three Negative Thoughts: Become aware of distorted or unhelpful thinking patterns and replace them with more balanced and constructive thoughts.      Perform psychoeducation regarding anxiety disorders     Start:  09/08/23         Work with patient individually to identify the major components of a recent episode of anxiety: physical symptoms, major thoughts and images, and major behaviors they experienced      Start:  09/08/23         Coping Skills     Start:  09/08/23       Will work with the pt using CBT/DBT techniques to help the pt verbalize an understanding of the cognitive, physiological, and behavioral components of anxiety and its treatment. This will be done by using worksheets, interactive activities, CBT/ABC thought logs, modeling, homework, role playing and journaling. Will work with pt to learn and implement coping skills that result in a reduction of anxiety and improve daily functioning per pt self-report 3 out of 5 documented sessions.         OP Depression     LTG: Reduce frequency, intensity, and duration of depression symptoms so that daily functioning is improved     Start:  09/08/23    Expected End:  03/09/24         LTG: Increase coping skills to manage depression and improve ability to perform daily activities     Start:  09/08/23    Expected End:  03/09/24         STG: Ricky Ferraris will identify cognitive patterns and beliefs that support depression     Start:  09/08/23    Expected End:  03/09/24         LTG: To be normal [people being happy, getting around, seeing my son again, communicating with others] like everybody else.     Start:  09/08/23  Expected End:  03/09/24         Work with Ricky Ferraris to identify the major components of a recent episode of depression: physical symptoms, major thoughts and images, and major behaviors they experienced     Start:  09/08/23         Coping Skills      Start:  09/08/23       Will work with the pt using CBT/DBT techniques to help the pt verbalize an understanding of the cognitive, physiological, and behavioral components of depression and its treatment. This will be done by using worksheets, interactive activities, CBT/ABC thought logs, modeling, homework, role playing and journaling. Will work with pt to learn and implement coping skills that result in a reduction of depression and improve daily  functioning per pt self-report 3 out of 5 documented sessions.          ProgressTowards Goals: Progressing  Interventions: Solution Focused and Supportive CBT Reframing   Summary: Ricky [name] is a 68 y.o. male who presents with lack of motivation, anhedonia, feelings of anxiety, tension, a negative outlook, and uncontrollable worry. Pt was oriented times 5. Pt was cooperative and engaged. Pt denies SI/HI/AVH.      Reports his cancer treatment is making him depressed. Shares he changed his antidepressant to help him sleep but reports he did not sleep at all the night prior. Reports his legs bother him from his diabetes due to them getting cold and restless. Shares hope as he is going to visit with his doctor later this month to help with restless legs.   Shares his anxiety has not been too bad. Reports he feels his depression is overwhelming. Denies panic attacks due to taking his clonopin. Reports he still experiences worrisome thoughts. Explored worries related to his health. States his throat is sore from radiation and he was hoping for more progress. Reports his PET scan was clear of cancer anywhere else. Cln worked with patient to challenge negative thoughts related to his health anxiety.   Reports he often is frustrated because his taste nor smell has returned from COVID. However, he reports his appetite has been okay.   Denies use of coping skills to help manage his symptoms. Reports the few times he has tried them, he feels they have not worked.   Cln encouraged the patient to get 30 minutes of sunshine a day to help with his circadian clock. Reports he is not getting into bed 12-1am. Reports he will often lay in bed awake. Attempted to problem solve his sleep hygiene.   Explored some positive activities patient can engage in to assist with his mood. Reports an interest to go to sport stores, get outside, call a friend, and going for drives.   Suicidal/Homicidal: Nowithout  intent/plan  Therapist Response: Clinician utilized active and supportive reflection to create a safe space for patient to process recent events. Clinician assessed for current symptoms, stressors, seeking support session.  Worked through CBT to assist in reframing negative thoughts. Attempted to explore possible solutions to the patients sleep difficulty as well as things he can incorporate into his routine to improve mood.    Plan: Return again in 4 weeks.  Diagnosis: MDD (major depressive disorder), recurrent episode, mild  GAD (generalized anxiety disorder)  Insomnia, unspecified type   Collaboration of Care: AEB psychiatrist can access notes and cln. Will review psychiatrists' notes. Check in with the patient and will see LCSW per availability. Patient agreed with treatment recommendations.  Patient/Guardian was advised Release of Information must be obtained prior to any record release in order to collaborate their care with an outside provider. Patient/Guardian was advised if they have not already done so to contact the registration department to sign all necessary forms in order for us  to release information regarding their care.   Consent: Patient/Guardian gives verbal consent for treatment and assignment of benefits for services provided during this visit. Patient/Guardian expressed understanding and agreed to proceed.   Evalene KATHEE Husband, LCSW 02/09/2024

## 2024-02-16 ENCOUNTER — Ambulatory Visit (INDEPENDENT_AMBULATORY_CARE_PROVIDER_SITE_OTHER): Admitting: Surgery

## 2024-02-16 ENCOUNTER — Encounter: Payer: Self-pay | Admitting: Surgery

## 2024-02-16 VITALS — BP 118/73 | HR 68 | Temp 97.8°F | Ht 69.0 in | Wt 165.0 lb

## 2024-02-16 DIAGNOSIS — L929 Granulomatous disorder of the skin and subcutaneous tissue, unspecified: Secondary | ICD-10-CM | POA: Diagnosis not present

## 2024-02-16 DIAGNOSIS — C109 Malignant neoplasm of oropharynx, unspecified: Secondary | ICD-10-CM | POA: Diagnosis not present

## 2024-02-16 NOTE — Patient Instructions (Signed)
 Do let us  know if you start to develop a fever, chills, or increased drainage.      Follow-up with our office in 1 month     Please call and ask to speak with a nurse if you develop questions or concerns.

## 2024-02-17 NOTE — Progress Notes (Signed)
 Outpatient Surgical Follow Up  02/17/2024  Ricky [name]. is an 68 y.o. male.   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 Some chromic wound issues at  g tube exit site. No fevers or chills. Gaining weight, ambulating  Past Medical History:  Diagnosis Date   Anemia    Anxiety    Arthritis    Carpal tunnel syndrome    Depression    Diabetes (HCC)    Hyperlipidemia    Hypertension    Loss of teeth due to extraction    Neoplasm related pain    Parkinson's disease [facility])     Past Surgical History:  Procedure Laterality Date   CARPAL TUNNEL RELEASE Right 11/28/2020   Procedure: CARPAL TUNNEL RELEASE ENDOSCOPIC;  Surgeon: Edie Norleen PARAS, MD;  Location: [facility] ORS;  Service: Orthopedics;  Laterality: Right;   CARPAL TUNNEL RELEASE Left 01/22/2021   Procedure: CARPAL TUNNEL RELEASE ENDOSCOPIC;  Surgeon: Edie Norleen PARAS, MD;  Location: [facility] ORS;  Service: Orthopedics;  Laterality: Left;   INSERTION, GASTROSTOMY TUBE, ROBOT-ASSISTED N/A 09/30/2023   Procedure: INSERTION, GASTROSTOMY TUBE, ROBOT-ASSISTED;  Surgeon: Jordis Laneta FALCON, MD;  Location: [facility] ORS;  Service: General;  Laterality: N/A;   IR IMAGING GUIDED PORT INSERTION  06/30/2023   IR REMOVAL TUN ACCESS W/ PORT W/O FL MOD SED  12/29/2023   KNEE ARTHROSCOPY Right    KNEE ARTHROSCOPY Left    MANDIBLE FRACTURE SURGERY     as teenager    Family History  Problem Relation Age of Onset   Diabetes Mother    Hypertension Mother    CVA Father    Heart attack Father    Depression Sister    Depression Brother     Social History:  reports that he has never smoked. He has never been exposed to tobacco smoke. He has never used smokeless tobacco. He reports that he does not currently use alcohol. He reports that he does not use drugs.  Allergies:  Allergies  Allergen Reactions   Mirtazapine Rash   Other Itching and Rash    ETCO2 tubing. Medtronic  Microstream advance    Medications reviewed.    ROS Full ROS performed and is otherwise negative other than what is stated in HPI   BP 118/73   Pulse 68   Temp 97.8 F (36.6 C) (Oral)   Ht 5' 9 (1.753 m)   Wt 165 lb (74.8 kg)   SpO2 99%   BMI 24.37 kg/m   Physical Exam Vitals and nursing note reviewed. Exam conducted with a chaperone present.  Constitutional:      Appearance: Normal appearance.  Pulmonary:     Effort: Pulmonary effort is normal.     Breath sounds: No stridor.  Abdominal:     General: There is no distension.     Palpations: Abdomen is soft. There is no mass.     Tenderness: There is no abdominal tenderness. There is no guarding.     Hernia: No hernia is present.     Comments: G-tube exit site with some hypergranulation tissue.  Silver nitrate was used with good results.  Sterile dressing placed  Skin:    General: Skin is warm and dry.     Capillary Refill: Capillary refill takes less than 2 seconds.  Neurological:     General: No focal deficit present.     Mental Status: He is alert and  oriented to person, place, and time.  Psychiatric:        Mood and Affect: Mood normal.        Behavior: Behavior normal.        Thought Content: Thought content normal.        Judgment: Judgment normal.       Assessment/Plan:  This is a 68 year old status post G-tube placement and removal.  Doing well.  Small granulation tissue around the G-tube site not unexpected.  May do daily dressing changes.  No evidence of infection May return to the office in a couple months or so I personally spent a total of 20 minutes in the care of the patient today including performing a medically appropriate exam/evaluation, counseling and educating, placing orders, referring and communicating with other health care professionals, documenting clinical information in the EHR, independently interpreting and reviewing images studies and coordinating care.   Laneta Luna, MD  [facility] General Surgeon

## 2024-02-22 ENCOUNTER — Other Ambulatory Visit: Payer: Self-pay | Admitting: Hospice and Palliative Medicine

## 2024-02-23 ENCOUNTER — Other Ambulatory Visit: Payer: Self-pay | Admitting: Psychiatry

## 2024-03-02 NOTE — Progress Notes (Signed)
 Discharge Plan Member has been discharged from [facility]. Please see below Discharge Plan for details. The Member has been provided a copy of their Discharge Plan via the [facility] Care portal. If you have any questions, please contact our Clinical Team at [phone].  Name: Ricky [name] Date of Birth: [birth year] Discharge Date: 2024-03-02 Discharged Reason: Not Engaged Referring Provider: Fonda Mower Psychiatric Medication Recommendation Transition: N/A (no CC med recs made) Referrals: No referrals Discharge Summary Member canceled his initial behavioral health evaluation visit. We made several attempts to reschedule but were unable to reach him.  [place] Care Provider: Kedric Severin - Health Coach

## 2024-03-10 ENCOUNTER — Encounter: Payer: Self-pay | Admitting: Oncology

## 2024-03-10 ENCOUNTER — Telehealth: Payer: Self-pay | Admitting: Psychiatry

## 2024-03-10 NOTE — Telephone Encounter (Signed)
 Could you contact the pharmacy. There is a refill, and please ask them to fill it today, and notify the patient/[name].

## 2024-03-10 NOTE — Telephone Encounter (Signed)
 Called the pharmacy spoke to [facility] and she stated that she could fill that for the patient today called [name] and made her aware that the medication would be filled today she voiced understanding

## 2024-03-13 ENCOUNTER — Encounter: Payer: Self-pay | Admitting: Internal Medicine

## 2024-03-14 ENCOUNTER — Other Ambulatory Visit: Payer: Self-pay | Admitting: Internal Medicine

## 2024-03-14 ENCOUNTER — Encounter: Payer: Self-pay | Admitting: Licensed Clinical Social Worker

## 2024-03-14 DIAGNOSIS — N6320 Unspecified lump in the left breast, unspecified quadrant: Secondary | ICD-10-CM

## 2024-03-15 ENCOUNTER — Other Ambulatory Visit: Payer: Self-pay | Admitting: Oncology

## 2024-03-15 ENCOUNTER — Encounter: Payer: Self-pay | Admitting: Surgery

## 2024-03-15 ENCOUNTER — Ambulatory Visit: Admitting: Licensed Clinical Social Worker

## 2024-03-15 ENCOUNTER — Ambulatory Visit: Admitting: Surgery

## 2024-03-15 VITALS — BP 126/76 | HR 71 | Temp 98.0°F | Ht 69.0 in | Wt 168.8 lb

## 2024-03-15 DIAGNOSIS — N62 Hypertrophy of breast: Secondary | ICD-10-CM | POA: Diagnosis not present

## 2024-03-15 DIAGNOSIS — C109 Malignant neoplasm of oropharynx, unspecified: Secondary | ICD-10-CM

## 2024-03-15 DIAGNOSIS — F411 Generalized anxiety disorder: Secondary | ICD-10-CM

## 2024-03-15 DIAGNOSIS — G4700 Insomnia, unspecified: Secondary | ICD-10-CM | POA: Diagnosis not present

## 2024-03-15 DIAGNOSIS — F330 Major depressive disorder, recurrent, mild: Secondary | ICD-10-CM

## 2024-03-15 NOTE — Patient Instructions (Signed)
 Removing a PEG Tube: What to Expect A PEG tube (percutaneous endoscopic gastrostomy tube) is a feeding tube that is used to put food, medicine, and fluids straight into your stomach. It is placed into your stomach through a small cut in your belly. You may have a PEG tube if: You have trouble swallowing. You don't feel hungry, but you need to eat more. You haven't been able to eat and drink enough to keep your body healthy. When you don't need the PEG tube anymore, your health care provider will do a simple procedure to take it out. Tell your health care provider about: Any allergies you have. All medicines you take. These include vitamins, herbs, eye drops, and creams. Any problems you or family members have had with anesthesia. Any bleeding problems you have. Any surgeries you've had. Any medical conditions you have. Whether you're pregnant or may be pregnant. What are the risks? Your provider will talk with you about risks. These may include: Infection. Bleeding. Damage to nearby structures or organs, such as the stomach, intestines, or liver. Leaking of stomach contents. Allergies to medicines. What happens before the procedure? Ask about changing or stopping: Any medicines you take. Any vitamins, herbs, or supplements you take. Do not take aspirin or ibuprofen unless you're told to. Eat and drink as told. Do not smoke, vape, or use nicotine or tobacco for at least 4 weeks before the procedure. What happens during the procedure?  You'll lie on your back with your belly exposed. The area around the PEG tube will be cleaned with a soap that kills germs. You may be given anesthesia to keep you from feeling pain. If you have an inflatable balloon at the end of your PEG tube: A syringe will be used to deflate the balloon. Your provider will pull out the tube. You may feel some pressure or stinging. A bandage will be placed. These steps may vary. Ask what you can expect. What  happens after the procedure? You'll be watched closely until you leave. This includes checking your pain level, blood pressure, heart rate, and breathing rate. This information is not intended to replace advice given to you by your health care provider. Make sure you discuss any questions you have with your health care provider. Document Revised: 12/01/2022 Document Reviewed: 12/01/2022 Elsevier Patient Education  2024 [employer].

## 2024-03-15 NOTE — Progress Notes (Signed)
 THERAPIST PROGRESS NOTE  Session Time: 11:03-11:59am  Participation Level: Active  Behavioral Response: CasualAlertAnxious and flat  Type of Therapy: Individual Therapy  Treatment Goals addressed:  Active     Anxiety     LTG: Report a decrease in anxiety symptoms as evidenced by an overall reduction in anxiety score by a minimum of 25% on the Generalized Anxiety Disorder Scale (GAD-7)     Start:  09/08/23    Expected End:  03/09/24         STG - Misc 1     Start:  09/08/23    Expected End:  03/09/24      Identify and Challenge Three Negative Thoughts: Become aware of distorted or unhelpful thinking patterns and replace them with more balanced and constructive thoughts.      Perform psychoeducation regarding anxiety disorders     Start:  09/08/23         Work with patient individually to identify the major components of a recent episode of anxiety: physical symptoms, major thoughts and images, and major behaviors they experienced     Start:  09/08/23         Coping Skills     Start:  09/08/23       Will work with the pt using CBT/DBT techniques to help the pt verbalize an understanding of the cognitive, physiological, and behavioral components of anxiety and its treatment. This will be done by using worksheets, interactive activities, CBT/ABC thought logs, modeling, homework, role playing and journaling. Will work with pt to learn and implement coping skills that result in a reduction of anxiety and improve daily functioning per pt self-report 3 out of 5 documented sessions.         OP Depression     LTG: Reduce frequency, intensity, and duration of depression symptoms so that daily functioning is improved     Start:  09/08/23    Expected End:  03/09/24         LTG: Increase coping skills to manage depression and improve ability to perform daily activities     Start:  09/08/23    Expected End:  03/09/24         STG: [name] will identify cognitive  patterns and beliefs that support depression     Start:  09/08/23    Expected End:  03/09/24         LTG: To be normal [people being happy, getting around, seeing my son again, communicating with others] like everybody else.     Start:  09/08/23    Expected End:  03/09/24         Work with [name] to identify the major components of a recent episode of depression: physical symptoms, major thoughts and images, and major behaviors they experienced     Start:  09/08/23         Coping Skills      Start:  09/08/23       Will work with the pt using CBT/DBT techniques to help the pt verbalize an understanding of the cognitive, physiological, and behavioral components of depression and its treatment. This will be done by using worksheets, interactive activities, CBT/ABC thought logs, modeling, homework, role playing and journaling. Will work with pt to learn and implement coping skills that result in a reduction of depression and improve daily functioning per pt self-report 3 out of 5 documented sessions.          ProgressTowards Goals: Not Progressing  Interventions: CBT, Supportive, Reframing, and Other: Mindfulness  Summary: Ricky [name] is a 68 y.o. male who presents with lack of motivation, anhedonia, feelings of anxiety, tension, a negative outlook, and uncontrollable worry. Pt was oriented times 5. Pt was cooperative and engaged. Pt denies SI/HI/AVH.      Patient utilized therapeutic space to process frustration with lack of sleep reporting he only received 4 hours of sleep last night due to worrying about a lump in his chest.  The patient reports he feels his sleep is a hit or miss due to ever-changing medication regimens as a result of complications with sleep medications.  Patient reports his anxiety remains about the same citing he is worried about everything but his anxiety worsens with complications medically.  Patient denies presentation of white coat syndrome  reporting he no longer experiences anxiety with doctors visits or hospital settings.  Patient reflected on frustrations believing his Parkinson's is worsening and his muscles are weak since cancer feeling like he cannot engage with his son or grandson.  However, patient reports he is hopeful about beginning physical therapy.  Patient reports he is self-conscious about his low muscle mass.  Patient reports he is frustrated with living in unhealthy lifestyle citing he wants to give up on med management and mental health treatment.  Shares the only thing that has helped in the past has been Klonopin .  Patient reports he recognizes he is occasionally unpleasant to be around due to increased irritability.  Clinician assessed for triggers for anxiety with patient identifying the fear of the unknown his whole life and need for control.  Reports he copes by mowing his yard, doing things around the house, and helping his mother.  Cln worked with patient on mindfulness techniques educating him on bilateral tapping and constructing his peaceful place. The patient reported he had tried to sue his peaceful place int he past and had no success. Reports the bilateral tapping was not effective either. Patient reports he has also tried to use breathing techniques explored in previous session but they have not aided in decreasing his anxiousness.   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  Clinician explored patient's understanding of genetic testing for possible medication changes.  Patient reports he has not inquired about genetic testing but will mention it to his psychiatrist and PCP.   Suicidal/Homicidal: Nowithout intent/plan  Therapist Response: Clinician utilized active and supportive reflection to create a safe space for patient to process recent events. Clinician  assessed for current symptoms, stressors, seeking support session.  Worked with patient through CBT to reframe negative cognitions about progress.  Attempted to identify possible solutions for coping.  Plan: Return again in 4 weeks.  Diagnosis: MDD (major depressive disorder), recurrent episode, mild  GAD (generalized anxiety disorder)  Insomnia, unspecified type   Collaboration of Care: AEB psychiatrist can access notes and cln. Will review psychiatrists' notes. Check in with the patient and will see LCSW per availability. Patient agreed with treatment recommendations.   Patient/Guardian was advised Release of Information must be obtained prior to any record release in order to collaborate their care with an outside provider. Patient/Guardian was advised if they have not already done so to contact the registration department to sign all necessary forms in order for us  to release information regarding their care.   Consent: Patient/Guardian gives verbal consent for treatment and assignment of benefits  for services provided during this visit. Patient/Guardian expressed understanding and agreed to proceed.   Evalene KATHEE Husband, LCSW 03/15/2024

## 2024-03-15 NOTE — Progress Notes (Signed)
 Patient ID: Ricky [name]., male   DOB: 1955/06/26, 68 y.o.   MRN: [id number]  HPI Ricky [name]. is a 68 y.o. 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 G tube site healing well. No fevers or chills. Gaining weight, ambulating  Does have some taste issues  He dopes have a new left breast growth w some intermittent pain, moderate and sharp CT pet pers reviewed no evidence of breast uptake HPI  Past Medical History:  Diagnosis Date   Anemia    Anxiety    Arthritis    Carpal tunnel syndrome    Depression    Diabetes (HCC)    Hyperlipidemia    Hypertension    Loss of teeth due to extraction    Neoplasm related pain    Parkinson's disease [facility])     Past Surgical History:  Procedure Laterality Date   CARPAL TUNNEL RELEASE Right 11/28/2020   Procedure: CARPAL TUNNEL RELEASE ENDOSCOPIC;  Surgeon: Edie Norleen PARAS, MD;  Location: [facility] ORS;  Service: Orthopedics;  Laterality: Right;   CARPAL TUNNEL RELEASE Left 01/22/2021   Procedure: CARPAL TUNNEL RELEASE ENDOSCOPIC;  Surgeon: Edie Norleen PARAS, MD;  Location: [facility] ORS;  Service: Orthopedics;  Laterality: Left;   INSERTION, GASTROSTOMY TUBE, ROBOT-ASSISTED N/A 09/30/2023   Procedure: INSERTION, GASTROSTOMY TUBE, ROBOT-ASSISTED;  Surgeon: Jordis Laneta FALCON, MD;  Location: [facility] ORS;  Service: General;  Laterality: N/A;   IR IMAGING GUIDED PORT INSERTION  06/30/2023   IR REMOVAL TUN ACCESS W/ PORT W/O FL MOD SED  12/29/2023   KNEE ARTHROSCOPY Right    KNEE ARTHROSCOPY Left    MANDIBLE FRACTURE SURGERY     as teenager    Family History  Problem Relation Age of Onset   Diabetes Mother    Hypertension Mother    CVA Father    Heart attack Father    Depression Sister    Depression Brother     Social History Social History   Tobacco Use   Smoking status: Never    Passive exposure: Never   Smokeless tobacco: Never  Vaping Use   Vaping status: Never Used  Substance Use Topics   Alcohol use: Not Currently    Drug use: Never    Allergies  Allergen Reactions   Mirtazapine Rash   Other Itching and Rash    ETCO2 tubing. Medtronic Microstream advance    Current Outpatient Medications  Medication Sig Dispense Refill   ACCU-CHEK GUIDE TEST test strip TEST 2X DAILY     Accu-Chek Softclix Lancets lancets 2 (two) times daily.     benzonatate  (TESSALON  PERLES) 100 MG capsule Take 2 capsules (200 mg total) by mouth 3 (three) times daily as needed for cough. 30 capsule 0   Blood Glucose Monitoring Suppl (ACCU-CHEK GUIDE ME) w/Device KIT See admin instructions.     buPROPion  (WELLBUTRIN  XL) 300 MG 24 hr tablet Take 1 tablet (300 mg total) by mouth daily. 30 tablet 5   busPIRone  (BUSPAR ) 7.5 MG tablet Take 7.5 mg by mouth 2 (two) times daily.     calcium  carbonate (CALCIUM  600) 600 MG TABS tablet Take 1 tablet (600 mg total) by mouth daily. For low calcium  60 tablet 0   calcium  carbonate (TUMS) 500 MG chewable tablet Chew 500 mg by mouth daily.     carbidopa -levodopa  (SINEMET  IR) 25-100 MG tablet Take 2-3 tablets by mouth See admin instructions. Take 3 tablets in morning 2 tablet at bedtime (Patient taking differently: Take  1 tablet by mouth in the morning and at bedtime.)     clonazePAM  (KLONOPIN ) 1 MG tablet Take 1 tablet (1 mg total) by mouth 3 (three) times daily as needed for anxiety. 90 tablet 1   cyanocobalamin  (VITAMIN B12) 1000 MCG tablet Take 1,000 mcg by mouth daily.     fentaNYL  (DURAGESIC ) 12 MCG/HR Place 1 patch onto the skin every 3 (three) days. 10 patch 0   folic acid  (FOLVITE ) 1 MG tablet TAKE 1 TABLET(1 MG) BY MOUTH DAILY 30 tablet 3   ketoconazole (NIZORAL) 2 % shampoo Apply 1 application  topically every other day.     lidocaine -prilocaine  (EMLA ) cream Apply to affected area once 30 g 3   magnesium  chloride (SLOW-MAG) 64 MG TBEC SR tablet Take 2 tablets (128 mg total) by mouth 2 (two) times daily. For low magnesium  120 tablet 00   metFORMIN  (GLUCOPHAGE ) 500 MG tablet Take 500 mg by  mouth daily with breakfast.     Multiple Vitamin (MULTI-VITAMIN) tablet Take 1 tablet by mouth daily.     Nutritional Supplements (NUTREN 1.5) LIQD Give 1 carton in feeding tube via bolus syringe 5 times a day.  Flush with 60cc of water  before and 120cc of water  after.     ondansetron  (ZOFRAN ) 8 MG tablet Take 1 tablet (8 mg total) by mouth every 8 (eight) hours as needed for nausea or vomiting. Start on the third day after cisplatin . 30 tablet 1   Oxycodone  HCl 10 MG TABS Take 1 tablet (10 mg total) by mouth every 4 (four) hours as needed. 60 tablet 0   pantoprazole  (PROTONIX ) 40 MG tablet TAKE 1 TABLET(40 MG) BY MOUTH DAILY 90 tablet 2   polyethylene glycol (MIRALAX / GLYCOLAX) 17 g packet Take 17 g by mouth daily.     prochlorperazine  (COMPAZINE ) 10 MG tablet Take 1 tablet (10 mg total) by mouth every 6 (six) hours as needed for nausea or vomiting. 30 tablet 1   rosuvastatin  (CRESTOR ) 20 MG tablet Take 20 mg by mouth daily.     senna (SENOKOT) 8.6 MG tablet Take 1 tablet by mouth 2 (two) times daily.     sertraline  (ZOLOFT ) 100 MG tablet Take 1.5 tablets (150 mg total) by mouth at bedtime. 45 tablet 3   sucralfate  (CARAFATE ) 1 g tablet Take 1 tablet (1 g total) by mouth 3 (three) times daily before meals. Dissolve in 4 tbs of warm water , swish and swallow 90 tablet 1   tadalafil (CIALIS) 5 MG tablet Take 5 mg by mouth daily.     tamsulosin (FLOMAX) 0.4 MG CAPS capsule Take 0.4 mg by mouth daily.     traZODone  (DESYREL ) 50 MG tablet Take 0.5-2 tablets (25-100 mg total) by mouth at bedtime as needed for sleep. 60 tablet 3   No current facility-administered medications for this visit.     Review of Systems Full ROS  was asked and was negative except for the information on the HPI  Physical Exam Blood pressure 126/76, pulse 71, temperature 98 F (36.7 C), temperature source Oral, height 5' 9 (1.753 m), weight 168 lb 12.8 oz (76.6 kg), SpO2 99%. CONSTITUTIONAL: NAD. SKIN: Turgor is good and  there are no pathologic skin lesions or ulcers. Breast: left gynecomastia w 3x3 cm glandular retro areolar tissues , no LAD no skin or nipple involvement ABd: soft, nt, healed g tube site, no infection or rebound  NEUROLOGIC: Motor and sensation is grossly normal. Cranial nerves are grossly intact. PSYCH:  Oriented to person, place and time. Affect is normal.  Data Reviewed I have personally reviewed the patient's imaging, laboratory findings and medical records.    Assessment/Plan Gynecomastia, agree w u/s and mamo, may need biopsy pending imaging results. From g tube perspective this is healed. We will be available in case he may need excisional biopsy of breast lesion I personally spent a total of 30 minutes in the care of the patient today including performing a medically appropriate exam/evaluation, counseling and educating, placing orders, referring and communicating with other health care professionals, documenting clinical information in the EHR, independently interpreting and reviewing images studies and coordinating care.    Laneta Luna, MD FACS General Surgeon 03/15/2024, 2:15 PM

## 2024-03-16 ENCOUNTER — Ambulatory Visit
Admission: RE | Admit: 2024-03-16 | Discharge: 2024-03-16 | Disposition: A | Discharge status: Home / Self Care | Source: Ambulatory Visit | Attending: Internal Medicine | Admitting: Internal Medicine

## 2024-03-16 DIAGNOSIS — N62 Hypertrophy of breast: Secondary | ICD-10-CM | POA: Diagnosis not present

## 2024-03-16 DIAGNOSIS — N6320 Unspecified lump in the left breast, unspecified quadrant: Secondary | ICD-10-CM

## 2024-03-22 ENCOUNTER — Encounter: Payer: Self-pay | Admitting: Oncology

## 2024-03-22 ENCOUNTER — Inpatient Hospital Stay: Attending: Oncology

## 2024-03-22 ENCOUNTER — Inpatient Hospital Stay (HOSPITAL_BASED_OUTPATIENT_CLINIC_OR_DEPARTMENT_OTHER): Admitting: Oncology

## 2024-03-22 VITALS — BP 123/78 | HR 80 | Temp 97.6°F | Resp 19 | Ht 69.0 in | Wt 167.0 lb

## 2024-03-22 DIAGNOSIS — D6959 Other secondary thrombocytopenia: Secondary | ICD-10-CM

## 2024-03-22 DIAGNOSIS — I129 Hypertensive chronic kidney disease with stage 1 through stage 4 chronic kidney disease, or unspecified chronic kidney disease: Secondary | ICD-10-CM | POA: Insufficient documentation

## 2024-03-22 DIAGNOSIS — T451X5A Adverse effect of antineoplastic and immunosuppressive drugs, initial encounter: Secondary | ICD-10-CM | POA: Diagnosis not present

## 2024-03-22 DIAGNOSIS — G20A1 Parkinson's disease without dyskinesia, without mention of fluctuations: Secondary | ICD-10-CM | POA: Diagnosis not present

## 2024-03-22 DIAGNOSIS — C109 Malignant neoplasm of oropharynx, unspecified: Secondary | ICD-10-CM

## 2024-03-22 DIAGNOSIS — Z85818 Personal history of malignant neoplasm of other sites of lip, oral cavity, and pharynx: Secondary | ICD-10-CM | POA: Diagnosis present

## 2024-03-22 DIAGNOSIS — E1122 Type 2 diabetes mellitus with diabetic chronic kidney disease: Secondary | ICD-10-CM | POA: Diagnosis not present

## 2024-03-22 DIAGNOSIS — R438 Other disturbances of smell and taste: Secondary | ICD-10-CM | POA: Insufficient documentation

## 2024-03-22 DIAGNOSIS — N189 Chronic kidney disease, unspecified: Secondary | ICD-10-CM | POA: Insufficient documentation

## 2024-03-22 DIAGNOSIS — R1310 Dysphagia, unspecified: Secondary | ICD-10-CM | POA: Diagnosis not present

## 2024-03-22 DIAGNOSIS — D701 Agranulocytosis secondary to cancer chemotherapy: Secondary | ICD-10-CM | POA: Insufficient documentation

## 2024-03-22 LAB — CBC WITH DIFFERENTIAL (CANCER CENTER ONLY)
Abs Immature Granulocytes: 0.01 K/uL (ref 0.00–0.07)
Basophils Absolute: 0 K/uL (ref 0.0–0.1)
Basophils Relative: 1 %
Eosinophils Absolute: 0.1 K/uL (ref 0.0–0.5)
Eosinophils Relative: 2 %
HCT: 32.8 % — ABNORMAL LOW (ref 39.0–52.0)
Hemoglobin: 11.7 g/dL — ABNORMAL LOW (ref 13.0–17.0)
Immature Granulocytes: 0 %
Lymphocytes Relative: 18 %
Lymphs Abs: 0.6 K/uL — ABNORMAL LOW (ref 0.7–4.0)
MCH: 31 pg (ref 26.0–34.0)
MCHC: 35.7 g/dL (ref 30.0–36.0)
MCV: 86.8 fL (ref 80.0–100.0)
Monocytes Absolute: 0.3 K/uL (ref 0.1–1.0)
Monocytes Relative: 8 %
Neutro Abs: 2.4 K/uL (ref 1.7–7.7)
Neutrophils Relative %: 71 %
Platelet Count: 97 K/uL — ABNORMAL LOW (ref 150–400)
RBC: 3.78 MIL/uL — ABNORMAL LOW (ref 4.22–5.81)
RDW: 12.6 % (ref 11.5–15.5)
WBC Count: 3.3 K/uL — ABNORMAL LOW (ref 4.0–10.5)
nRBC: 0 % (ref 0.0–0.2)

## 2024-03-22 LAB — CMP (CANCER CENTER ONLY)
ALT: 7 U/L (ref 0–44)
AST: 24 U/L (ref 15–41)
Albumin: 4.5 g/dL (ref 3.5–5.0)
Alkaline Phosphatase: 35 U/L — ABNORMAL LOW (ref 38–126)
Anion gap: 10 (ref 5–15)
BUN: 27 mg/dL — ABNORMAL HIGH (ref 8–23)
CO2: 25 mmol/L (ref 22–32)
Calcium: 9.2 mg/dL (ref 8.9–10.3)
Chloride: 101 mmol/L (ref 98–111)
Creatinine: 1.49 mg/dL — ABNORMAL HIGH (ref 0.61–1.24)
GFR, Estimated: 51 mL/min — ABNORMAL LOW (ref >60)
Glucose, Bld: 158 mg/dL — ABNORMAL HIGH (ref 70–99)
Potassium: 4.2 mmol/L (ref 3.5–5.1)
Sodium: 136 mmol/L (ref 135–145)
Total Bilirubin: 1 mg/dL (ref 0.0–1.2)
Total Protein: 7.1 g/dL (ref 6.5–8.1)

## 2024-03-22 NOTE — Progress Notes (Signed)
 Patient states his throat is giving him some swallowing issues & making it hard to eat. Also, he is wondering when will his next PET scan will be & if it is possible to get one before the end of the year.

## 2024-03-22 NOTE — Progress Notes (Signed)
 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 He was ultimately referred By his dentist to ENT and underwent CT soft tissue neck which showed a 4 cm partially necrotic mass at the glossotonsillar sulcus on the right side and to level 2 lymph nodes measuring 15 and 14 mm that appeared pathologic. Core needle biopsy of the level 2 lymph node was consistent with squamous cell carcinoma. Cells diffusely positive for p40 and p16.  Patient completed concurrent chemoradiation with weekly cisplatin  in May 2025.  Treatment complicated by cytopenias as well as malnutrition requiring PEG tube placement PET CT scanAfter treatment completion in July 2025 showed complete metabolic response oropharyngeal primary and ipsilateral nodal metastases.  Possible cirrhotic morphology of the liver.  Patient has had mild leukopenia and thrombocytopenia since completing chemotherapy  Interval history- Discussed the use of AI scribe software for clinical note transcription with the patient, who gave verbal consent to proceed.  The patient, with head and neck cancer and Parkinson's disease, presents with difficulty swallowing and weight loss.  He has experienced difficulty swallowing, particularly with  bread, requiring water  to help push food down. He feels as though food gets stuck in his throat. He has lost two to three pounds over the last month.  He has a history of head and neck cancer treated with radiation and chemotherapy. He has not had a follow-up with an ENT since the initial treatment. He experiences dry mouth, which he attributes to radiation therapy.  He has Parkinson's disease, which may contribute to his swallowing difficulties. His sense of smell and taste have diminished, and these symptoms have worsened over the past month or two.  His hemoglobin was 9 four months ago, increased to 11.2 three months ago, and is currently 11.7. His creatinine was 1.37 three months ago, 1.4 in October, and is currently 1.49.      ECOG PS- 1 Pain scale- 0   Review of systems- Review of Systems  Constitutional:  Negative for chills, fever, malaise/fatigue and weight loss.  HENT:  Negative for congestion, ear discharge and nosebleeds.   Eyes:  Negative for blurred vision.  Respiratory:  Negative for cough, hemoptysis, sputum production, shortness of breath and wheezing.   Cardiovascular:  Negative for chest pain, palpitations, orthopnea and claudication.  Gastrointestinal:  Negative for abdominal pain, blood in stool, constipation, diarrhea, heartburn, melena, nausea and vomiting.       Dysphagia  Genitourinary:  Negative for dysuria, flank pain, frequency, hematuria and urgency.  Musculoskeletal:  Negative for back pain, joint pain and myalgias.  Skin:  Negative for rash.  Neurological:  Negative for dizziness, tingling, focal weakness, seizures, weakness and headaches.  Endo/Heme/Allergies:  Does not bruise/bleed easily.  Psychiatric/Behavioral:  Negative for depression and suicidal ideas. The patient does not have insomnia.  Allergies  Allergen Reactions   Mirtazapine Rash   Other Itching and Rash    ETCO2 tubing. Medtronic Microstream advance     Past Medical History:   Diagnosis Date   Anemia    Anxiety    Arthritis    Carpal tunnel syndrome    Depression    Diabetes (HCC)    Hyperlipidemia    Hypertension    Loss of teeth due to extraction    Neoplasm related pain    Parkinson's disease [facility])      Past Surgical History:  Procedure Laterality Date   CARPAL TUNNEL RELEASE Right 11/28/2020   Procedure: CARPAL TUNNEL RELEASE ENDOSCOPIC;  Surgeon: Edie Norleen PARAS, MD;  Location: [facility] ORS;  Service: Orthopedics;  Laterality: Right;   CARPAL TUNNEL RELEASE Left 01/22/2021   Procedure: CARPAL TUNNEL RELEASE ENDOSCOPIC;  Surgeon: Edie Norleen PARAS, MD;  Location: [facility] ORS;  Service: Orthopedics;  Laterality: Left;   INSERTION, GASTROSTOMY TUBE, ROBOT-ASSISTED N/A 09/30/2023   Procedure: INSERTION, GASTROSTOMY TUBE, ROBOT-ASSISTED;  Surgeon: Jordis Laneta FALCON, MD;  Location: [facility] ORS;  Service: General;  Laterality: N/A;   IR IMAGING GUIDED PORT INSERTION  06/30/2023   IR REMOVAL TUN ACCESS W/ PORT W/O FL MOD SED  12/29/2023   KNEE ARTHROSCOPY Right    KNEE ARTHROSCOPY Left    MANDIBLE FRACTURE SURGERY     as teenager    Social History   Socioeconomic History   Marital status: Significant Other    Spouse name: [name]   Number of children: Not on file   Years of education: Not on file   Highest education level: Not on file  Occupational History   Not on file  Tobacco Use   Smoking status: Never    Passive exposure: Never   Smokeless tobacco: Never  Vaping Use   Vaping status: Never Used  Substance and Sexual Activity   Alcohol use: Not Currently   Drug use: Never   Sexual activity: Not Currently    Birth control/protection: None  Other Topics Concern   Not on file  Social History Narrative   Lives with friend   Social Drivers of Health   Financial Resource Strain: High Risk (02/26/2024)   Received from [facility]   Overall Financial Resource Strain (CARDIA)    Difficulty of Paying Living Expenses: Hard  Food  Insecurity: Patient Declined (02/26/2024)   Received from [facility]   Hunger Vital Sign    Within the past 12 months, you worried that your food would run out before you got the money to buy more.: Patient declined    Within the past 12 months, the food you bought just didn't last and you didn't have money to get more.: Patient declined  Transportation Needs: No Transportation Needs (02/26/2024)   Received from [facility] - Transportation    In the past 12 months, has lack of transportation kept you from medical appointments or from getting medications?: No    Lack of Transportation (Non-Medical): No  Physical Activity: Inactive (09/29/2023)   Exercise Vital Sign    Days of Exercise per Week: 0 days    Minutes of Exercise per Session: 0 min  Stress: No Stress Concern Present (09/29/2023)   [employer] of [employer] - Occupational Stress Questionnaire    Feeling of Stress : Not at all  Social Connections: Moderately Isolated (09/29/2023)   Social Connection and Isolation Panel    Frequency of Communication  with Friends and Family: Three times a week    Frequency of Social Gatherings with Friends and Family: Twice a week    Attends Religious Services: Never    Database Administrator or Organizations: No    Attends Banker Meetings: Never    Marital Status: Married  Catering Manager Violence: Not At Risk (06/22/2023)   Humiliation, Afraid, Rape, and Kick questionnaire    Fear of Current or Ex-Partner: No    Emotionally Abused: No    Physically Abused: No    Sexually Abused: No    Family History  Problem Relation Age of Onset   Diabetes Mother    Hypertension Mother    CVA Father    Heart attack Father    Depression Sister    Depression Brother      Current Outpatient Medications:    ACCU-CHEK GUIDE TEST test strip, TEST 2X DAILY, Disp: , Rfl:    Accu-Chek Softclix Lancets lancets, 2 (two) times daily.,  Disp: , Rfl:    benzonatate  (TESSALON  PERLES) 100 MG capsule, Take 2 capsules (200 mg total) by mouth 3 (three) times daily as needed for cough., Disp: 30 capsule, Rfl: 0   Blood Glucose Monitoring Suppl (ACCU-CHEK GUIDE ME) w/Device KIT, See admin instructions., Disp: , Rfl:    buPROPion  (WELLBUTRIN  XL) 300 MG 24 hr tablet, Take 1 tablet (300 mg total) by mouth daily., Disp: 30 tablet, Rfl: 5   busPIRone  (BUSPAR ) 7.5 MG tablet, Take 7.5 mg by mouth 2 (two) times daily., Disp: , Rfl:    calcium  carbonate (CALCIUM  600) 600 MG TABS tablet, Take 1 tablet (600 mg total) by mouth daily. For low calcium , Disp: 60 tablet, Rfl: 0   carbidopa -levodopa  (SINEMET  IR) 25-100 MG tablet, Take 2-3 tablets by mouth See admin instructions. Take 3 tablets in morning 2 tablet at bedtime (Patient taking differently: Take 1 tablet by mouth in the morning and at bedtime.), Disp: , Rfl:    clonazePAM  (KLONOPIN ) 1 MG tablet, Take 1 tablet (1 mg total) by mouth 3 (three) times daily as needed for anxiety., Disp: 90 tablet, Rfl: 1   cyanocobalamin  (VITAMIN B12) 1000 MCG tablet, Take 1,000 mcg by mouth daily., Disp: , Rfl:    fentaNYL  (DURAGESIC ) 12 MCG/HR, Place 1 patch onto the skin every 3 (three) days., Disp: 10 patch, Rfl: 0   folic acid  (FOLVITE ) 1 MG tablet, TAKE 1 TABLET(1 MG) BY MOUTH DAILY, Disp: 30 tablet, Rfl: 3   ketoconazole (NIZORAL) 2 % shampoo, Apply 1 application  topically every other day., Disp: , Rfl:    levothyroxine (SYNTHROID) 50 MCG tablet, Take 50 mcg by mouth., Disp: , Rfl:    lidocaine -prilocaine  (EMLA ) cream, Apply to affected area once, Disp: 30 g, Rfl: 3   metFORMIN  (GLUCOPHAGE ) 500 MG tablet, Take 500 mg by mouth daily with breakfast., Disp: , Rfl:    Multiple Vitamin (MULTI-VITAMIN) tablet, Take 1 tablet by mouth daily., Disp: , Rfl:    Nutritional Supplements (NUTREN 1.5) LIQD, Give 1 carton in feeding tube via bolus syringe 5 times a day.  Flush with 60cc of water  before and 120cc of water   after., Disp: , Rfl:    ondansetron  (ZOFRAN ) 8 MG tablet, Take 1 tablet (8 mg total) by mouth every 8 (eight) hours as needed for nausea or vomiting. Start on the third day after cisplatin ., Disp: 30 tablet, Rfl: 1   Oxycodone  HCl 10 MG TABS, Take 1 tablet (10 mg total) by mouth every 4 (  four) hours as needed., Disp: 60 tablet, Rfl: 0   pantoprazole  (PROTONIX ) 40 MG tablet, TAKE 1 TABLET(40 MG) BY MOUTH DAILY, Disp: 90 tablet, Rfl: 2   polyethylene glycol (MIRALAX / GLYCOLAX) 17 g packet, Take 17 g by mouth daily., Disp: , Rfl:    prochlorperazine  (COMPAZINE ) 10 MG tablet, Take 1 tablet (10 mg total) by mouth every 6 (six) hours as needed for nausea or vomiting., Disp: 30 tablet, Rfl: 1   rosuvastatin  (CRESTOR ) 20 MG tablet, Take 20 mg by mouth daily., Disp: , Rfl:    senna (SENOKOT) 8.6 MG tablet, Take 1 tablet by mouth 2 (two) times daily., Disp: , Rfl:    sertraline  (ZOLOFT ) 100 MG tablet, Take 1.5 tablets (150 mg total) by mouth at bedtime., Disp: 45 tablet, Rfl: 3   sucralfate  (CARAFATE ) 1 g tablet, Take 1 tablet (1 g total) by mouth 3 (three) times daily before meals. Dissolve in 4 tbs of warm water , swish and swallow, Disp: 90 tablet, Rfl: 1   tadalafil (CIALIS) 5 MG tablet, Take 5 mg by mouth daily., Disp: , Rfl:    tamsulosin (FLOMAX) 0.4 MG CAPS capsule, Take 0.4 mg by mouth daily., Disp: , Rfl:    traZODone  (DESYREL ) 50 MG tablet, Take 0.5-2 tablets (25-100 mg total) by mouth at bedtime as needed for sleep., Disp: 60 tablet, Rfl: 3   calcium  carbonate (TUMS) 500 MG chewable tablet, Chew 500 mg by mouth daily., Disp: , Rfl:    gabapentin  (NEURONTIN ) 300 MG capsule, Take 300 mg by mouth daily. (Patient not taking: Reported on 03/22/2024), Disp: , Rfl:    magnesium  chloride (SLOW-MAG) 64 MG TBEC SR tablet, Take 2 tablets (128 mg total) by mouth 2 (two) times daily. For low magnesium  (Patient not taking: Reported on 03/22/2024), Disp: 120 tablet, Rfl: 00  Physical exam:  Vitals:   03/22/24  1033  BP: 123/78  Pulse: 80  Resp: 19  Temp: 97.6 F (36.4 C)  TempSrc: Tympanic  SpO2: 100%  Weight: 167 lb (75.8 kg)  Height: 5' 9 (1.753 m)   Physical Exam HENT:     Mouth/Throat:     Mouth: Mucous membranes are moist.     Pharynx: Oropharynx is clear.  Eyes:     Pupils: Pupils are equal, round, and reactive to light.  Cardiovascular:     Rate and Rhythm: Normal rate and regular rhythm.     Heart sounds: Normal heart sounds.  Pulmonary:     Effort: Pulmonary effort is normal.     Breath sounds: Normal breath sounds.  Abdominal:     General: Bowel sounds are normal.     Palpations: Abdomen is soft.  Lymphadenopathy:     Comments: No palpable cervical adenopathy  Skin:    General: Skin is warm and dry.  Neurological:     Mental Status: He is alert and oriented to person, place, and time.      I have personally reviewed labs listed below:    Latest Ref Rng & Units 03/22/2024    9:29 AM  CMP  Glucose 70 - 99 mg/dL 841   BUN 8 - 23 mg/dL 27   Creatinine 9.38 - 1.24 mg/dL 8.50   Sodium 864 - 854 mmol/L 136   Potassium 3.5 - 5.1 mmol/L 4.2   Chloride 98 - 111 mmol/L 101   CO2 22 - 32 mmol/L 25   Calcium  8.9 - 10.3 mg/dL 9.2   Total Protein 6.5 - 8.1 g/dL 7.1  Total Bilirubin 0.0 - 1.2 mg/dL 1.0   Alkaline Phos 38 - 126 U/L 35   AST 15 - 41 U/L 24   ALT 0 - 44 U/L 7       Latest Ref Rng & Units 03/22/2024    9:29 AM  CBC  WBC 4.0 - 10.5 K/uL 3.3   Hemoglobin 13.0 - 17.0 g/dL 88.2   Hematocrit 60.9 - 52.0 % 32.8   Platelets 150 - 400 K/uL 97    I have personally reviewed Radiology images listed below: No images are attached to the encounter.  MM 3D DIAGNOSTIC MAMMOGRAM BILATERAL BREAST Result Date: 03/16/2024 CLINICAL DATA:  LEFT breast palpable abnormality x1 month, subjectively increasing EXAM: DIGITAL DIAGNOSTIC BILATERAL MAMMOGRAM WITH TOMOSYNTHESIS AND CAD TECHNIQUE: Bilateral digital diagnostic mammography and breast tomosynthesis was performed.  The images were evaluated with computer-aided detection. COMPARISON:  Previous exam(s). ACR Breast Density Category b: There are scattered areas of fibroglandular density. FINDINGS: LEFT-greater-than-RIGHT flame shaped densities compatible with benign gynecomastia. There is no mammographic evidence of malignancy in EITHER breast. IMPRESSION: Benign gynecomastia, LEFT greater than RIGHT. Gynecomastia is common and can occur with changes in the testosterone:estrogen ratio. Potential causes of gynecomastia include numerous prescription medications, dietary supplements, anabolic steroids, and recreational drugs, particularly marijuana. Other causes include hormone secreting testicular or pituitary tumors. Gynecomastia can be related to other medical problems, such as kidney, thyroid , or liver disease. Gynecomastia often resolves on its own. RECOMMENDATION: Clinical follow-up for benign LEFT greater than RIGHT gynecomastia to evaluate for potentially reversible causes. I have discussed the findings and recommendations with the patient. If applicable, a reminder letter will be sent to the patient regarding the next appointment. BI-RADS CATEGORY  2: Benign. Electronically Signed   By: Norleen Croak M.D.   On: 03/16/2024 15:16     Assessment and plan- Patient is a 68 y.o. 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 No routine CT or PET scans needed as previous PET scan was clear, indicating remission. ENT follow-up is necessary for periodic checkups to monitor for recurrence. - Refer back to ENT for periodic checkups for head and neck cancer surveillance.  Dysphagia Difficulty  swallowing, particularly with bread, requiring water  to aid swallowing. Possible causes include radiation effects, chemotherapy, or Parkinson's disease. Differential diagnosis includes esophageal stricture or other structural issues. - Referred to gastroenterologist for evaluation and possible endoscopy.  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 White blood cell count is 3.2 and platelets are in the 90s, considered a new baseline post-chemotherapy. No immediate intervention required as these levels are not harmful.  Chronic kidney disease, unspecified Chronic kidney disease with fluctuating kidney function tests. Current creatinine level is 1.49, consistent with previous levels. Kidney function is stable and monitored by primary care physician. - Continue monitoring kidney function with primary care physician.    Visit Diagnosis 1. Squamous cell carcinoma of oropharynx (HCC)   2. Chemotherapy-induced thrombocytopenia      Dr. Annah Skene, MD, MPH [facility] at [facility] [phone] 03/22/2024 2:03 PM

## 2024-03-27 ENCOUNTER — Ambulatory Visit: Attending: Internal Medicine | Admitting: Physical Therapy

## 2024-03-27 DIAGNOSIS — G20C Parkinsonism, unspecified: Secondary | ICD-10-CM | POA: Diagnosis present

## 2024-03-27 DIAGNOSIS — R2689 Other abnormalities of gait and mobility: Secondary | ICD-10-CM | POA: Diagnosis present

## 2024-03-27 DIAGNOSIS — M6281 Muscle weakness (generalized): Secondary | ICD-10-CM | POA: Diagnosis present

## 2024-03-27 NOTE — Therapy (Unsigned)
 OUTPATIENT PHYSICAL THERAPY LOWER EXTREMITY EVALUATION  Patient Name: Ricky [name]. MRN: [id number] DOB:Jul 08, 1955, 68 y.o., male Today's Date: 03/27/2024  END OF SESSION:  PT End of Session - 03/27/24 1133     Visit Number 1    Number of Visits 12    Date for Recertification  05/08/24    PT Start Time 0900    PT Stop Time 0945    PT Time Calculation (min) 45 min    Activity Tolerance Patient tolerated treatment well;No increased pain    Behavior During Therapy WFL for tasks assessed/performed          Past Medical History:  Diagnosis Date   Anemia    Anxiety    Arthritis    Carpal tunnel syndrome    Depression    Diabetes (HCC)    Hyperlipidemia    Hypertension    Loss of teeth due to extraction    Neoplasm related pain    Parkinson's disease [facility])    Past Surgical History:  Procedure Laterality Date   CARPAL TUNNEL RELEASE Right 11/28/2020   Procedure: CARPAL TUNNEL RELEASE ENDOSCOPIC;  Surgeon: Edie Norleen PARAS, MD;  Location: [facility] ORS;  Service: Orthopedics;  Laterality: Right;   CARPAL TUNNEL RELEASE Left 01/22/2021   Procedure: CARPAL TUNNEL RELEASE ENDOSCOPIC;  Surgeon: Edie Norleen PARAS, MD;  Location: [facility] ORS;  Service: Orthopedics;  Laterality: Left;   INSERTION, GASTROSTOMY TUBE, ROBOT-ASSISTED N/A 09/30/2023   Procedure: INSERTION, GASTROSTOMY TUBE, ROBOT-ASSISTED;  Surgeon: Jordis Laneta FALCON, MD;  Location: [facility] ORS;  Service: General;  Laterality: N/A;   IR IMAGING GUIDED PORT INSERTION  06/30/2023   IR REMOVAL TUN ACCESS W/ PORT W/O FL MOD SED  12/29/2023   KNEE ARTHROSCOPY Right    KNEE ARTHROSCOPY Left    MANDIBLE FRACTURE SURGERY     as teenager   Patient Active Problem List   Diagnosis Date Noted   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) 06/17/2023   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   Alcohol use disorder, moderate, in sustained remission (HCC) 05/18/2023   Somnolence  01/08/2023   Dyslipidemia 02/02/2022   B12 deficiency 11/14/2021   Overweight (BMI 25.0-29.9) 08/27/2021   Major depression 08/26/2021   Hyponatremia    Parkinson's disease (HCC)    Essential hypertension    Type 2 diabetes mellitus with hyperlipidemia (HCC)    Carpal tunnel syndrome, left 11/08/2020   Chronic bilateral thoracic back pain 10/15/2020   Lumbar spine pain 10/15/2020   Thoracic radiculitis 10/15/2020   Chronic pain syndrome 10/15/2020   Bilateral hand numbness 10/09/2020   Moderate episode of recurrent major depressive disorder (HCC) 03/06/2020   Spinal stenosis of thoracic region 01/18/2020   Neck pain 12/14/2019   Radiculitis of right cervical region 12/14/2019   Erectile dysfunction due to diseases classified elsewhere 05/31/2019   Hx of chronic eczema 05/31/2019   Hyperlipidemia associated with type 2 diabetes mellitus (HCC) 05/31/2019   Carpal tunnel syndrome, right 03/15/2019   Eczema 06/27/2018   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 04/23/2017   Anxiety 10/26/2012   Seborrhea 10/26/2012   Social phobia 10/26/2012   Bell's palsy 11/19/2003   PCP: Fernande Ophelia PARAS DOUGLAS, MD  REFERRING PROVIDER: Fernande Ophelia PARAS DOUGLAS, MD  REFERRING DIAG: G20.C (ICD-10-CM) - Parkinsonism, unspecified Parkinsonism type (HCC)   THERAPY DIAG:  Muscle weakness (generalized)  Balance problem  Other abnormalities of  gait and mobility  Parkinsonism, unspecified Parkinsonism type (HCC)  Rationale for Evaluation and Treatment: Rehabilitation  ONSET DATE: Parkinson's Dx: ~ 4 years ago  SUBJECTIVE:   SUBJECTIVE STATEMENT: Pt presents to physical therapy with referral for Parkinsonism and concerns of general muscle wasting/weakening which has lead to difficulty with prolonged standing, community ambulation, and negotiating stairs at times. Pt was diagnosed with Parkinson's disease roughly 4 years ago. Patient also received treatment for throat cancer  which he partially attributes to feeling weaker, losing weight, and having falls around the house roughly 4 months ago. Patient ended up in the hospital in June 2025 after frequent falls and visual hallucinations where the medications he was taking where adjusted and he has not had any falls since this time. Pt reports feeling unsteady when ambulating or standing for a prolonged period of time along with UE and LE muscles feeling weak.   PERTINENT HISTORY: Patient with PMH including major depression, dysphagia, peripheral neuropathy in feet, and visual impairment (Left eye worse than Right) PAIN:  Are you having pain? No Mild right ankle pain when he wakes up in the morning which improves with movement  PRECAUTIONS: None  RED FLAGS: None   WEIGHT BEARING RESTRICTIONS: No  FALLS:  Has patient fallen in last 6 months? Yes. Number of falls Pt had frequent falls roughly 5 months ago along with hallucinations/altered mental status which resulted in hospitalization in June 2025. Pt has not had any falls since this time period but reports feeling weak.   LIVING ENVIRONMENT: Lives with: lives with their spouse Lives in: House/apartment Stairs: Yes: External: 4-5 steps; can reach both Has following equipment at home: None  OCCUPATION: Mechanic  PLOF: Independent  PATIENT GOALS: To improve independence around household and with community activity and to decrease fall risk.  NEXT MD VISIT: Neurology appointment on 03/28/2024  OBJECTIVE:  Note: Objective measures were completed at Evaluation unless otherwise noted.  PATIENT SURVEYS:  ABC Scale: 56.88%  COGNITION: Overall cognitive status: Within functional limits for tasks assessed     SENSATION: Light touch: WFL  Coordination: Rapid alternating movements of UEs: negative Heel to shin coordination test: negative  POSTURE: rounded shoulders and forward head resting tremor of bilateral UEs noted  LOWER EXTREMITY ROM:  Active ROM  Right eval Left eval  Hip flexion [facility]  Hip extension    Hip abduction    Hip adduction    Hip internal rotation    Hip external rotation    Knee flexion [facility] WFL  Knee extension [facility]  Ankle dorsiflexion [facility] WFL  Ankle plantarflexion    Ankle inversion    Ankle eversion     (Blank rows = not tested)  LOWER EXTREMITY MMT:  MMT Right eval Left eval  Hip flexion 4- 4-  Hip extension    Hip abduction 4 4  Hip adduction    Hip internal rotation 3+ 3+  Hip external rotation 3+ 3+  Knee flexion 5 5  Knee extension 4+ 4+  Ankle dorsiflexion 5 5  Ankle plantarflexion    Ankle inversion    Ankle eversion     (Blank rows = not tested)  FUNCTIONAL TESTS:  5xSTS: 13.09 seconds FGA: 19/30 (biggest challenges with narrow BOS, eyes closed, change in gait speed, and backwards ambulation) mCTSB: moderate sway noted under conditions with EC   GAIT: Distance walked: 30 ft Assistive device utilized: None Level of assistance: Complete Independence Comments: Pt ambulates with reciprocal gait pattern, narrow BOS, decreased  cadence  STAIRS: Patient ascends and descends stairs with step to gait pattern, use of bilateral HRs, and requires increased time to descend safely (decreased eccentric control).                                                                                                                              TREATMENT DATE: 03/27/2024    See evaluation  PATIENT EDUCATION:  Education details: Pt and spouse of pt educated on PT prognosis and POC Person educated: Patient and Spouse Education method: Explanation and Verbal cues Education comprehension: verbalized understanding  HOME EXERCISE PROGRAM: Deferred until next visit  ASSESSMENT:  CLINICAL IMPRESSION: Patient is a 68 y.o. male who was seen today for physical therapy evaluation and treatment with referral for Parkinsonism which affects his ability to safely ambulate household and community distances  without risk of falling. Pt has deficits related to abnormal gait, decreased static and dynamic balance, and decreased gross LE strength. These deficits lead to limitations with completing functional transfers, standing for prolonged periods of time, or ambulating household and community distances without significant difficulty or feelings of imbalance. Pt will benefit from skilled physical therapy intervention 3x a week for 6 weeks to address limitations listed above and maximize functional independence.    OBJECTIVE IMPAIRMENTS: Abnormal gait, decreased balance, decreased endurance, decreased knowledge of condition, decreased safety awareness, and postural dysfunction.   ACTIVITY LIMITATIONS: carrying, lifting, standing, squatting, sleeping, stairs, transfers, and locomotion level  PARTICIPATION LIMITATIONS: community activity and occupation  PERSONAL FACTORS: Fitness and 3+ comorbidities: anxiety/depression, Parkinson's disease, HTN, Diabetes, hx of CA, and peripheral neuropathy are also affecting patient's functional outcome.   REHAB POTENTIAL: Good  CLINICAL DECISION MAKING: Evolving/moderate complexity  EVALUATION COMPLEXITY: Moderate   GOALS: Goals reviewed with patient? Yes  SHORT TERM GOALS: Target date: 04/17/2024 Pt to increase 5xSTS score to at least 11 seconds without the use of bilateral upper extremity support a to improve independence with functional transfers and decrease fall risk.   Baseline: 13.09 seconds  Goal status: INITIAL  2.  Pt will increase BERG score by at least 4 points to demonstrate improvement in static/dynamic balance so that patient will experience an improvement in daily household tasks and transfers with greater ease.   Baseline: will assess during next session Goal status: INITIAL   LONG TERM GOALS: Target date: 05/08/2024  Pt will increase ABC scale score to at least a 68% so that he experiences an improvement in self-confidence with balance  related activities.  Baseline: 56.88% Goal status: INITIAL  2.  Pt will improve FGA score to at least a 23/30 so that patient will experience an improvement in dynamic balance and functional tasks with greater ease.   Baseline: 19/30  Goal status: INITIAL  3.  Pt will improve distance by at least 70 ft without the use of an assistive device to improve ability to ambulate community distances safely.   Baseline: will assess during next  session Goal status: INITIAL  PLAN:  PT FREQUENCY: 2x/week  PT DURATION: 6 weeks  PLANNED INTERVENTIONS: 97164- PT Re-evaluation, 97110-Therapeutic exercises, 97530- Therapeutic activity, 97112- Neuromuscular re-education, 97535- Self Care, [id number]- Manual therapy, Patient/Family education, Balance training, Stair training, Cryotherapy, and Moist heat  PLAN FOR NEXT SESSION: Measure , BERG, issue an HEP, discuss neurology appointment (11/16), continue any further testing that may be appropriate  Ozell JAYSON Sero, PT, DPT # [phone] Curtistine Bracket, Student-PT 03/27/2024, 11:35 AM

## 2024-03-29 ENCOUNTER — Encounter: Payer: Self-pay | Admitting: Physical Therapy

## 2024-03-29 ENCOUNTER — Ambulatory Visit: Admitting: Physical Therapy

## 2024-03-29 DIAGNOSIS — M6281 Muscle weakness (generalized): Secondary | ICD-10-CM | POA: Diagnosis not present

## 2024-03-29 DIAGNOSIS — G20C Parkinsonism, unspecified: Secondary | ICD-10-CM

## 2024-03-29 DIAGNOSIS — R2689 Other abnormalities of gait and mobility: Secondary | ICD-10-CM

## 2024-03-29 NOTE — Progress Notes (Signed)
 [facility] MD/PA/NP OP Progress Note  04/03/2024 12:51 PM Lynwood Manus Julieann Mickey.  MRN:  [id number]  Chief Complaint:  Chief Complaint  Patient presents with   Follow-up   HPI:  According to the chart review, he was seen by DR. Potter. 03/2024 - - Start Depakote 125 mg twice a day for 2 weeks, then increase to 250 mg twice a day.  He was seen by Dr. Melanee. He was referred to GI for dysphagia.   This is a follow-up appointment for depression, anxiety and insomnia.  He states that he was very emotional and had a rash when he was on Viibryd   It has been better since he is back on sertraline .  He struggles with insomnia.  Although he may sleep after 8 hours, he tends to struggle more with initial insomnia even with higher dose of trazodone .  He denies any fall or dizziness.  He feels down at times and has occasional anxiety.  He denies SI, hallucinations.   [name] presents to the visit with the patient consent.  She is thinks he has been upset due to physical issues.  He has seen physical therapy twice a week.  He and his son talked about TMS but his son is concerned about dosing.  They were more inclined to consider esketamine treatment after he is enrolled in the new insurance.  Psychoeducation is provided regarding the both benefit and risk of TMS and esketamine.  He has been started on Depakote by Dr. Lane.  She thinks it was due to him mentioning about being emotional.  They agree with the plans as outlined below.    Wt Readings from Last 3 Encounters:  04/03/24 169 lb 12.8 oz (77 kg)  03/22/24 167 lb (75.8 kg)  03/15/24 168 lb 12.8 oz (76.6 kg)    02/07/24 168 lb 3.2 oz (76.3 kg)  01/26/24 164 lb 9.6 oz (74.7 kg)  01/03/24 162 lb 3.2 oz (73.6 kg)     Visit Diagnosis:    ICD-10-CM   1. MDD (major depressive disorder), recurrent episode, mild  F33.0     2. GAD (generalized anxiety disorder)  F41.1     3. Insomnia, unspecified type  G47.00     4. Obstructive sleep apnea  G47.33      5. Restless leg syndrome  G25.81       Past Psychiatric History: Please see initial evaluation for full details. I have reviewed the history. No updates at this time.     Past Medical History:  Past Medical History:  Diagnosis Date   Anemia    Anxiety    Arthritis    Carpal tunnel syndrome    Depression    Diabetes (HCC)    Hyperlipidemia    Hypertension    Loss of teeth due to extraction    Neoplasm related pain    Parkinson's disease [facility])     Past Surgical History:  Procedure Laterality Date   CARPAL TUNNEL RELEASE Right 11/28/2020   Procedure: CARPAL TUNNEL RELEASE ENDOSCOPIC;  Surgeon: Edie Norleen PARAS, MD;  Location: [facility] ORS;  Service: Orthopedics;  Laterality: Right;   CARPAL TUNNEL RELEASE Left 01/22/2021   Procedure: CARPAL TUNNEL RELEASE ENDOSCOPIC;  Surgeon: Edie Norleen PARAS, MD;  Location: [facility] ORS;  Service: Orthopedics;  Laterality: Left;   INSERTION, GASTROSTOMY TUBE, ROBOT-ASSISTED N/A 09/30/2023   Procedure: INSERTION, GASTROSTOMY TUBE, ROBOT-ASSISTED;  Surgeon: Jordis Laneta FALCON, MD;  Location: [facility] ORS;  Service: General;  Laterality: N/A;   IR IMAGING  GUIDED PORT INSERTION  06/30/2023   IR REMOVAL TUN ACCESS W/ PORT W/O FL MOD SED  12/29/2023   KNEE ARTHROSCOPY Right    KNEE ARTHROSCOPY Left    MANDIBLE FRACTURE SURGERY     as teenager    Family Psychiatric History: Please see initial evaluation for full details. I have reviewed the history. No updates at this time.     Family History:  Family History  Problem Relation Age of Onset   Diabetes Mother    Hypertension Mother    CVA Father    Heart attack Father    Depression Sister    Depression Brother     Social History:  Social History   Socioeconomic History   Marital status: Significant Other    Spouse name: [name]   Number of children: Not on file   Years of education: Not on file   Highest education level: Not on file  Occupational History   Not on file  Tobacco Use   Smoking status:  Never    Passive exposure: Never   Smokeless tobacco: Never  Vaping Use   Vaping status: Never Used  Substance and Sexual Activity   Alcohol use: Not Currently   Drug use: Never   Sexual activity: Not Currently    Birth control/protection: None  Other Topics Concern   Not on file  Social History Narrative   Lives with friend   Social Drivers of Health   Financial Resource Strain: High Risk (02/26/2024)   [facility]   Overall Financial Resource Strain (CARDIA)    Difficulty of Paying Living Expenses: Hard  Food Insecurity: Patient Declined (02/26/2024)   Received from [facility]   Hunger Vital Sign    Within the past 12 months, you worried that your food would run out before you got the money to buy more.: Patient declined    Within the past 12 months, the food you bought just didn't last and you didn't have money to get more.: Patient declined  Transportation Needs: No Transportation Needs (02/26/2024)   Received from [facility] - Transportation    In the past 12 months, has lack of transportation kept you from medical appointments or from getting medications?: No    Lack of Transportation (Non-Medical): No  Physical Activity: Inactive (09/29/2023)   Exercise Vital Sign    Days of Exercise per Week: 0 days    Minutes of Exercise per Session: 0 min  Stress: No Stress Concern Present (09/29/2023)   [employer] of [employer]ational [employer] - Occupational Stress Questionnaire    Feeling of Stress : Not at all  Social Connections: Moderately Isolated (09/29/2023)   Social Connection and Isolation Panel    Frequency of Communication with Friends and Family: Three times a week    Frequency of Social Gatherings with Friends and Family: Twice a week    Attends Religious Services: Never    Database Administrator or Organizations: No    Attends Banker Meetings: Never    Marital Status:  Married    Allergies:  Allergies  Allergen Reactions   Mirtazapine Rash   Other Itching and Rash    ETCO2 tubing. Medtronic Microstream advance    Metabolic Disorder Labs: Lab Results  Component Value Date   HGBA1C 5.2 08/25/2021   MPG 102.54 08/25/2021   No results found for: PROLACTIN No results found for: CHOL, TRIG, HDL, CHOLHDL, VLDL, LDLCALC Lab Results  Component Value Date   TSH 2.128 12/22/2023   TSH 2.016 09/14/2023    Therapeutic Level Labs: No results found for: LITHIUM No results found for: VALPROATE No results found for: CBMZ  Current Medications: Current Outpatient Medications  Medication Sig Dispense Refill   divalproex (DEPAKOTE) 250 MG DR tablet Take 250 mg by mouth daily.     ramelteon  (ROZEREM ) 8 MG tablet Take 1 tablet (8 mg total) by mouth at bedtime. 30 tablet 1   ACCU-CHEK GUIDE TEST test strip TEST 2X DAILY     Accu-Chek Softclix Lancets lancets 2 (two) times daily.     benzonatate  (TESSALON  PERLES) 100 MG capsule Take 2 capsules (200 mg total) by mouth 3 (three) times daily as needed for cough. 30 capsule 0   Blood Glucose Monitoring Suppl (ACCU-CHEK GUIDE ME) w/Device KIT See admin instructions.     buPROPion  (WELLBUTRIN  XL) 300 MG 24 hr tablet Take 1 tablet (300 mg total) by mouth daily. 30 tablet 5   busPIRone  (BUSPAR ) 7.5 MG tablet Take 7.5 mg by mouth 2 (two) times daily.     calcium  carbonate (CALCIUM  600) 600 MG TABS tablet Take 1 tablet (600 mg total) by mouth daily. For low calcium  60 tablet 0   calcium  carbonate (TUMS) 500 MG chewable tablet Chew 500 mg by mouth daily.     carbidopa -levodopa  (SINEMET  IR) 25-100 MG tablet Take 2-3 tablets by mouth See admin instructions. Take 3 tablets in morning 2 tablet at bedtime (Patient taking differently: Take 1 tablet by mouth in the morning and at bedtime.)     [START ON 04/07/2024] clonazePAM  (KLONOPIN ) 1 MG tablet Take 1 tablet (1 mg total) by mouth 3 (three) times daily as  needed for anxiety. 90 tablet 1   cyanocobalamin  (VITAMIN B12) 1000 MCG tablet Take 1,000 mcg by mouth daily.     fentaNYL  (DURAGESIC ) 12 MCG/HR Place 1 patch onto the skin every 3 (three) days. 10 patch 0   folic acid  (FOLVITE ) 1 MG tablet TAKE 1 TABLET(1 MG) BY MOUTH DAILY 30 tablet 3   gabapentin  (NEURONTIN ) 300 MG capsule Take 300 mg by mouth daily. (Patient not taking: Reported on 03/22/2024)     ketoconazole (NIZORAL) 2 % shampoo Apply 1 application  topically every other day.     levothyroxine (SYNTHROID) 50 MCG tablet Take 50 mcg by mouth.     lidocaine -prilocaine  (EMLA ) cream Apply to affected area once 30 g 3   magnesium  chloride (SLOW-MAG) 64 MG TBEC SR tablet Take 2 tablets (128 mg total) by mouth 2 (two) times daily. For low magnesium  (Patient not taking: Reported on 03/22/2024) 120 tablet 00   metFORMIN  (GLUCOPHAGE ) 500 MG tablet Take 500 mg by mouth daily with breakfast.     Multiple Vitamin (MULTI-VITAMIN) tablet Take 1 tablet by mouth daily.     Nutritional Supplements (NUTREN 1.5) LIQD Give 1 carton in feeding tube via bolus syringe 5 times a day.  Flush with 60cc of water  before and 120cc of water  after.     ondansetron  (ZOFRAN ) 8 MG tablet Take 1 tablet (8 mg total) by mouth every 8 (eight) hours as needed for nausea or vomiting. Start on the third day after cisplatin . 30 tablet 1   Oxycodone  HCl 10 MG TABS Take 1 tablet (10 mg total) by mouth every 4 (four) hours as needed. 60 tablet 0   pantoprazole  (PROTONIX ) 40 MG tablet TAKE 1 TABLET(40 MG) BY MOUTH DAILY 90 tablet 2   polyethylene glycol (MIRALAX /  GLYCOLAX) 17 g packet Take 17 g by mouth daily.     prochlorperazine  (COMPAZINE ) 10 MG tablet Take 1 tablet (10 mg total) by mouth every 6 (six) hours as needed for nausea or vomiting. 30 tablet 1   rosuvastatin  (CRESTOR ) 20 MG tablet Take 20 mg by mouth daily.     senna (SENOKOT) 8.6 MG tablet Take 1 tablet by mouth 2 (two) times daily.     sertraline  (ZOLOFT ) 100 MG tablet  Take 1.5 tablets (150 mg total) by mouth at bedtime. 45 tablet 3   sucralfate  (CARAFATE ) 1 g tablet Take 1 tablet (1 g total) by mouth 3 (three) times daily before meals. Dissolve in 4 tbs of warm water , swish and swallow 90 tablet 1   tadalafil (CIALIS) 5 MG tablet Take 5 mg by mouth daily.     tamsulosin (FLOMAX) 0.4 MG CAPS capsule Take 0.4 mg by mouth daily.     traZODone  (DESYREL ) 50 MG tablet Take 0.5-2 tablets (25-100 mg total) by mouth at bedtime as needed for sleep. (Patient not taking: Reported on 04/03/2024) 60 tablet 3   No current facility-administered medications for this visit.     Musculoskeletal: Strength & Muscle Tone: within normal limits Gait & Station: normal Patient leans: N/A  Psychiatric Specialty Exam: Review of Systems  Psychiatric/Behavioral:  Positive for dysphoric mood and sleep disturbance. Negative for agitation, behavioral problems, confusion, decreased concentration, hallucinations, self-injury and suicidal ideas. The patient is nervous/anxious. The patient is not hyperactive.   All other systems reviewed and are negative.   Blood pressure 132/80, pulse 96, temperature (!) 97.5 F (36.4 C), temperature source Temporal, height 5' 9 (1.753 m), weight 169 lb 12.8 oz (77 kg).Body mass index is 25.08 kg/m.  General Appearance: Well Groomed  Eye Contact:  Good  Speech:  Clear and Coherent  Volume:  Normal  Mood:  Depressed  Affect:  Appropriate, Congruent, and Restricted  Thought Process:  Coherent  Orientation:  Full (Time, Place, and Person)  Thought Content: Logical   Suicidal Thoughts:  No  Homicidal Thoughts:  No  Memory:  Immediate;   Good  Judgement:  Good  Insight:  Good  Psychomotor Activity:  Normal  Concentration:  Concentration: Good and Attention Span: Good  Recall:  Good  Fund of Knowledge: Good  Language: Good  Akathisia:  No  Handed:  Right  AIMS (if indicated): not done  Assets:  Communication Skills Desire for Improvement   ADL's:  Intact  Cognition: WNL  Sleep:  Poor   Screenings: ECT-MADRS    Flowsheet Row ECT Treatment from 07/28/2021 in [facility]  MADRS Total Score 41   GAD-7    Flowsheet Row Counselor from 09/08/2023 in [facility] Office Visit from 02/25/2023 in [facility] Office Visit from 01/07/2023 in [facility] Office Visit from 10/12/2022 in [facility] Office Visit from 06/16/2022 in [facility]  Total GAD-7 Score 20 18 18 12 20    Mini-Mental    Flowsheet Row ECT Treatment from 07/28/2021 in [facility]  Total Score (max 30 points ) 30   PHQ2-9    Flowsheet Row Office Visit from 03/22/2024 in [facility] [place]. [facility] Office Visit from 12/22/2023 in [facility] Of [place].  [facility] Office Visit from 11/26/2023 in [facility] Of [place]. [facility] Counselor from 09/08/2023 in [facility] Office Visit from 06/22/2023 in [facility] Of [place]. [facility]  PHQ-2 Total Score 0 0 0 3 6  PHQ-9 Total Score -- -- -- 18 --   Flowsheet Row Admission (Discharged) from 09/30/2023 in [facility] Counselor from 09/08/2023 in [facility] Office Visit from 03/31/2022 in [facility]  C-SSRS RISK CATEGORY No Risk No Risk No Risk     Assessment and Plan:  Aamir Mclinden is a 68 y.o. 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 diabetes,  hypertension, hyperlipidemia, carpal tunnel syndrome, s/p knee arthroscopy , who presents for follow up appointment for below.    1. MDD (major depressive disorder), recurrent episode, mild 2. GAD (generalized anxiety disorder) He is unemployed due to pain. 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 He lost his father with liver cancer Feb 2024,  History: anxiety since age 54 . depression since 1979, ECT in 2023 with limited benefit, originally on duloxetine  60 mg daily, bupropion  150 mg daily Abilify  30 mg daily        Was on Buspar  7.5 mg BID till 10/2023- d/c to avoid polypharmacy. Stimulant caused restlessness. Previously referred to TMS Ritalin  5 mg did not make any difference, but cause adverse reaction of restlessness.   He had adverse reaction both mood lability, rash when he tried Viibryd , and has been back on the sertraline  since then.  It has been discussed at length regarding the reconsideration of TMS.  They are interested in esketamine treatment, and is considering this option after they find out about the new insurance.  Will make intervention for sleep at this time while maintaining on the current medication regimen.  Will continue sertraline  to target depression and anxiety, along with the bupropion  as adjunctive treatment for depression.   Noted that he was seen by Dr. Lane, who prescribed Depakote.  It is discussed with him that he does not have any signs of bipolar disorder, and thus this medication is not necessary recommended from mood standpoint.  They expressed understanding of this.   3. Insomnia, unspecified type 4. Obstructive sleep apnea # vitamin D  deficiency - he has severe obstructive sleep apnea, AHI of 31. Not interested in CPAP machine He continues to experience initial insomnia with limited benefit from recent uptitration of trazodone .  He tried Ambien, Lunesta in the  past with limited benefit/some adverse reaction.  He tried ramelteon  as needed for insomnia.   5. Restless leg syndrome Overall improvement, coincided with discontinuation of quetiapine .  Will continue to assess and intervene as needed.    # benzodiazepine dependence (prescribed) - tapered down from 1 mg TID since April 2023, which was uptitrated back Feb 2025 No change. Although he was adjusting well to tapering down medication 2 years ago, his anxiety has worsened in the setting of cancer diagnosis.  He reports good benefit since uptitration without any side effects of drowsiness, oversedation or fall.  Both the patient and his wife is aware of potential risk of respiratory suppression with concomitant use of opioid.    5. Cognitive deficits Functional  Status   IADL: Independent in the following:driving (advised to refrain from driving [id number])           Requires assistance with the following:  managing finances, medications (using pill box, his wife checks) ADL  Independent in the following: bathing and hygiene, feeding, continence, grooming and toileting, walking          Requires assistance with the following: Folate, Vitamin B12, TSH- low folic acid  08/2022. Otherwise wnl Images Brain MRI 08/2021: There is no evidence of an acute infarct, intracranial hemorrhage, mass, midline shift, or extra-axial fluid collection.Small T2 hyperintensities in the cerebral white matter bilaterally are nonspecific but compatible with mild chronic small vessel ischemic disease. Dilated perivascular spaces are noted in the basal ganglia bilaterally. There is mild generalized cerebral atrophy. Neuropsych assessment:  Etiology: parkinson, r/o VaD   He denies any subjective symptoms of memory loss.  However, his IADL is somewhat limited. Will continue to assess this.   Plan  Continue sertraline  150 mg at night  Hold Viibryd  Continue bupropion  300 mg daily - reduced 10/2023  Start ramelteon  8 mg at night as  needed for insomnia Hold trazodone  (was on 150-200 mg) Continue clonazepam  1 mg three times a day Next appointment-  1/20 at 10:30, IP They are advised to contact the sleep clinic regarding the result - TSH- 4.238 10/2022 -  He agrees that this clinical research associate communicates with his provider, [employer], Fonda SAUNDERS, NP  - he discontinued quetiapine  a few weeks ago   Past trials of medication (the following medication caused either side effect, or limited benefit): lexapro, duloxetine , venlafaxine, viibryd  (rash), mirtazapine (rash), Abilify , quetiapine  (insomnia, RLS). Amitriptyline (resetless leg), Ambien, Lunesta (caused some side effect)   The patient demonstrates the following risk factors for suicide: Chronic risk factors for suicide include: psychiatric disorder of depression and chronic pain. Acute risk factors for suicide include: unemployment. Protective factors for this patient include: positive social support, coping skills and hope for the future. Considering these factors, the overall suicide risk at this point appears to be low. Patient is appropriate for outpatient follow up.    Collaboration of Care: Collaboration of Care: Other reviewed notes in Epic  Patient/Guardian was advised Release of Information must be obtained prior to any record release in order to collaborate their care with an outside provider. Patient/Guardian was advised if they have not already done so to contact the registration department to sign all necessary forms in order for us  to release information regarding their care.   Consent: Patient/Guardian gives verbal consent for treatment and assignment of benefits for services provided during this visit. Patient/Guardian expressed understanding and agreed to proceed.    Katheren Sleet, MD 04/03/2024, 12:51 PM

## 2024-03-29 NOTE — Therapy (Unsigned)
 OUTPATIENT PHYSICAL THERAPY LOWER EXTREMITY TREATMENT  Patient Name: Ricky [name]. MRN: [id number] DOB:October 29, 1955, 68 y.o., male Today's Date: 03/29/2024  END OF SESSION:  PT End of Session - 03/29/24 1529     Visit Number 2    Number of Visits 12    Date for Recertification  05/08/24    PT Start Time 1430    PT Stop Time 1515    PT Time Calculation (min) 45 min    Activity Tolerance Patient tolerated treatment well;No increased pain    Behavior During Therapy WFL for tasks assessed/performed         Past Medical History:  Diagnosis Date   Anemia    Anxiety    Arthritis    Carpal tunnel syndrome    Depression    Diabetes (HCC)    Hyperlipidemia    Hypertension    Loss of teeth due to extraction    Neoplasm related pain    Parkinson's disease [facility])    Past Surgical History:  Procedure Laterality Date   CARPAL TUNNEL RELEASE Right 11/28/2020   Procedure: CARPAL TUNNEL RELEASE ENDOSCOPIC;  Surgeon: Edie Norleen PARAS, MD;  Location: [facility] ORS;  Service: Orthopedics;  Laterality: Right;   CARPAL TUNNEL RELEASE Left 01/22/2021   Procedure: CARPAL TUNNEL RELEASE ENDOSCOPIC;  Surgeon: Edie Norleen PARAS, MD;  Location: [facility] ORS;  Service: Orthopedics;  Laterality: Left;   INSERTION, GASTROSTOMY TUBE, ROBOT-ASSISTED N/A 09/30/2023   Procedure: INSERTION, GASTROSTOMY TUBE, ROBOT-ASSISTED;  Surgeon: Jordis Laneta FALCON, MD;  Location: [facility] ORS;  Service: General;  Laterality: N/A;   IR IMAGING GUIDED PORT INSERTION  06/30/2023   IR REMOVAL TUN ACCESS W/ PORT W/O FL MOD SED  12/29/2023   KNEE ARTHROSCOPY Right    KNEE ARTHROSCOPY Left    MANDIBLE FRACTURE SURGERY     as teenager   Patient Active Problem List   Diagnosis Date Noted   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) 06/17/2023   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   Alcohol use disorder, moderate, in sustained remission (HCC) 05/18/2023   Somnolence  01/08/2023   Dyslipidemia 02/02/2022   B12 deficiency 11/14/2021   Overweight (BMI 25.0-29.9) 08/27/2021   Major depression 08/26/2021   Hyponatremia    Parkinson's disease (HCC)    Essential hypertension    Type 2 diabetes mellitus with hyperlipidemia (HCC)    Carpal tunnel syndrome, left 11/08/2020   Chronic bilateral thoracic back pain 10/15/2020   Lumbar spine pain 10/15/2020   Thoracic radiculitis 10/15/2020   Chronic pain syndrome 10/15/2020   Bilateral hand numbness 10/09/2020   Moderate episode of recurrent major depressive disorder (HCC) 03/06/2020   Spinal stenosis of thoracic region 01/18/2020   Neck pain 12/14/2019   Radiculitis of right cervical region 12/14/2019   Erectile dysfunction due to diseases classified elsewhere 05/31/2019   Hx of chronic eczema 05/31/2019   Hyperlipidemia associated with type 2 diabetes mellitus (HCC) 05/31/2019   Carpal tunnel syndrome, right 03/15/2019   Eczema 06/27/2018   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 04/23/2017   Anxiety 10/26/2012   Seborrhea 10/26/2012   Social phobia 10/26/2012   Bell's palsy 11/19/2003    PCP: Fernande Ophelia PARAS DOUGLAS, MD  REFERRING PROVIDER: Fernande Ophelia PARAS DOUGLAS, MD  REFERRING DIAG: G20.C (ICD-10-CM) - Parkinsonism, unspecified Parkinsonism type (HCC)   THERAPY DIAG:  Muscle weakness (generalized)  Balance problem  Other abnormalities of  gait and mobility  Parkinsonism, unspecified Parkinsonism type (HCC)  Rationale for Evaluation and Treatment: Rehabilitation  ONSET DATE: Parkinson's Dx: ~ 4 years ago  SUBJECTIVE:   SUBJECTIVE STATEMENT: Pt presents to physical therapy with referral for Parkinsonism and concerns of general muscle wasting/weakening which has lead to difficulty with prolonged standing, community ambulation, and negotiating stairs at times. Pt was diagnosed with Parkinson's disease roughly 4 years ago. Patient also received treatment for throat  cancer which he partially attributes to feeling weaker, losing weight, and having falls around the house roughly 4 months ago. Patient ended up in the hospital in June 2025 after frequent falls and visual hallucinations where the medications he was taking where adjusted and he has not had any falls since this time. Pt reports feeling unsteady when ambulating or standing for a prolonged period of time along with UE and LE muscles feeling weak.   PERTINENT HISTORY: Patient with PMH including major depression, dysphagia, peripheral neuropathy in feet, and visual impairment (Left eye worse than Right) PAIN:  Are you having pain? No Mild right ankle pain when he wakes up in the morning which improves with movement  PRECAUTIONS: None  RED FLAGS: None   WEIGHT BEARING RESTRICTIONS: No  FALLS:  Has patient fallen in last 6 months? Yes. Number of falls Pt had frequent falls roughly 5 months ago along with hallucinations/altered mental status which resulted in hospitalization in June 2025. Pt has not had any falls since this time period but reports feeling weak.   LIVING ENVIRONMENT: Lives with: lives with their spouse Lives in: House/apartment Stairs: Yes: External: 4-5 steps; can reach both Has following equipment at home: None  OCCUPATION: Mechanic  PLOF: Independent  PATIENT GOALS: To improve independence around household and with community activity and to decrease fall risk.  NEXT MD VISIT: Neurology appointment on 03/28/2024  OBJECTIVE:  Note: Objective measures were completed at Evaluation unless otherwise noted.  PATIENT SURVEYS:  ABC Scale: 56.88%  COGNITION: Overall cognitive status: Within functional limits for tasks assessed     SENSATION: Light touch: WFL  Coordination: Rapid alternating movements of UEs: negative Heel to shin coordination test: negative  POSTURE: rounded shoulders and forward head resting tremor of bilateral UEs noted  LOWER EXTREMITY  ROM:  Active ROM Right eval Left eval  Hip flexion [facility]  Hip extension    Hip abduction    Hip adduction    Hip internal rotation    Hip external rotation    Knee flexion [facility] WFL  Knee extension [facility]  Ankle dorsiflexion [facility] WFL  Ankle plantarflexion    Ankle inversion    Ankle eversion     (Blank rows = not tested)  LOWER EXTREMITY MMT:  MMT Right eval Left eval  Hip flexion 4- 4-  Hip extension    Hip abduction 4 4  Hip adduction    Hip internal rotation 3+ 3+  Hip external rotation 3+ 3+  Knee flexion 5 5  Knee extension 4+ 4+  Ankle dorsiflexion 5 5  Ankle plantarflexion    Ankle inversion    Ankle eversion     (Blank rows = not tested)  FUNCTIONAL TESTS:  5xSTS: 13.09 seconds FGA: 19/30 (biggest challenges with narrow BOS, eyes closed, change in gait speed, and backwards ambulation) mCTSB: moderate sway noted under conditions with EC   GAIT: Distance walked: 30 ft Assistive device utilized: None Level of assistance: Complete Independence Comments: Pt ambulates with reciprocal gait pattern, narrow BOS, decreased  cadence  STAIRS: Patient ascends and descends stairs with step to gait pattern, use of bilateral HRs, and requires increased time to descend safely (decreased eccentric control).                                                                                                                              TREATMENT DATE: 03/29/2024   Subjective: Pt had neurology appointment yesterday on (11/16) and patient reports that his MD does not believe he has Parkinson's Disease. Pt denies resting pain or any new falls since last tx session.   Therapeutic Exercise (3 AWs) Seated LAQs, 2 x 10   Standing hip abduction, 2 x 12 Standing hip extension, 2 x 12 Standing Heel raises, 2 x 15  Therapeutic Activity Tandem stance/ gait in //-bars with light to no UE assist   STS from gray chair with no UE assist. SBA for safety/cuing.    Lateral resisted  walking in // bars, 2 black Tbs, 5 x bilaterally   Forward marching with alternating hand to knee taps, 4 x down and back  : 1050 ft (fatigue noted upon completion)  BERG: 53/56  Issued HEP (see below)  PATIENT EDUCATION:  Education details: Pt and spouse of pt educated on PT prognosis and POC Person educated: Patient and Spouse Education method: Explanation and Verbal cues Education comprehension: verbalized understanding  HOME EXERCISE PROGRAM: Access Code: [email or web address] URL: [email or web address] Date: 03/29/2024 Prepared by: Ozell Sero Exercises - Standing Hip Extension with Ankle Weight - 1 x daily - 3-5 x weekly - 2 sets - 15 reps - Standing Hip Abduction AROM - 1 x daily - 3-5 x weekly - 2 sets - 15 reps - Sit to Stand Without Arm Support - 1 x daily - 3-5 x weekly - 2 sets - 10 reps - Seated Long Arc Quad - 1 x daily - 3-5 x weekly - 2 sets - 10 reps - Heel Raises with Counter Support - 1 x daily - 3-5 x weekly - 2 sets - 15 reps    ASSESSMENT:  CLINICAL IMPRESSION: Focus of today's treatment session was to obtain remaining desired objective outcome measures and to issue an HEP for patient to begin working on at home. Assessed (1,050 ft) and BERG (53/56) on this day. Pt introduced to therapeutic exercises (see above) which he was able to complete without significant difficulty that were added to his HEP. Pt also introduced to dynamic balance tasks in // bars such as tandem walking and forward marching which he was able to complete without any LOB. Pt reported bilateral LE fatigue at end of tx session, particularly in lateral hip regions. Will continue to monitor and progress to tolerance of patient.   OBJECTIVE IMPAIRMENTS: Abnormal gait, decreased balance, decreased endurance, decreased knowledge of condition, decreased safety awareness, and postural dysfunction.   ACTIVITY LIMITATIONS: carrying, lifting, standing, squatting, sleeping, stairs, transfers, and  locomotion level  PARTICIPATION LIMITATIONS: community activity and  occupation  PERSONAL FACTORS: Fitness and 3+ comorbidities: anxiety/depression, Parkinson's disease, HTN, Diabetes, hx of CA, and peripheral neuropathy are also affecting patient's functional outcome.   REHAB POTENTIAL: Good  CLINICAL DECISION MAKING: Evolving/moderate complexity  EVALUATION COMPLEXITY: Moderate   GOALS: Goals reviewed with patient? Yes  SHORT TERM GOALS: Target date: 04/17/2024 Pt to increase 5xSTS score to at least 11 seconds without the use of bilateral upper extremity support a to improve independence with functional transfers and decrease fall risk.   Baseline: 13.09 seconds  Goal status: INITIAL  2.  Pt will increase BERG score by 3 points to demonstrate improvement in static/dynamic balance so that patient will experience an improvement in daily household tasks and transfers with greater ease.   Baseline: 53/56 Goal status: INITIAL   LONG TERM GOALS: Target date: 05/08/2024  Pt will increase ABC scale score to at least a 68% so that he experiences an improvement in self-confidence with balance related activities.  Baseline: 56.88% Goal status: INITIAL  2.  Pt will improve FGA score to at least a 23/30 so that patient will experience an improvement in dynamic balance and functional tasks with greater ease.   Baseline: 19/30  Goal status: INITIAL  3.  Pt will improve distance by at least 70 ft without the use of an assistive device to improve ability to ambulate community distances safely.   Baseline: 1050 ft Goal status: INITIAL  PLAN:  PT FREQUENCY: 2x/week  PT DURATION: 6 weeks  PLANNED INTERVENTIONS: 97164- PT Re-evaluation, 97110-Therapeutic exercises, 97530- Therapeutic activity, 97112- Neuromuscular re-education, 97535- Self Care, [id number]- Manual therapy, Patient/Family education, Balance training, Stair training, Cryotherapy, and Moist heat  PLAN FOR NEXT SESSION:  Progress static and dynamic balance tasks (focusing on tandem/narrow BOS and eyes closed activities), assess response to HEP, and discuss Friday's (11/21) appointment  Ozell JAYSON Sero, PT, DPT # [id number] [place] Bracket, Student-PT 03/29/2024, 3:30 PM

## 2024-04-03 ENCOUNTER — Encounter: Payer: Self-pay | Admitting: Physical Therapy

## 2024-04-03 ENCOUNTER — Ambulatory Visit: Admitting: Psychiatry

## 2024-04-03 ENCOUNTER — Other Ambulatory Visit: Payer: Self-pay

## 2024-04-03 ENCOUNTER — Encounter: Payer: Self-pay | Admitting: Psychiatry

## 2024-04-03 ENCOUNTER — Ambulatory Visit: Admitting: Physical Therapy

## 2024-04-03 VITALS — BP 132/80 | HR 96 | Temp 97.5°F | Ht 69.0 in | Wt 169.8 lb

## 2024-04-03 DIAGNOSIS — R2689 Other abnormalities of gait and mobility: Secondary | ICD-10-CM

## 2024-04-03 DIAGNOSIS — F411 Generalized anxiety disorder: Secondary | ICD-10-CM | POA: Diagnosis not present

## 2024-04-03 DIAGNOSIS — G4700 Insomnia, unspecified: Secondary | ICD-10-CM | POA: Diagnosis not present

## 2024-04-03 DIAGNOSIS — G2581 Restless legs syndrome: Secondary | ICD-10-CM

## 2024-04-03 DIAGNOSIS — G20C Parkinsonism, unspecified: Secondary | ICD-10-CM

## 2024-04-03 DIAGNOSIS — M6281 Muscle weakness (generalized): Secondary | ICD-10-CM

## 2024-04-03 DIAGNOSIS — G4733 Obstructive sleep apnea (adult) (pediatric): Secondary | ICD-10-CM | POA: Diagnosis not present

## 2024-04-03 DIAGNOSIS — F330 Major depressive disorder, recurrent, mild: Secondary | ICD-10-CM | POA: Diagnosis not present

## 2024-04-03 MED ORDER — RAMELTEON 8 MG PO TABS: 8.0000 mg | ORAL_TABLET | Freq: Every day | ORAL | 1 refills | Status: DC

## 2024-04-03 MED ORDER — CLONAZEPAM 1 MG PO TABS: 1.0000 mg | ORAL_TABLET | Freq: Three times a day (TID) | ORAL | 1 refills | Status: DC | PRN

## 2024-04-03 MED ORDER — SERTRALINE HCL 100 MG PO TABS: 150.0000 mg | ORAL_TABLET | Freq: Every day | ORAL | 3 refills | Status: AC

## 2024-04-03 NOTE — Patient Instructions (Signed)
 Continue sertraline  150 mg at night  Hold Viibryd  Continue bupropion  300 mg daily  Start ramelteon  8 mg at night as needed for insomnia Hold trazodone   Continue clonazepam  1 mg three times a day Next appointment-  1/20 at 10:30

## 2024-04-03 NOTE — Therapy (Signed)
 OUTPATIENT PHYSICAL THERAPY LOWER EXTREMITY TREATMENT  Patient Name: Ricky [name]. MRN: [id number] DOB:Feb 06, 1956, 68 y.o., male Today's Date: 04/03/2024  END OF SESSION:  PT End of Session - 04/03/24 0903     Visit Number 3    Number of Visits 12    Date for Recertification  05/08/24    PT Start Time 0857    PT Stop Time 0942    PT Time Calculation (min) 45 min    Activity Tolerance Patient tolerated treatment well;No increased pain    Behavior During Therapy WFL for tasks assessed/performed         Past Medical History:  Diagnosis Date   Anemia    Anxiety    Arthritis    Carpal tunnel syndrome    Depression    Diabetes (HCC)    Hyperlipidemia    Hypertension    Loss of teeth due to extraction    Neoplasm related pain    Parkinson's disease [facility])    Past Surgical History:  Procedure Laterality Date   CARPAL TUNNEL RELEASE Right 11/28/2020   Procedure: CARPAL TUNNEL RELEASE ENDOSCOPIC;  Surgeon: Edie Norleen PARAS, MD;  Location: [facility] ORS;  Service: Orthopedics;  Laterality: Right;   CARPAL TUNNEL RELEASE Left 01/22/2021   Procedure: CARPAL TUNNEL RELEASE ENDOSCOPIC;  Surgeon: Edie Norleen PARAS, MD;  Location: [facility] ORS;  Service: Orthopedics;  Laterality: Left;   INSERTION, GASTROSTOMY TUBE, ROBOT-ASSISTED N/A 09/30/2023   Procedure: INSERTION, GASTROSTOMY TUBE, ROBOT-ASSISTED;  Surgeon: Jordis Laneta FALCON, MD;  Location: [facility] ORS;  Service: General;  Laterality: N/A;   IR IMAGING GUIDED PORT INSERTION  06/30/2023   IR REMOVAL TUN ACCESS W/ PORT W/O FL MOD SED  12/29/2023   KNEE ARTHROSCOPY Right    KNEE ARTHROSCOPY Left    MANDIBLE FRACTURE SURGERY     as teenager   Patient Active Problem List   Diagnosis Date Noted   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) 06/17/2023   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   Alcohol use disorder, moderate, in sustained remission (HCC) 05/18/2023   Somnolence  01/08/2023   Dyslipidemia 02/02/2022   B12 deficiency 11/14/2021   Overweight (BMI 25.0-29.9) 08/27/2021   Major depression 08/26/2021   Hyponatremia    Parkinson's disease (HCC)    Essential hypertension    Type 2 diabetes mellitus with hyperlipidemia (HCC)    Carpal tunnel syndrome, left 11/08/2020   Chronic bilateral thoracic back pain 10/15/2020   Lumbar spine pain 10/15/2020   Thoracic radiculitis 10/15/2020   Chronic pain syndrome 10/15/2020   Bilateral hand numbness 10/09/2020   Moderate episode of recurrent major depressive disorder (HCC) 03/06/2020   Spinal stenosis of thoracic region 01/18/2020   Neck pain 12/14/2019   Radiculitis of right cervical region 12/14/2019   Erectile dysfunction due to diseases classified elsewhere 05/31/2019   Hx of chronic eczema 05/31/2019   Hyperlipidemia associated with type 2 diabetes mellitus (HCC) 05/31/2019   Carpal tunnel syndrome, right 03/15/2019   Eczema 06/27/2018   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 04/23/2017   Anxiety 10/26/2012   Seborrhea 10/26/2012   Social phobia 10/26/2012   Bell's palsy 11/19/2003    PCP: Fernande Ophelia PARAS DOUGLAS, MD  REFERRING PROVIDER: Fernande Ophelia PARAS DOUGLAS, MD  REFERRING DIAG: G20.C (ICD-10-CM) - Parkinsonism, unspecified Parkinsonism type (HCC)   THERAPY DIAG:  Muscle weakness (generalized)  Balance problem  Other abnormalities of  gait and mobility  Parkinsonism, unspecified Parkinsonism type (HCC)  Rationale for Evaluation and Treatment: Rehabilitation  ONSET DATE: Parkinson's Dx: ~ 4 years ago  SUBJECTIVE:   SUBJECTIVE STATEMENT: Pt presents to physical therapy with referral for Parkinsonism and concerns of general muscle wasting/weakening which has lead to difficulty with prolonged standing, community ambulation, and negotiating stairs at times. Pt was diagnosed with Parkinson's disease roughly 4 years ago. Patient also received treatment for throat  cancer which he partially attributes to feeling weaker, losing weight, and having falls around the house roughly 4 months ago. Patient ended up in the hospital in June 2025 after frequent falls and visual hallucinations where the medications he was taking where adjusted and he has not had any falls since this time. Pt reports feeling unsteady when ambulating or standing for a prolonged period of time along with UE and LE muscles feeling weak.   PERTINENT HISTORY: Patient with PMH including major depression, dysphagia, peripheral neuropathy in feet, and visual impairment (Left eye worse than Right) PAIN:  Are you having pain? No Mild right ankle pain when he wakes up in the morning which improves with movement  PRECAUTIONS: None  RED FLAGS: None   WEIGHT BEARING RESTRICTIONS: No  FALLS:  Has patient fallen in last 6 months? Yes. Number of falls Pt had frequent falls roughly 5 months ago along with hallucinations/altered mental status which resulted in hospitalization in June 2025. Pt has not had any falls since this time period but reports feeling weak.   LIVING ENVIRONMENT: Lives with: lives with their spouse Lives in: House/apartment Stairs: Yes: External: 4-5 steps; can reach both Has following equipment at home: None  OCCUPATION: Mechanic  PLOF: Independent  PATIENT GOALS: To improve independence around household and with community activity and to decrease fall risk.  NEXT MD VISIT: Neurology appointment on 03/28/2024  OBJECTIVE:  Note: Objective measures were completed at Evaluation unless otherwise noted.  PATIENT SURVEYS:  ABC Scale: 56.88%  COGNITION: Overall cognitive status: Within functional limits for tasks assessed     SENSATION: Light touch: WFL  Coordination: Rapid alternating movements of UEs: negative Heel to shin coordination test: negative  POSTURE: rounded shoulders and forward head resting tremor of bilateral UEs noted  LOWER EXTREMITY  ROM:  Active ROM Right eval Left eval  Hip flexion [facility]  Hip extension    Hip abduction    Hip adduction    Hip internal rotation    Hip external rotation    Knee flexion [facility] WFL  Knee extension [facility]  Ankle dorsiflexion [facility] WFL  Ankle plantarflexion    Ankle inversion    Ankle eversion     (Blank rows = not tested)  LOWER EXTREMITY MMT:  MMT Right eval Left eval  Hip flexion 4- 4-  Hip extension    Hip abduction 4 4  Hip adduction    Hip internal rotation 3+ 3+  Hip external rotation 3+ 3+  Knee flexion 5 5  Knee extension 4+ 4+  Ankle dorsiflexion 5 5  Ankle plantarflexion    Ankle inversion    Ankle eversion     (Blank rows = not tested)  FUNCTIONAL TESTS:  5xSTS: 13.09 seconds FGA: 19/30 (biggest challenges with narrow BOS, eyes closed, change in gait speed, and backwards ambulation) mCTSB: moderate sway noted under conditions with EC   GAIT: Distance walked: 30 ft Assistive device utilized: None Level of assistance: Complete Independence Comments: Pt ambulates with reciprocal gait pattern, narrow BOS, decreased  cadence  STAIRS: Patient ascends and descends stairs with step to gait pattern, use of bilateral HRs, and requires increased time to descend safely (decreased eccentric control).  : 1050 ft (fatigue noted upon completion)  BERG: 53/56                                                                                                                              TREATMENT DATE: 04/03/2024   Subjective: Pt denies resting pain or any new falls since last tx session.  No new complaints.    Therapeutic Exercise Nustep L4 10 min. B UE/LE  (discussed weekend activities/ HEP).    (4# AWs) Seated LAQs, 2 x 10   Standing hip abduction/ lateral walking, 2 x 15. Standing hip extension, 2 x 15. Standing hamstring curls, 2 x 15.   Standing Heel raises, 2 x 15  Therapeutic Activity Walking in hallway: cone taps/ Airex/ 6# step ups.  SBA for  safety/ no LOB.    Tandem stance/ gait in //-bars with light to no UE assist.   Added tandem Airex pad for tandem stance (20 sec. Each).  Difficulty with R LE behind L.     STS from gray chair with no UE assist. SBA for safety/cuing.    Lateral resisted walking in // bars, 2 black Tbs, 5 x bilaterally   Forward marching with alternating hand to knee taps, 4x down and back  Reviewed HEP (see below)  PATIENT EDUCATION:  Education details: Pt and spouse of pt educated on PT prognosis and POC Person educated: Patient and Spouse Education method: Explanation and Verbal cues Education comprehension: verbalized understanding  HOME EXERCISE PROGRAM: Access Code: [email or web address] URL: [email or web address] Date: 03/29/2024 Prepared by: Ozell Sero Exercises - Standing Hip Extension with Ankle Weight - 1 x daily - 3-5 x weekly - 2 sets - 15 reps - Standing Hip Abduction AROM - 1 x daily - 3-5 x weekly - 2 sets - 15 reps - Sit to Stand Without Arm Support - 1 x daily - 3-5 x weekly - 2 sets - 10 reps - Seated Long Arc Quad - 1 x daily - 3-5 x weekly - 2 sets - 10 reps - Heel Raises with Counter Support - 1 x daily - 3-5 x weekly - 2 sets - 15 reps    ASSESSMENT:  CLINICAL IMPRESSION: Pt progressing well with LE strengthening with no c/o pain.  Pt. Demonstrates more consistent recip. Gait pattern and good proprioception with use of cones in hallway. Pt. Challenged with tandem stance/gait, esp. With use of Airex pad. Pt reported bilateral LE fatigue at end of tx session, particularly in lateral hip regions. Will continue to monitor and progress to tolerance of patient.   OBJECTIVE IMPAIRMENTS: Abnormal gait, decreased balance, decreased endurance, decreased knowledge of condition, decreased safety awareness, and postural dysfunction.   ACTIVITY LIMITATIONS: carrying, lifting, standing, squatting, sleeping, stairs, transfers, and locomotion level  PARTICIPATION LIMITATIONS: community activity  and occupation  PERSONAL FACTORS: Fitness and 3+ comorbidities: anxiety/depression, Parkinson's disease, HTN, Diabetes, hx of CA, and peripheral neuropathy are also affecting patient's functional outcome.   REHAB POTENTIAL: Good  CLINICAL DECISION MAKING: Evolving/moderate complexity  EVALUATION COMPLEXITY: Moderate   GOALS: Goals reviewed with patient? Yes  SHORT TERM GOALS: Target date: 04/17/2024 Pt to increase 5xSTS score to at least 11 seconds without the use of bilateral upper extremity support a to improve independence with functional transfers and decrease fall risk.   Baseline: 13.09 seconds  Goal status: INITIAL  2.  Pt will increase BERG score by 3 points to demonstrate improvement in static/dynamic balance so that patient will experience an improvement in daily household tasks and transfers with greater ease.   Baseline: 53/56 Goal status: INITIAL   LONG TERM GOALS: Target date: 05/08/2024  Pt will increase ABC scale score to at least a 68% so that he experiences an improvement in self-confidence with balance related activities.  Baseline: 56.88% Goal status: INITIAL  2.  Pt will improve FGA score to at least a 23/30 so that patient will experience an improvement in dynamic balance and functional tasks with greater ease.   Baseline: 19/30  Goal status: INITIAL  3.  Pt will improve distance by at least 70 ft without the use of an assistive device to improve ability to ambulate community distances safely.   Baseline: 1050 ft Goal status: INITIAL  PLAN:  PT FREQUENCY: 2x/week  PT DURATION: 6 weeks  PLANNED INTERVENTIONS: 97164- PT Re-evaluation, 97110-Therapeutic exercises, 97530- Therapeutic activity, 97112- Neuromuscular re-education, 97535- Self Care, [id number]- Manual therapy, Patient/Family education, Balance training, Stair training, Cryotherapy, and Moist heat  PLAN FOR NEXT SESSION: Progress static and dynamic balance tasks (focusing on tandem/narrow  BOS and eyes closed activities).  Ozell JAYSON Sero, PT, DPT # [id number] 04/03/2024, 8:22 PM

## 2024-04-05 ENCOUNTER — Telehealth: Payer: Self-pay

## 2024-04-05 ENCOUNTER — Ambulatory Visit: Admitting: Physical Therapy

## 2024-04-05 ENCOUNTER — Encounter: Payer: Self-pay | Admitting: Physical Therapy

## 2024-04-05 DIAGNOSIS — G20C Parkinsonism, unspecified: Secondary | ICD-10-CM

## 2024-04-05 DIAGNOSIS — M6281 Muscle weakness (generalized): Secondary | ICD-10-CM

## 2024-04-05 DIAGNOSIS — C109 Malignant neoplasm of oropharynx, unspecified: Secondary | ICD-10-CM

## 2024-04-05 DIAGNOSIS — R2689 Other abnormalities of gait and mobility: Secondary | ICD-10-CM

## 2024-04-05 NOTE — Telephone Encounter (Signed)
 Dr. Melanee per OV note dated 03/22/24 Dysphagia. Difficulty swallowing, particularly with bread, requiring water  to aid swallowing. Possible causes include radiation effects, chemotherapy, or Parkinson's disease. Differential diagnosis includes esophageal stricture or other structural issues. - Referred to gastroenterologist for evaluation and possible endoscopy.  I was follow up on his ENT referral and came across this in your note; I didn't see mention of the GI referral in the wrap up.  Would you like me to proceed with processing referral; did not know if you already touched base directly with [facility].  Thank you.

## 2024-04-05 NOTE — Telephone Encounter (Signed)
 Outbound call to [facility] to check status of referral to ENT sent 03/22/24 for periodic checkups for head and neck cancer surveillance. Spoke to Anayli who said they did not receive the referral.  Referral faxed, confirmation received.  Will leave note open to follow up.

## 2024-04-05 NOTE — Therapy (Signed)
 OUTPATIENT PHYSICAL THERAPY LOWER EXTREMITY TREATMENT  Patient Name: Ricky [name]. MRN: [id number] DOB:[birth year], 68 y.o., male Today's Date: 04/05/2024  END OF SESSION:  PT End of Session - 04/05/24 0845     Visit Number 4    Number of Visits 12    Date for Recertification  05/08/24    PT Start Time 0857    PT Stop Time 0943    PT Time Calculation (min) 46 min    Activity Tolerance Patient tolerated treatment well;No increased pain    Behavior During Therapy WFL for tasks assessed/performed         Past Medical History:  Diagnosis Date   Anemia    Anxiety    Arthritis    Carpal tunnel syndrome    Depression    Diabetes (HCC)    Hyperlipidemia    Hypertension    Loss of teeth due to extraction    Neoplasm related pain    Parkinson's disease [facility])    Past Surgical History:  Procedure Laterality Date   CARPAL TUNNEL RELEASE Right 11/28/2020   Procedure: CARPAL TUNNEL RELEASE ENDOSCOPIC;  Surgeon: Edie Norleen PARAS, MD;  Location: [facility] ORS;  Service: Orthopedics;  Laterality: Right;   CARPAL TUNNEL RELEASE Left 01/22/2021   Procedure: CARPAL TUNNEL RELEASE ENDOSCOPIC;  Surgeon: Edie Norleen PARAS, MD;  Location: [facility] ORS;  Service: Orthopedics;  Laterality: Left;   INSERTION, GASTROSTOMY TUBE, ROBOT-ASSISTED N/A 09/30/2023   Procedure: INSERTION, GASTROSTOMY TUBE, ROBOT-ASSISTED;  Surgeon: Jordis Laneta FALCON, MD;  Location: [facility] ORS;  Service: General;  Laterality: N/A;   IR IMAGING GUIDED PORT INSERTION  06/30/2023   IR REMOVAL TUN ACCESS W/ PORT W/O FL MOD SED  12/29/2023   KNEE ARTHROSCOPY Right    KNEE ARTHROSCOPY Left    MANDIBLE FRACTURE SURGERY     as teenager   Patient Active Problem List   Diagnosis Date Noted   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) 06/17/2023   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   Alcohol use disorder, moderate, in sustained remission (HCC) 05/18/2023   Somnolence  01/08/2023   Dyslipidemia 02/02/2022   B12 deficiency 11/14/2021   Overweight (BMI 25.0-29.9) 08/27/2021   Major depression 08/26/2021   Hyponatremia    Parkinson's disease (HCC)    Essential hypertension    Type 2 diabetes mellitus with hyperlipidemia (HCC)    Carpal tunnel syndrome, left 11/08/2020   Chronic bilateral thoracic back pain 10/15/2020   Lumbar spine pain 10/15/2020   Thoracic radiculitis 10/15/2020   Chronic pain syndrome 10/15/2020   Bilateral hand numbness 10/09/2020   Moderate episode of recurrent major depressive disorder (HCC) 03/06/2020   Spinal stenosis of thoracic region 01/18/2020   Neck pain 12/14/2019   Radiculitis of right cervical region 12/14/2019   Erectile dysfunction due to diseases classified elsewhere 05/31/2019   Hx of chronic eczema 05/31/2019   Hyperlipidemia associated with type 2 diabetes mellitus (HCC) 05/31/2019   Carpal tunnel syndrome, right 03/15/2019   Eczema 06/27/2018   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 04/23/2017   Anxiety 10/26/2012   Seborrhea 10/26/2012   Social phobia 10/26/2012   Bell's palsy 11/19/2003    PCP: Fernande Ophelia PARAS DOUGLAS, MD  REFERRING PROVIDER: Fernande Ophelia PARAS DOUGLAS, MD  REFERRING DIAG: G20.C (ICD-10-CM) - Parkinsonism, unspecified Parkinsonism type (HCC)   THERAPY DIAG:  Muscle weakness (generalized)  Balance problem  Other abnormalities of  gait and mobility  Parkinsonism, unspecified Parkinsonism type (HCC)  Rationale for Evaluation and Treatment: Rehabilitation  ONSET DATE: Parkinson's Dx: ~ 4 years ago  SUBJECTIVE:   SUBJECTIVE STATEMENT: Pt presents to physical therapy with referral for Parkinsonism and concerns of general muscle wasting/weakening which has lead to difficulty with prolonged standing, community ambulation, and negotiating stairs at times. Pt was diagnosed with Parkinson's disease roughly 4 years ago. Patient also received treatment for throat  cancer which he partially attributes to feeling weaker, losing weight, and having falls around the house roughly 4 months ago. Patient ended up in the hospital in June 2025 after frequent falls and visual hallucinations where the medications he was taking where adjusted and he has not had any falls since this time. Pt reports feeling unsteady when ambulating or standing for a prolonged period of time along with UE and LE muscles feeling weak.   PERTINENT HISTORY: Patient with PMH including major depression, dysphagia, peripheral neuropathy in feet, and visual impairment (Left eye worse than Right) PAIN:  Are you having pain? No Mild right ankle pain when he wakes up in the morning which improves with movement  PRECAUTIONS: None  RED FLAGS: None   WEIGHT BEARING RESTRICTIONS: No  FALLS:  Has patient fallen in last 6 months? Yes. Number of falls Pt had frequent falls roughly 5 months ago along with hallucinations/altered mental status which resulted in hospitalization in June 2025. Pt has not had any falls since this time period but reports feeling weak.   LIVING ENVIRONMENT: Lives with: lives with their spouse Lives in: House/apartment Stairs: Yes: External: 4-5 steps; can reach both Has following equipment at home: None  OCCUPATION: Mechanic  PLOF: Independent  PATIENT GOALS: To improve independence around household and with community activity and to decrease fall risk.  NEXT MD VISIT: Neurology appointment on 03/28/2024  OBJECTIVE:  Note: Objective measures were completed at Evaluation unless otherwise noted.  PATIENT SURVEYS:  ABC Scale: 56.88%  COGNITION: Overall cognitive status: Within functional limits for tasks assessed     SENSATION: Light touch: WFL  Coordination: Rapid alternating movements of UEs: negative Heel to shin coordination test: negative  POSTURE: rounded shoulders and forward head resting tremor of bilateral UEs noted  LOWER EXTREMITY  ROM:  Active ROM Right eval Left eval  Hip flexion [facility]  Hip extension    Hip abduction    Hip adduction    Hip internal rotation    Hip external rotation    Knee flexion [facility] WFL  Knee extension [facility]  Ankle dorsiflexion [facility] WFL  Ankle plantarflexion    Ankle inversion    Ankle eversion     (Blank rows = not tested)  LOWER EXTREMITY MMT:  MMT Right eval Left eval  Hip flexion 4- 4-  Hip extension    Hip abduction 4 4  Hip adduction    Hip internal rotation 3+ 3+  Hip external rotation 3+ 3+  Knee flexion 5 5  Knee extension 4+ 4+  Ankle dorsiflexion 5 5  Ankle plantarflexion    Ankle inversion    Ankle eversion     (Blank rows = not tested)  FUNCTIONAL TESTS:  5xSTS: 13.09 seconds FGA: 19/30 (biggest challenges with narrow BOS, eyes closed, change in gait speed, and backwards ambulation) mCTSB: moderate sway noted under conditions with EC   GAIT: Distance walked: 30 ft Assistive device utilized: None Level of assistance: Complete Independence Comments: Pt ambulates with reciprocal gait pattern, narrow BOS, decreased  cadence  STAIRS: Patient ascends and descends stairs with step to gait pattern, use of bilateral HRs, and requires increased time to descend safely (decreased eccentric control).  : 1050 ft (fatigue noted upon completion)  BERG: 53/56                                                                                                                              TREATMENT DATE: 04/05/2024    Subjective: Pt denies resting pain or any new falls since last tx session.  Pt. States his calf muscles have been sore since PT on Monday from heel raises.  PT reviewed pts. Daily activity/ walking and discussed importance of staying active.    Therapeutic Exercise Nustep L4 10 min. B UE/LE  (discussed weekend activities/ HEP).    (5# AWs) Seated LAQ/ marching, 25x  Standing hip abduction/ lateral walking, 2 x 15. Standing hip extension, 2 x  15. Standing hamstring curls, 2 x 15.   Standing Heel raises, 2 x 15  Therapeutic Activity Walking in hallway: 6/12 hurdles and addition of 5# ankle wts. (4 laps).  SBA for safety/ no LOB.    Recip. Stair climbing with 5# ankle wts. 4 stairs x 4 with no UE assist.    Nautilus: resisted gait 80# forward/ backwards 5x each.  60# L/R lateral walking 5x each (fatigue noted).   Pt. Challenged with resisted walking and requires SBA for safety/ cuing.   Tandem stance/ gait in //-bars with light to no UE assist.   Added tandem Airex pad for tandem stance (20 sec. Each).  1 LOB with UE assist on //-bars for correction.     STS from gray chair with no UE assist. SBA for safety/cuing.   Discussed HEP  PATIENT EDUCATION:  Education details: Pt and spouse of pt educated on PT prognosis and POC Person educated: Patient and Spouse Education method: Explanation and Verbal cues Education comprehension: verbalized understanding  HOME EXERCISE PROGRAM: Access Code: [email or web address] URL: [email or web address] Date: 03/29/2024 Prepared by: Ozell Sero Exercises - Standing Hip Extension with Ankle Weight - 1 x daily - 3-5 x weekly - 2 sets - 15 reps - Standing Hip Abduction AROM - 1 x daily - 3-5 x weekly - 2 sets - 15 reps - Sit to Stand Without Arm Support - 1 x daily - 3-5 x weekly - 2 sets - 10 reps - Seated Long Arc Quad - 1 x daily - 3-5 x weekly - 2 sets - 10 reps - Heel Raises with Counter Support - 1 x daily - 3-5 x weekly - 2 sets - 15 reps    ASSESSMENT:  CLINICAL IMPRESSION: Pt progressing well with LE strengthening with no c/o pain.  Pt. Has some muscle soreness in calf muscles from recent strengthening ex.  Pt. Had no LOB during tx. Session except during higher level balance tasks in //-bars with Airex pad.  Pt. Able to self-correct with light UE assist on //-  bar.  Pt reported bilateral LE fatigue at end of tx session, particularly in lateral hip regions.  No c/o pain reported.  Will  continue to monitor and progress to tolerance of patient.   OBJECTIVE IMPAIRMENTS: Abnormal gait, decreased balance, decreased endurance, decreased knowledge of condition, decreased safety awareness, and postural dysfunction.   ACTIVITY LIMITATIONS: carrying, lifting, standing, squatting, sleeping, stairs, transfers, and locomotion level  PARTICIPATION LIMITATIONS: community activity and occupation  PERSONAL FACTORS: Fitness and 3+ comorbidities: anxiety/depression, Parkinson's disease, HTN, Diabetes, hx of CA, and peripheral neuropathy are also affecting patient's functional outcome.   REHAB POTENTIAL: Good  CLINICAL DECISION MAKING: Evolving/moderate complexity  EVALUATION COMPLEXITY: Moderate   GOALS: Goals reviewed with patient? Yes  SHORT TERM GOALS: Target date: 04/17/2024 Pt to increase 5xSTS score to at least 11 seconds without the use of bilateral upper extremity support a to improve independence with functional transfers and decrease fall risk.   Baseline: 13.09 seconds  Goal status: INITIAL  2.  Pt will increase BERG score by 3 points to demonstrate improvement in static/dynamic balance so that patient will experience an improvement in daily household tasks and transfers with greater ease.   Baseline: 53/56 Goal status: INITIAL   LONG TERM GOALS: Target date: 05/08/2024  Pt will increase ABC scale score to at least a 68% so that he experiences an improvement in self-confidence with balance related activities.  Baseline: 56.88% Goal status: INITIAL  2.  Pt will improve FGA score to at least a 23/30 so that patient will experience an improvement in dynamic balance and functional tasks with greater ease.   Baseline: 19/30  Goal status: INITIAL  3.  Pt will improve distance by at least 70 ft without the use of an assistive device to improve ability to ambulate community distances safely.   Baseline: 1050 ft Goal status: INITIAL  PLAN:  PT FREQUENCY:  2x/week  PT DURATION: 6 weeks  PLANNED INTERVENTIONS: 97164- PT Re-evaluation, 97110-Therapeutic exercises, 97530- Therapeutic activity, 97112- Neuromuscular re-education, 97535- Self Care, [id number]- Manual therapy, Patient/Family education, Balance training, Stair training, Cryotherapy, and Moist heat  PLAN FOR NEXT SESSION: Progress static and dynamic balance tasks (focusing on tandem/narrow BOS and eyes closed activities).   CHECK STGs  Ozell JAYSON Sero, PT, DPT # [phone] 04/05/2024, 12:13 PM

## 2024-04-09 NOTE — Telephone Encounter (Signed)
 Please check wit patient if they are in the process of seeing GI. If not, please refer

## 2024-04-10 ENCOUNTER — Ambulatory Visit: Admitting: Physical Therapy

## 2024-04-10 NOTE — Telephone Encounter (Signed)
 Per Dr. Melanee Please check wit patient if they are in the process of seeing GI. If not, please refer.  Voice message left for patient; will attempt to contact again shortly.

## 2024-04-11 NOTE — Telephone Encounter (Signed)
 Outbound call, spoke to spouse who indicated they have not heard from GI; referral to be faxed today.  Also spouse asked to obtain name of doctor who put tube down his throat.  Informed would follow up later this week to check status.

## 2024-04-12 ENCOUNTER — Ambulatory Visit: Attending: Internal Medicine | Admitting: Physical Therapy

## 2024-04-12 ENCOUNTER — Encounter: Payer: Self-pay | Admitting: Physical Therapy

## 2024-04-12 DIAGNOSIS — R2689 Other abnormalities of gait and mobility: Secondary | ICD-10-CM | POA: Diagnosis present

## 2024-04-12 DIAGNOSIS — G20C Parkinsonism, unspecified: Secondary | ICD-10-CM | POA: Insufficient documentation

## 2024-04-12 DIAGNOSIS — M6281 Muscle weakness (generalized): Secondary | ICD-10-CM | POA: Diagnosis present

## 2024-04-12 NOTE — Therapy (Signed)
 OUTPATIENT PHYSICAL THERAPY LOWER EXTREMITY TREATMENT  Patient Name: Ricky [name]. MRN: [id number] DOB:Jul 16, 1955, 68 y.o., male Today's Date: 04/12/2024  END OF SESSION:  PT End of Session - 04/12/24 0946     Visit Number 5    Number of Visits 12    Date for Recertification  05/08/24    PT Start Time 0944    PT Stop Time 1028    PT Time Calculation (min) 44 min    Activity Tolerance Patient tolerated treatment well;No increased pain;Patient limited by fatigue    Behavior During Therapy [facility] for tasks assessed/performed         Past Medical History:  Diagnosis Date   Anemia    Anxiety    Arthritis    Carpal tunnel syndrome    Depression    Diabetes (HCC)    Hyperlipidemia    Hypertension    Loss of teeth due to extraction    Neoplasm related pain    Parkinson's disease [facility])    Past Surgical History:  Procedure Laterality Date   CARPAL TUNNEL RELEASE Right 11/28/2020   Procedure: CARPAL TUNNEL RELEASE ENDOSCOPIC;  Surgeon: Edie Norleen PARAS, MD;  Location: [facility] ORS;  Service: Orthopedics;  Laterality: Right;   CARPAL TUNNEL RELEASE Left 01/22/2021   Procedure: CARPAL TUNNEL RELEASE ENDOSCOPIC;  Surgeon: Edie Norleen PARAS, MD;  Location: [facility] OR[facility];  Service: Orthopedics;  Laterality: Left;   INSERTION, GASTROSTOMY TUBE, ROBOT-ASSISTED N/A 09/30/2023   Procedure: INSERTION, GASTROSTOMY TUBE, ROBOT-ASSISTED;  Surgeon: Jordis Laneta FALCON, MD;  Location: [facility] OR[facility];  Service: General;  Laterality: N/A;   IR IMAGING GUIDED PORT INSERTION  06/30/2023   IR REMOVAL TUN ACCESS W/ PORT W/O FL MOD SED  12/29/2023   KNEE ARTHROSCOPY Right    KNEE ARTHROSCOPY Left    MANDIBLE FRACTURE SURGERY     as teenager   Patient Active Problem List   Diagnosis Date Noted   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) 06/17/2023   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   Alcohol use disorder, moderate, in sustained remission (HCC)  05/18/2023   Somnolence 01/08/2023   Dyslipidemia 02/02/2022   B12 deficiency 11/14/2021   Overweight (BMI 25.0-29.9) 08/27/2021   Major depression 08/26/2021   Hyponatremia    Parkinson's disease (HCC)    Essential hypertension    Type 2 diabetes mellitus with hyperlipidemia (HCC)    Carpal tunnel syndrome, left 11/08/2020   Chronic bilateral thoracic back pain 10/15/2020   Lumbar spine pain 10/15/2020   Thoracic radiculitis 10/15/2020   Chronic pain syndrome 10/15/2020   Bilateral hand numbness 10/09/2020   Moderate episode of recurrent major depressive disorder (HCC) 03/06/2020   Spinal stenosis of thoracic region 01/18/2020   Neck pain 12/14/2019   Radiculitis of right cervical region 12/14/2019   Erectile dysfunction due to diseases classified elsewhere 05/31/2019   Hx of chronic eczema 05/31/2019   Hyperlipidemia associated with type 2 diabetes mellitus (HCC) 05/31/2019   Carpal tunnel syndrome, right 03/15/2019   Eczema 06/27/2018   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 04/23/2017   Anxiety 10/26/2012   Seborrhea 10/26/2012   Social phobia 10/26/2012   Bell's palsy 11/19/2003    PCP: Fernande Ophelia PARAS DOUGLAS, MD  REFERRING PROVIDER: Fernande Ophelia PARAS DOUGLAS, MD  REFERRING DIAG: G20.C (ICD-10-CM) - Parkinsonism, unspecified Parkinsonism type (HCC)   THERAPY DIAG:  Muscle weakness (generalized)  Balance problem  Other abnormalities of gait and mobility  Parkinsonism, unspecified Parkinsonism type (HCC)  Rationale for Evaluation and Treatment: Rehabilitation  ONSET DATE: Parkinson's Dx: ~ 4 years ago  SUBJECTIVE:   SUBJECTIVE STATEMENT: Pt presents to physical therapy with referral for Parkinsonism and concerns of general muscle wasting/weakening which has lead to difficulty with prolonged standing, community ambulation, and negotiating stairs at times. Pt was diagnosed with Parkinson's disease roughly 4 years ago. Patient also received  treatment for throat cancer which he partially attributes to feeling weaker, losing weight, and having falls around the house roughly 4 months ago. Patient ended up in the hospital in June 2025 after frequent falls and visual hallucinations where the medications he was taking where adjusted and he has not had any falls since this time. Pt reports feeling unsteady when ambulating or standing for a prolonged period of time along with UE and LE muscles feeling weak.   PERTINENT HISTORY: Patient with PMH including major depression, dysphagia, peripheral neuropathy in feet, and visual impairment (Left eye worse than Right) PAIN:  Are you having pain? No Mild right ankle pain when he wakes up in the morning which improves with movement  PRECAUTIONS: None  RED FLAGS: None   WEIGHT BEARING RESTRICTIONS: No  FALLS:  Has patient fallen in last 6 months? Yes. Number of falls Pt had frequent falls roughly 5 months ago along with hallucinations/altered mental status which resulted in hospitalization in June 2025. Pt has not had any falls since this time period but reports feeling weak.   LIVING ENVIRONMENT: Lives with: lives with their spouse Lives in: House/apartment Stairs: Yes: External: 4-5 steps; can reach both Has following equipment at home: None  OCCUPATION: Mechanic  PLOF: Independent  PATIENT GOALS: To improve independence around household and with community activity and to decrease fall risk.  NEXT MD VISIT: Neurology appointment on 03/28/2024  OBJECTIVE:  Note: Objective measures were completed at Evaluation unless otherwise noted.  PATIENT SURVEYS:  ABC Scale: 56.88%  COGNITION: Overall cognitive status: Within functional limits for tasks assessed     SENSATION: Light touch: WFL  Coordination: Rapid alternating movements of UEs: negative Heel to shin coordination test: negative  POSTURE: rounded shoulders and forward head resting tremor of bilateral UEs noted  LOWER  EXTREMITY ROM:  Active ROM Right eval Left eval  Hip flexion [facility]  Hip extension    Hip abduction    Hip adduction    Hip internal rotation    Hip external rotation    Knee flexion [facility] WFL  Knee extension [facility] [place]  Ankle dorsiflexion [facility] WFL  Ankle plantarflexion    Ankle inversion    Ankle eversion     (Blank rows = not tested)  LOWER EXTREMITY MMT:  MMT Right eval Left eval  Hip flexion 4- 4-  Hip extension    Hip abduction 4 4  Hip adduction    Hip internal rotation 3+ 3+  Hip external rotation 3+ 3+  Knee flexion 5 5  Knee extension 4+ 4+  Ankle dorsiflexion 5 5  Ankle plantarflexion    Ankle inversion    Ankle eversion     (Blank rows = not tested)  FUNCTIONAL TESTS:  5xSTS: 13.09 seconds FGA: 19/30 (biggest challenges with narrow BOS, eyes closed, change in gait speed, and backwards ambulation) mCTSB: moderate sway noted under conditions with EC   GAIT: Distance walked: 30 ft Assistive device utilized: None Level of assistance: Complete Independence Comments: Pt ambulates with reciprocal gait pattern,  narrow BOS, decreased cadence  STAIRS: Patient ascends and descends stairs with step to gait pattern, use of bilateral HRs, and requires increased time to descend safely (decreased eccentric control).  : 1050 ft (fatigue noted upon completion)  BERG: 53/56                                                                                                                              TREATMENT DATE: 04/12/2024    Subjective: Pt denies resting pain or any new falls since last tx session.  Pt. States he is not sleeping well at night and has discussed with MD (prescribed medication which is not helping).     5xSTS: 9.93 second/ 8.08 seconds  Therapeutic Exercise Nustep L5 10 min. B UE/LE  (discussed weekend activities/ HEP/ sleeping issues)- increase resistance today with no issues.     (5# AWs) Seated LAQ/ marching, 25x  Standing hip abduction/  lateral walking, 2 x 15. Standing hip extension, 2 x 15. Standing hamstring curls, 2 x 15.   Standing Heel raises, 2 x 15  Therapeutic Activity Walking in hallway: 6/12 hurdles and addition of 5# ankle wts. (4 laps).  SBA for safety/ no LOB.    Recip. Stair climbing with 5# ankle wts. 4 stairs x 4 with no UE assist.    Nautilus: resisted gait 80# forward/ backwards 3x each.  60# L/R lateral walking 5x each (fatigue noted).   Pt. Challenged with resisted walking and requires SBA for safety/ cuing.   Tandem stance/ gait at agility ladder with no UE assist.  No LOB and able to focus on proper foot placement.    Walking outside on rocks/grassy/parking lot/ curbs with SBA.      Discussed HEP  PATIENT EDUCATION:  Education details: Pt and spouse of pt educated on PT prognosis and POC Person educated: Patient and Spouse Education method: Explanation and Verbal cues Education comprehension: verbalized understanding  HOME EXERCISE PROGRAM: Access Code: [email or web address] URL: [email or web address] Date: 03/29/2024 Prepared by: Ozell Sero Exercises - Standing Hip Extension with Ankle Weight - 1 x daily - 3-5 x weekly - 2 sets - 15 reps - Standing Hip Abduction AROM - 1 x daily - 3-5 x weekly - 2 sets - 15 reps - Sit to Stand Without Arm Support - 1 x daily - 3-5 x weekly - 2 sets - 10 reps - Seated Long Arc Quad - 1 x daily - 3-5 x weekly - 2 sets - 10 reps - Heel Raises with Counter Support - 1 x daily - 3-5 x weekly - 2 sets - 15 reps    ASSESSMENT:  CLINICAL IMPRESSION: Pt progressing well with LE strengthening with no c/o pain.  Pt. Presents with increase generalized fatigue due to not sleeping at night.  Pt. Had no LOB and did well with walking on rocks/ grassy terrain outside.   Pt reported bilateral LE fatigue at end of tx session, particularly in lateral hip  after resisted gait at Nautilus.  No c/o pain reported.  Will continue to monitor and progress to tolerance of patient.    OBJECTIVE IMPAIRMENTS: Abnormal gait, decreased balance, decreased endurance, decreased knowledge of condition, decreased safety awareness, and postural dysfunction.   ACTIVITY LIMITATIONS: carrying, lifting, standing, squatting, sleeping, stairs, transfers, and locomotion level  PARTICIPATION LIMITATIONS: community activity and occupation  PERSONAL FACTORS: Fitness and 3+ comorbidities: anxiety/depression, Parkinson's disease, HTN, Diabetes, hx of CA, and peripheral neuropathy are also affecting patient's functional outcome.   REHAB POTENTIAL: Good  CLINICAL DECISION MAKING: Evolving/moderate complexity  EVALUATION COMPLEXITY: Moderate   GOALS: Goals reviewed with patient? Yes  SHORT TERM GOALS: Target date: 04/17/2024 Pt to increase 5xSTS score to at least 11 seconds without the use of bilateral upper extremity support a to improve independence with functional transfers and decrease fall risk.   Baseline: 13.09 seconds.  12/3: 5xSTS: 9.93 second/ 8.08 seconds Goal status: Goal met  2.  Pt will increase BERG score by 3 points to demonstrate improvement in static/dynamic balance so that patient will experience an improvement in daily household tasks and transfers with greater ease.   Baseline: 53/56 Goal status: INITIAL   LONG TERM GOALS: Target date: 05/08/2024  Pt will increase ABC scale score to at least a 68% so that he experiences an improvement in self-confidence with balance related activities.  Baseline: 56.88% Goal status: INITIAL  2.  Pt will improve FGA score to at least a 23/30 so that patient will experience an improvement in dynamic balance and functional tasks with greater ease.   Baseline: 19/30  Goal status: INITIAL  3.  Pt will improve distance by at least 70 ft without the use of an assistive device to improve ability to ambulate community distances safely.   Baseline: 1050 ft Goal status: INITIAL  PLAN:  PT FREQUENCY: 2x/week  PT DURATION: 6  weeks  PLANNED INTERVENTIONS: 97164- PT Re-evaluation, 97110-Therapeutic exercises, 97530- Therapeutic activity, 97112- Neuromuscular re-education, 97535- Self Care, [id number]- Manual therapy, Patient/Family education, Balance training, Stair training, Cryotherapy, and Moist heat  PLAN FOR NEXT SESSION: Progress static and dynamic balance tasks (focusing on tandem/narrow BOS and eyes closed activities).   Update outcome measures  Ozell JAYSON Sero, PT, DPT # [phone] 04/12/2024, 6:09 PM

## 2024-04-15 ENCOUNTER — Other Ambulatory Visit: Payer: Self-pay | Admitting: Psychiatry

## 2024-04-17 ENCOUNTER — Ambulatory Visit: Admitting: Physical Therapy

## 2024-04-17 ENCOUNTER — Encounter: Payer: Self-pay | Admitting: Oncology

## 2024-04-17 DIAGNOSIS — G20C Parkinsonism, unspecified: Secondary | ICD-10-CM

## 2024-04-17 DIAGNOSIS — M6281 Muscle weakness (generalized): Secondary | ICD-10-CM | POA: Diagnosis not present

## 2024-04-17 DIAGNOSIS — R2689 Other abnormalities of gait and mobility: Secondary | ICD-10-CM

## 2024-04-17 NOTE — Telephone Encounter (Signed)
 Outbound call to spouse [name] informed of below.  Provided telephone number to [facility] and spouse said she would call to schedule an appointment, states they never received a voicemail.

## 2024-04-17 NOTE — Telephone Encounter (Signed)
 Outbound call to check status of referral below.  Spoke to [place] who indicated that the referral was indeed received. A voice message was left for the patient on 04/11/24 but their office has not heard back as of yet.

## 2024-04-17 NOTE — Telephone Encounter (Signed)
 Outbound call to [facility] to check status of GI referral sent 04/11/24 for evaluation and possible endoscopy.  Dysphagia, difficulty swallowing particularly with bread requiring water  to aid swallowing.  Possible causes include radiation effects, chemotherapy or parkinsons disease.  Differential diagnosis includes esophageal stricture or other structural issues.  Spoke to [place] who indicated patient is scheduled to be seen tomorrow 04/18/24 at 10:30am with Kenzie Roberts.

## 2024-04-17 NOTE — Therapy (Signed)
 OUTPATIENT PHYSICAL THERAPY LOWER EXTREMITY TREATMENT  Patient Name: Ricky [name]. MRN: [id number] DOB:[birth year], 68 y.o., male Today's Date: 04/17/2024  END OF SESSION:  PT End of Session - 04/17/24 0939     Visit Number 6    Number of Visits 12    Date for Recertification  05/08/24    PT Start Time 0939    PT Stop Time 1029    PT Time Calculation (min) 50 min    Activity Tolerance Patient tolerated treatment well;No increased pain;Patient limited by fatigue    Behavior During Therapy [facility] for tasks assessed/performed         Past Medical History:  Diagnosis Date   Anemia    Anxiety    Arthritis    Carpal tunnel syndrome    Depression    Diabetes (HCC)    Hyperlipidemia    Hypertension    Loss of teeth due to extraction    Neoplasm related pain    Parkinson's disease [facility])    Past Surgical History:  Procedure Laterality Date   CARPAL TUNNEL RELEASE Right 11/28/2020   Procedure: CARPAL TUNNEL RELEASE ENDOSCOPIC;  Surgeon: Edie Norleen PARAS, MD;  Location: [facility] ORS;  Service: Orthopedics;  Laterality: Right;   CARPAL TUNNEL RELEASE Left 01/22/2021   Procedure: CARPAL TUNNEL RELEASE ENDOSCOPIC;  Surgeon: Edie Norleen PARAS, MD;  Location: [facility] ORS;  Service: Orthopedics;  Laterality: Left;   INSERTION, GASTROSTOMY TUBE, ROBOT-ASSISTED N/A 09/30/2023   Procedure: INSERTION, GASTROSTOMY TUBE, ROBOT-ASSISTED;  Surgeon: Jordis Laneta FALCON, MD;  Location: [facility] ORS;  Service: General;  Laterality: N/A;   IR IMAGING GUIDED PORT INSERTION  06/30/2023   IR REMOVAL TUN ACCESS W/ PORT W/O FL MOD SED  12/29/2023   KNEE ARTHROSCOPY Right    KNEE ARTHROSCOPY Left    MANDIBLE FRACTURE SURGERY     as teenager   Patient Active Problem List   Diagnosis Date Noted   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) 06/17/2023   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   Alcohol use disorder, moderate, in sustained remission (HCC)  05/18/2023   Somnolence 01/08/2023   Dyslipidemia 02/02/2022   B12 deficiency 11/14/2021   Overweight (BMI 25.0-29.9) 08/27/2021   Major depression 08/26/2021   Hyponatremia    Parkinson's disease (HCC)    Essential hypertension    Type 2 diabetes mellitus with hyperlipidemia (HCC)    Carpal tunnel syndrome, left 11/08/2020   Chronic bilateral thoracic back pain 10/15/2020   Lumbar spine pain 10/15/2020   Thoracic radiculitis 10/15/2020   Chronic pain syndrome 10/15/2020   Bilateral hand numbness 10/09/2020   Moderate episode of recurrent major depressive disorder (HCC) 03/06/2020   Spinal stenosis of thoracic region 01/18/2020   Neck pain 12/14/2019   Radiculitis of right cervical region 12/14/2019   Erectile dysfunction due to diseases classified elsewhere 05/31/2019   Hx of chronic eczema 05/31/2019   Hyperlipidemia associated with type 2 diabetes mellitus (HCC) 05/31/2019   Carpal tunnel syndrome, right 03/15/2019   Eczema 06/27/2018   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 04/23/2017   Anxiety 10/26/2012   Seborrhea 10/26/2012   Social phobia 10/26/2012   Bell's palsy 11/19/2003    PCP: Fernande Ophelia PARAS DOUGLAS, MD  REFERRING PROVIDER: Fernande Ophelia PARAS DOUGLAS, MD  REFERRING DIAG: G20.C (ICD-10-CM) - Parkinsonism, unspecified Parkinsonism type (HCC)   THERAPY DIAG:  Muscle weakness (generalized)  Balance problem  Other abnormalities of gait and mobility  Parkinsonism, unspecified Parkinsonism type (HCC)  Rationale for Evaluation and Treatment: Rehabilitation  ONSET DATE: Parkinson's Dx: ~ 4 years ago  SUBJECTIVE:   SUBJECTIVE STATEMENT: Pt presents to physical therapy with referral for Parkinsonism and concerns of general muscle wasting/weakening which has lead to difficulty with prolonged standing, community ambulation, and negotiating stairs at times. Pt was diagnosed with Parkinson's disease roughly 4 years ago. Patient also received  treatment for throat cancer which he partially attributes to feeling weaker, losing weight, and having falls around the house roughly 4 months ago. Patient ended up in the hospital in June 2025 after frequent falls and visual hallucinations where the medications he was taking where adjusted and he has not had any falls since this time. Pt reports feeling unsteady when ambulating or standing for a prolonged period of time along with UE and LE muscles feeling weak.   PERTINENT HISTORY: Patient with PMH including major depression, dysphagia, peripheral neuropathy in feet, and visual impairment (Left eye worse than Right) PAIN:  Are you having pain? No Mild right ankle pain when he wakes up in the morning which improves with movement  PRECAUTIONS: None  RED FLAGS: None   WEIGHT BEARING RESTRICTIONS: No  FALLS:  Has patient fallen in last 6 months? Yes. Number of falls Pt had frequent falls roughly 5 months ago along with hallucinations/altered mental status which resulted in hospitalization in June 2025. Pt has not had any falls since this time period but reports feeling weak.   LIVING ENVIRONMENT: Lives with: lives with their spouse Lives in: House/apartment Stairs: Yes: External: 4-5 steps; can reach both Has following equipment at home: None  OCCUPATION: Mechanic  PLOF: Independent  PATIENT GOALS: To improve independence around household and with community activity and to decrease fall risk.  NEXT MD VISIT: Neurology appointment on 03/28/2024  OBJECTIVE:  Note: Objective measures were completed at Evaluation unless otherwise noted.  PATIENT SURVEYS:  ABC Scale: 56.88%  COGNITION: Overall cognitive status: Within functional limits for tasks assessed     SENSATION: Light touch: WFL  Coordination: Rapid alternating movements of UEs: negative Heel to shin coordination test: negative  POSTURE: rounded shoulders and forward head resting tremor of bilateral UEs noted  LOWER  EXTREMITY ROM:  Active ROM Right eval Left eval  Hip flexion [facility]  Hip extension    Hip abduction    Hip adduction    Hip internal rotation    Hip external rotation    Knee flexion [facility] WFL  Knee extension [facility]  Ankle dorsiflexion [facility] WFL  Ankle plantarflexion    Ankle inversion    Ankle eversion     (Blank rows = not tested)  LOWER EXTREMITY MMT:  MMT Right eval Left eval  Hip flexion 4- 4-  Hip extension    Hip abduction 4 4  Hip adduction    Hip internal rotation 3+ 3+  Hip external rotation 3+ 3+  Knee flexion 5 5  Knee extension 4+ 4+  Ankle dorsiflexion 5 5  Ankle plantarflexion    Ankle inversion    Ankle eversion     (Blank rows = not tested)  FUNCTIONAL TESTS:  5xSTS: 13.09 seconds FGA: 19/30 (biggest challenges with narrow BOS, eyes closed, change in gait speed, and backwards ambulation) mCTSB: moderate sway noted under conditions with EC   GAIT: Distance walked: 30 ft Assistive device utilized: None Level of assistance: Complete Independence Comments: Pt ambulates with reciprocal gait pattern,  narrow BOS, decreased cadence  STAIRS: Patient ascends and descends stairs with step to gait pattern, use of bilateral HRs, and requires increased time to descend safely (decreased eccentric control).  : 1050 ft (fatigue noted upon completion)  BERG: 53/56  5xSTS: 9.93 second/ 8.08 seconds                                                                                                                              TREATMENT DATE: 04/17/2024    Subjective:  Pt reports no pain and no recent LOB or falls.  Pt. States he was somewhat active over the weekend.  Pt. Has a scheduled Endoscopy tomorrow.    ABC:  95% marked improvement  Therapeutic Exercise Nustep L5 10 min. B UE/LE  (discussed weekend activities/ HEP/ sleeping issues)- no rest breaks or c/o pain.     (5# AWs) Seated LAQ/ marching, 25x  Standing hip abduction/ lateral walking, 2 x  15. Standing hip extension, 2 x 15. Standing hamstring curls, 2 x 15.   Standing Heel raises, 2 x 15  See updated HEP  Therapeutic Activity  Walking in hallway with 5# ankle wts/ 6 step ups and overs in //-bars with no UE assist.  BOSU step ups/ reverse BOSU wt. Shifting and partial squats (fatigue noted).    Nautilus: resisted gait 80# forward/ backwards 3x each.  60# L/R lateral walking 5x each (fatigue noted).   Pt. Challenged with resisted walking and requires SBA for safety/ cuing.     Discussed HEP  PATIENT EDUCATION:  Education details: Pt and spouse of pt educated on PT prognosis and POC Person educated: Patient and Spouse Education method: Explanation and Verbal cues Education comprehension: verbalized understanding  HOME EXERCISE PROGRAM: Access Code: [email or web address] URL: [email or web address] Date: 03/29/2024 Prepared by: Ozell Sero Exercises - Standing Hip Extension with Ankle Weight - 1 x daily - 3-5 x weekly - 2 sets - 15 reps - Standing Hip Abduction AROM - 1 x daily - 3-5 x weekly - 2 sets - 15 reps - Sit to Stand Without Arm Support - 1 x daily - 3-5 x weekly - 2 sets - 10 reps - Seated Long Arc Quad - 1 x daily - 3-5 x weekly - 2 sets - 10 reps - Heel Raises with Counter Support - 1 x daily - 3-5 x weekly - 2 sets - 15 reps    Access Code: [email or web address] URL: [email or web address] Date: 04/17/2024 Prepared by: Ozell Sero  Exercises - Standing Marching  - 1 x daily - 3-5 x weekly - 2 sets - 15 reps - Standing Hip Extension with Ankle Weight  - 1 x daily - 3-5 x weekly - 2 sets - 15 reps - Standing Hip Abduction AROM  - 1 x daily - 3-5 x weekly - 2 sets - 15 reps - Heel Raises with Counter Support  - 1 x daily - 3-5 x weekly - 2 sets - 15 reps -  Sit to Stand Without Arm Support  - 1 x daily - 3-5 x weekly - 2 sets - 10 reps - Seated Long Arc Quad  - 1 x daily - 3-5 x weekly - 2 sets - 10 reps  ASSESSMENT:  CLINICAL IMPRESSION: Pt  progressing well with LE strengthening with no c/o pain.  Pt. Presents with increase generalized fatigue with increase resisted tasks.  Pt. Had no LOB and did well with higher level dynamic tasks.  Pt wil bilateral LE fatigue at end of tx session, particularly in lateral hip after resisted gait at Nautilus.  No c/o pain reported.  Will continue to monitor and progress to tolerance of patient.   OBJECTIVE IMPAIRMENTS: Abnormal gait, decreased balance, decreased endurance, decreased knowledge of condition, decreased safety awareness, and postural dysfunction.   ACTIVITY LIMITATIONS: carrying, lifting, standing, squatting, sleeping, stairs, transfers, and locomotion level  PARTICIPATION LIMITATIONS: community activity and occupation  PERSONAL FACTORS: Fitness and 3+ comorbidities: anxiety/depression, Parkinson's disease, HTN, Diabetes, hx of CA, and peripheral neuropathy are also affecting patient's functional outcome.   REHAB POTENTIAL: Good  CLINICAL DECISION MAKING: Evolving/moderate complexity  EVALUATION COMPLEXITY: Moderate   GOALS: Goals reviewed with patient? Yes  SHORT TERM GOALS: Target date: 04/17/2024 Pt to increase 5xSTS score to at least 11 seconds without the use of bilateral upper extremity support a to improve independence with functional transfers and decrease fall risk.   Baseline: 13.09 seconds.  12/3: 5xSTS: 9.93 second/ 8.08 seconds Goal status: Goal met  2.  Pt will increase BERG score by 3 points to demonstrate improvement in static/dynamic balance so that patient will experience an improvement in daily household tasks and transfers with greater ease.   Baseline: 53/56 Goal status: INITIAL   LONG TERM GOALS: Target date: 05/08/2024  Pt will increase ABC scale score to at least a 68% so that he experiences an improvement in self-confidence with balance related activities.  Baseline: 56.88%.  12/8: 95%  Goal status: Goal met  2.  Pt will improve FGA score to at  least a 23/30 so that patient will experience an improvement in dynamic balance and functional tasks with greater ease.   Baseline: 19/30  Goal status: INITIAL  3.  Pt will improve distance by at least 70 ft without the use of an assistive device to improve ability to ambulate community distances safely.   Baseline: 1050 ft Goal status: INITIAL  PLAN:  PT FREQUENCY: 2x/week  PT DURATION: 6 weeks  PLANNED INTERVENTIONS: 97164- PT Re-evaluation, 97110-Therapeutic exercises, 97530- Therapeutic activity, 97112- Neuromuscular re-education, 97535- Self Care, [id number]- Manual therapy, Patient/Family education, Balance training, Stair training, Cryotherapy, and Moist heat  PLAN FOR NEXT SESSION: Progress static and dynamic balance tasks (focusing on tandem/narrow BOS and eyes closed activities).   Update Berg/  Ozell JAYSON Sero, PT, DPT # [phone] 04/17/2024, 1:07 PM

## 2024-04-19 ENCOUNTER — Encounter: Payer: Self-pay | Admitting: Physical Therapy

## 2024-04-19 ENCOUNTER — Ambulatory Visit: Admitting: Physical Therapy

## 2024-04-19 DIAGNOSIS — M6281 Muscle weakness (generalized): Secondary | ICD-10-CM

## 2024-04-19 DIAGNOSIS — R2689 Other abnormalities of gait and mobility: Secondary | ICD-10-CM

## 2024-04-19 DIAGNOSIS — G20C Parkinsonism, unspecified: Secondary | ICD-10-CM

## 2024-04-19 NOTE — Therapy (Signed)
 OUTPATIENT PHYSICAL THERAPY LOWER EXTREMITY TREATMENT  Patient Name: Ricky [name]. MRN: [id number] DOB:1955-08-27, 68 y.o., male Today's Date: 04/19/2024  END OF SESSION:  PT End of Session - 04/19/24 0940     Visit Number 7    Number of Visits 12    Date for Recertification  05/08/24    PT Start Time 0940    Activity Tolerance Patient tolerated treatment well;No increased pain;Patient limited by fatigue    Behavior During Therapy [facility] for tasks assessed/performed         Past Medical History:  Diagnosis Date   Anemia    Anxiety    Arthritis    Carpal tunnel syndrome    Depression    Diabetes (HCC)    Hyperlipidemia    Hypertension    Loss of teeth due to extraction    Neoplasm related pain    Parkinson's disease [facility])    Past Surgical History:  Procedure Laterality Date   CARPAL TUNNEL RELEASE Right 11/28/2020   Procedure: CARPAL TUNNEL RELEASE ENDOSCOPIC;  Surgeon: Edie Norleen PARAS, MD;  Location: [facility] ORS;  Service: Orthopedics;  Laterality: Right;   CARPAL TUNNEL RELEASE Left 01/22/2021   Procedure: CARPAL TUNNEL RELEASE ENDOSCOPIC;  Surgeon: Edie Norleen PARAS, MD;  Location: [facility] ORS;  Service: Orthopedics;  Laterality: Left;   INSERTION, GASTROSTOMY TUBE, ROBOT-ASSISTED N/A 09/30/2023   Procedure: INSERTION, GASTROSTOMY TUBE, ROBOT-ASSISTED;  Surgeon: Jordis Laneta FALCON, MD;  Location: [facility] ORS;  Service: General;  Laterality: N/A;   IR IMAGING GUIDED PORT INSERTION  06/30/2023   IR REMOVAL TUN ACCESS W/ PORT W/O FL MOD SED  12/29/2023   KNEE ARTHROSCOPY Right    KNEE ARTHROSCOPY Left    MANDIBLE FRACTURE SURGERY     as teenager   Patient Active Problem List   Diagnosis Date Noted   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) 06/17/2023   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   Alcohol use disorder, moderate, in sustained remission (HCC) 05/18/2023   Somnolence 01/08/2023   Dyslipidemia  02/02/2022   B12 deficiency 11/14/2021   Overweight (BMI 25.0-29.9) 08/27/2021   Major depression 08/26/2021   Hyponatremia    Parkinson's disease (HCC)    Essential hypertension    Type 2 diabetes mellitus with hyperlipidemia (HCC)    Carpal tunnel syndrome, left 11/08/2020   Chronic bilateral thoracic back pain 10/15/2020   Lumbar spine pain 10/15/2020   Thoracic radiculitis 10/15/2020   Chronic pain syndrome 10/15/2020   Bilateral hand numbness 10/09/2020   Moderate episode of recurrent major depressive disorder (HCC) 03/06/2020   Spinal stenosis of thoracic region 01/18/2020   Neck pain 12/14/2019   Radiculitis of right cervical region 12/14/2019   Erectile dysfunction due to diseases classified elsewhere 05/31/2019   Hx of chronic eczema 05/31/2019   Hyperlipidemia associated with type 2 diabetes mellitus (HCC) 05/31/2019   Carpal tunnel syndrome, right 03/15/2019   Eczema 06/27/2018   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 04/23/2017   Anxiety 10/26/2012   Seborrhea 10/26/2012   Social phobia 10/26/2012   Bell's palsy 11/19/2003    PCP: Fernande Ophelia PARAS DOUGLAS, MD  REFERRING PROVIDER: Fernande Ophelia PARAS DOUGLAS, MD  REFERRING DIAG: G20.C (ICD-10-CM) - Parkinsonism, unspecified Parkinsonism type (HCC)   THERAPY DIAG:  Muscle weakness (generalized)  Balance problem  Other abnormalities of gait and mobility  Parkinsonism, unspecified Parkinsonism type (HCC)  Rationale for Evaluation  and Treatment: Rehabilitation  ONSET DATE: Parkinson's Dx: ~ 4 years ago  SUBJECTIVE:   SUBJECTIVE STATEMENT: Pt presents to physical therapy with referral for Parkinsonism and concerns of general muscle wasting/weakening which has lead to difficulty with prolonged standing, community ambulation, and negotiating stairs at times. Pt was diagnosed with Parkinson's disease roughly 4 years ago. Patient also received treatment for throat cancer which he partially  attributes to feeling weaker, losing weight, and having falls around the house roughly 4 months ago. Patient ended up in the hospital in June 2025 after frequent falls and visual hallucinations where the medications he was taking where adjusted and he has not had any falls since this time. Pt reports feeling unsteady when ambulating or standing for a prolonged period of time along with UE and LE muscles feeling weak.   PERTINENT HISTORY: Patient with PMH including major depression, dysphagia, peripheral neuropathy in feet, and visual impairment (Left eye worse than Right) PAIN:  Are you having pain? No Mild right ankle pain when he wakes up in the morning which improves with movement  PRECAUTIONS: None  RED FLAGS: None   WEIGHT BEARING RESTRICTIONS: No  FALLS:  Has patient fallen in last 6 months? Yes. Number of falls Pt had frequent falls roughly 5 months ago along with hallucinations/altered mental status which resulted in hospitalization in June 2025. Pt has not had any falls since this time period but reports feeling weak.   LIVING ENVIRONMENT: Lives with: lives with their spouse Lives in: House/apartment Stairs: Yes: External: 4-5 steps; can reach both Has following equipment at home: None  OCCUPATION: Mechanic  PLOF: Independent  PATIENT GOALS: To improve independence around household and with community activity and to decrease fall risk.  NEXT MD VISIT: Neurology appointment on 03/28/2024  OBJECTIVE:  Note: Objective measures were completed at Evaluation unless otherwise noted.  PATIENT SURVEYS:  ABC Scale: 56.88%  COGNITION: Overall cognitive status: Within functional limits for tasks assessed     SENSATION: Light touch: WFL  Coordination: Rapid alternating movements of UEs: negative Heel to shin coordination test: negative  POSTURE: rounded shoulders and forward head resting tremor of bilateral UEs noted  LOWER EXTREMITY ROM:  Active ROM Right eval  Left eval  Hip flexion [facility]  Hip extension    Hip abduction    Hip adduction    Hip internal rotation    Hip external rotation    Knee flexion [facility] WFL  Knee extension [facility]  Ankle dorsiflexion [facility] WFL  Ankle plantarflexion    Ankle inversion    Ankle eversion     (Blank rows = not tested)  LOWER EXTREMITY MMT:  MMT Right eval Left eval  Hip flexion 4- 4-  Hip extension    Hip abduction 4 4  Hip adduction    Hip internal rotation 3+ 3+  Hip external rotation 3+ 3+  Knee flexion 5 5  Knee extension 4+ 4+  Ankle dorsiflexion 5 5  Ankle plantarflexion    Ankle inversion    Ankle eversion     (Blank rows = not tested)  FUNCTIONAL TESTS:  5xSTS: 13.09 seconds FGA: 19/30 (biggest challenges with narrow BOS, eyes closed, change in gait speed, and backwards ambulation) mCTSB: moderate sway noted under conditions with EC   GAIT: Distance walked: 30 ft Assistive device utilized: None Level of assistance: Complete Independence Comments: Pt ambulates with reciprocal gait pattern, narrow BOS, decreased cadence  STAIRS: Patient ascends and descends stairs with step to gait pattern,  use of bilateral HRs, and requires increased time to descend safely (decreased eccentric control).  : 1050 ft (fatigue noted upon completion)  BERG: 53/56  5xSTS: 9.93 second/ 8.08 seconds  ABC:  95% marked improvement                                                                                                                              TREATMENT DATE: 04/19/2024    Subjective:  Pt reports no pain and no recent LOB or falls.  Pt. States he was somewhat active over the weekend.  Pt. Has a scheduled Endoscopy tomorrow.    Therapeutic Activity Walking in hallway with and without 5# ankle wts/ 6 step ups and overs in //-bars with no UE assist.  TG:   BOSU step ups/ reverse BOSU wt. Shifting and partial squats (fatigue noted).    Nautilus: resisted gait 95# forward/ backwards  3x each.  70# L/R lateral walking 5x each (fatigue noted).   Pt. Challenged with resisted walking and requires SBA for safety/ cuing.     Discussed HEP  Therapeutic Exercise Nustep L6 10 min. B UE/LE  (discussed weekend activities/ HEP/ sleeping issues)- no rest breaks or c/o pain.     (5# AWs) Seated LAQ/ marching, 25x  Standing hip abduction/ lateral walking, 2 x 15. Standing hip extension, 2 x 15. Standing hamstring curls, 2 x 15.   Standing Heel raises, 2 x 15  See updated HEP  PATIENT EDUCATION:  Education details: Pt and spouse of pt educated on PT prognosis and POC Person educated: Patient and Spouse Education method: Explanation and Verbal cues Education comprehension: verbalized understanding  HOME EXERCISE PROGRAM: Access Code: [email or web address] URL: [email or web address] Date: 03/29/2024 Prepared by: Ozell Sero Exercises - Standing Hip Extension with Ankle Weight - 1 x daily - 3-5 x weekly - 2 sets - 15 reps - Standing Hip Abduction AROM - 1 x daily - 3-5 x weekly - 2 sets - 15 reps - Sit to Stand Without Arm Support - 1 x daily - 3-5 x weekly - 2 sets - 10 reps - Seated Long Arc Quad - 1 x daily - 3-5 x weekly - 2 sets - 10 reps - Heel Raises with Counter Support - 1 x daily - 3-5 x weekly - 2 sets - 15 reps    Access Code: [email or web address] URL: [email or web address] Date: 04/17/2024 Prepared by: Ozell Sero  Exercises - Standing Marching  - 1 x daily - 3-5 x weekly - 2 sets - 15 reps - Standing Hip Extension with Ankle Weight  - 1 x daily - 3-5 x weekly - 2 sets - 15 reps - Standing Hip Abduction AROM  - 1 x daily - 3-5 x weekly - 2 sets - 15 reps - Heel Raises with Counter Support  - 1 x daily - 3-5 x weekly - 2 sets - 15 reps - Sit to Stand Without Arm Support  - 1 x daily -  3-5 x weekly - 2 sets - 10 reps - Seated Long Arc Quad  - 1 x daily - 3-5 x weekly - 2 sets - 10 reps  ASSESSMENT:  CLINICAL IMPRESSION: Pt progressing well with LE  strengthening with no c/o pain.  Pt. Presents with increase generalized fatigue with increase resisted tasks.  Pt. Had no LOB and did well with higher level dynamic tasks.  Pt wil bilateral LE fatigue at end of tx session, particularly in lateral hip after resisted gait at Nautilus.  No c/o pain reported.  Will continue to monitor and progress to tolerance of patient.   OBJECTIVE IMPAIRMENTS: Abnormal gait, decreased balance, decreased endurance, decreased knowledge of condition, decreased safety awareness, and postural dysfunction.   ACTIVITY LIMITATIONS: carrying, lifting, standing, squatting, sleeping, stairs, transfers, and locomotion level  PARTICIPATION LIMITATIONS: community activity and occupation  PERSONAL FACTORS: Fitness and 3+ comorbidities: anxiety/depression, Parkinson's disease, HTN, Diabetes, hx of CA, and peripheral neuropathy are also affecting patient's functional outcome.   REHAB POTENTIAL: Good  CLINICAL DECISION MAKING: Evolving/moderate complexity  EVALUATION COMPLEXITY: Moderate   GOALS: Goals reviewed with patient? Yes  SHORT TERM GOALS: Target date: 04/17/2024 Pt to increase 5xSTS score to at least 11 seconds without the use of bilateral upper extremity support a to improve independence with functional transfers and decrease fall risk.   Baseline: 13.09 seconds.  12/3: 5xSTS: 9.93 second/ 8.08 seconds Goal status: Goal met  2.  Pt will increase BERG score by 3 points to demonstrate improvement in static/dynamic balance so that patient will experience an improvement in daily household tasks and transfers with greater ease.   Baseline: 53/56 Goal status: INITIAL   LONG TERM GOALS: Target date: 05/08/2024  Pt will increase ABC scale score to at least a 68% so that he experiences an improvement in self-confidence with balance related activities.  Baseline: 56.88%.  12/8: 95%  Goal status: Goal met  2.  Pt will improve FGA score to at least a 23/30 so that  patient will experience an improvement in dynamic balance and functional tasks with greater ease.   Baseline: 19/30  Goal status: INITIAL  3.  Pt will improve distance by at least 70 ft without the use of an assistive device to improve ability to ambulate community distances safely.   Baseline: 1050 ft Goal status: INITIAL  PLAN:  PT FREQUENCY: 2x/week  PT DURATION: 6 weeks  PLANNED INTERVENTIONS: 97164- PT Re-evaluation, 97110-Therapeutic exercises, 97530- Therapeutic activity, 97112- Neuromuscular re-education, 97535- Self Care, [id number]- Manual therapy, Patient/Family education, Balance training, Stair training, Cryotherapy, and Moist heat  PLAN FOR NEXT SESSION: Progress static and dynamic balance tasks (focusing on tandem/narrow BOS and eyes closed activities).   Update Berg/  Ozell JAYSON Sero, PT, DPT # [id number] 04/19/2024, 9:40 AM

## 2024-04-21 ENCOUNTER — Encounter: Payer: Self-pay | Admitting: Oncology

## 2024-04-24 ENCOUNTER — Ambulatory Visit: Admitting: Physical Therapy

## 2024-04-24 ENCOUNTER — Encounter: Payer: Self-pay | Admitting: Physical Therapy

## 2024-04-24 DIAGNOSIS — M6281 Muscle weakness (generalized): Secondary | ICD-10-CM

## 2024-04-24 DIAGNOSIS — R2689 Other abnormalities of gait and mobility: Secondary | ICD-10-CM

## 2024-04-24 DIAGNOSIS — G20C Parkinsonism, unspecified: Secondary | ICD-10-CM

## 2024-04-24 NOTE — Therapy (Signed)
 OUTPATIENT PHYSICAL THERAPY LOWER EXTREMITY TREATMENT  Patient Name: Ricky [name]. MRN: [id number] DOB:July 13, 1955, 68 y.o., male Today's Date: 04/24/2024  END OF SESSION:  PT End of Session - 04/24/24 0951     Visit Number 8    Number of Visits 12    Date for Recertification  05/08/24    PT Start Time 0947    PT Stop Time 1032    PT Time Calculation (min) 45 min    Activity Tolerance Patient tolerated treatment well;No increased pain;Patient limited by fatigue    Behavior During Therapy [facility] for tasks assessed/performed         Past Medical History:  Diagnosis Date   Anemia    Anxiety    Arthritis    Carpal tunnel syndrome    Depression    Diabetes (HCC)    Hyperlipidemia    Hypertension    Loss of teeth due to extraction    Neoplasm related pain    Parkinson's disease [facility])    Past Surgical History:  Procedure Laterality Date   CARPAL TUNNEL RELEASE Right 11/28/2020   Procedure: CARPAL TUNNEL RELEASE ENDOSCOPIC;  Surgeon: Edie Norleen PARAS, MD;  Location: [facility] ORS;  Service: Orthopedics;  Laterality: Right;   CARPAL TUNNEL RELEASE Left 01/22/2021   Procedure: CARPAL TUNNEL RELEASE ENDOSCOPIC;  Surgeon: Edie Norleen PARAS, MD;  Location: [facility] ORS;  Service: Orthopedics;  Laterality: Left;   INSERTION, GASTROSTOMY TUBE, ROBOT-ASSISTED N/A 09/30/2023   Procedure: INSERTION, GASTROSTOMY TUBE, ROBOT-ASSISTED;  Surgeon: Jordis Laneta FALCON, MD;  Location: [facility] ORS;  Service: General;  Laterality: N/A;   IR IMAGING GUIDED PORT INSERTION  06/30/2023   IR REMOVAL TUN ACCESS W/ PORT W/O FL MOD SED  12/29/2023   KNEE ARTHROSCOPY Right    KNEE ARTHROSCOPY Left    MANDIBLE FRACTURE SURGERY     as teenager   Patient Active Problem List   Diagnosis Date Noted   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) 06/17/2023   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   Alcohol use disorder, moderate, in sustained remission (HCC)  05/18/2023   Somnolence 01/08/2023   Dyslipidemia 02/02/2022   B12 deficiency 11/14/2021   Overweight (BMI 25.0-29.9) 08/27/2021   Major depression 08/26/2021   Hyponatremia    Parkinson's disease (HCC)    Essential hypertension    Type 2 diabetes mellitus with hyperlipidemia (HCC)    Carpal tunnel syndrome, left 11/08/2020   Chronic bilateral thoracic back pain 10/15/2020   Lumbar spine pain 10/15/2020   Thoracic radiculitis 10/15/2020   Chronic pain syndrome 10/15/2020   Bilateral hand numbness 10/09/2020   Moderate episode of recurrent major depressive disorder (HCC) 03/06/2020   Spinal stenosis of thoracic region 01/18/2020   Neck pain 12/14/2019   Radiculitis of right cervical region 12/14/2019   Erectile dysfunction due to diseases classified elsewhere 05/31/2019   Hx of chronic eczema 05/31/2019   Hyperlipidemia associated with type 2 diabetes mellitus (HCC) 05/31/2019   Carpal tunnel syndrome, right 03/15/2019   Eczema 06/27/2018   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 04/23/2017   Anxiety 10/26/2012   Seborrhea 10/26/2012   Social phobia 10/26/2012   Bell's palsy 11/19/2003    PCP: Fernande Ophelia PARAS DOUGLAS, MD  REFERRING PROVIDER: Fernande Ophelia PARAS DOUGLAS, MD  REFERRING DIAG: G20.C (ICD-10-CM) - Parkinsonism, unspecified Parkinsonism type (HCC)   THERAPY DIAG:  Muscle weakness (generalized)  Balance problem  Other abnormalities of gait and mobility  Parkinsonism, unspecified Parkinsonism type (HCC)  Rationale for Evaluation and Treatment: Rehabilitation  ONSET DATE: Parkinson's Dx: ~ 4 years ago  SUBJECTIVE:   SUBJECTIVE STATEMENT: Pt presents to physical therapy with referral for Parkinsonism and concerns of general muscle wasting/weakening which has lead to difficulty with prolonged standing, community ambulation, and negotiating stairs at times. Pt was diagnosed with Parkinson's disease roughly 4 years ago. Patient also received  treatment for throat cancer which he partially attributes to feeling weaker, losing weight, and having falls around the house roughly 4 months ago. Patient ended up in the hospital in June 2025 after frequent falls and visual hallucinations where the medications he was taking where adjusted and he has not had any falls since this time. Pt reports feeling unsteady when ambulating or standing for a prolonged period of time along with UE and LE muscles feeling weak.   PERTINENT HISTORY: Patient with PMH including major depression, dysphagia, peripheral neuropathy in feet, and visual impairment (Left eye worse than Right) PAIN:  Are you having pain? No Mild right ankle pain when he wakes up in the morning which improves with movement  PRECAUTIONS: None  RED FLAGS: None   WEIGHT BEARING RESTRICTIONS: No  FALLS:  Has patient fallen in last 6 months? Yes. Number of falls Pt had frequent falls roughly 5 months ago along with hallucinations/altered mental status which resulted in hospitalization in June 2025. Pt has not had any falls since this time period but reports feeling weak.   LIVING ENVIRONMENT: Lives with: lives with their spouse Lives in: House/apartment Stairs: Yes: External: 4-5 steps; can reach both Has following equipment at home: None  OCCUPATION: Mechanic  PLOF: Independent  PATIENT GOALS: To improve independence around household and with community activity and to decrease fall risk.  NEXT MD VISIT: Neurology appointment on 03/28/2024  OBJECTIVE:  Note: Objective measures were completed at Evaluation unless otherwise noted.  PATIENT SURVEYS:  ABC Scale: 56.88%  COGNITION: Overall cognitive status: Within functional limits for tasks assessed     SENSATION: Light touch: WFL  Coordination: Rapid alternating movements of UEs: negative Heel to shin coordination test: negative  POSTURE: rounded shoulders and forward head resting tremor of bilateral UEs noted  LOWER  EXTREMITY ROM:  Active ROM Right eval Left eval  Hip flexion [facility]  Hip extension    Hip abduction    Hip adduction    Hip internal rotation    Hip external rotation    Knee flexion [facility] WFL  Knee extension [facility]  Ankle dorsiflexion [facility] WFL  Ankle plantarflexion    Ankle inversion    Ankle eversion     (Blank rows = not tested)  LOWER EXTREMITY MMT:  MMT Right eval Left eval  Hip flexion 4- 4-  Hip extension    Hip abduction 4 4  Hip adduction    Hip internal rotation 3+ 3+  Hip external rotation 3+ 3+  Knee flexion 5 5  Knee extension 4+ 4+  Ankle dorsiflexion 5 5  Ankle plantarflexion    Ankle inversion    Ankle eversion     (Blank rows = not tested)  FUNCTIONAL TESTS:  5xSTS: 13.09 seconds FGA: 19/30 (biggest challenges with narrow BOS, eyes closed, change in gait speed, and backwards ambulation) mCTSB: moderate sway noted under conditions with EC   GAIT: Distance walked: 30 ft Assistive device utilized: None Level of assistance: Complete Independence Comments: Pt ambulates with reciprocal gait pattern,  narrow BOS, decreased cadence  STAIRS: Patient ascends and descends stairs with step to gait pattern, use of bilateral HRs, and requires increased time to descend safely (decreased eccentric control).  : 1050 ft (fatigue noted upon completion)  BERG: 53/56  5xSTS: 9.93 second/ 8.08 seconds  ABC:  95% marked improvement                                                                                                                              TREATMENT DATE: 04/24/2024    Subjective:  Pt reports no pain and no recent LOB or falls.  Pt. States he was somewhat active over the weekend.  No new complaints.  Pt. Did not sleep well last night.    : 1,152 feet (no c/o fatigue today).   Berg balance:  55/56 (marked improvement)  Therapeutic Activity Walking in hallway ( )- improvement noted  TG: squats / heel raises 30x each.   Walking  lunges at agility ladder 4 laps.  SBA/CGA for safety and cuing.    Step ups/down on 12 step (forward)- no UE assist.     Therapeutic Exercise Nustep L6 10 min. B UE/LE  (discussed weekend activities/ HEP/ sleeping issues)- no rest breaks or c/o pain.     (7.5# AWs) Seated LAQ/ marching, 25x  (good technique/ minimal fatigue reported).    Reviewed HEP  PATIENT EDUCATION:  Education details: Pt and spouse of pt educated on PT prognosis and POC Person educated: Patient and Spouse Education method: Explanation and Verbal cues Education comprehension: verbalized understanding  HOME EXERCISE PROGRAM: Access Code: [email or web address] URL: [email or web address] Date: 03/29/2024 Prepared by: Ozell Sero Exercises - Standing Hip Extension with Ankle Weight - 1 x daily - 3-5 x weekly - 2 sets - 15 reps - Standing Hip Abduction AROM - 1 x daily - 3-5 x weekly - 2 sets - 15 reps - Sit to Stand Without Arm Support - 1 x daily - 3-5 x weekly - 2 sets - 10 reps - Seated Long Arc Quad - 1 x daily - 3-5 x weekly - 2 sets - 10 reps - Heel Raises with Counter Support - 1 x daily - 3-5 x weekly - 2 sets - 15 reps    Access Code: [email or web address] URL: [email or web address] Date: 04/17/2024 Prepared by: Ozell Sero  Exercises - Standing Marching  - 1 x daily - 3-5 x weekly - 2 sets - 15 reps - Standing Hip Extension with Ankle Weight  - 1 x daily - 3-5 x weekly - 2 sets - 15 reps - Standing Hip Abduction AROM  - 1 x daily - 3-5 x weekly - 2 sets - 15 reps - Heel Raises with Counter Support  - 1 x daily - 3-5 x weekly - 2 sets - 15 reps - Sit to Stand Without Arm Support  - 1 x daily - 3-5 x weekly - 2 sets - 10 reps - Seated Long Arc Quad  -  1 x daily - 3-5 x weekly - 2 sets - 10 reps  ASSESSMENT:  CLINICAL IMPRESSION: Pt progressing well with LE strengthening with no c/o pain.  Pt. works hard with LE resisted ex. And walking with a consistent, more normalized gait pattern.  Pt. Had no  LOB and did well with higher level dynamic tasks.  Pt. Continues to progress towards all PT goals and PT will reassess FGA next tx.  Probable discharge next tx. Session if all goals met.  OBJECTIVE IMPAIRMENTS: Abnormal gait, decreased balance, decreased endurance, decreased knowledge of condition, decreased safety awareness, and postural dysfunction.   ACTIVITY LIMITATIONS: carrying, lifting, standing, squatting, sleeping, stairs, transfers, and locomotion level  PARTICIPATION LIMITATIONS: community activity and occupation  PERSONAL FACTORS: Fitness and 3+ comorbidities: anxiety/depression, Parkinson's disease, HTN, Diabetes, hx of CA, and peripheral neuropathy are also affecting patient's functional outcome.   REHAB POTENTIAL: Good  CLINICAL DECISION MAKING: Evolving/moderate complexity  EVALUATION COMPLEXITY: Moderate   GOALS: Goals reviewed with patient? Yes  SHORT TERM GOALS: Target date: 04/17/2024 Pt to increase 5xSTS score to at least 11 seconds without the use of bilateral upper extremity support a to improve independence with functional transfers and decrease fall risk.   Baseline: 13.09 seconds.  12/3: 5xSTS: 9.93 second/ 8.08 seconds Goal status: Goal met  2.  Pt will increase BERG score by 3 points to demonstrate improvement in static/dynamic balance so that patient will experience an improvement in daily household tasks and transfers with greater ease.   Baseline: 53/56  12/15: 55/56 (max potential) Goal status: Goal met   LONG TERM GOALS: Target date: 05/08/2024  Pt will increase ABC scale score to at least a 68% so that he experiences an improvement in self-confidence with balance related activities.  Baseline: 56.88%.  12/8: 95%  Goal status: Goal met  2.  Pt will improve FGA score to at least a 23/30 so that patient will experience an improvement in dynamic balance and functional tasks with greater ease.   Baseline: 19/30  Goal status: INITIAL  3.  Pt will  improve distance by at least 70 ft without the use of an assistive device to improve ability to ambulate community distances safely.   Baseline: 1050 ft  12/15: 1,152 feet (no c/o fatigue today).  Goal status: Goal met  PLAN:  PT FREQUENCY: 2x/week  PT DURATION: 6 weeks  PLANNED INTERVENTIONS: 97164- PT Re-evaluation, 97110-Therapeutic exercises, 97530- Therapeutic activity, 97112- Neuromuscular re-education, 97535- Self Care, [id number]- Manual therapy, Patient/Family education, Balance training, Stair training, Cryotherapy, and Moist heat  PLAN FOR NEXT SESSION: Progress static and dynamic balance tasks (focusing on tandem/narrow BOS and eyes closed activities).   Probable discharge next tx. If FGA goal met  Ozell JAYSON Sero, PT, DPT # [phone] 04/24/2024, 6:34 PM

## 2024-04-25 ENCOUNTER — Encounter: Payer: Self-pay | Admitting: Licensed Clinical Social Worker

## 2024-05-01 ENCOUNTER — Encounter: Payer: Self-pay | Admitting: Physical Therapy

## 2024-05-01 ENCOUNTER — Ambulatory Visit: Admitting: Licensed Clinical Social Worker

## 2024-05-01 ENCOUNTER — Ambulatory Visit: Admitting: Physical Therapy

## 2024-05-01 DIAGNOSIS — M6281 Muscle weakness (generalized): Secondary | ICD-10-CM

## 2024-05-01 DIAGNOSIS — R2689 Other abnormalities of gait and mobility: Secondary | ICD-10-CM

## 2024-05-01 DIAGNOSIS — G20C Parkinsonism, unspecified: Secondary | ICD-10-CM

## 2024-05-01 NOTE — Therapy (Signed)
 " OUTPATIENT PHYSICAL THERAPY LOWER EXTREMITY TREATMENT  Patient Name: Ricky [name]. MRN: [id number] DOB:1955-12-16, 68 y.o., male Today's Date: 05/01/2024  END OF SESSION:  PT End of Session - 05/01/24 0951     Visit Number 9    Number of Visits 12    Date for Recertification  05/08/24    PT Start Time 0944    PT Stop Time 1030    PT Time Calculation (min) 46 min    Activity Tolerance Patient tolerated treatment well;No increased pain;Patient limited by fatigue    Behavior During Therapy [facility] for tasks assessed/performed         Past Medical History:  Diagnosis Date   Anemia    Anxiety    Arthritis    Carpal tunnel syndrome    Depression    Diabetes (HCC)    Hyperlipidemia    Hypertension    Loss of teeth due to extraction    Neoplasm related pain    Parkinson's disease [facility])    Past Surgical History:  Procedure Laterality Date   CARPAL TUNNEL RELEASE Right 11/28/2020   Procedure: CARPAL TUNNEL RELEASE ENDOSCOPIC;  Surgeon: Edie Norleen PARAS, MD;  Location: [facility] ORS;  Service: Orthopedics;  Laterality: Right;   CARPAL TUNNEL RELEASE Left 01/22/2021   Procedure: CARPAL TUNNEL RELEASE ENDOSCOPIC;  Surgeon: Edie Norleen PARAS, MD;  Location: [facility] ORS;  Service: Orthopedics;  Laterality: Left;   INSERTION, GASTROSTOMY TUBE, ROBOT-ASSISTED N/A 09/30/2023   Procedure: INSERTION, GASTROSTOMY TUBE, ROBOT-ASSISTED;  Surgeon: Jordis Laneta FALCON, MD;  Location: [facility] ORS;  Service: General;  Laterality: N/A;   IR IMAGING GUIDED PORT INSERTION  06/30/2023   IR REMOVAL TUN ACCESS W/ PORT W/O FL MOD SED  12/29/2023   KNEE ARTHROSCOPY Right    KNEE ARTHROSCOPY Left    MANDIBLE FRACTURE SURGERY     as teenager   Patient Active Problem List   Diagnosis Date Noted   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) 06/17/2023   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   Alcohol use disorder, moderate, in sustained remission (HCC)  05/18/2023   Somnolence 01/08/2023   Dyslipidemia 02/02/2022   B12 deficiency 11/14/2021   Overweight (BMI 25.0-29.9) 08/27/2021   Major depression 08/26/2021   Hyponatremia    Parkinson's disease (HCC)    Essential hypertension    Type 2 diabetes mellitus with hyperlipidemia (HCC)    Carpal tunnel syndrome, left 11/08/2020   Chronic bilateral thoracic back pain 10/15/2020   Lumbar spine pain 10/15/2020   Thoracic radiculitis 10/15/2020   Chronic pain syndrome 10/15/2020   Bilateral hand numbness 10/09/2020   Moderate episode of recurrent major depressive disorder (HCC) 03/06/2020   Spinal stenosis of thoracic region 01/18/2020   Neck pain 12/14/2019   Radiculitis of right cervical region 12/14/2019   Erectile dysfunction due to diseases classified elsewhere 05/31/2019   Hx of chronic eczema 05/31/2019   Hyperlipidemia associated with type 2 diabetes mellitus (HCC) 05/31/2019   Carpal tunnel syndrome, right 03/15/2019   Eczema 06/27/2018   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 04/23/2017   Anxiety 10/26/2012   Seborrhea 10/26/2012   Social phobia 10/26/2012   Bell's palsy 11/19/2003    PCP: Fernande Ophelia PARAS DOUGLAS, MD  REFERRING PROVIDER: Fernande Ophelia PARAS DOUGLAS, MD  REFERRING DIAG: G20.C (ICD-10-CM) - Parkinsonism, unspecified Parkinsonism type (HCC)   THERAPY DIAG:  Muscle weakness (generalized)  Balance problem  Other abnormalities of gait and mobility  Parkinsonism, unspecified Parkinsonism type (HCC)  Rationale for Evaluation and Treatment: Rehabilitation  ONSET DATE: Parkinson's Dx: ~ 4 years ago  SUBJECTIVE:   SUBJECTIVE STATEMENT: Pt presents to physical therapy with referral for Parkinsonism and concerns of general muscle wasting/weakening which has lead to difficulty with prolonged standing, community ambulation, and negotiating stairs at times. Pt was diagnosed with Parkinson's disease roughly 4 years ago. Patient also received  treatment for throat cancer which he partially attributes to feeling weaker, losing weight, and having falls around the house roughly 4 months ago. Patient ended up in the hospital in June 2025 after frequent falls and visual hallucinations where the medications he was taking where adjusted and he has not had any falls since this time. Pt reports feeling unsteady when ambulating or standing for a prolonged period of time along with UE and LE muscles feeling weak.   PERTINENT HISTORY: Patient with PMH including major depression, dysphagia, peripheral neuropathy in feet, and visual impairment (Left eye worse than Right) PAIN:  Are you having pain? No Mild right ankle pain when he wakes up in the morning which improves with movement  PRECAUTIONS: None  RED FLAGS: None   WEIGHT BEARING RESTRICTIONS: No  FALLS:  Has patient fallen in last 6 months? Yes. Number of falls Pt had frequent falls roughly 5 months ago along with hallucinations/altered mental status which resulted in hospitalization in June 2025. Pt has not had any falls since this time period but reports feeling weak.   LIVING ENVIRONMENT: Lives with: lives with their spouse Lives in: House/apartment Stairs: Yes: External: 4-5 steps; can reach both Has following equipment at home: None  OCCUPATION: Mechanic  PLOF: Independent  PATIENT GOALS: To improve independence around household and with community activity and to decrease fall risk.  NEXT MD VISIT: Neurology appointment on 03/28/2024  OBJECTIVE:  Note: Objective measures were completed at Evaluation unless otherwise noted.  PATIENT SURVEYS:  ABC Scale: 56.88%  COGNITION: Overall cognitive status: Within functional limits for tasks assessed     SENSATION: Light touch: WFL  Coordination: Rapid alternating movements of UEs: negative Heel to shin coordination test: negative  POSTURE: rounded shoulders and forward head resting tremor of bilateral UEs noted  LOWER  EXTREMITY ROM:  Active ROM Right eval Left eval  Hip flexion [facility]  Hip extension    Hip abduction    Hip adduction    Hip internal rotation    Hip external rotation    Knee flexion [facility] WFL  Knee extension [facility]  Ankle dorsiflexion [facility] WFL  Ankle plantarflexion    Ankle inversion    Ankle eversion     (Blank rows = not tested)  LOWER EXTREMITY MMT:  MMT Right eval Left eval  Hip flexion 4- 4-  Hip extension    Hip abduction 4 4  Hip adduction    Hip internal rotation 3+ 3+  Hip external rotation 3+ 3+  Knee flexion 5 5  Knee extension 4+ 4+  Ankle dorsiflexion 5 5  Ankle plantarflexion    Ankle inversion    Ankle eversion     (Blank rows = not tested)  FUNCTIONAL TESTS:  5xSTS: 13.09 seconds FGA: 19/30 (biggest challenges with narrow BOS, eyes closed, change in gait speed, and backwards ambulation) mCTSB: moderate sway noted under conditions with EC   GAIT: Distance walked: 30 ft Assistive device utilized: None Level of assistance: Complete Independence Comments: Pt ambulates with reciprocal gait pattern,  narrow BOS, decreased cadence  STAIRS: Patient ascends and descends stairs with step to gait pattern, use of bilateral HRs, and requires increased time to descend safely (decreased eccentric control).  : 1050 ft (fatigue noted upon completion)  BERG: 53/56  5xSTS: 9.93 second/ 8.08 seconds  ABC:  95% marked improvement  : 1,152 feet (no c/o fatigue today).   Berg balance:  55/56 (marked improvement)                                                                                                                              TREATMENT DATE: 05/01/2024    Subjective:  Pt reports no pain and no recent LOB or falls.  Pt. States he was somewhat active over the weekend.  Pt. Reports he slept well last night.     Therapeutic Exercise Nustep L5 10 min. B UE/LE  (discussed weekend activities/ HEP/ sleeping issues)- no rest breaks or c/o pain.  No  UE assist for last 4 minutes.     (7.5# AWs) Seated LAQ/ marching, 25x  (good technique/ minimal fatigue reported).  Standing hamstring curls/ heel raises/ hip abduction 20x each.    Reviewed HEP  Therapeutic Activity  Resisted gait: 2BTB 5x all 4-planes with no UE assist in //-bars for safety.    Walking in hallway with 6/12 hurdles and added head turns with letter identification.    Step ups/down on 12 step (forward)- no UE assist.    Walking outside on rocky terrain/ grass/ up and down curbs.     PATIENT EDUCATION: f Education details: Pt and spouse of pt educated on PT prognosis and POC Person educated: Patient and Spouse Education method: Explanation and Verbal cues Education comprehension: verbalized understanding  HOME EXERCISE PROGRAM: Access Code: [email or web address] URL: [email or web address] Date: 03/29/2024 Prepared by: Ozell Sero Exercises - Standing Hip Extension with Ankle Weight - 1 x daily - 3-5 x weekly - 2 sets - 15 reps - Standing Hip Abduction AROM - 1 x daily - 3-5 x weekly - 2 sets - 15 reps - Sit to Stand Without Arm Support - 1 x daily - 3-5 x weekly - 2 sets - 10 reps - Seated Long Arc Quad - 1 x daily - 3-5 x weekly - 2 sets - 10 reps - Heel Raises with Counter Support - 1 x daily - 3-5 x weekly - 2 sets - 15 reps    Access Code: [email or web address] URL: [email or web address] Date: 04/17/2024 Prepared by: Ozell Sero  Exercises - Standing Marching  - 1 x daily - 3-5 x weekly - 2 sets - 15 reps - Standing Hip Extension with Ankle Weight  - 1 x daily - 3-5 x weekly - 2 sets - 15 reps - Standing Hip Abduction AROM  - 1 x daily - 3-5 x weekly - 2 sets - 15 reps - Heel Raises with Counter Support  - 1 x daily - 3-5 x weekly - 2 sets -  15 reps - Sit to Stand Without Arm Support  - 1 x daily - 3-5 x weekly - 2 sets - 10 reps - Seated Long Arc Quad  - 1 x daily - 3-5 x weekly - 2 sets - 10 reps  ASSESSMENT:  CLINICAL IMPRESSION: Pt  progressing well with LE strengthening with no c/o pain.  Pt. works hard with LE resisted ex. And walking with a consistent, more normalized gait pattern.  Pt. Able to amb. Over rocky terrain with no issues and consistent step pattern.  Pt. Had no LOB and did well with higher level dynamic tasks.  Pt. Continues to progress towards all PT goals and PT will reassess FGA next tx.  Probable discharge next tx. Session if all goals met.  OBJECTIVE IMPAIRMENTS: Abnormal gait, decreased balance, decreased endurance, decreased knowledge of condition, decreased safety awareness, and postural dysfunction.   ACTIVITY LIMITATIONS: carrying, lifting, standing, squatting, sleeping, stairs, transfers, and locomotion level  PARTICIPATION LIMITATIONS: community activity and occupation  PERSONAL FACTORS: Fitness and 3+ comorbidities: anxiety/depression, Parkinson's disease, HTN, Diabetes, hx of CA, and peripheral neuropathy are also affecting patient's functional outcome.   REHAB POTENTIAL: Good  CLINICAL DECISION MAKING: Evolving/moderate complexity  EVALUATION COMPLEXITY: Moderate   GOALS: Goals reviewed with patient? Yes  SHORT TERM GOALS: Target date: 04/17/2024 Pt to increase 5xSTS score to at least 11 seconds without the use of bilateral upper extremity support a to improve independence with functional transfers and decrease fall risk.   Baseline: 13.09 seconds.  12/3: 5xSTS: 9.93 second/ 8.08 seconds Goal status: Goal met  2.  Pt will increase BERG score by 3 points to demonstrate improvement in static/dynamic balance so that patient will experience an improvement in daily household tasks and transfers with greater ease.   Baseline: 53/56  12/15: 55/56 (max potential) Goal status: Goal met   LONG TERM GOALS: Target date: 05/08/2024  Pt will increase ABC scale score to at least a 68% so that he experiences an improvement in self-confidence with balance related activities.  Baseline: 56.88%.   12/8: 95%  Goal status: Goal met  2.  Pt will improve FGA score to at least a 23/30 so that patient will experience an improvement in dynamic balance and functional tasks with greater ease.   Baseline: 19/30  Goal status: INITIAL  3.  Pt will improve distance by at least 70 ft without the use of an assistive device to improve ability to ambulate community distances safely.   Baseline: 1050 ft  12/15: 1,152 feet (no c/o fatigue today).  Goal status: Goal met  PLAN:  PT FREQUENCY: 2x/week  PT DURATION: 6 weeks  PLANNED INTERVENTIONS: 97164- PT Re-evaluation, 97110-Therapeutic exercises, 97530- Therapeutic activity, 97112- Neuromuscular re-education, 97535- Self Care, [id number]- Manual therapy, Patient/Family education, Balance training, Stair training, Cryotherapy, and Moist heat  PLAN FOR NEXT SESSION: Progress static and dynamic balance tasks (focusing on tandem/narrow BOS and eyes closed activities).   10th visit progress note next tx./ Check FGA/ Discuss discharge  Ozell JAYSON Sero, PT, DPT # [phone] 05/01/2024, 7:40 PM  "

## 2024-05-02 ENCOUNTER — Encounter: Payer: Self-pay | Admitting: *Deleted

## 2024-05-03 ENCOUNTER — Ambulatory Visit: Admitting: Physical Therapy

## 2024-05-03 ENCOUNTER — Encounter: Payer: Self-pay | Admitting: Physical Therapy

## 2024-05-03 DIAGNOSIS — G20C Parkinsonism, unspecified: Secondary | ICD-10-CM

## 2024-05-03 DIAGNOSIS — R2689 Other abnormalities of gait and mobility: Secondary | ICD-10-CM

## 2024-05-03 DIAGNOSIS — M6281 Muscle weakness (generalized): Secondary | ICD-10-CM

## 2024-05-03 NOTE — Therapy (Signed)
 " OUTPATIENT PHYSICAL THERAPY LOWER EXTREMITY TREATMENT Physical Therapy Progress Note  Dates of reporting period  03/27/24   to   05/03/24   Patient Name: Ricky [name]. MRN: [id number] DOB:[birth year], 68 y.o., male Today's Date: 05/03/2024  END OF SESSION:  PT End of Session - 05/03/24 0936     Visit Number 10    Number of Visits 12    Date for Recertification  05/08/24    PT Start Time 0938    PT Stop Time 1027    PT Time Calculation (min) 49 min    Activity Tolerance Patient tolerated treatment well;No increased pain;Patient limited by fatigue    Behavior During Therapy [facility] for tasks assessed/performed         Past Medical History:  Diagnosis Date   Anemia    Anxiety    Arthritis    Carpal tunnel syndrome    Depression    Diabetes (HCC)    Hyperlipidemia    Hypertension    Loss of teeth due to extraction    Neoplasm related pain    Parkinson's disease [facility])    Past Surgical History:  Procedure Laterality Date   CARPAL TUNNEL RELEASE Right 11/28/2020   Procedure: CARPAL TUNNEL RELEASE ENDOSCOPIC;  Surgeon: Edie Norleen PARAS, MD;  Location: [facility] ORS;  Service: Orthopedics;  Laterality: Right;   CARPAL TUNNEL RELEASE Left 01/22/2021   Procedure: CARPAL TUNNEL RELEASE ENDOSCOPIC;  Surgeon: Edie Norleen PARAS, MD;  Location: [facility] OR[facility];  Service: Orthopedics;  Laterality: Left;   INSERTION, GASTROSTOMY TUBE, ROBOT-ASSISTED N/A 09/30/2023   Procedure: INSERTION, GASTROSTOMY TUBE, ROBOT-ASSISTED;  Surgeon: Jordis Laneta FALCON, MD;  Location: [facility] ORS;  Service: General;  Laterality: N/A;   IR IMAGING GUIDED PORT INSERTION  06/30/2023   IR REMOVAL TUN ACCESS W/ PORT W/O FL MOD SED  12/29/2023   KNEE ARTHROSCOPY Right    KNEE ARTHROSCOPY Left    MANDIBLE FRACTURE SURGERY     as teenager   Patient Active Problem List   Diagnosis Date Noted   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) 06/17/2023   Tonsillar  cancer (HCC) 06/17/2023   Alcohol use disorder, moderate, in sustained remission (HCC) 05/18/2023   Somnolence 01/08/2023   Dyslipidemia 02/02/2022   B12 deficiency 11/14/2021   Overweight (BMI 25.0-29.9) 08/27/2021   Major depression 08/26/2021   Hyponatremia    Parkinson's disease (HCC)    Essential hypertension    Type 2 diabetes mellitus with hyperlipidemia (HCC)    Carpal tunnel syndrome, left 11/08/2020   Chronic bilateral thoracic back pain 10/15/2020   Lumbar spine pain 10/15/2020   Thoracic radiculitis 10/15/2020   Chronic pain syndrome 10/15/2020   Bilateral hand numbness 10/09/2020   Moderate episode of recurrent major depressive disorder (HCC) 03/06/2020   Spinal stenosis of thoracic region 01/18/2020   Neck pain 12/14/2019   Radiculitis of right cervical region 12/14/2019   Erectile dysfunction due to diseases classified elsewhere 05/31/2019   Hx of chronic eczema 05/31/2019   Hyperlipidemia associated with type 2 diabetes mellitus (HCC) 05/31/2019   Carpal tunnel syndrome, right 03/15/2019   Eczema 06/27/2018   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 04/23/2017   Anxiety 10/26/2012   Seborrhea 10/26/2012   Social phobia 10/26/2012   Bell's palsy 11/19/2003    PCP: Fernande Ophelia PARAS DOUGLAS, MD  REFERRING PROVIDER: Fernande Ophelia PARAS DOUGLAS, MD  REFERRING DIAG: G20.C (  ICD-10-CM) - Parkinsonism, unspecified Parkinsonism type (HCC)   THERAPY DIAG:  Muscle weakness (generalized)  Balance problem  Other abnormalities of gait and mobility  Parkinsonism, unspecified Parkinsonism type (HCC)  Rationale for Evaluation and Treatment: Rehabilitation  ONSET DATE: Parkinson's Dx: ~ 4 years ago  SUBJECTIVE:   SUBJECTIVE STATEMENT: Pt presents to physical therapy with referral for Parkinsonism and concerns of general muscle wasting/weakening which has lead to difficulty with prolonged standing, community ambulation, and negotiating stairs at  times. Pt was diagnosed with Parkinson's disease roughly 4 years ago. Patient also received treatment for throat cancer which he partially attributes to feeling weaker, losing weight, and having falls around the house roughly 4 months ago. Patient ended up in the hospital in June 2025 after frequent falls and visual hallucinations where the medications he was taking where adjusted and he has not had any falls since this time. Pt reports feeling unsteady when ambulating or standing for a prolonged period of time along with UE and LE muscles feeling weak.   PERTINENT HISTORY: Patient with PMH including major depression, dysphagia, peripheral neuropathy in feet, and visual impairment (Left eye worse than Right) PAIN:  Are you having pain? No Mild right ankle pain when he wakes up in the morning which improves with movement  PRECAUTIONS: None  RED FLAGS: None   WEIGHT BEARING RESTRICTIONS: No  FALLS:  Has patient fallen in last 6 months? Yes. Number of falls Pt had frequent falls roughly 5 months ago along with hallucinations/altered mental status which resulted in hospitalization in June 2025. Pt has not had any falls since this time period but reports feeling weak.   LIVING ENVIRONMENT: Lives with: lives with their spouse Lives in: House/apartment Stairs: Yes: External: 4-5 steps; can reach both Has following equipment at home: None  OCCUPATION: Mechanic  PLOF: Independent  PATIENT GOALS: To improve independence around household and with community activity and to decrease fall risk.  NEXT MD VISIT: Neurology appointment on 03/28/2024  OBJECTIVE:  Note: Objective measures were completed at Evaluation unless otherwise noted.  PATIENT SURVEYS:  ABC Scale: 56.88%  COGNITION: Overall cognitive status: Within functional limits for tasks assessed     SENSATION: Light touch: WFL  Coordination: Rapid alternating movements of UEs: negative Heel to shin coordination test:  negative  POSTURE: rounded shoulders and forward head resting tremor of bilateral UEs noted  LOWER EXTREMITY ROM:  Active ROM Right eval Left eval  Hip flexion [facility]  Hip extension    Hip abduction    Hip adduction    Hip internal rotation    Hip external rotation    Knee flexion [facility] WFL  Knee extension [facility]  Ankle dorsiflexion [facility] WFL  Ankle plantarflexion    Ankle inversion    Ankle eversion     (Blank rows = not tested)  LOWER EXTREMITY MMT:  MMT Right eval Left eval  Hip flexion 4- 4-  Hip extension    Hip abduction 4 4  Hip adduction    Hip internal rotation 3+ 3+  Hip external rotation 3+ 3+  Knee flexion 5 5  Knee extension 4+ 4+  Ankle dorsiflexion 5 5  Ankle plantarflexion    Ankle inversion    Ankle eversion     (Blank rows = not tested)  FUNCTIONAL TESTS:  5xSTS: 13.09 seconds FGA: 19/30 (biggest challenges with narrow BOS, eyes closed, change in gait speed, and backwards ambulation) mCTSB: moderate sway noted under conditions with EC   GAIT: Distance  walked: 30 ft Assistive device utilized: None Level of assistance: Complete Independence Comments: Pt ambulates with reciprocal gait pattern, narrow BOS, decreased cadence  STAIRS: Patient ascends and descends stairs with step to gait pattern, use of bilateral HRs, and requires increased time to descend safely (decreased eccentric control).  : 1050 ft (fatigue noted upon completion)  BERG: 53/56  5xSTS: 9.93 second/ 8.08 seconds  ABC:  95% marked improvement  : 1,152 feet (no c/o fatigue today).   Berg balance:  55/56 (marked improvement)                                                                                                                              TREATMENT DATE: 05/03/2024    Subjective:  Pt reports no pain and no recent LOB or falls.  Pt. States he has been doing better overall since starting PT/ exercising.  Pt. Was at hospital for wife yesterday and sat a lot.        Therapeutic Exercise Nustep L6 10 min. B UE/LE  (discussed weekend activities/ HEP/ sleeping issues)- no rest breaks or c/o pain.    (7.5# AWs) Seated LAQ/ marching, 25x  (good technique/ minimal fatigue reported).  Standing hamstring curls/ heel raises/ hip abduction 20x each.  Mirror feedback for posture correction  Reviewed HEP  Therapeutic Activity  Walking in hallway with 12 hurdles/ 6 steps focusing on step overs.  Walking with alt. UE/LE touches in varying cadences.   Walking in hallway with 7.5# AW while stepping over hurdles/ 6 step.    Step touches at 2nd and 3rd step (forward with 7.5# AW)- no UE assist. 10x each with no LOB.    TG squats 30x (bilateral) and 12x (unilateral)- fatigue.      Walking outside on rocky terrain/ grass/ up and down curbs.     PATIENT EDUCATION:  Education details: Pt and spouse of pt educated on PT prognosis and POC Person educated: Patient and Spouse Education method: Explanation and Verbal cues Education comprehension: verbalized understanding  HOME EXERCISE PROGRAM: Access Code: [email or web address] URL: [email or web address] Date: 03/29/2024 Prepared by: Ozell Sero Exercises - Standing Hip Extension with Ankle Weight - 1 x daily - 3-5 x weekly - 2 sets - 15 reps - Standing Hip Abduction AROM - 1 x daily - 3-5 x weekly - 2 sets - 15 reps - Sit to Stand Without Arm Support - 1 x daily - 3-5 x weekly - 2 sets - 10 reps - Seated Long Arc Quad - 1 x daily - 3-5 x weekly - 2 sets - 10 reps - Heel Raises with Counter Support - 1 x daily - 3-5 x weekly - 2 sets - 15 reps    Access Code: [email or web address] URL: [email or web address] Date: 04/17/2024 Prepared by: Ozell Sero  Exercises - Standing Marching  - 1 x daily - 3-5 x weekly - 2 sets - 15 reps - Standing Hip Extension with Ankle Weight  - 1  x daily - 3-5 x weekly - 2 sets - 15 reps - Standing Hip Abduction AROM  - 1 x daily - 3-5 x weekly - 2 sets - 15 reps - Heel Raises  with Counter Support  - 1 x daily - 3-5 x weekly - 2 sets - 15 reps - Sit to Stand Without Arm Support  - 1 x daily - 3-5 x weekly - 2 sets - 10 reps - Seated Long Arc Quad  - 1 x daily - 3-5 x weekly - 2 sets - 10 reps  ASSESSMENT:  CLINICAL IMPRESSION: Pt progressing well with LE strengthening with no c/o pain.  Pt. works hard with LE resisted ex. And walking with a consistent, more normalized gait pattern.  Pt. Able to amb. Over rocky terrain with no issues and consistent step pattern.  Pt. Had no LOB and did well with higher level dynamic tasks.  Pt. Continues to progress towards all PT goals and PT will reassess FGA next tx.  Probable discharge next tx. Session if all goals met.  OBJECTIVE IMPAIRMENTS: Abnormal gait, decreased balance, decreased endurance, decreased knowledge of condition, decreased safety awareness, and postural dysfunction.   ACTIVITY LIMITATIONS: carrying, lifting, standing, squatting, sleeping, stairs, transfers, and locomotion level  PARTICIPATION LIMITATIONS: community activity and occupation  PERSONAL FACTORS: Fitness and 3+ comorbidities: anxiety/depression, Parkinson's disease, HTN, Diabetes, hx of CA, and peripheral neuropathy are also affecting patient's functional outcome.   REHAB POTENTIAL: Good  CLINICAL DECISION MAKING: Evolving/moderate complexity  EVALUATION COMPLEXITY: Moderate   GOALS: Goals reviewed with patient? Yes  SHORT TERM GOALS: Target date: 04/17/2024 Pt to increase 5xSTS score to at least 11 seconds without the use of bilateral upper extremity support a to improve independence with functional transfers and decrease fall risk.   Baseline: 13.09 seconds.  12/3: 5xSTS: 9.93 second/ 8.08 seconds Goal status: Goal met  2.  Pt will increase BERG score by 3 points to demonstrate improvement in static/dynamic balance so that patient will experience an improvement in daily household tasks and transfers with greater ease.   Baseline: 53/56   12/15: 55/56 (max potential) Goal status: Goal met   LONG TERM GOALS: Target date: 05/08/2024  Pt will increase ABC scale score to at least a 68% so that he experiences an improvement in self-confidence with balance related activities.  Baseline: 56.88%.  12/8: 95%  Goal status: Goal met  2.  Pt will improve FGA score to at least a 23/30 so that patient will experience an improvement in dynamic balance and functional tasks with greater ease.   Baseline: 19/30  Goal status: INITIAL  3.  Pt will improve distance by at least 70 ft without the use of an assistive device to improve ability to ambulate community distances safely.   Baseline: 1050 ft  12/15: 1,152 feet (no c/o fatigue today).  Goal status: Goal met  PLAN:  PT FREQUENCY: 2x/week  PT DURATION: 6 weeks  PLANNED INTERVENTIONS: 97164- PT Re-evaluation, 97110-Therapeutic exercises, 97530- Therapeutic activity, 97112- Neuromuscular re-education, 97535- Self Care, [id number]- Manual therapy, Patient/Family education, Balance training, Stair training, Cryotherapy, and Moist heat  PLAN FOR NEXT SESSION: Progress static and dynamic balance tasks (focusing on tandem/narrow BOS and eyes closed activities).   10th visit progress note next tx./ Check FGA/ Discuss discharge  Ozell JAYSON Sero, PT, DPT # [phone] 05/03/2024, 10:42 AM  "

## 2024-05-08 ENCOUNTER — Ambulatory Visit: Admitting: Physical Therapy

## 2024-05-08 ENCOUNTER — Encounter: Payer: Self-pay | Admitting: Physical Therapy

## 2024-05-08 DIAGNOSIS — M6281 Muscle weakness (generalized): Secondary | ICD-10-CM

## 2024-05-08 DIAGNOSIS — G20C Parkinsonism, unspecified: Secondary | ICD-10-CM

## 2024-05-08 DIAGNOSIS — R2689 Other abnormalities of gait and mobility: Secondary | ICD-10-CM

## 2024-05-08 NOTE — Therapy (Signed)
 " OUTPATIENT PHYSICAL THERAPY LOWER EXTREMITY TREATMENT  Patient Name: Ricky [name]. MRN: [id number] DOB:1956/01/09, 68 y.o., male Today's Date: 05/08/2024  END OF SESSION:  PT End of Session - 05/08/24 0948     Visit Number 11    Number of Visits 12    Date for Recertification  05/08/24    PT Start Time 0943    PT Stop Time 1027    PT Time Calculation (min) 44 min    Activity Tolerance Patient tolerated treatment well;No increased pain;Patient limited by fatigue    Behavior During Therapy [facility] for tasks assessed/performed         Past Medical History:  Diagnosis Date   Anemia    Anxiety    Arthritis    Carpal tunnel syndrome    Depression    Diabetes (HCC)    Hyperlipidemia    Hypertension    Loss of teeth due to extraction    Neoplasm related pain    Parkinson's disease [facility])    Past Surgical History:  Procedure Laterality Date   CARPAL TUNNEL RELEASE Right 11/28/2020   Procedure: CARPAL TUNNEL RELEASE ENDOSCOPIC;  Surgeon: Edie Norleen PARAS, MD;  Location: [facility] ORS;  Service: Orthopedics;  Laterality: Right;   CARPAL TUNNEL RELEASE Left 01/22/2021   Procedure: CARPAL TUNNEL RELEASE ENDOSCOPIC;  Surgeon: Edie Norleen PARAS, MD;  Location: [facility] ORS;  Service: Orthopedics;  Laterality: Left;   INSERTION, GASTROSTOMY TUBE, ROBOT-ASSISTED N/A 09/30/2023   Procedure: INSERTION, GASTROSTOMY TUBE, ROBOT-ASSISTED;  Surgeon: Jordis Laneta FALCON, MD;  Location: [facility] ORS;  Service: General;  Laterality: N/A;   IR IMAGING GUIDED PORT INSERTION  06/30/2023   IR REMOVAL TUN ACCESS W/ PORT W/O FL MOD SED  12/29/2023   KNEE ARTHROSCOPY Right    KNEE ARTHROSCOPY Left    MANDIBLE FRACTURE SURGERY     as teenager   Patient Active Problem List   Diagnosis Date Noted   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) 06/17/2023   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   Alcohol use disorder, moderate, in sustained remission (HCC)  05/18/2023   Somnolence 01/08/2023   Dyslipidemia 02/02/2022   B12 deficiency 11/14/2021   Overweight (BMI 25.0-29.9) 08/27/2021   Major depression 08/26/2021   Hyponatremia    Parkinson's disease (HCC)    Essential hypertension    Type 2 diabetes mellitus with hyperlipidemia (HCC)    Carpal tunnel syndrome, left 11/08/2020   Chronic bilateral thoracic back pain 10/15/2020   Lumbar spine pain 10/15/2020   Thoracic radiculitis 10/15/2020   Chronic pain syndrome 10/15/2020   Bilateral hand numbness 10/09/2020   Moderate episode of recurrent major depressive disorder (HCC) 03/06/2020   Spinal stenosis of thoracic region 01/18/2020   Neck pain 12/14/2019   Radiculitis of right cervical region 12/14/2019   Erectile dysfunction due to diseases classified elsewhere 05/31/2019   Hx of chronic eczema 05/31/2019   Hyperlipidemia associated with type 2 diabetes mellitus (HCC) 05/31/2019   Carpal tunnel syndrome, right 03/15/2019   Eczema 06/27/2018   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 04/23/2017   Anxiety 10/26/2012   Seborrhea 10/26/2012   Social phobia 10/26/2012   Bell's palsy 11/19/2003    PCP: Fernande Ophelia PARAS DOUGLAS, MD  REFERRING PROVIDER: Fernande Ophelia PARAS DOUGLAS, MD  REFERRING DIAG: G20.C (ICD-10-CM) - Parkinsonism, unspecified Parkinsonism type (HCC)   THERAPY DIAG:  Muscle weakness (generalized)  Balance problem  Other abnormalities of gait and mobility  Parkinsonism, unspecified Parkinsonism type (HCC)  Rationale for Evaluation and Treatment: Rehabilitation  ONSET DATE: Parkinson's Dx: ~ 4 years ago  SUBJECTIVE:   SUBJECTIVE STATEMENT: Pt presents to physical therapy with referral for Parkinsonism and concerns of general muscle wasting/weakening which has lead to difficulty with prolonged standing, community ambulation, and negotiating stairs at times. Pt was diagnosed with Parkinson's disease roughly 4 years ago. Patient also received  treatment for throat cancer which he partially attributes to feeling weaker, losing weight, and having falls around the house roughly 4 months ago. Patient ended up in the hospital in June 2025 after frequent falls and visual hallucinations where the medications he was taking where adjusted and he has not had any falls since this time. Pt reports feeling unsteady when ambulating or standing for a prolonged period of time along with UE and LE muscles feeling weak.   PERTINENT HISTORY: Patient with PMH including major depression, dysphagia, peripheral neuropathy in feet, and visual impairment (Left eye worse than Right) PAIN:  Are you having pain? No Mild right ankle pain when he wakes up in the morning which improves with movement  PRECAUTIONS: None  RED FLAGS: None   WEIGHT BEARING RESTRICTIONS: No  FALLS:  Has patient fallen in last 6 months? Yes. Number of falls Pt had frequent falls roughly 5 months ago along with hallucinations/altered mental status which resulted in hospitalization in June 2025. Pt has not had any falls since this time period but reports feeling weak.   LIVING ENVIRONMENT: Lives with: lives with their spouse Lives in: House/apartment Stairs: Yes: External: 4-5 steps; can reach both Has following equipment at home: None  OCCUPATION: Mechanic  PLOF: Independent  PATIENT GOALS: To improve independence around household and with community activity and to decrease fall risk.  NEXT MD VISIT: Neurology appointment on 03/28/2024  OBJECTIVE:  Note: Objective measures were completed at Evaluation unless otherwise noted.  PATIENT SURVEYS:  ABC Scale: 56.88%  COGNITION: Overall cognitive status: Within functional limits for tasks assessed     SENSATION: Light touch: WFL  Coordination: Rapid alternating movements of UEs: negative Heel to shin coordination test: negative  POSTURE: rounded shoulders and forward head resting tremor of bilateral UEs noted  LOWER  EXTREMITY ROM:  Active ROM Right eval Left eval  Hip flexion [facility]  Hip extension    Hip abduction    Hip adduction    Hip internal rotation    Hip external rotation    Knee flexion [facility] WFL  Knee extension [facility]  Ankle dorsiflexion [facility] WFL  Ankle plantarflexion    Ankle inversion    Ankle eversion     (Blank rows = not tested)  LOWER EXTREMITY MMT:  MMT Right eval Left eval  Hip flexion 4- 4-  Hip extension    Hip abduction 4 4  Hip adduction    Hip internal rotation 3+ 3+  Hip external rotation 3+ 3+  Knee flexion 5 5  Knee extension 4+ 4+  Ankle dorsiflexion 5 5  Ankle plantarflexion    Ankle inversion    Ankle eversion     (Blank rows = not tested)  FUNCTIONAL TESTS:  5xSTS: 13.09 seconds FGA: 19/30 (biggest challenges with narrow BOS, eyes closed, change in gait speed, and backwards ambulation) mCTSB: moderate sway noted under conditions with EC   GAIT: Distance walked: 30 ft Assistive device utilized: None Level of assistance: Complete Independence Comments: Pt ambulates with reciprocal gait pattern,  narrow BOS, decreased cadence  STAIRS: Patient ascends and descends stairs with step to gait pattern, use of bilateral HRs, and requires increased time to descend safely (decreased eccentric control).  : 1050 ft (fatigue noted upon completion)  BERG: 53/56  5xSTS: 9.93 second/ 8.08 seconds  ABC:  95% marked improvement  : 1,152 feet (no c/o fatigue today).   Berg balance:  55/56 (marked improvement)                                                                                                                              TREATMENT DATE: 05/08/2024    Subjective:  Pt reports no new complaints.  Pt. Has f/u with ENT this afternoon to reassess swallowing.  Pt. States he has been doing better overall since starting PT/ exercising.  Pt. Has colonoscopy scheduled for next week.      Therapeutic Exercise Nustep L6 10 min. B UE/LE  (discussed  weekend activities/ HEP/ sleeping issues)- no rest breaks or c/o pain.    (10# AWs) Seated LAQ/ marching, 25x  (good technique/ minimal fatigue reported).    TM walking: 1.6 mph for 6 minutes (added 1-2% incline).  Moderate B LE fatigue requiring seated rest break.    Standing B shoulder ex.: GTB scap. Retraction/ shoulder flexion 15 x 2 on L/R.   Pt. Discussed how he feel really weak in B UE.    Therapeutic Activity  Nautilus: 155# forward/backwards walking 5x each.  No lateral walking today.  Moderate B LE muscle fatigue.    Walking in hallway with varying cadence and added walking with alt. UE/LE touches (2 laps in hallway).  Recip. Step ups/downs at stairs with light UE assist.  Good eccentric quad control.      TG squats 30x bilateral and single leg 10x2.      Sit to stands from gray chair with no UE assist:  10x with proper technique.    Walking in clinic/ outside with no LOB.     PATIENT EDUCATION:  Education details: Pt and spouse of pt educated on PT prognosis and POC Person educated: Patient and Spouse Education method: Explanation and Verbal cues Education comprehension: verbalized understanding  HOME EXERCISE PROGRAM: Access Code: [email or web address] URL: [email or web address] Date: 03/29/2024 Prepared by: Ozell Sero Exercises - Standing Hip Extension with Ankle Weight - 1 x daily - 3-5 x weekly - 2 sets - 15 reps - Standing Hip Abduction AROM - 1 x daily - 3-5 x weekly - 2 sets - 15 reps - Sit to Stand Without Arm Support - 1 x daily - 3-5 x weekly - 2 sets - 10 reps - Seated Long Arc Quad - 1 x daily - 3-5 x weekly - 2 sets - 10 reps - Heel Raises with Counter Support - 1 x daily - 3-5 x weekly - 2 sets - 15 reps    Access Code: [email or web address] URL: [email or web address] Date: 04/17/2024 Prepared by: Ozell Sero  Exercises -  Standing Marching  - 1 x daily - 3-5 x weekly - 2 sets - 15 reps - Standing Hip Extension with Ankle Weight  - 1 x daily - 3-5 x  weekly - 2 sets - 15 reps - Standing Hip Abduction AROM  - 1 x daily - 3-5 x weekly - 2 sets - 15 reps - Heel Raises with Counter Support  - 1 x daily - 3-5 x weekly - 2 sets - 15 reps - Sit to Stand Without Arm Support  - 1 x daily - 3-5 x weekly - 2 sets - 10 reps - Seated Long Arc Quad  - 1 x daily - 3-5 x weekly - 2 sets - 10 reps  ASSESSMENT:  CLINICAL IMPRESSION: Pt progressing well with LE strengthening with no c/o pain.  Pt. works hard with LE resisted ex. And walking with a consistent, more normalized gait pattern.  Pt. Ambulates with more normalized gait pattern but limited by generalized LE muscle fatigue.  Pt. Had no LOB and did well with higher level dynamic tasks.  Pt discussed UE muscle weakness/ posture reeducation.   Pt. Continues to progress towards all PT goals and PT will reassess FGA next tx.  Probable discharge next tx. Session if all goals met.  OBJECTIVE IMPAIRMENTS: Abnormal gait, decreased balance, decreased endurance, decreased knowledge of condition, decreased safety awareness, and postural dysfunction.   ACTIVITY LIMITATIONS: carrying, lifting, standing, squatting, sleeping, stairs, transfers, and locomotion level  PARTICIPATION LIMITATIONS: community activity and occupation  PERSONAL FACTORS: Fitness and 3+ comorbidities: anxiety/depression, Parkinson's disease, HTN, Diabetes, hx of CA, and peripheral neuropathy are also affecting patient's functional outcome.   REHAB POTENTIAL: Good  CLINICAL DECISION MAKING: Evolving/moderate complexity  EVALUATION COMPLEXITY: Moderate   GOALS: Goals reviewed with patient? Yes  SHORT TERM GOALS: Target date: 04/17/2024 Pt to increase 5xSTS score to at least 11 seconds without the use of bilateral upper extremity support a to improve independence with functional transfers and decrease fall risk.   Baseline: 13.09 seconds.  12/3: 5xSTS: 9.93 second/ 8.08 seconds Goal status: Goal met  2.  Pt will increase BERG score by  3 points to demonstrate improvement in static/dynamic balance so that patient will experience an improvement in daily household tasks and transfers with greater ease.   Baseline: 53/56  12/15: 55/56 (max potential) Goal status: Goal met   LONG TERM GOALS: Target date: 05/08/2024  Pt will increase ABC scale score to at least a 68% so that he experiences an improvement in self-confidence with balance related activities.  Baseline: 56.88%.  12/8: 95%  Goal status: Goal met  2.  Pt will improve FGA score to at least a 23/30 so that patient will experience an improvement in dynamic balance and functional tasks with greater ease.   Baseline: 19/30  Goal status: INITIAL  3.  Pt will improve distance by at least 70 ft without the use of an assistive device to improve ability to ambulate community distances safely.   Baseline: 1050 ft  12/15: 1,152 feet (no c/o fatigue today).  Goal status: Goal met  PLAN:  PT FREQUENCY: 2x/week  PT DURATION: 6 weeks  PLANNED INTERVENTIONS: 97164- PT Re-evaluation, 97110-Therapeutic exercises, 97530- Therapeutic activity, 97112- Neuromuscular re-education, 97535- Self Care, [id number]- Manual therapy, Patient/Family education, Balance training, Stair training, Cryotherapy, and Moist heat  PLAN FOR NEXT SESSION: Progress static and dynamic balance tasks (focusing on tandem/narrow BOS and eyes closed activities).   1 more PT tx. Session scheduled.  Check  FGA/ Discuss discharge  Ozell JAYSON Sero, PT, DPT # [phone] 05/08/2024, 10:50 AM  "

## 2024-05-09 ENCOUNTER — Other Ambulatory Visit: Payer: Self-pay

## 2024-05-09 DIAGNOSIS — R1310 Dysphagia, unspecified: Secondary | ICD-10-CM

## 2024-05-10 ENCOUNTER — Ambulatory Visit: Admitting: Physical Therapy

## 2024-05-10 DIAGNOSIS — M6281 Muscle weakness (generalized): Secondary | ICD-10-CM | POA: Diagnosis not present

## 2024-05-10 DIAGNOSIS — G20C Parkinsonism, unspecified: Secondary | ICD-10-CM

## 2024-05-10 DIAGNOSIS — R2689 Other abnormalities of gait and mobility: Secondary | ICD-10-CM

## 2024-05-10 NOTE — Therapy (Signed)
 " OUTPATIENT PHYSICAL THERAPY LOWER EXTREMITY TREATMENT/ RECERTIFICATION  Patient Name: Ricky [name]. MRN: [id number] DOB:[birth year], 68 y.o., male Today's Date: 05/10/2024  END OF SESSION:  PT End of Session - 05/10/24 0949     Visit Number 12    Number of Visits 18    Date for Recertification  06/21/24    PT Start Time 0940    PT Stop Time 1029    PT Time Calculation (min) 49 min    Activity Tolerance Patient tolerated treatment well;No increased pain;Patient limited by fatigue    Behavior During Therapy [facility] for tasks assessed/performed         Past Medical History:  Diagnosis Date   Anemia    Anxiety    Arthritis    Carpal tunnel syndrome    Depression    Diabetes (HCC)    Hyperlipidemia    Hypertension    Loss of teeth due to extraction    Neoplasm related pain    Parkinson's disease [facility])    Past Surgical History:  Procedure Laterality Date   CARPAL TUNNEL RELEASE Right 11/28/2020   Procedure: CARPAL TUNNEL RELEASE ENDOSCOPIC;  Surgeon: Edie Norleen PARAS, MD;  Location: [facility] ORS;  Service: Orthopedics;  Laterality: Right;   CARPAL TUNNEL RELEASE Left 01/22/2021   Procedure: CARPAL TUNNEL RELEASE ENDOSCOPIC;  Surgeon: Edie Norleen PARAS, MD;  Location: [facility] ORS;  Service: Orthopedics;  Laterality: Left;   INSERTION, GASTROSTOMY TUBE, ROBOT-ASSISTED N/A 09/30/2023   Procedure: INSERTION, GASTROSTOMY TUBE, ROBOT-ASSISTED;  Surgeon: Jordis Laneta FALCON, MD;  Location: [facility] ORS;  Service: General;  Laterality: N/A;   IR IMAGING GUIDED PORT INSERTION  06/30/2023   IR REMOVAL TUN ACCESS W/ PORT W/O FL MOD SED  12/29/2023   KNEE ARTHROSCOPY Right    KNEE ARTHROSCOPY Left    MANDIBLE FRACTURE SURGERY     as teenager   Patient Active Problem List   Diagnosis Date Noted   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) 06/17/2023   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   Alcohol use disorder, moderate, in sustained  remission (HCC) 05/18/2023   Somnolence 01/08/2023   Dyslipidemia 02/02/2022   B12 deficiency 11/14/2021   Overweight (BMI 25.0-29.9) 08/27/2021   Major depression 08/26/2021   Hyponatremia    Parkinson's disease (HCC)    Essential hypertension    Type 2 diabetes mellitus with hyperlipidemia (HCC)    Carpal tunnel syndrome, left 11/08/2020   Chronic bilateral thoracic back pain 10/15/2020   Lumbar spine pain 10/15/2020   Thoracic radiculitis 10/15/2020   Chronic pain syndrome 10/15/2020   Bilateral hand numbness 10/09/2020   Moderate episode of recurrent major depressive disorder (HCC) 03/06/2020   Spinal stenosis of thoracic region 01/18/2020   Neck pain 12/14/2019   Radiculitis of right cervical region 12/14/2019   Erectile dysfunction due to diseases classified elsewhere 05/31/2019   Hx of chronic eczema 05/31/2019   Hyperlipidemia associated with type 2 diabetes mellitus (HCC) 05/31/2019   Carpal tunnel syndrome, right 03/15/2019   Eczema 06/27/2018   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 04/23/2017   Anxiety 10/26/2012   Seborrhea 10/26/2012   Social phobia 10/26/2012   Bell's palsy 11/19/2003    PCP: Fernande Ophelia PARAS DOUGLAS, MD  REFERRING PROVIDER: Fernande Ophelia PARAS DOUGLAS, MD  REFERRING DIAG: G20.C (ICD-10-CM) - Parkinsonism, unspecified Parkinsonism type (HCC)   THERAPY DIAG:  Muscle weakness (generalized)  Balance  problem  Other abnormalities of gait and mobility  Parkinsonism, unspecified Parkinsonism type (HCC)  Rationale for Evaluation and Treatment: Rehabilitation  ONSET DATE: Parkinson's Dx: ~ 4 years ago  SUBJECTIVE:   SUBJECTIVE STATEMENT: Pt presents to physical therapy with referral for Parkinsonism and concerns of general muscle wasting/weakening which has lead to difficulty with prolonged standing, community ambulation, and negotiating stairs at times. Pt was diagnosed with Parkinson's disease roughly 4 years ago.  Patient also received treatment for throat cancer which he partially attributes to feeling weaker, losing weight, and having falls around the house roughly 4 months ago. Patient ended up in the hospital in June 2025 after frequent falls and visual hallucinations where the medications he was taking where adjusted and he has not had any falls since this time. Pt reports feeling unsteady when ambulating or standing for a prolonged period of time along with UE and LE muscles feeling weak.   PERTINENT HISTORY: Patient with PMH including major depression, dysphagia, peripheral neuropathy in feet, and visual impairment (Left eye worse than Right) PAIN:  Are you having pain? No Mild right ankle pain when he wakes up in the morning which improves with movement  PRECAUTIONS: None  RED FLAGS: None   WEIGHT BEARING RESTRICTIONS: No  FALLS:  Has patient fallen in last 6 months? Yes. Number of falls Pt had frequent falls roughly 5 months ago along with hallucinations/altered mental status which resulted in hospitalization in June 2025. Pt has not had any falls since this time period but reports feeling weak.   LIVING ENVIRONMENT: Lives with: lives with their spouse Lives in: House/apartment Stairs: Yes: External: 4-5 steps; can reach both Has following equipment at home: None  OCCUPATION: Mechanic  PLOF: Independent  PATIENT GOALS: To improve independence around household and with community activity and to decrease fall risk.  NEXT MD VISIT: Neurology appointment on 03/28/2024  OBJECTIVE:  Note: Objective measures were completed at Evaluation unless otherwise noted.  PATIENT SURVEYS:  ABC Scale: 56.88%  COGNITION: Overall cognitive status: Within functional limits for tasks assessed     SENSATION: Light touch: WFL  Coordination: Rapid alternating movements of UEs: negative Heel to shin coordination test: negative  POSTURE: rounded shoulders and forward head resting tremor of  bilateral UEs noted  LOWER EXTREMITY ROM:  Active ROM Right eval Left eval  Hip flexion [facility]  Hip extension    Hip abduction    Hip adduction    Hip internal rotation    Hip external rotation    Knee flexion [facility] WFL  Knee extension [facility]  Ankle dorsiflexion [facility] WFL  Ankle plantarflexion    Ankle inversion    Ankle eversion     (Blank rows = not tested)  LOWER EXTREMITY MMT:  MMT Right eval Left eval  Hip flexion 4- 4-  Hip extension    Hip abduction 4 4  Hip adduction    Hip internal rotation 3+ 3+  Hip external rotation 3+ 3+  Knee flexion 5 5  Knee extension 4+ 4+  Ankle dorsiflexion 5 5  Ankle plantarflexion    Ankle inversion    Ankle eversion     (Blank rows = not tested)  FUNCTIONAL TESTS:  5xSTS: 13.09 seconds FGA: 19/30 (biggest challenges with narrow BOS, eyes closed, change in gait speed, and backwards ambulation) mCTSB: moderate sway noted under conditions with EC   GAIT: Distance walked: 30 ft Assistive device utilized: None Level of assistance: Complete Independence Comments: Pt ambulates with reciprocal  gait pattern, narrow BOS, decreased cadence  STAIRS: Patient ascends and descends stairs with step to gait pattern, use of bilateral HRs, and requires increased time to descend safely (decreased eccentric control).  : 1050 ft (fatigue noted upon completion)  BERG: 53/56  5xSTS: 9.93 second/ 8.08 seconds  ABC:  95% marked improvement  : 1,152 feet (no c/o fatigue today).   Berg balance:  55/56 (marked improvement)                                                                                                                              TREATMENT DATE: 05/10/2024    Subjective:  Pt. Has colonoscopy scheduled for next week.   Pt. Reports improvements since starting PT but reports continued generalized muscle weakness, esp. In B UE.  PT sent MD order for continued PT and will include B UE muscle/ posture strengthening exercises.       Therapeutic Exercise Nustep L6 10 min. B UE/LE  (discussed weekend activities/ HEP (resisted UE)/ sleeping issues)- no rest breaks or c/o pain.    Issued UE ex. Program (see handouts)- issued BTB.  Shoulder flexion/ scap. Retraction/ ER/ bicep curls  (10# AWs) Seated LAQ/ marching, 25x  (good technique/ minimal fatigue reported).  Walking in //-bars with 6 hurdle clearance (forward/ lateral).    Therapeutic Activity  TG knee flexion with 30# wt. (Midline/ toe out)- 25x each.  Heel raises 20x.    Recip. Step ups/downs at stairs with light UE assist.  Good eccentric quad control.      Walking in hallway and outside with varying cadence.  Consistent step pattern/ heel strike.    Sit to stands from gray chair with no UE assist:  10x with proper technique.     PATIENT EDUCATION:  Education details: Pt and spouse of pt educated on PT prognosis and POC Person educated: Patient and Spouse Education method: Explanation and Verbal cues Education comprehension: verbalized understanding  HOME EXERCISE PROGRAM: Access Code: [email or web address] URL: [email or web address] Date: 03/29/2024 Prepared by: Ozell Sero Exercises - Standing Hip Extension with Ankle Weight - 1 x daily - 3-5 x weekly - 2 sets - 15 reps - Standing Hip Abduction AROM - 1 x daily - 3-5 x weekly - 2 sets - 15 reps - Sit to Stand Without Arm Support - 1 x daily - 3-5 x weekly - 2 sets - 10 reps - Seated Long Arc Quad - 1 x daily - 3-5 x weekly - 2 sets - 10 reps - Heel Raises with Counter Support - 1 x daily - 3-5 x weekly - 2 sets - 15 reps    Access Code: [email or web address] URL: [email or web address] Date: 04/17/2024 Prepared by: Ozell Sero  Exercises - Standing Marching  - 1 x daily - 3-5 x weekly - 2 sets - 15 reps - Standing Hip Extension with Ankle Weight  - 1 x daily - 3-5 x weekly - 2 sets - 15 reps -  Standing Hip Abduction AROM  - 1 x daily - 3-5 x weekly - 2 sets - 15 reps - Heel Raises with  Counter Support  - 1 x daily - 3-5 x weekly - 2 sets - 15 reps - Sit to Stand Without Arm Support  - 1 x daily - 3-5 x weekly - 2 sets - 10 reps - Seated Long Arc Quad  - 1 x daily - 3-5 x weekly - 2 sets - 10 reps  ASSESSMENT:  CLINICAL IMPRESSION: Pt progressing well with LE strengthening exercises with no c/o pain.  Pt. works hard with LE resisted ex. And walking with a consistent gait pattern.  Pt. Ambulates with more normalized gait pattern but limited by generalized LE muscle fatigue.  Pt. Had no LOB and did well with higher level dynamic tasks.  Pt issued UE muscle strengthening/ posture exercises.   Pt. Continues to progress towards all PT goals.  Pt. Will continue to benefit from skilled PT services to increase B UE/LE muscle strength to improve dynamic balance/ ambulation on varying terrain.    OBJECTIVE IMPAIRMENTS: Abnormal gait, decreased balance, decreased endurance, decreased knowledge of condition, decreased safety awareness, and postural dysfunction.   ACTIVITY LIMITATIONS: carrying, lifting, standing, squatting, sleeping, stairs, transfers, and locomotion level  PARTICIPATION LIMITATIONS: community activity and occupation  PERSONAL FACTORS: Fitness and 3+ comorbidities: anxiety/depression, Parkinson's disease, HTN, Diabetes, hx of CA, and peripheral neuropathy are also affecting patient's functional outcome.   REHAB POTENTIAL: Good  CLINICAL DECISION MAKING: Evolving/moderate complexity  EVALUATION COMPLEXITY: Moderate   GOALS: Goals reviewed with patient? Yes  LONG TERM GOALS: Target date: 06/21/2024  Pt will increase ABC scale score to at least a 68% so that he experiences an improvement in self-confidence with balance related activities.  Baseline: 56.88%.  12/8: 95%  Goal status: Goal met  2.  Pt will improve FGA score to at least a 23/30 so that patient will experience an improvement in dynamic balance and functional tasks with greater ease.   Baseline: 19/30   Goal status: On-going  3.  Pt will improve distance by at least 70 ft without the use of an assistive device to improve ability to ambulate community distances safely.   Baseline: 1050 ft  12/15: 1,152 feet (no c/o fatigue today).  Goal status: Goal met  4.  Pt. Independent with HEP to increase B shoulder flexion/ abduction to 4+/5 MMT to improve carrying/ lifting tasks with no pain.    Baseline: see above  Goal status:  Initial  5.  Pt. Able to complete 45 minutes of there.ex. with no increase c/o pain or generalized muscle fatigue to improve functional mobility with daily activities/ ADL.      Baseline: see above    Goal status: Initial  PLAN:  PT FREQUENCY: 1x/week  PT DURATION: 6 weeks  PLANNED INTERVENTIONS: 97164- PT Re-evaluation, 97110-Therapeutic exercises, 97530- Therapeutic activity, 97112- Neuromuscular re-education, 97535- Self Care, 02859- Manual therapy, Patient/Family education, Balance training, Stair training, Cryotherapy, and Moist heat  PLAN FOR NEXT SESSION: Progress static and dynamic balance tasks (focusing on tandem/narrow BOS and eyes closed activities).   Review UE ex.  Ozell JAYSON Sero, PT, DPT # [phone] 05/10/2024, 2:14 PM  "

## 2024-05-15 ENCOUNTER — Encounter: Payer: Self-pay | Admitting: *Deleted

## 2024-05-15 ENCOUNTER — Encounter: Payer: Self-pay | Admitting: Oncology

## 2024-05-16 ENCOUNTER — Encounter
Admission: RE | Disposition: A | Discharge status: Home / Self Care | Payer: Self-pay | Source: Home / Self Care | Attending: Gastroenterology

## 2024-05-16 ENCOUNTER — Ambulatory Visit
Admission: RE | Admit: 2024-05-16 | Discharge: 2024-05-16 | Disposition: A | Discharge status: Home / Self Care | Attending: Gastroenterology | Admitting: Gastroenterology

## 2024-05-16 ENCOUNTER — Other Ambulatory Visit: Payer: Self-pay

## 2024-05-16 ENCOUNTER — Ambulatory Visit: Admitting: Certified Registered"

## 2024-05-16 ENCOUNTER — Encounter: Payer: Self-pay | Admitting: Oncology

## 2024-05-16 DIAGNOSIS — R634 Abnormal weight loss: Secondary | ICD-10-CM | POA: Insufficient documentation

## 2024-05-16 DIAGNOSIS — E119 Type 2 diabetes mellitus without complications: Secondary | ICD-10-CM | POA: Diagnosis not present

## 2024-05-16 DIAGNOSIS — R1310 Dysphagia, unspecified: Secondary | ICD-10-CM | POA: Diagnosis present

## 2024-05-16 DIAGNOSIS — D123 Benign neoplasm of transverse colon: Secondary | ICD-10-CM | POA: Diagnosis not present

## 2024-05-16 DIAGNOSIS — Q453 Other congenital malformations of pancreas and pancreatic duct: Secondary | ICD-10-CM | POA: Insufficient documentation

## 2024-05-16 DIAGNOSIS — D122 Benign neoplasm of ascending colon: Secondary | ICD-10-CM | POA: Diagnosis not present

## 2024-05-16 DIAGNOSIS — F419 Anxiety disorder, unspecified: Secondary | ICD-10-CM | POA: Insufficient documentation

## 2024-05-16 DIAGNOSIS — F32A Depression, unspecified: Secondary | ICD-10-CM | POA: Diagnosis not present

## 2024-05-16 DIAGNOSIS — Z6825 Body mass index (BMI) 25.0-25.9, adult: Secondary | ICD-10-CM | POA: Diagnosis not present

## 2024-05-16 DIAGNOSIS — K219 Gastro-esophageal reflux disease without esophagitis: Secondary | ICD-10-CM | POA: Insufficient documentation

## 2024-05-16 DIAGNOSIS — Z1211 Encounter for screening for malignant neoplasm of colon: Secondary | ICD-10-CM | POA: Diagnosis not present

## 2024-05-16 DIAGNOSIS — K3189 Other diseases of stomach and duodenum: Secondary | ICD-10-CM | POA: Insufficient documentation

## 2024-05-16 DIAGNOSIS — K5900 Constipation, unspecified: Secondary | ICD-10-CM | POA: Diagnosis not present

## 2024-05-16 DIAGNOSIS — K640 First degree hemorrhoids: Secondary | ICD-10-CM | POA: Diagnosis not present

## 2024-05-16 DIAGNOSIS — I10 Essential (primary) hypertension: Secondary | ICD-10-CM | POA: Insufficient documentation

## 2024-05-16 DIAGNOSIS — G20A1 Parkinson's disease without dyskinesia, without mention of fluctuations: Secondary | ICD-10-CM | POA: Diagnosis not present

## 2024-05-16 DIAGNOSIS — K2289 Other specified disease of esophagus: Secondary | ICD-10-CM | POA: Insufficient documentation

## 2024-05-16 HISTORY — PX: RECTAL BIOPSY: SHX2303

## 2024-05-16 HISTORY — PX: ESOPHAGOGASTRODUODENOSCOPY: SHX5428

## 2024-05-16 HISTORY — PX: COLONOSCOPY: SHX5424

## 2024-05-16 HISTORY — PX: POLYPECTOMY: SHX149

## 2024-05-16 LAB — GLUCOSE, CAPILLARY: Glucose-Capillary: 104 mg/dL — ABNORMAL HIGH (ref 70–99)

## 2024-05-16 SURGERY — COLONOSCOPY
Anesthesia: Monitor Anesthesia Care

## 2024-05-16 MED ORDER — PROPOFOL 10 MG/ML IV BOLUS
INTRAVENOUS | Status: DC | PRN
  Administered 2024-05-16: 100 mg via INTRAVENOUS

## 2024-05-16 MED ORDER — LIDOCAINE HCL (CARDIAC) PF 100 MG/5ML IV SOSY
PREFILLED_SYRINGE | INTRAVENOUS | Status: DC | PRN
  Administered 2024-05-16: 100 mg via INTRAVENOUS

## 2024-05-16 MED ORDER — PROPOFOL 500 MG/50ML IV EMUL
INTRAVENOUS | Status: DC | PRN
  Administered 2024-05-16: 150 ug/kg/min via INTRAVENOUS

## 2024-05-16 MED ORDER — SODIUM CHLORIDE 0.9 % IV SOLN: INTRAVENOUS | Status: DC

## 2024-05-16 NOTE — Op Note (Signed)
 [facility] Gastroenterology Patient Name: Ricky [name] Procedure Date: 05/16/2024 9:12 AM MRN: [id number] Account #: [id number] Date of Birth: 10-31-1955 Admit Type: Outpatient Age: 69 Room: [facility] ENDO ROOM 1 Gender: Male Note Status: Finalized Instrument Name: Upper GI Scope [id number] Procedure:             Upper GI endoscopy Indications:           Dysphagia, Gastro-esophageal reflux disease Providers:             Ole Schick MD, MD Referring MD:          Ophelia Sage, MD (Referring MD) Medicines:             Monitored Anesthesia Care Complications:         No immediate complications. Estimated blood loss:                         Minimal. Procedure:             Pre-Anesthesia Assessment:                        - Prior to the procedure, a History and Physical was                         performed, and patient medications and allergies were                         reviewed. The patient is competent. The risks and                         benefits of the procedure and the sedation options and                         risks were discussed with the patient. All questions                         were answered and informed consent was obtained.                         Patient identification and proposed procedure were                         verified by the physician, the nurse, the                         anesthesiologist, the anesthetist and the technician                         in the endoscopy suite. Mental Status Examination:                         alert and oriented. Airway Examination: normal                         oropharyngeal airway and neck mobility. Respiratory                         Examination: clear to auscultation. CV Examination:  normal. Prophylactic Antibiotics: The patient does not                         require prophylactic antibiotics. Prior                         Anticoagulants: The patient has taken no anticoagulant                          or antiplatelet agents. ASA Grade Assessment: III - A                         patient with severe systemic disease. After reviewing                         the risks and benefits, the patient was deemed in                         satisfactory condition to undergo the procedure. The                         anesthesia plan was to use monitored anesthesia care                         (MAC). Immediately prior to administration of                         medications, the patient was re-assessed for adequacy                         to receive sedatives. The heart rate, respiratory                         rate, oxygen saturations, blood pressure, adequacy of                         pulmonary ventilation, and response to care were                         monitored throughout the procedure. The physical                         status of the patient was re-assessed after the                         procedure.                        After obtaining informed consent, the endoscope was                         passed under direct vision. Throughout the procedure,                         the patient's blood pressure, pulse, and oxygen                         saturations were monitored continuously. The Endoscope  was introduced through the mouth, and advanced to the                         second part of duodenum. The upper GI endoscopy was                         accomplished without difficulty. The patient tolerated                         the procedure well. Findings:      No endoscopic abnormality was evident in the esophagus to explain the       patient's complaint of dysphagia. Biopsies were obtained from the       proximal and distal esophagus with cold forceps for histology of       suspected eosinophilic esophagitis. Estimated blood loss was minimal.      The Z-line was irregular.      A single umbilicated lesion measuring 18 mm in diameter was found in  the       gastric antrum. Biopsies were taken with a cold forceps for histology.       Estimated blood loss was minimal.      Localized mild mucosal variance characterized by congestion was found in       the gastric antrum. Biopsies were taken with a cold forceps for       histology. Estimated blood loss was minimal.      The examined duodenum was normal. Impression:            - No endoscopic esophageal abnormality to explain                         patient's dysphagia.                        - Z-line irregular.                        - A single lesion diagnostic of aberrant pancreas was                         found in the stomach. Biopsied.                        - Gastric mucosal variant. Biopsied.                        - Normal examined duodenum.                        - Biopsies were taken with a cold forceps for                         evaluation of eosinophilic esophagitis. Recommendation:        - Discharge patient to home.                        - Resume previous diet.                        - Continue present medications.                        -  Await pathology results.                        - Return to referring physician as previously                         scheduled. 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 All rights  reserved. The codes documented in this report are preliminary and upon coder review may  be revised to meet current compliance requirements. Ole Schick MD, MD 05/16/2024 9:59:03 AM Number of Addenda: 0 Note Initiated On: 05/16/2024 9:12 AM Estimated Blood Loss:  Estimated blood loss was minimal.      [facility]

## 2024-05-16 NOTE — Anesthesia Postprocedure Evaluation (Signed)
"   Anesthesia Post Note  Patient: Ricky [name].  Procedure(s) Performed: COLONOSCOPY EGD (ESOPHAGOGASTRODUODENOSCOPY) BIOPSY, stomach POLYPECTOMY, INTESTINE  Patient location during evaluation: PACU Anesthesia Type: MAC Level of consciousness: awake and alert Pain management: pain level controlled Vital Signs Assessment: post-procedure vital signs reviewed and stable Respiratory status: spontaneous breathing, nonlabored ventilation, respiratory function stable and patient connected to nasal cannula oxygen Cardiovascular status: blood pressure returned to baseline and stable Postop Assessment: no apparent nausea or vomiting Anesthetic complications: no   No notable events documented.   Last Vitals:  Vitals:   05/16/24 0813  BP: (!) 150/79  Pulse: 73  Resp: 20  Temp: (!) 36.2 C  SpO2: 98%    Last Pain:  Vitals:   05/16/24 0813  TempSrc: Tympanic  PainSc: 0-No pain                 Redell MARLA Breaker      "

## 2024-05-16 NOTE — H&P (Signed)
 Outpatient short stay form Pre-procedure 05/16/2024  Ricky ONEIDA Schick, MD  Primary Physician: Fernande Ophelia JINNY DOUGLAS, MD  Reason for visit:  Dysphagia/GERD/Unintentional weight loss/Constipation/Colon cancer screening  History of present illness:    69 y/o gentleman with history of HEENT cancer, parkinson's, and hypothyroidism here for EGD for solid food dysphagia and colonoscopy for weight loss/constipation/colon cancer screening. No blood thinners. No family history of GI malignancies. No significant abdominal surgeries.   Current Medications[1]  Medications Prior to Admission  Medication Sig Dispense Refill Last Dose/Taking   ACCU-CHEK GUIDE TEST test strip TEST 2X DAILY      Accu-Chek Softclix Lancets lancets 2 (two) times daily.      benzonatate  (TESSALON  PERLES) 100 MG capsule Take 2 capsules (200 mg total) by mouth 3 (three) times daily as needed for cough. 30 capsule 0    Blood Glucose Monitoring Suppl (ACCU-CHEK GUIDE ME) w/Device KIT See admin instructions.      buPROPion  (WELLBUTRIN  XL) 300 MG 24 hr tablet Take 1 tablet (300 mg total) by mouth daily. 30 tablet 5    busPIRone  (BUSPAR ) 7.5 MG tablet Take 7.5 mg by mouth 2 (two) times daily.      calcium  carbonate (CALCIUM  600) 600 MG TABS tablet Take 1 tablet (600 mg total) by mouth daily. For low calcium  60 tablet 0    calcium  carbonate (TUMS) 500 MG chewable tablet Chew 500 mg by mouth daily.      carbidopa -levodopa  (SINEMET  IR) 25-100 MG tablet Take 2-3 tablets by mouth See admin instructions. Take 3 tablets in morning 2 tablet at bedtime (Patient taking differently: Take 1 tablet by mouth in the morning and at bedtime.)      clonazePAM  (KLONOPIN ) 1 MG tablet Take 1 tablet (1 mg total) by mouth 3 (three) times daily as needed for anxiety. 90 tablet 1    cyanocobalamin  (VITAMIN B12) 1000 MCG tablet Take 1,000 mcg by mouth daily.      divalproex (DEPAKOTE) 250 MG DR tablet Take 250 mg by mouth daily.      fentaNYL  (DURAGESIC ) 12  MCG/HR Place 1 patch onto the skin every 3 (three) days. 10 patch 0    folic acid  (FOLVITE ) 1 MG tablet TAKE 1 TABLET(1 MG) BY MOUTH DAILY 30 tablet 3    gabapentin  (NEURONTIN ) 300 MG capsule Take 300 mg by mouth daily. (Patient not taking: Reported on 03/22/2024)      ketoconazole (NIZORAL) 2 % shampoo Apply 1 application  topically every other day.      levothyroxine (SYNTHROID) 50 MCG tablet Take 50 mcg by mouth.      lidocaine -prilocaine  (EMLA ) cream Apply to affected area once 30 g 3    magnesium  chloride (SLOW-MAG) 64 MG TBEC SR tablet Take 2 tablets (128 mg total) by mouth 2 (two) times daily. For low magnesium  (Patient not taking: Reported on 03/22/2024) 120 tablet 00    metFORMIN  (GLUCOPHAGE ) 500 MG tablet Take 500 mg by mouth daily with breakfast.      Multiple Vitamin (MULTI-VITAMIN) tablet Take 1 tablet by mouth daily.      Nutritional Supplements (NUTREN 1.5) LIQD Give 1 carton in feeding tube via bolus syringe 5 times a day.  Flush with 60cc of water  before and 120cc of water  after.      ondansetron  (ZOFRAN ) 8 MG tablet Take 1 tablet (8 mg total) by mouth every 8 (eight) hours as needed for nausea or vomiting. Start on the third day after cisplatin . 30 tablet 1  Oxycodone  HCl 10 MG TABS Take 1 tablet (10 mg total) by mouth every 4 (four) hours as needed. 60 tablet 0    pantoprazole  (PROTONIX ) 40 MG tablet TAKE 1 TABLET(40 MG) BY MOUTH DAILY 90 tablet 2    polyethylene glycol (MIRALAX / GLYCOLAX) 17 g packet Take 17 g by mouth daily.      prochlorperazine  (COMPAZINE ) 10 MG tablet Take 1 tablet (10 mg total) by mouth every 6 (six) hours as needed for nausea or vomiting. 30 tablet 1    ramelteon  (ROZEREM ) 8 MG tablet Take 1 tablet (8 mg total) by mouth at bedtime. 30 tablet 1    rosuvastatin  (CRESTOR ) 20 MG tablet Take 20 mg by mouth daily.      senna (SENOKOT) 8.6 MG tablet Take 1 tablet by mouth 2 (two) times daily.      sertraline  (ZOLOFT ) 100 MG tablet Take 1.5 tablets (150 mg  total) by mouth at bedtime. 45 tablet 3    sucralfate  (CARAFATE ) 1 g tablet Take 1 tablet (1 g total) by mouth 3 (three) times daily before meals. Dissolve in 4 tbs of warm water , swish and swallow 90 tablet 1    tadalafil (CIALIS) 5 MG tablet Take 5 mg by mouth daily.      tamsulosin (FLOMAX) 0.4 MG CAPS capsule Take 0.4 mg by mouth daily.      traZODone  (DESYREL ) 50 MG tablet Take 0.5-2 tablets (25-100 mg total) by mouth at bedtime as needed for sleep. (Patient not taking: Reported on 04/03/2024) 60 tablet 3      Allergies[2]   Past Medical History:  Diagnosis Date   Anemia    Anxiety    Arthritis    Carpal tunnel syndrome    Depression    Diabetes (HCC)    Hyperlipidemia    Hypertension    Loss of teeth due to extraction    Neoplasm related pain    Parkinson's disease (HCC)     Review of systems:  Otherwise negative.    Physical Exam  Gen: Alert, oriented. Appears stated age.  HEENT: PERRLA. Lungs: No respiratory distress CV: RRR Abd: soft, benign, no masses Ext: No edema    Planned procedures: Proceed with EGD/colonoscopy. The patient understands the nature of the planned procedure, indications, risks, alternatives and potential complications including but not limited to bleeding, infection, perforation, damage to internal organs and possible oversedation/side effects from anesthesia. The patient agrees and gives consent to proceed.  Please refer to procedure notes for findings, recommendations and patient disposition/instructions.     Ricky ONEIDA Schick, MD [facility]         [1]  Current Facility-Administered Medications:    0.9 %  sodium chloride  infusion, , Intravenous, Continuous, Alicja Everitt, Ricky ONEIDA, MD, Last Rate: 20 mL/hr at 05/16/24 0815, New Bag at 05/16/24 0815 [2]  Allergies Allergen Reactions   Mirtazapine Rash   Other Itching and Rash    ETCO2 tubing. Medtronic Microstream advance

## 2024-05-16 NOTE — Anesthesia Preprocedure Evaluation (Signed)
"                                    Anesthesia Evaluation  Patient identified by MRN, date of birth, ID band Patient awake    Reviewed: NPO status , reviewed documented beta blocker date and time   Airway Mallampati: II  TM Distance: >3 FB Neck ROM: Full    Dental no notable dental hx. (+) Edentulous Upper, Edentulous Lower   Pulmonary    Pulmonary exam normal breath sounds clear to auscultation       Cardiovascular Exercise Tolerance: Good hypertension, Normal cardiovascular exam Rhythm:Regular Rate:Normal     Neuro/Psych  PSYCHIATRIC DISORDERS Anxiety Depression     Neuromuscular disease    GI/Hepatic   Endo/Other  diabetes    Renal/GU      Musculoskeletal  (+) Arthritis ,    Abdominal   Peds  Hematology  (+) Blood dyscrasia, anemia   Anesthesia Other Findings   Reproductive/Obstetrics                              Anesthesia Physical Anesthesia Plan  ASA: 3  Anesthesia Plan: MAC and General   Post-op Pain Management: Minimal or no pain anticipated   Induction: Intravenous  PONV Risk Score and Plan: 0 and 1  Airway Management Planned: Mask  Additional Equipment:   Intra-op Plan:   Post-operative Plan: Extubation in OR  Informed Consent: I have reviewed the patients History and Physical, chart, labs and discussed the procedure including the risks, benefits and alternatives for the proposed anesthesia with the patient or authorized representative who has indicated his/her understanding and acceptance.       Plan Discussed with: CRNA  Anesthesia Plan Comments:         Anesthesia Quick Evaluation  "

## 2024-05-16 NOTE — Interval H&P Note (Signed)
 History and Physical Interval Note:  05/16/2024 9:20 AM  Ricky [name] Ricky [name].  has presented today for surgery, with the diagnosis of Dysphagia, unspecified type (R13.10) Colon cancer screening (Z12.11).  The various methods of treatment have been discussed with the patient and family. After consideration of risks, benefits and other options for treatment, the patient has consented to  Procedures: COLONOSCOPY (N/A) EGD (ESOPHAGOGASTRODUODENOSCOPY) (N/A) as a surgical intervention.  The patient's history has been reviewed, patient examined, no change in status, stable for surgery.  I have reviewed the patient's chart and labs.  Questions were answered to the patient's satisfaction.     Ole ONEIDA Schick  Ok to proceed with EGD/Colonoscopy

## 2024-05-16 NOTE — Transfer of Care (Signed)
 Immediate Anesthesia Transfer of Care Note  Patient: Ricky [name].  Procedure(s) Performed: COLONOSCOPY EGD (ESOPHAGOGASTRODUODENOSCOPY) BIOPSY, stomach POLYPECTOMY, INTESTINE  Patient Location: Endoscopy Unit  Anesthesia Type:General  Level of Consciousness: drowsy  Airway & Oxygen Therapy: Patient Spontanous Breathing  Post-op Assessment: Report given to RN  Post vital signs: stable  Last Vitals:  Vitals Value Taken Time  BP 99/63 05/16/24 09:53  Temp    Pulse 59 05/16/24 09:53  Resp 13 05/16/24 09:53  SpO2 97 % 05/16/24 09:53  Vitals shown include unfiled device data.  Last Pain:  Vitals:   05/16/24 0813  TempSrc: Tympanic  PainSc: 0-No pain         Complications: No notable events documented.

## 2024-05-16 NOTE — Op Note (Signed)
 [facility] Patient Name: Ricky [name] Procedure Date: 05/16/2024 9:12 AM MRN: [id number] Account #: [id number] Date of Birth: October 23, 1955 Admit Type: Outpatient Age: 69 Room: [facility] ENDO ROOM 1 Gender: Male Note Status: Finalized Instrument Name: Colon Scope [id number] Procedure:             Colonoscopy Indications:           Screening for colorectal malignant neoplasm,                         Incidental constipation and unintentional weight loss Providers:             Ole Schick MD, MD Referring MD:          Ophelia Sage, MD (Referring MD) Medicines:             Monitored Anesthesia Care Complications:         No immediate complications. Estimated blood loss:                         Minimal. Procedure:             Pre-Anesthesia Assessment:                        - Prior to the procedure, a History and Physical was                         performed, and patient medications and allergies were                         reviewed. The patient is competent. The risks and                         benefits of the procedure and the sedation options and                         risks were discussed with the patient. All questions                         were answered and informed consent was obtained.                         Patient identification and proposed procedure were                         verified by the physician, the nurse, the                         anesthesiologist, the anesthetist and the technician                         in the endoscopy suite. Mental Status Examination:                         alert and oriented. Airway Examination: normal                         oropharyngeal airway and neck mobility. Respiratory  Examination: clear to auscultation. CV Examination:                         normal. Prophylactic Antibiotics: The patient does not                         require prophylactic antibiotics. Prior                          Anticoagulants: The patient has taken no anticoagulant                         or antiplatelet agents. ASA Grade Assessment: III - A                         patient with severe systemic disease. After reviewing                         the risks and benefits, the patient was deemed in                         satisfactory condition to undergo the procedure. The                         anesthesia plan was to use monitored anesthesia care                         (MAC). Immediately prior to administration of                         medications, the patient was re-assessed for adequacy                         to receive sedatives. The heart rate, respiratory                         rate, oxygen saturations, blood pressure, adequacy of                         pulmonary ventilation, and response to care were                         monitored throughout the procedure. The physical                         status of the patient was re-assessed after the                         procedure.                        After obtaining informed consent, the colonoscope was                         passed under direct vision. Throughout the procedure,                         the patient's blood pressure, pulse, and oxygen  saturations were monitored continuously. The                         Colonoscope was introduced through the anus and                         advanced to the the cecum, identified by appendiceal                         orifice and ileocecal valve. The colonoscopy was                         performed without difficulty. The patient tolerated                         the procedure well. The quality of the bowel                         preparation was adequate to identify polyps. The                         ileocecal valve, appendiceal orifice, and rectum were                         photographed. Findings:      The perianal and digital rectal examinations were  normal.      Two sessile polyps were found in the ascending colon. The polyps were 5       to 8 mm in size. These polyps were removed with a cold snare. Resection       and retrieval were complete. Estimated blood loss was minimal.      A 3 mm polyp was found in the proximal transverse colon. The polyp was       sessile. The polyp was removed with a cold snare. Resection and       retrieval were complete. Estimated blood loss was minimal.      Internal hemorrhoids were found during retroflexion. The hemorrhoids       were Grade I (internal hemorrhoids that do not prolapse).      The exam was otherwise without abnormality on direct and retroflexion       views. Impression:            - Two 5 to 8 mm polyps in the ascending colon, removed                         with a cold snare. Resected and retrieved.                        - One 3 mm polyp in the proximal transverse colon,                         removed with a cold snare. Resected and retrieved.                        - Internal hemorrhoids.                        - The examination was otherwise normal on direct and  retroflexion views. Recommendation:        - Discharge patient to home.                        - Resume previous diet.                        - Continue present medications.                        - Await pathology results.                        - Repeat colonoscopy in 3 years for surveillance.                        - Return to referring physician as previously                         scheduled. Procedure Code(s):     --- Professional ---                        [phone], Colonoscopy, flexible; with removal of                         tumor(s), polyp(s), or other lesion(s) by snare                         technique Diagnosis Code(s):     --- Professional ---                        Z12.11, Encounter for screening for malignant neoplasm                         of colon                        D12.2,  Benign neoplasm of ascending colon                        D12.3, Benign neoplasm of transverse colon (hepatic                         flexure or splenic flexure)                        K64.0, First degree hemorrhoids CPT copyright 2022 [employer]. All rights reserved. The codes documented in this report are preliminary and upon coder review may  be revised to meet current compliance requirements. Ole Schick MD, MD 05/16/2024 10:03:47 AM Number of Addenda: 0 Note Initiated On: 05/16/2024 9:12 AM Scope Withdrawal Time: 0 hours 9 minutes 3 seconds  Total Procedure Duration: 0 hours 14 minutes 1 second  Estimated Blood Loss:  Estimated blood loss was minimal.      [facility]

## 2024-05-17 LAB — SURGICAL PATHOLOGY

## 2024-05-23 ENCOUNTER — Encounter: Payer: Self-pay | Admitting: Physical Therapy

## 2024-05-23 ENCOUNTER — Ambulatory Visit: Attending: Internal Medicine | Admitting: Physical Therapy

## 2024-05-23 DIAGNOSIS — R2689 Other abnormalities of gait and mobility: Secondary | ICD-10-CM | POA: Diagnosis present

## 2024-05-23 DIAGNOSIS — G20C Parkinsonism, unspecified: Secondary | ICD-10-CM | POA: Insufficient documentation

## 2024-05-23 DIAGNOSIS — M6281 Muscle weakness (generalized): Secondary | ICD-10-CM | POA: Insufficient documentation

## 2024-05-23 NOTE — Therapy (Signed)
 " OUTPATIENT PHYSICAL THERAPY LOWER EXTREMITY TREATMENT  Patient Name: Ricky [name]. MRN: [id number] DOB:[birth year], 69 y.o., male Today's Date: 05/23/2024  END OF SESSION:  PT End of Session - 05/23/24 0903     Visit Number 13    Number of Visits 18    Date for Recertification  06/21/24    PT Start Time 0903    PT Stop Time 0952    PT Time Calculation (min) 49 min    Activity Tolerance Patient tolerated treatment well;No increased pain;Patient limited by fatigue    Behavior During Therapy [facility] for tasks assessed/performed         Past Medical History:  Diagnosis Date   Anemia    Anxiety    Arthritis    Carpal tunnel syndrome    Depression    Diabetes (HCC)    Hyperlipidemia    Hypertension    Loss of teeth due to extraction    Neoplasm related pain    Parkinson's disease [facility])    Past Surgical History:  Procedure Laterality Date   CARPAL TUNNEL RELEASE Right 11/28/2020   Procedure: CARPAL TUNNEL RELEASE ENDOSCOPIC;  Surgeon: Edie Norleen PARAS, MD;  Location: [facility] ORS;  Service: Orthopedics;  Laterality: Right;   CARPAL TUNNEL RELEASE Left 01/22/2021   Procedure: CARPAL TUNNEL RELEASE ENDOSCOPIC;  Surgeon: Edie Norleen PARAS, MD;  Location: [facility] ORS;  Service: Orthopedics;  Laterality: Left;   COLONOSCOPY N/A 05/16/2024   Procedure: COLONOSCOPY;  Surgeon: Maryruth Ole DASEN, MD;  Location: [facility] ENDOSCOPY;  Service: Endoscopy;  Laterality: N/A;   ESOPHAGOGASTRODUODENOSCOPY N/A 05/16/2024   Procedure: EGD (ESOPHAGOGASTRODUODENOSCOPY);  Surgeon: Maryruth Ole DASEN, MD;  Location: [facility] ENDOSCOPY;  Service: Endoscopy;  Laterality: N/A;   INSERTION, GASTROSTOMY TUBE, ROBOT-ASSISTED N/A 09/30/2023   Procedure: INSERTION, GASTROSTOMY TUBE, ROBOT-ASSISTED;  Surgeon: Jordis Laneta FALCON, MD;  Location: [facility] ORS;  Service: General;  Laterality: N/A;   IR IMAGING GUIDED PORT INSERTION  06/30/2023   IR REMOVAL TUN ACCESS W/ PORT W/O FL MOD SED  12/29/2023   KNEE ARTHROSCOPY Right    KNEE  ARTHROSCOPY Left    MANDIBLE FRACTURE SURGERY     as teenager   POLYPECTOMY  05/16/2024   Procedure: POLYPECTOMY, INTESTINE;  Surgeon: Maryruth Ole DASEN, MD;  Location: [facility] ENDOSCOPY;  Service: Endoscopy;;   RECTAL BIOPSY  05/16/2024   Procedure: BIOPSY, stomach;  Surgeon: Maryruth Ole DASEN, MD;  Location: [facility] ENDOSCOPY;  Service: Endoscopy;;   Patient Active Problem List   Diagnosis Date Noted   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) 06/17/2023   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   Alcohol use disorder, moderate, in sustained remission (HCC) 05/18/2023   Somnolence 01/08/2023   Dyslipidemia 02/02/2022   B12 deficiency 11/14/2021   Overweight (BMI 25.0-29.9) 08/27/2021   Major depression 08/26/2021   Hyponatremia    Parkinson's disease (HCC)    Essential hypertension    Type 2 diabetes mellitus with hyperlipidemia (HCC)    Carpal tunnel syndrome, left 11/08/2020   Chronic bilateral thoracic back pain 10/15/2020   Lumbar spine pain 10/15/2020   Thoracic radiculitis 10/15/2020   Chronic pain syndrome 10/15/2020   Bilateral hand numbness 10/09/2020   Moderate episode of recurrent major depressive disorder (HCC) 03/06/2020   Spinal stenosis of thoracic region 01/18/2020   Neck pain 12/14/2019   Radiculitis of right cervical region 12/14/2019   Erectile dysfunction due to diseases classified elsewhere 05/31/2019   Hx of  chronic eczema 05/31/2019   Hyperlipidemia associated with type 2 diabetes mellitus (HCC) 05/31/2019   Carpal tunnel syndrome, right 03/15/2019   Eczema 06/27/2018   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 04/23/2017   Anxiety 10/26/2012   Seborrhea 10/26/2012   Social phobia 10/26/2012   Bell's palsy 11/19/2003    PCP: Fernande Ophelia JINNY DOUGLAS, MD  REFERRING PROVIDER: Fernande Ophelia JINNY DOUGLAS, MD  REFERRING DIAG: G20.C (ICD-10-CM) - Parkinsonism, unspecified  Parkinsonism type (HCC)   THERAPY DIAG:  Muscle weakness (generalized)  Balance problem  Other abnormalities of gait and mobility  Rationale for Evaluation and Treatment: Rehabilitation  ONSET DATE: Parkinson's Dx: ~ 4 years ago  SUBJECTIVE:   SUBJECTIVE STATEMENT: Pt presents to physical therapy with referral for Parkinsonism and concerns of general muscle wasting/weakening which has lead to difficulty with prolonged standing, community ambulation, and negotiating stairs at times. Pt was diagnosed with Parkinson's disease roughly 4 years ago. Patient also received treatment for throat cancer which he partially attributes to feeling weaker, losing weight, and having falls around the house roughly 4 months ago. Patient ended up in the hospital in June 2025 after frequent falls and visual hallucinations where the medications he was taking where adjusted and he has not had any falls since this time. Pt reports feeling unsteady when ambulating or standing for a prolonged period of time along with UE and LE muscles feeling weak.   PERTINENT HISTORY: Patient with PMH including major depression, dysphagia, peripheral neuropathy in feet, and visual impairment (Left eye worse than Right) PAIN:  Are you having pain? No Mild right ankle pain when he wakes up in the morning which improves with movement  PRECAUTIONS: None  RED FLAGS: None   WEIGHT BEARING RESTRICTIONS: No  FALLS:  Has patient fallen in last 6 months? Yes. Number of falls Pt had frequent falls roughly 5 months ago along with hallucinations/altered mental status which resulted in hospitalization in June 2025. Pt has not had any falls since this time period but reports feeling weak.   LIVING ENVIRONMENT: Lives with: lives with their spouse Lives in: House/apartment Stairs: Yes: External: 4-5 steps; can reach both Has following equipment at home: None  OCCUPATION: Mechanic  PLOF: Independent  PATIENT GOALS: To improve  independence around household and with community activity and to decrease fall risk.  NEXT MD VISIT: Neurology appointment on 03/28/2024  OBJECTIVE:  Note: Objective measures were completed at Evaluation unless otherwise noted.  PATIENT SURVEYS:  ABC Scale: 56.88%  COGNITION: Overall cognitive status: Within functional limits for tasks assessed     SENSATION: Light touch: WFL  Coordination: Rapid alternating movements of UEs: negative Heel to shin coordination test: negative  POSTURE: rounded shoulders and forward head resting tremor of bilateral UEs noted  LOWER EXTREMITY ROM:  Active ROM Right eval Left eval  Hip flexion [facility]  Hip extension    Hip abduction    Hip adduction    Hip internal rotation    Hip external rotation    Knee flexion [facility]  Knee extension [facility]  Ankle dorsiflexion [facility] WFL  Ankle plantarflexion    Ankle inversion    Ankle eversion     (Blank rows = not tested)  LOWER EXTREMITY MMT:  MMT Right eval Left eval  Hip flexion 4- 4-  Hip extension    Hip abduction 4 4  Hip adduction    Hip internal rotation 3+ 3+  Hip external rotation  3+ 3+  Knee flexion 5 5  Knee extension 4+ 4+  Ankle dorsiflexion 5 5  Ankle plantarflexion    Ankle inversion    Ankle eversion     (Blank rows = not tested)  FUNCTIONAL TESTS:  5xSTS: 13.09 seconds FGA: 19/30 (biggest challenges with narrow BOS, eyes closed, change in gait speed, and backwards ambulation) mCTSB: moderate sway noted under conditions with EC   GAIT: Distance walked: 30 ft Assistive device utilized: None Level of assistance: Complete Independence Comments: Pt ambulates with reciprocal gait pattern, narrow BOS, decreased cadence  STAIRS: Patient ascends and descends stairs with step to gait pattern, use of bilateral HRs, and requires increased time to descend safely (decreased eccentric control).  : 1050 ft (fatigue noted upon completion)  BERG: 53/56  5xSTS: 9.93  second/ 8.08 seconds  ABC:  95% marked improvement  : 1,152 feet (no c/o fatigue today).   Berg balance:  55/56 (marked improvement)                                                                                                                              TREATMENT DATE: 05/23/2024    Subjective:   Pt has been feeling good today has been been doing a few squats and calf raises. Feeling improvements in leg strength. Pt reports no falls or stumbles since last session. Pt reports no pain but back is a little sore from doing squats. Pt arms feeling really weak.   Nustep L6 10 min. B UE/LE  (discussed weekend activities/ HEP (resisted UE)/ sleeping issues)- no rest breaks or c/o pain.    Therapeutic Exercise  (10# AWs) Seated LAQ/ marching, 20x  (good technique/ minimal fatigue reported).  Walking in //-bars with 6 hurdle clearance (forward/ lateral).    Reviewed HEP  Therapeutic Activity  Stair step ups 10# reciprocal gait pattern 15x each leg.  STS with 8# med ball 15x.  Good technique/ cadence.   Resisted gait Forward/backwards/ Lateral L/R, 4x each in // bars with SBA/CGA.  Tandem walking in parallel bars over airex pad with CGA gait belt 4x 10 ft. Pt required several touches to bar to keep balance.   Blue band resisted lateral walking in // bars 4x with CGA.   Walking outside over uneven terrain with CGA.   PATIENT EDUCATION:  Education details: Pt and spouse of pt educated on PT prognosis and POC Person educated: Patient and Spouse Education method: Explanation and Verbal cues Education comprehension: verbalized understanding  HOME EXERCISE PROGRAM: Access Code: [email or web address] URL: [email or web address] Date: 03/29/2024 Prepared by: Ozell Sero Exercises - Standing Hip Extension with Ankle Weight - 1 x daily - 3-5 x weekly - 2 sets - 15 reps - Standing Hip Abduction AROM - 1 x daily - 3-5 x weekly - 2 sets - 15 reps - Sit to Stand Without Arm Support - 1 x  daily - 3-5 x weekly - 2 sets - 10 reps - Seated  Long Arc Quad - 1 x daily - 3-5 x weekly - 2 sets - 10 reps - Heel Raises with Counter Support - 1 x daily - 3-5 x weekly - 2 sets - 15 reps    Access Code: [email or web address] URL: [email or web address] Date: 04/17/2024 Prepared by: Ozell Sero  Exercises - Standing Marching  - 1 x daily - 3-5 x weekly - 2 sets - 15 reps - Standing Hip Extension with Ankle Weight  - 1 x daily - 3-5 x weekly - 2 sets - 15 reps - Standing Hip Abduction AROM  - 1 x daily - 3-5 x weekly - 2 sets - 15 reps - Heel Raises with Counter Support  - 1 x daily - 3-5 x weekly - 2 sets - 15 reps - Sit to Stand Without Arm Support  - 1 x daily - 3-5 x weekly - 2 sets - 10 reps - Seated Long Arc Quad  - 1 x daily - 3-5 x weekly - 2 sets - 10 reps  ASSESSMENT:  CLINICAL IMPRESSION: Pt works hard on exercises and has had no pain with exercises. Pt. Ambulates with a more normalized gait program. Pt is still is limited by generalized fatigue in B LE.  Pt reports feeling balance is ok. Pt. Continues to progress towards all PT goals.  Pt. Will continue to benefit from skilled PT services to increase B UE/LE muscle strength to improve dynamic balance/ ambulation on varying terrain.     OBJECTIVE IMPAIRMENTS: Abnormal gait, decreased balance, decreased endurance, decreased knowledge of condition, decreased safety awareness, and postural dysfunction.   ACTIVITY LIMITATIONS: carrying, lifting, standing, squatting, sleeping, stairs, transfers, and locomotion level  PARTICIPATION LIMITATIONS: community activity and occupation  PERSONAL FACTORS: Fitness and 3+ comorbidities: anxiety/depression, Parkinson's disease, HTN, Diabetes, hx of CA, and peripheral neuropathy are also affecting patient's functional outcome.   REHAB POTENTIAL: Good  CLINICAL DECISION MAKING: Evolving/moderate complexity  EVALUATION COMPLEXITY: Moderate   GOALS: Goals reviewed with patient?  Yes  LONG TERM GOALS: Target date: 06/21/2024  Pt will increase ABC scale score to at least a 68% so that he experiences an improvement in self-confidence with balance related activities.  Baseline: 56.88%.  12/8: 95%  Goal status: Goal met  2.  Pt will improve FGA score to at least a 23/30 so that patient will experience an improvement in dynamic balance and functional tasks with greater ease.   Baseline: 19/30  Goal status: On-going  3.  Pt will improve distance by at least 70 ft without the use of an assistive device to improve ability to ambulate community distances safely.   Baseline: 1050 ft  12/15: 1,152 feet (no c/o fatigue today).  Goal status: Goal met  4.  Pt. Independent with HEP to increase B shoulder flexion/ abduction to 4+/5 MMT to improve carrying/ lifting tasks with no pain.    Baseline: see above  Goal status:  Initial  5.  Pt. Able to complete 45 minutes of there.ex. with no increase c/o pain or generalized muscle fatigue to improve functional mobility with daily activities/ ADL.      Baseline: see above    Goal status: Initial  PLAN:  PT FREQUENCY: 1x/week  PT DURATION: 6 weeks  PLANNED INTERVENTIONS: [id number]- PT Re-evaluation, 97110-Therapeutic exercises, 97530- Therapeutic activity, 97112- Neuromuscular re-education, 97535- Self Care, [id number]- Manual therapy, Patient/Family education, Balance training, Stair training, Cryotherapy, and Moist heat  PLAN FOR NEXT SESSION: Progress endurance based activities and static and dynamic balance tasks (  focusing on tandem/narrow BOS and eyes closed activities).   Review UE ex.  Ozell JAYSON Sero, PT, DPT # [id number] [place], SPT 05/23/2024, 1:58 PM  "

## 2024-05-24 ENCOUNTER — Ambulatory Visit: Admitting: Physical Therapy

## 2024-05-26 ENCOUNTER — Other Ambulatory Visit: Payer: Self-pay | Admitting: Psychiatry

## 2024-05-30 ENCOUNTER — Ambulatory Visit: Admitting: Psychiatry

## 2024-05-31 ENCOUNTER — Other Ambulatory Visit: Payer: Self-pay | Admitting: Psychiatry

## 2024-05-31 ENCOUNTER — Ambulatory Visit: Admitting: Physical Therapy

## 2024-05-31 ENCOUNTER — Telehealth: Payer: Self-pay

## 2024-05-31 ENCOUNTER — Encounter: Payer: Self-pay | Admitting: Physical Therapy

## 2024-05-31 DIAGNOSIS — G20C Parkinsonism, unspecified: Secondary | ICD-10-CM

## 2024-05-31 DIAGNOSIS — M6281 Muscle weakness (generalized): Secondary | ICD-10-CM

## 2024-05-31 DIAGNOSIS — R2689 Other abnormalities of gait and mobility: Secondary | ICD-10-CM

## 2024-05-31 MED ORDER — BUPROPION HCL ER (XL) 300 MG PO TB24: 300.0000 mg | ORAL_TABLET | Freq: Every day | ORAL | 5 refills | Status: AC

## 2024-05-31 MED ORDER — RAMELTEON 8 MG PO TABS: 8.0000 mg | ORAL_TABLET | Freq: Every day | ORAL | 0 refills | Status: AC

## 2024-05-31 NOTE — Telephone Encounter (Signed)
 Ordered

## 2024-05-31 NOTE — Therapy (Signed)
 " OUTPATIENT PHYSICAL THERAPY LOWER EXTREMITY TREATMENT  Patient Name: Ricky [name]. MRN: [id number] DOB:04-03-1956, 69 y.o., male Today's Date: 05/31/2024  END OF SESSION:  PT End of Session - 05/31/24 0936     Visit Number 14    Number of Visits 18    Date for Recertification  06/21/24    PT Start Time 0940    PT Stop Time 1030    PT Time Calculation (min) 50 min    Activity Tolerance Patient tolerated treatment well;No increased pain;Patient limited by fatigue    Behavior During Therapy [facility] for tasks assessed/performed         Past Medical History:  Diagnosis Date   Anemia    Anxiety    Arthritis    Carpal tunnel syndrome    Depression    Diabetes (HCC)    Hyperlipidemia    Hypertension    Loss of teeth due to extraction    Neoplasm related pain    Parkinson's disease [facility])    Past Surgical History:  Procedure Laterality Date   CARPAL TUNNEL RELEASE Right 11/28/2020   Procedure: CARPAL TUNNEL RELEASE ENDOSCOPIC;  Surgeon: Edie Norleen PARAS, MD;  Location: [facility] ORS;  Service: Orthopedics;  Laterality: Right;   CARPAL TUNNEL RELEASE Left 01/22/2021   Procedure: CARPAL TUNNEL RELEASE ENDOSCOPIC;  Surgeon: Edie Norleen PARAS, MD;  Location: [facility] ORS;  Service: Orthopedics;  Laterality: Left;   COLONOSCOPY N/A 05/16/2024   Procedure: COLONOSCOPY;  Surgeon: Maryruth Ole DASEN, MD;  Location: [facility] ENDOSCOPY;  Service: Endoscopy;  Laterality: N/A;   ESOPHAGOGASTRODUODENOSCOPY N/A 05/16/2024   Procedure: EGD (ESOPHAGOGASTRODUODENOSCOPY);  Surgeon: Maryruth Ole DASEN, MD;  Location: [facility] ENDOSCOPY;  Service: Endoscopy;  Laterality: N/A;   INSERTION, GASTROSTOMY TUBE, ROBOT-ASSISTED N/A 09/30/2023   Procedure: INSERTION, GASTROSTOMY TUBE, ROBOT-ASSISTED;  Surgeon: Jordis Laneta FALCON, MD;  Location: [facility] ORS;  Service: General;  Laterality: N/A;   IR IMAGING GUIDED PORT INSERTION  06/30/2023   IR REMOVAL TUN ACCESS W/ PORT W/O FL MOD SED  12/29/2023   KNEE ARTHROSCOPY Right    KNEE  ARTHROSCOPY Left    MANDIBLE FRACTURE SURGERY     as teenager   POLYPECTOMY  05/16/2024   Procedure: POLYPECTOMY, INTESTINE;  Surgeon: Maryruth Ole DASEN, MD;  Location: [facility] ENDOSCOPY;  Service: Endoscopy;;   RECTAL BIOPSY  05/16/2024   Procedure: BIOPSY, stomach;  Surgeon: Maryruth Ole DASEN, MD;  Location: [facility] ENDOSCOPY;  Service: Endoscopy;;   Patient Active Problem List   Diagnosis Date Noted   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) 06/17/2023   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   Alcohol use disorder, moderate, in sustained remission (HCC) 05/18/2023   Somnolence 01/08/2023   Dyslipidemia 02/02/2022   B12 deficiency 11/14/2021   Overweight (BMI 25.0-29.9) 08/27/2021   Major depression 08/26/2021   Hyponatremia    Parkinson's disease (HCC)    Essential hypertension    Type 2 diabetes mellitus with hyperlipidemia (HCC)    Carpal tunnel syndrome, left 11/08/2020   Chronic bilateral thoracic back pain 10/15/2020   Lumbar spine pain 10/15/2020   Thoracic radiculitis 10/15/2020   Chronic pain syndrome 10/15/2020   Bilateral hand numbness 10/09/2020   Moderate episode of recurrent major depressive disorder (HCC) 03/06/2020   Spinal stenosis of thoracic region 01/18/2020   Neck pain 12/14/2019   Radiculitis of right cervical region 12/14/2019   Erectile dysfunction due to diseases classified elsewhere 05/31/2019   Hx of  chronic eczema 05/31/2019   Hyperlipidemia associated with type 2 diabetes mellitus (HCC) 05/31/2019   Carpal tunnel syndrome, right 03/15/2019   Eczema 06/27/2018   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 04/23/2017   Anxiety 10/26/2012   Seborrhea 10/26/2012   Social phobia 10/26/2012   Bell's palsy 11/19/2003    PCP: Fernande Ophelia JINNY DOUGLAS, MD  REFERRING PROVIDER: Fernande Ophelia JINNY DOUGLAS, MD  REFERRING DIAG: G20.C (ICD-10-CM) - Parkinsonism, unspecified  Parkinsonism type (HCC)   THERAPY DIAG:  Muscle weakness (generalized)  Balance problem  Other abnormalities of gait and mobility  Parkinsonism, unspecified Parkinsonism type (HCC)  Rationale for Evaluation and Treatment: Rehabilitation  ONSET DATE: Parkinson's Dx: ~ 4 years ago  SUBJECTIVE:   SUBJECTIVE STATEMENT: Pt presents to physical therapy with referral for Parkinsonism and concerns of general muscle wasting/weakening which has lead to difficulty with prolonged standing, community ambulation, and negotiating stairs at times. Pt was diagnosed with Parkinson's disease roughly 4 years ago. Patient also received treatment for throat cancer which he partially attributes to feeling weaker, losing weight, and having falls around the house roughly 4 months ago. Patient ended up in the hospital in June 2025 after frequent falls and visual hallucinations where the medications he was taking where adjusted and he has not had any falls since this time. Pt reports feeling unsteady when ambulating or standing for a prolonged period of time along with UE and LE muscles feeling weak.   PERTINENT HISTORY: Patient with PMH including major depression, dysphagia, peripheral neuropathy in feet, and visual impairment (Left eye worse than Right) PAIN:  Are you having pain? No Mild right ankle pain when he wakes up in the morning which improves with movement  PRECAUTIONS: None  RED FLAGS: None   WEIGHT BEARING RESTRICTIONS: No  FALLS:  Has patient fallen in last 6 months? Yes. Number of falls Pt had frequent falls roughly 5 months ago along with hallucinations/altered mental status which resulted in hospitalization in June 2025. Pt has not had any falls since this time period but reports feeling weak.   LIVING ENVIRONMENT: Lives with: lives with their spouse Lives in: House/apartment Stairs: Yes: External: 4-5 steps; can reach both Has following equipment at home: None  OCCUPATION:  Mechanic  PLOF: Independent  PATIENT GOALS: To improve independence around household and with community activity and to decrease fall risk.  NEXT MD VISIT: Neurology appointment on 03/28/2024  OBJECTIVE:  Note: Objective measures were completed at Evaluation unless otherwise noted.  PATIENT SURVEYS:  ABC Scale: 56.88%  COGNITION: Overall cognitive status: Within functional limits for tasks assessed     SENSATION: Light touch: WFL  Coordination: Rapid alternating movements of UEs: negative Heel to shin coordination test: negative  POSTURE: rounded shoulders and forward head resting tremor of bilateral UEs noted  LOWER EXTREMITY ROM:  Active ROM Right eval Left eval  Hip flexion [facility]  Hip extension    Hip abduction    Hip adduction    Hip internal rotation    Hip external rotation    Knee flexion [facility]  Knee extension [facility]  Ankle dorsiflexion [facility] WFL  Ankle plantarflexion    Ankle inversion    Ankle eversion     (Blank rows = not tested)  LOWER EXTREMITY MMT:  MMT Right eval Left eval  Hip flexion 4- 4-  Hip extension    Hip abduction 4 4  Hip adduction    Hip internal rotation  3+ 3+  Hip external rotation 3+ 3+  Knee flexion 5 5  Knee extension 4+ 4+  Ankle dorsiflexion 5 5  Ankle plantarflexion    Ankle inversion    Ankle eversion     (Blank rows = not tested)  FUNCTIONAL TESTS:  5xSTS: 13.09 seconds FGA: 19/30 (biggest challenges with narrow BOS, eyes closed, change in gait speed, and backwards ambulation) mCTSB: moderate sway noted under conditions with EC   GAIT: Distance walked: 30 ft Assistive device utilized: None Level of assistance: Complete Independence Comments: Pt ambulates with reciprocal gait pattern, narrow BOS, decreased cadence  STAIRS: Patient ascends and descends stairs with step to gait pattern, use of bilateral HRs, and requires increased time to descend safely (decreased eccentric control).  : 1050 ft  (fatigue noted upon completion)  BERG: 53/56  5xSTS: 9.93 second/ 8.08 seconds  ABC:  95% marked improvement  : 1,152 feet (no c/o fatigue today).   Berg balance:  55/56 (marked improvement)                                                                                                                              TREATMENT DATE: 05/31/2024    Subjective:   Pt has been feeling good today and has been been doing a few squats/calf raises at home. Feeling improvements in leg strength. Pt reports no falls or stumbles since last session. Has been feeling fatigued recently and had a recent visit with MD (see MD notes).  Therapeutic Exercise  Nustep L6 10 min. B UE/LE  (discussed weekend activities/ HEP (discussed upcoming weekend)- no rest breaks or c/o pain.    (10# AWs) Seated LAQ/ marching,  Standing Hamstring curls 20x  (good technique/ minimal fatigue reported).   Reviewed HEP  Therapeutic Activity  Hallway walk with normal gait/ marching gait aprox 280 ft.  Good balance/ hip stability.    (10#AW) // bars 5 laps tandem gait used UE a few times to regain balance, 5 laps marching. Stair step ups reciprocal gait pattern 20x each leg.  Standing on airex in // bars with magnetic fishing pole, picking up magnetic fish 2 rounds. Working on functional reach and balance. 2nd round with NBOS.  No UE assist required on //-bars and able to step up/down on Airex with no issues.    [place] 110# Resisted gait Forward/backwards/ Lateral L/R, 4x each with SBA (only 2 laps facing left stopped due to fatigue.  PATIENT EDUCATION:  Education details: Pt and spouse of pt educated on PT prognosis and POC Person educated: Patient and Spouse Education method: Explanation and Verbal cues Education comprehension: verbalized understanding  HOME EXERCISE PROGRAM: Access Code: [email or web address] URL: [email or web address] Date: 03/29/2024 Prepared by: Ozell Sero Exercises - Standing Hip  Extension with Ankle Weight - 1 x daily - 3-5 x weekly - 2 sets - 15 reps - Standing Hip Abduction AROM - 1 x daily - 3-5 x weekly - 2 sets - 15  reps - Sit to Stand Without Arm Support - 1 x daily - 3-5 x weekly - 2 sets - 10 reps - Seated Long Arc Quad - 1 x daily - 3-5 x weekly - 2 sets - 10 reps - Heel Raises with Counter Support - 1 x daily - 3-5 x weekly - 2 sets - 15 reps    Access Code: [email or web address] URL: [email or web address] Date: 04/17/2024 Prepared by: Ozell Sero  Exercises - Standing Marching  - 1 x daily - 3-5 x weekly - 2 sets - 15 reps - Standing Hip Extension with Ankle Weight  - 1 x daily - 3-5 x weekly - 2 sets - 15 reps - Standing Hip Abduction AROM  - 1 x daily - 3-5 x weekly - 2 sets - 15 reps - Heel Raises with Counter Support  - 1 x daily - 3-5 x weekly - 2 sets - 15 reps - Sit to Stand Without Arm Support  - 1 x daily - 3-5 x weekly - 2 sets - 10 reps - Seated Long Arc Quad  - 1 x daily - 3-5 x weekly - 2 sets - 10 reps  ASSESSMENT:  CLINICAL IMPRESSION: Pt works hard on exercise and has no pain with activity.  Pt ambulates with normalized gait pattern and continues to show improvement in overall endurance with less frequent rest breaks. Pts balance is improving with few touches of the // bars during tandem stance/ dynamic activites. Pt continues to progress towards PT goals.  Pt. Will continue to benefit from skilled PT services to increase B UE/LE muscle strength to improve dynamic balance/ ambulation on varying terrain.      OBJECTIVE IMPAIRMENTS: Abnormal gait, decreased balance, decreased endurance, decreased knowledge of condition, decreased safety awareness, and postural dysfunction.   ACTIVITY LIMITATIONS: carrying, lifting, standing, squatting, sleeping, stairs, transfers, and locomotion level  PARTICIPATION LIMITATIONS: community activity and occupation  PERSONAL FACTORS: Fitness and 3+ comorbidities: anxiety/depression, Parkinson's disease,  HTN, Diabetes, hx of CA, and peripheral neuropathy are also affecting patient's functional outcome.   REHAB POTENTIAL: Good  CLINICAL DECISION MAKING: Evolving/moderate complexity  EVALUATION COMPLEXITY: Moderate   GOALS: Goals reviewed with patient? Yes  LONG TERM GOALS: Target date: 06/21/2024  Pt will increase ABC scale score to at least a 68% so that he experiences an improvement in self-confidence with balance related activities.  Baseline: 56.88%.  12/8: 95%  Goal status: Goal met  2.  Pt will improve FGA score to at least a 23/30 so that patient will experience an improvement in dynamic balance and functional tasks with greater ease.   Baseline: 19/30  Goal status: On-going  3.  Pt will improve distance by at least 70 ft without the use of an assistive device to improve ability to ambulate community distances safely.   Baseline: 1050 ft  12/15: 1,152 feet (no c/o fatigue today).  Goal status: Goal met  4.  Pt. Independent with HEP to increase B shoulder flexion/ abduction to 4+/5 MMT to improve carrying/ lifting tasks with no pain.    Baseline: see above  Goal status:  Initial  5.  Pt. Able to complete 45 minutes of there.ex. with no increase c/o pain or generalized muscle fatigue to improve functional mobility with daily activities/ ADL.      Baseline: see above    Goal status: Initial  PLAN:  PT FREQUENCY: 1x/week  PT DURATION: 6 weeks  PLANNED INTERVENTIONS: 97164- PT Re-evaluation, 97110-Therapeutic exercises, 97530- Therapeutic activity, 97112-  Neuromuscular re-education, [phone]- Self Care, 02859- Manual therapy, Patient/Family education, Balance training, Stair training, Cryotherapy, and Moist heat  PLAN FOR NEXT SESSION: Progress endurance based activities and static and dynamic balance tasks (focusing on tandem/narrow BOS and eyes closed activities).   Review UE ex.  Ozell JAYSON Sero, PT, DPT # [id number] [place], SPT 05/31/2024, 12:49 PM  "

## 2024-05-31 NOTE — Telephone Encounter (Signed)
 Pt wife (ROI ok ) left Voicemail requestingramelteon (ROZEREM ) 8 MG tablet  buPROPion  (WELLBUTRIN  XL) 300 MG 24 hr tablet  Pharmacy:[facility] - [place], [place] - [address]   Last seen:04/03/24 Next apt:  06/22/24

## 2024-06-02 ENCOUNTER — Other Ambulatory Visit: Payer: Self-pay

## 2024-06-02 DIAGNOSIS — R945 Abnormal results of liver function studies: Secondary | ICD-10-CM

## 2024-06-06 ENCOUNTER — Ambulatory Visit
Admission: RE | Admit: 2024-06-06 | Discharge: 2024-06-06 | Disposition: A | Discharge status: Home / Self Care | Source: Ambulatory Visit

## 2024-06-06 DIAGNOSIS — R1310 Dysphagia, unspecified: Secondary | ICD-10-CM | POA: Insufficient documentation

## 2024-06-06 NOTE — Therapy (Signed)
 Modified Barium Swallow Study  Patient Details  Name: Ricky [name]. MRN: [id number] Date of Birth: Jun 26, 1955  Today's Date: 06/06/2024  Modified Barium Swallow completed.  Full report located under Chart Review in the Imaging Section.  History of Present Illness Pt is a 69 year old male who presented with difficulty swallowing which has been ongoing for the last 6+ months associated with unintentional weight loss. Pt with c/o solid food dysphagia particularly with bread, coated/fried meats, and rice. Pt with hx of SCC s/p chemoradiaton with weekly Cisplatin  completed in May 2025. CT soft tissue neck which showed a 4 cm partially necrotic mass at the glossotonsillar sulcus on the right side and to level 2 lymph nodes measuring 15 and 14 mm. Pt dx with SCC. Patient completed Treatment complicated by cytopenias as well as malnutrition requiring PEG tube placement PET CT scan. After treatment completion in July 2025 showed complete metabolic response oropharyngeal primary and ipsilateral nodal metastases....  Patient has had mild leukopenia and thrombocytopenia since completing chemotherapy. EGD, January 2026 - No endoscopic esophageal abnormality to explain patient' s dysphagia. - Z- line irregular. - A single lesion diagnostic of aberrant pancreas...   Clinical Impression Pt seen for MBSS. Pt demonstrated a grossly intact oral swallow c/b prolonged mastication of solids which is likely due to dental status. Pharyngeal swallow function was [facility]. Concern for primary esophageal dysphagia given limited esophageal clearance and retroflow below the PES noted in AP view. Pt may benefit from further esophageal work up (e.g. manometry). Pt may benefit from f/u ST for  education given recent chemoradiation in setting of H&N SCC. Factors that may increase risk of adverse event in presence of aspiration Noe & Lianne 2021):  GERD  Swallow Evaluation Recommendations Recommendations: PO diet PO  Diet Recommendation: Regular;Thin liquids (Level 0) (well cut, moistened for comfort) Liquid Administration via: Spoon;Cup;Straw Medication Administration:  (as tolerated) Supervision: Patient able to self-feed Swallowing strategies  : Follow solids with liquids Postural changes: Position pt fully upright for meals;Out of bed for meals (upright 60-90 minutes after meals; follow MD directions for PPI use; reflux precautions) Oral care recommendations: Oral care BID (2x/day) Recommended consults: Consider esophageal assessment    Delon Bangs, M.S., CCC-SLP Speech-Language Pathologist [facility] [phone] (ASCOM)   Delon HERO Birdena Kingma 06/06/2024,1:47 PM

## 2024-06-07 ENCOUNTER — Ambulatory Visit: Admitting: Physical Therapy

## 2024-06-12 ENCOUNTER — Encounter: Payer: Self-pay | Admitting: Oncology

## 2024-06-14 ENCOUNTER — Ambulatory Visit: Attending: Internal Medicine | Admitting: Physical Therapy

## 2024-06-14 ENCOUNTER — Encounter: Payer: Self-pay | Admitting: Physical Therapy

## 2024-06-14 DIAGNOSIS — R2689 Other abnormalities of gait and mobility: Secondary | ICD-10-CM

## 2024-06-14 DIAGNOSIS — M6281 Muscle weakness (generalized): Secondary | ICD-10-CM

## 2024-06-14 DIAGNOSIS — G20C Parkinsonism, unspecified: Secondary | ICD-10-CM

## 2024-06-14 NOTE — Therapy (Signed)
 " OUTPATIENT PHYSICAL THERAPY LOWER EXTREMITY TREATMENT/ DISCHARGE  Patient Name: Ricky [name]. MRN: [id number] DOB:[birth year], 69 y.o., male Today's Date: 06/14/2024  END OF SESSION:  PT End of Session - 06/14/24 0943     Visit Number 15    Number of Visits 18    Date for Recertification  06/21/24    PT Start Time 0941    Activity Tolerance Patient tolerated treatment well;No increased pain;Patient limited by fatigue    Behavior During Therapy [facility] for tasks assessed/performed         0941 to 1032  (51 minutes)  Past Medical History:  Diagnosis Date   Anemia    Anxiety    Arthritis    Carpal tunnel syndrome    Depression    Diabetes (HCC)    Hyperlipidemia    Hypertension    Loss of teeth due to extraction    Neoplasm related pain    Parkinson's disease [facility])    Past Surgical History:  Procedure Laterality Date   CARPAL TUNNEL RELEASE Right 11/28/2020   Procedure: CARPAL TUNNEL RELEASE ENDOSCOPIC;  Surgeon: Edie Norleen PARAS, MD;  Location: [facility] ORS;  Service: Orthopedics;  Laterality: Right;   CARPAL TUNNEL RELEASE Left 01/22/2021   Procedure: CARPAL TUNNEL RELEASE ENDOSCOPIC;  Surgeon: Edie Norleen PARAS, MD;  Location: [facility] ORS;  Service: Orthopedics;  Laterality: Left;   COLONOSCOPY N/A 05/16/2024   Procedure: COLONOSCOPY;  Surgeon: Maryruth Ole DASEN, MD;  Location: [facility] ENDOSCOPY;  Service: Endoscopy;  Laterality: N/A;   ESOPHAGOGASTRODUODENOSCOPY N/A 05/16/2024   Procedure: EGD (ESOPHAGOGASTRODUODENOSCOPY);  Surgeon: Maryruth Ole DASEN, MD;  Location: [facility] ENDOSCOPY;  Service: Endoscopy;  Laterality: N/A;   INSERTION, GASTROSTOMY TUBE, ROBOT-ASSISTED N/A 09/30/2023   Procedure: INSERTION, GASTROSTOMY TUBE, ROBOT-ASSISTED;  Surgeon: Jordis Laneta FALCON, MD;  Location: [facility] ORS;  Service: General;  Laterality: N/A;   IR IMAGING GUIDED PORT INSERTION  06/30/2023   IR REMOVAL TUN ACCESS W/ PORT W/O FL MOD SED  12/29/2023   KNEE ARTHROSCOPY Right    KNEE ARTHROSCOPY Left     MANDIBLE FRACTURE SURGERY     as teenager   POLYPECTOMY  05/16/2024   Procedure: POLYPECTOMY, INTESTINE;  Surgeon: Maryruth Ole DASEN, MD;  Location: [facility] ENDOSCOPY;  Service: Endoscopy;;   RECTAL BIOPSY  05/16/2024   Procedure: BIOPSY, stomach;  Surgeon: Maryruth Ole DASEN, MD;  Location: [facility] ENDOSCOPY;  Service: Endoscopy;;   Patient Active Problem List   Diagnosis Date Noted   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) 06/17/2023   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   Alcohol use disorder, moderate, in sustained remission (HCC) 05/18/2023   Somnolence 01/08/2023   Dyslipidemia 02/02/2022   B12 deficiency 11/14/2021   Overweight (BMI 25.0-29.9) 08/27/2021   Major depression 08/26/2021   Hyponatremia    Parkinson's disease (HCC)    Essential hypertension    Type 2 diabetes mellitus with hyperlipidemia (HCC)    Carpal tunnel syndrome, left 11/08/2020   Chronic bilateral thoracic back pain 10/15/2020   Lumbar spine pain 10/15/2020   Thoracic radiculitis 10/15/2020   Chronic pain syndrome 10/15/2020   Bilateral hand numbness 10/09/2020   Moderate episode of recurrent major depressive disorder (HCC) 03/06/2020   Spinal stenosis of thoracic region 01/18/2020   Neck pain 12/14/2019   Radiculitis of right cervical region 12/14/2019   Erectile dysfunction due to diseases classified elsewhere 05/31/2019   Hx of chronic eczema 05/31/2019   Hyperlipidemia associated with  type 2 diabetes mellitus (HCC) 05/31/2019   Carpal tunnel syndrome, right 03/15/2019   Eczema 06/27/2018   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 04/23/2017   Anxiety 10/26/2012   Seborrhea 10/26/2012   Social phobia 10/26/2012   Bell's palsy 11/19/2003    PCP: Fernande Ophelia JINNY DOUGLAS, MD  REFERRING PROVIDER: Fernande Ophelia JINNY DOUGLAS, MD  REFERRING DIAG: G20.C (ICD-10-CM) - Parkinsonism, unspecified Parkinsonism type (HCC)    THERAPY DIAG:  Muscle weakness (generalized)  Balance problem  Other abnormalities of gait and mobility  Parkinsonism, unspecified Parkinsonism type (HCC)  Rationale for Evaluation and Treatment: Rehabilitation  ONSET DATE: Parkinson's Dx: ~ 4 years ago  SUBJECTIVE:   SUBJECTIVE STATEMENT: Pt presents to physical therapy with referral for Parkinsonism and concerns of general muscle wasting/weakening which has lead to difficulty with prolonged standing, community ambulation, and negotiating stairs at times. Pt was diagnosed with Parkinson's disease roughly 4 years ago. Patient also received treatment for throat cancer which he partially attributes to feeling weaker, losing weight, and having falls around the house roughly 4 months ago. Patient ended up in the hospital in June 2025 after frequent falls and visual hallucinations where the medications he was taking where adjusted and he has not had any falls since this time. Pt reports feeling unsteady when ambulating or standing for a prolonged period of time along with UE and LE muscles feeling weak.   PERTINENT HISTORY: Patient with PMH including major depression, dysphagia, peripheral neuropathy in feet, and visual impairment (Left eye worse than Right) PAIN:  Are you having pain? No Mild right ankle pain when he wakes up in the morning which improves with movement  PRECAUTIONS: None  RED FLAGS: None   WEIGHT BEARING RESTRICTIONS: No  FALLS:  Has patient fallen in last 6 months? Yes. Number of falls Pt had frequent falls roughly 5 months ago along with hallucinations/altered mental status which resulted in hospitalization in June 2025. Pt has not had any falls since this time period but reports feeling weak.   LIVING ENVIRONMENT: Lives with: lives with their spouse Lives in: House/apartment Stairs: Yes: External: 4-5 steps; can reach both Has following equipment at home: None  OCCUPATION: Mechanic  PLOF:  Independent  PATIENT GOALS: To improve independence around household and with community activity and to decrease fall risk.  NEXT MD VISIT: Neurology appointment on 03/28/2024  OBJECTIVE:  Note: Objective measures were completed at Evaluation unless otherwise noted.  PATIENT SURVEYS:  ABC Scale: 56.88%  COGNITION: Overall cognitive status: Within functional limits for tasks assessed     SENSATION: Light touch: WFL  Coordination: Rapid alternating movements of UEs: negative Heel to shin coordination test: negative  POSTURE: rounded shoulders and forward head resting tremor of bilateral UEs noted  LOWER EXTREMITY ROM:  Active ROM Right eval Left eval  Hip flexion [facility]  Hip extension    Hip abduction    Hip adduction    Hip internal rotation    Hip external rotation    Knee flexion [facility]  Knee extension [facility]  Ankle dorsiflexion [facility] WFL  Ankle plantarflexion    Ankle inversion    Ankle eversion     (Blank rows = not tested)  LOWER EXTREMITY MMT:  MMT Right eval Left eval  Hip flexion 4- 4-  Hip extension    Hip abduction 4 4  Hip adduction    Hip internal rotation 3+ 3+  Hip external rotation 3+ 3+  Knee flexion 5 5  Knee extension 4+ 4+  Ankle dorsiflexion 5 5  Ankle plantarflexion    Ankle inversion    Ankle eversion     (Blank rows = not tested)  FUNCTIONAL TESTS:  5xSTS: 13.09 seconds FGA: 19/30 (biggest challenges with narrow BOS, eyes closed, change in gait speed, and backwards ambulation) mCTSB: moderate sway noted under conditions with EC   GAIT: Distance walked: 30 ft Assistive device utilized: None Level of assistance: Complete Independence Comments: Pt ambulates with reciprocal gait pattern, narrow BOS, decreased cadence  STAIRS: Patient ascends and descends stairs with step to gait pattern, use of bilateral HRs, and requires increased time to descend safely (decreased eccentric control).  : 1050 ft (fatigue noted upon  completion)  BERG: 53/56  5xSTS: 9.93 second/ 8.08 seconds  ABC:  95% marked improvement  : 1,152 feet (no c/o fatigue today).   Berg balance:  55/56 (marked improvement)                                                                                                                              TREATMENT DATE: 06/14/2024    Subjective:   Pt has been feeling good and reports compliance with daily squats/ LE strengthening.  No reports of pain and no falls noted.    Therapeutic Exercise:  Nustep L6 10 min. B LE only  (discussed weekend activities/ HEP (pt. Doing squats everyday at home).    6 step ups (forward/ lateral)- 10x on L/R.  12 step ups 10x on L/R with no UE assist.    Seated LAQ/ marching 20x.   B LE MMT grossly 5/5 MMT   Therapeutic Activity:  Hallway walk with normal gait/ marching gait aprox 280 ft.  Good balance/ hip stability.    Nautilus resisted gait: 110# forward/ backwards 6x and 95# L/R lateral.    TRX: squats 20x/ lateral lunges 15x to L/R.    Standing on Airex in // bars with cone taps/ NBOS/ EC/ EO/ tandem stance.    PATIENT EDUCATION:  Education details: Pt and spouse of pt educated on PT prognosis and POC Person educated: Patient and Spouse Education method: Explanation and Verbal cues Education comprehension: verbalized understanding  HOME EXERCISE PROGRAM: Access Code: [email or web address] URL: [email or web address] Date: 03/29/2024 Prepared by: Ozell Sero Exercises - Standing Hip Extension with Ankle Weight - 1 x daily - 3-5 x weekly - 2 sets - 15 reps - Standing Hip Abduction AROM - 1 x daily - 3-5 x weekly - 2 sets - 15 reps - Sit to Stand Without Arm Support - 1 x daily - 3-5 x weekly - 2 sets - 10 reps - Seated Long Arc Quad - 1 x daily - 3-5 x weekly - 2 sets - 10 reps - Heel Raises with Counter Support - 1 x daily - 3-5 x weekly - 2 sets - 15 reps    Access Code: [email or web address] URL: [email or web address] Date:  04/17/2024  Prepared by: Ozell Sero  Exercises - Standing Marching  - 1 x daily - 3-5 x weekly - 2 sets - 15 reps - Standing Hip Extension with Ankle Weight  - 1 x daily - 3-5 x weekly - 2 sets - 15 reps - Standing Hip Abduction AROM  - 1 x daily - 3-5 x weekly - 2 sets - 15 reps - Heel Raises with Counter Support  - 1 x daily - 3-5 x weekly - 2 sets - 15 reps - Sit to Stand Without Arm Support  - 1 x daily - 3-5 x weekly - 2 sets - 10 reps - Seated Long Arc Quad  - 1 x daily - 3-5 x weekly - 2 sets - 10 reps  ASSESSMENT:  CLINICAL IMPRESSION: Pt works hard on exercise and has no pain with activity.  Pt ambulates with normalized gait pattern and continues to show improvement in overall endurance with less frequent rest breaks. Pts balance is improving and no LOB or deficits noted today.  Pt has progressed well towards all PT goals.  Discharge from skilled PT at this time with focus on a daily walking/ HEP.     OBJECTIVE IMPAIRMENTS: Abnormal gait, decreased balance, decreased endurance, decreased knowledge of condition, decreased safety awareness, and postural dysfunction.   ACTIVITY LIMITATIONS: carrying, lifting, standing, squatting, sleeping, stairs, transfers, and locomotion level  PARTICIPATION LIMITATIONS: community activity and occupation  PERSONAL FACTORS: Fitness and 3+ comorbidities: anxiety/depression, Parkinson's disease, HTN, Diabetes, hx of CA, and peripheral neuropathy are also affecting patient's functional outcome.   REHAB POTENTIAL: Good  CLINICAL DECISION MAKING: Evolving/moderate complexity  EVALUATION COMPLEXITY: Moderate   GOALS: Goals reviewed with patient? Yes  LONG TERM GOALS: Target date: 06/21/2024  Pt will increase ABC scale score to at least a 68% so that he experiences an improvement in self-confidence with balance related activities.  Baseline: 56.88%.  12/8: 95%  Goal status: Goal met  2.  Pt will improve FGA score to at least a 23/30 so that  patient will experience an improvement in dynamic balance and functional tasks with greater ease.   Baseline: 19/30  Goal status: On-going  3.  Pt will improve distance by at least 70 ft without the use of an assistive device to improve ability to ambulate community distances safely.   Baseline: 1050 ft  12/15: 1,152 feet (no c/o fatigue today).  Goal status: Goal met  4.  Pt. Independent with HEP to increase B shoulder flexion/ abduction to 4+/5 MMT to improve carrying/ lifting tasks with no pain.    Baseline: see above  2/4: HEP issued   Goal status:  Partially met  5.  Pt. Able to complete 45 minutes of there.ex. with no increase c/o pain or generalized muscle fatigue to improve functional mobility with daily activities/ ADL.      Baseline: see above    Goal status: Partially met  PLAN:  PT FREQUENCY: 1x/week  PT DURATION: 6 weeks  PLANNED INTERVENTIONS: 97164- PT Re-evaluation, 97110-Therapeutic exercises, 97530- Therapeutic activity, 97112- Neuromuscular re-education, 97535- Self Care, [id number]- Manual therapy, Patient/Family education, Balance training, Stair training, Cryotherapy, and Moist heat  PLAN FOR NEXT SESSION: Pt. Instructed to contact PT if any questions or concerns  Ozell JAYSON Sero, PT, DPT # [phone] 06/14/2024, 9:44 AM  "

## 2024-06-15 ENCOUNTER — Ambulatory Visit: Admission: RE | Admit: 2024-06-15 | Discharge: 2024-06-15 | Disposition: A | Source: Ambulatory Visit

## 2024-06-15 DIAGNOSIS — R945 Abnormal results of liver function studies: Secondary | ICD-10-CM

## 2024-06-22 ENCOUNTER — Ambulatory Visit: Admitting: Psychiatry

## 2024-09-19 ENCOUNTER — Inpatient Hospital Stay: Admitting: Oncology

## 2024-09-19 ENCOUNTER — Inpatient Hospital Stay
